# Patient Record
Sex: Female | Born: 1960 | Race: White | Hispanic: No | Marital: Married | Smoking: Never smoker
Health system: Southern US, Community
[De-identification: ages and names within clinical notes are randomized; demographics above are authoritative.]

## PROBLEM LIST (undated history)

## (undated) DIAGNOSIS — M1990 Unspecified osteoarthritis, unspecified site: Secondary | ICD-10-CM

## (undated) DIAGNOSIS — M797 Fibromyalgia: Secondary | ICD-10-CM

## (undated) DIAGNOSIS — R0609 Other forms of dyspnea: Secondary | ICD-10-CM

## (undated) DIAGNOSIS — E785 Hyperlipidemia, unspecified: Secondary | ICD-10-CM

## (undated) DIAGNOSIS — G4733 Obstructive sleep apnea (adult) (pediatric): Secondary | ICD-10-CM

## (undated) DIAGNOSIS — Z9289 Personal history of other medical treatment: Secondary | ICD-10-CM

## (undated) DIAGNOSIS — I4719 Other supraventricular tachycardia: Secondary | ICD-10-CM

## (undated) DIAGNOSIS — I4892 Unspecified atrial flutter: Secondary | ICD-10-CM

## (undated) DIAGNOSIS — T8859XA Other complications of anesthesia, initial encounter: Secondary | ICD-10-CM

## (undated) DIAGNOSIS — I10 Essential (primary) hypertension: Secondary | ICD-10-CM

## (undated) DIAGNOSIS — IMO0002 Reserved for concepts with insufficient information to code with codable children: Secondary | ICD-10-CM

## (undated) DIAGNOSIS — I471 Supraventricular tachycardia: Secondary | ICD-10-CM

## (undated) DIAGNOSIS — Q7962 Hypermobile Ehlers-Danlos syndrome: Secondary | ICD-10-CM

## (undated) DIAGNOSIS — I2510 Atherosclerotic heart disease of native coronary artery without angina pectoris: Secondary | ICD-10-CM

## (undated) DIAGNOSIS — T4145XA Adverse effect of unspecified anesthetic, initial encounter: Secondary | ICD-10-CM

## (undated) DIAGNOSIS — D649 Anemia, unspecified: Secondary | ICD-10-CM

## (undated) HISTORY — PX: EYE SURGERY: SHX253

## (undated) HISTORY — DX: Supraventricular tachycardia: I47.1

## (undated) HISTORY — DX: Reserved for concepts with insufficient information to code with codable children: IMO0002

## (undated) HISTORY — PX: DIAGNOSTIC LAPAROSCOPY: SUR761

## (undated) HISTORY — DX: Obstructive sleep apnea (adult) (pediatric): G47.33

## (undated) HISTORY — DX: Other forms of dyspnea: R06.09

## (undated) HISTORY — PX: KNEE ARTHROPLASTY: SHX992

## (undated) HISTORY — DX: Hyperlipidemia, unspecified: E78.5

## (undated) HISTORY — DX: Unspecified osteoarthritis, unspecified site: M19.90

## (undated) HISTORY — DX: Atherosclerotic heart disease of native coronary artery without angina pectoris: I25.10

## (undated) HISTORY — DX: Other supraventricular tachycardia: I47.19

## (undated) HISTORY — PX: OSTEOTOMY: SHX137

## (undated) HISTORY — DX: Essential (primary) hypertension: I10

## (undated) HISTORY — DX: Hypermobile Ehlers-Danlos syndrome: Q79.62

## (undated) HISTORY — PX: JOINT REPLACEMENT: SHX530

## (undated) HISTORY — PX: BACK SURGERY: SHX140

---

## 1998-08-16 ENCOUNTER — Ambulatory Visit (HOSPITAL_BASED_OUTPATIENT_CLINIC_OR_DEPARTMENT_OTHER): Admission: RE | Admit: 1998-08-16 | Discharge: 1998-08-16 | Payer: Self-pay | Admitting: Orthopedic Surgery

## 1998-10-04 ENCOUNTER — Encounter: Payer: Self-pay | Admitting: Neurosurgery

## 1998-10-04 ENCOUNTER — Ambulatory Visit (HOSPITAL_COMMUNITY): Admission: RE | Admit: 1998-10-04 | Discharge: 1998-10-04 | Payer: Self-pay | Admitting: Neurosurgery

## 1998-10-05 ENCOUNTER — Encounter: Payer: Self-pay | Admitting: Neurosurgery

## 1998-10-10 ENCOUNTER — Encounter: Payer: Self-pay | Admitting: Neurosurgery

## 1998-10-11 ENCOUNTER — Encounter: Payer: Self-pay | Admitting: Neurosurgery

## 1998-10-11 ENCOUNTER — Inpatient Hospital Stay (HOSPITAL_COMMUNITY): Admission: RE | Admit: 1998-10-11 | Discharge: 1998-10-12 | Payer: Self-pay | Admitting: Neurosurgery

## 1999-01-23 ENCOUNTER — Other Ambulatory Visit: Admission: RE | Admit: 1999-01-23 | Discharge: 1999-01-23 | Payer: Self-pay | Admitting: Internal Medicine

## 1999-04-01 ENCOUNTER — Ambulatory Visit (HOSPITAL_COMMUNITY): Admission: RE | Admit: 1999-04-01 | Discharge: 1999-04-01 | Payer: Self-pay | Admitting: Neurosurgery

## 1999-04-01 ENCOUNTER — Encounter: Payer: Self-pay | Admitting: Neurosurgery

## 1999-06-15 ENCOUNTER — Encounter: Payer: Self-pay | Admitting: Neurosurgery

## 1999-06-15 ENCOUNTER — Encounter: Admission: RE | Admit: 1999-06-15 | Discharge: 1999-06-15 | Payer: Self-pay | Admitting: Neurosurgery

## 1999-10-06 ENCOUNTER — Encounter: Admission: RE | Admit: 1999-10-06 | Discharge: 1999-10-27 | Payer: Self-pay | Admitting: *Deleted

## 2000-01-04 ENCOUNTER — Encounter: Payer: Self-pay | Admitting: Rheumatology

## 2000-01-04 ENCOUNTER — Ambulatory Visit (HOSPITAL_COMMUNITY): Admission: RE | Admit: 2000-01-04 | Discharge: 2000-01-04 | Payer: Self-pay | Admitting: *Deleted

## 2000-02-20 ENCOUNTER — Encounter: Payer: Self-pay | Admitting: Obstetrics and Gynecology

## 2000-02-20 ENCOUNTER — Encounter: Admission: RE | Admit: 2000-02-20 | Discharge: 2000-02-20 | Payer: Self-pay | Admitting: Obstetrics and Gynecology

## 2000-02-29 ENCOUNTER — Encounter: Admission: RE | Admit: 2000-02-29 | Discharge: 2000-02-29 | Payer: Self-pay | Admitting: Neurosurgery

## 2000-02-29 ENCOUNTER — Encounter: Payer: Self-pay | Admitting: Neurosurgery

## 2001-03-18 ENCOUNTER — Encounter: Admission: RE | Admit: 2001-03-18 | Discharge: 2001-03-18 | Payer: Self-pay | Admitting: Obstetrics and Gynecology

## 2001-03-18 ENCOUNTER — Encounter: Payer: Self-pay | Admitting: Obstetrics and Gynecology

## 2002-04-03 ENCOUNTER — Encounter: Admission: RE | Admit: 2002-04-03 | Discharge: 2002-04-03 | Payer: Self-pay | Admitting: Obstetrics and Gynecology

## 2002-04-03 ENCOUNTER — Encounter: Payer: Self-pay | Admitting: Obstetrics and Gynecology

## 2002-04-17 ENCOUNTER — Encounter: Payer: Self-pay | Admitting: Rheumatology

## 2002-04-17 ENCOUNTER — Ambulatory Visit (HOSPITAL_COMMUNITY): Admission: RE | Admit: 2002-04-17 | Discharge: 2002-04-17 | Payer: Self-pay | Admitting: Rheumatology

## 2002-05-05 ENCOUNTER — Ambulatory Visit: Admission: RE | Admit: 2002-05-05 | Discharge: 2002-05-05 | Payer: Self-pay | Admitting: Pulmonary Disease

## 2002-05-21 ENCOUNTER — Ambulatory Visit (HOSPITAL_BASED_OUTPATIENT_CLINIC_OR_DEPARTMENT_OTHER): Admission: RE | Admit: 2002-05-21 | Discharge: 2002-05-21 | Payer: Self-pay | Admitting: Obstetrics and Gynecology

## 2002-05-21 ENCOUNTER — Encounter (INDEPENDENT_AMBULATORY_CARE_PROVIDER_SITE_OTHER): Payer: Self-pay | Admitting: Specialist

## 2002-10-13 ENCOUNTER — Ambulatory Visit (HOSPITAL_COMMUNITY): Admission: RE | Admit: 2002-10-13 | Discharge: 2002-10-13 | Payer: Self-pay | Admitting: Neurosurgery

## 2002-10-13 ENCOUNTER — Encounter: Payer: Self-pay | Admitting: Neurosurgery

## 2003-04-07 ENCOUNTER — Encounter: Admission: RE | Admit: 2003-04-07 | Discharge: 2003-04-07 | Payer: Self-pay | Admitting: Obstetrics and Gynecology

## 2003-04-07 ENCOUNTER — Encounter: Payer: Self-pay | Admitting: Obstetrics and Gynecology

## 2003-06-07 ENCOUNTER — Encounter: Admission: RE | Admit: 2003-06-07 | Discharge: 2003-06-07 | Payer: Self-pay | Admitting: Infectious Diseases

## 2003-06-08 ENCOUNTER — Encounter: Admission: RE | Admit: 2003-06-08 | Discharge: 2003-06-08 | Payer: Self-pay | Admitting: Infectious Diseases

## 2003-07-22 ENCOUNTER — Ambulatory Visit (HOSPITAL_COMMUNITY): Admission: RE | Admit: 2003-07-22 | Discharge: 2003-07-22 | Payer: Self-pay

## 2003-08-19 ENCOUNTER — Encounter: Admission: RE | Admit: 2003-08-19 | Discharge: 2003-08-19 | Payer: Self-pay | Admitting: Neurological Surgery

## 2004-01-11 ENCOUNTER — Inpatient Hospital Stay (HOSPITAL_COMMUNITY): Admission: RE | Admit: 2004-01-11 | Discharge: 2004-01-19 | Payer: Self-pay | Admitting: Neurological Surgery

## 2004-05-22 ENCOUNTER — Encounter: Admission: RE | Admit: 2004-05-22 | Discharge: 2004-05-22 | Payer: Self-pay | Admitting: Obstetrics and Gynecology

## 2005-01-15 ENCOUNTER — Inpatient Hospital Stay (HOSPITAL_COMMUNITY): Admission: RE | Admit: 2005-01-15 | Discharge: 2005-01-17 | Payer: Self-pay | Admitting: Orthopedic Surgery

## 2005-02-12 ENCOUNTER — Encounter: Admission: RE | Admit: 2005-02-12 | Discharge: 2005-02-12 | Payer: Self-pay | Admitting: Neurological Surgery

## 2005-03-01 ENCOUNTER — Encounter: Admission: RE | Admit: 2005-03-01 | Discharge: 2005-03-01 | Payer: Self-pay | Admitting: Neurological Surgery

## 2005-05-21 ENCOUNTER — Encounter: Admission: RE | Admit: 2005-05-21 | Discharge: 2005-05-21 | Payer: Self-pay | Admitting: Neurological Surgery

## 2005-06-01 ENCOUNTER — Encounter: Admission: RE | Admit: 2005-06-01 | Discharge: 2005-06-01 | Payer: Self-pay | Admitting: Neurological Surgery

## 2005-06-27 ENCOUNTER — Encounter: Admission: RE | Admit: 2005-06-27 | Discharge: 2005-06-27 | Payer: Self-pay | Admitting: Neurological Surgery

## 2005-07-11 ENCOUNTER — Encounter: Admission: RE | Admit: 2005-07-11 | Discharge: 2005-07-11 | Payer: Self-pay | Admitting: Obstetrics and Gynecology

## 2005-07-19 ENCOUNTER — Encounter: Admission: RE | Admit: 2005-07-19 | Discharge: 2005-07-19 | Payer: Self-pay | Admitting: Neurological Surgery

## 2006-02-11 ENCOUNTER — Encounter: Admission: RE | Admit: 2006-02-11 | Discharge: 2006-02-11 | Payer: Self-pay | Admitting: Orthopedic Surgery

## 2006-02-26 ENCOUNTER — Encounter: Admission: RE | Admit: 2006-02-26 | Discharge: 2006-02-26 | Payer: Self-pay | Admitting: Neurological Surgery

## 2006-05-14 ENCOUNTER — Encounter: Payer: Self-pay | Admitting: Cardiovascular Disease

## 2006-07-23 ENCOUNTER — Encounter: Admission: RE | Admit: 2006-07-23 | Discharge: 2006-07-23 | Payer: Self-pay | Admitting: Obstetrics and Gynecology

## 2006-08-06 ENCOUNTER — Encounter: Admission: RE | Admit: 2006-08-06 | Discharge: 2006-08-06 | Payer: Self-pay | Admitting: Neurological Surgery

## 2007-03-04 ENCOUNTER — Encounter: Admission: RE | Admit: 2007-03-04 | Discharge: 2007-03-04 | Payer: Self-pay | Admitting: Neurological Surgery

## 2007-03-21 ENCOUNTER — Encounter: Admission: RE | Admit: 2007-03-21 | Discharge: 2007-03-21 | Payer: Self-pay | Admitting: Gastroenterology

## 2007-07-18 ENCOUNTER — Ambulatory Visit (HOSPITAL_BASED_OUTPATIENT_CLINIC_OR_DEPARTMENT_OTHER): Admission: RE | Admit: 2007-07-18 | Discharge: 2007-07-18 | Payer: Self-pay | Admitting: Obstetrics and Gynecology

## 2007-07-18 ENCOUNTER — Encounter (INDEPENDENT_AMBULATORY_CARE_PROVIDER_SITE_OTHER): Payer: Self-pay | Admitting: Obstetrics and Gynecology

## 2007-08-07 ENCOUNTER — Encounter: Admission: RE | Admit: 2007-08-07 | Discharge: 2007-08-07 | Payer: Self-pay | Admitting: Obstetrics and Gynecology

## 2007-12-02 ENCOUNTER — Encounter: Admission: RE | Admit: 2007-12-02 | Discharge: 2007-12-02 | Payer: Self-pay | Admitting: Neurological Surgery

## 2007-12-26 ENCOUNTER — Encounter: Admission: RE | Admit: 2007-12-26 | Discharge: 2007-12-26 | Payer: Self-pay | Admitting: Neurological Surgery

## 2008-01-16 ENCOUNTER — Encounter: Admission: RE | Admit: 2008-01-16 | Discharge: 2008-01-16 | Payer: Self-pay | Admitting: Neurological Surgery

## 2008-02-18 ENCOUNTER — Encounter: Admission: RE | Admit: 2008-02-18 | Discharge: 2008-02-18 | Payer: Self-pay | Admitting: Neurological Surgery

## 2008-06-11 ENCOUNTER — Encounter: Admission: RE | Admit: 2008-06-11 | Discharge: 2008-06-11 | Payer: Self-pay | Admitting: Neurological Surgery

## 2008-08-20 ENCOUNTER — Encounter: Admission: RE | Admit: 2008-08-20 | Discharge: 2008-08-20 | Payer: Self-pay | Admitting: Obstetrics and Gynecology

## 2008-10-17 ENCOUNTER — Emergency Department (HOSPITAL_COMMUNITY): Admission: EM | Admit: 2008-10-17 | Discharge: 2008-10-17 | Payer: Self-pay | Admitting: Emergency Medicine

## 2008-11-05 ENCOUNTER — Ambulatory Visit (HOSPITAL_COMMUNITY): Admission: RE | Admit: 2008-11-05 | Discharge: 2008-11-05 | Payer: Self-pay | Admitting: Gastroenterology

## 2009-01-18 ENCOUNTER — Encounter: Admission: RE | Admit: 2009-01-18 | Discharge: 2009-01-18 | Payer: Self-pay | Admitting: Orthopedic Surgery

## 2009-08-25 ENCOUNTER — Encounter: Admission: RE | Admit: 2009-08-25 | Discharge: 2009-08-25 | Payer: Self-pay | Admitting: Obstetrics and Gynecology

## 2010-07-29 ENCOUNTER — Encounter: Payer: Self-pay | Admitting: Neurological Surgery

## 2010-07-30 ENCOUNTER — Encounter: Payer: Self-pay | Admitting: Diagnostic Radiology

## 2010-07-30 ENCOUNTER — Encounter: Payer: Self-pay | Admitting: Obstetrics and Gynecology

## 2010-07-30 ENCOUNTER — Encounter: Payer: Self-pay | Admitting: Orthopedic Surgery

## 2010-07-30 ENCOUNTER — Encounter: Payer: Self-pay | Admitting: Neurological Surgery

## 2010-09-21 ENCOUNTER — Other Ambulatory Visit: Payer: Self-pay | Admitting: Obstetrics and Gynecology

## 2010-09-21 DIAGNOSIS — Z1231 Encounter for screening mammogram for malignant neoplasm of breast: Secondary | ICD-10-CM

## 2010-09-29 ENCOUNTER — Ambulatory Visit: Payer: Self-pay

## 2010-10-06 ENCOUNTER — Ambulatory Visit
Admission: RE | Admit: 2010-10-06 | Discharge: 2010-10-06 | Disposition: A | Discharge status: Home / Self Care | Payer: BC Managed Care – PPO | Source: Ambulatory Visit | Attending: Obstetrics and Gynecology | Admitting: Obstetrics and Gynecology

## 2010-10-06 DIAGNOSIS — Z1231 Encounter for screening mammogram for malignant neoplasm of breast: Secondary | ICD-10-CM

## 2010-10-11 ENCOUNTER — Ambulatory Visit: Payer: Self-pay | Admitting: Sports Medicine

## 2010-10-11 ENCOUNTER — Other Ambulatory Visit: Payer: Self-pay | Admitting: Obstetrics and Gynecology

## 2010-10-11 DIAGNOSIS — R928 Other abnormal and inconclusive findings on diagnostic imaging of breast: Secondary | ICD-10-CM

## 2010-10-12 ENCOUNTER — Encounter: Payer: Self-pay | Admitting: Sports Medicine

## 2010-10-12 ENCOUNTER — Ambulatory Visit
Admission: RE | Admit: 2010-10-12 | Discharge: 2010-10-12 | Disposition: A | Discharge status: Home / Self Care | Payer: BC Managed Care – PPO | Source: Ambulatory Visit | Attending: Obstetrics and Gynecology | Admitting: Obstetrics and Gynecology

## 2010-10-12 ENCOUNTER — Ambulatory Visit (INDEPENDENT_AMBULATORY_CARE_PROVIDER_SITE_OTHER): Payer: BC Managed Care – PPO | Admitting: Sports Medicine

## 2010-10-12 VITALS — BP 129/81 | Ht 66.0 in | Wt 130.0 lb

## 2010-10-12 DIAGNOSIS — R928 Other abnormal and inconclusive findings on diagnostic imaging of breast: Secondary | ICD-10-CM

## 2010-10-12 DIAGNOSIS — M7711 Lateral epicondylitis, right elbow: Secondary | ICD-10-CM | POA: Insufficient documentation

## 2010-10-12 DIAGNOSIS — M25521 Pain in right elbow: Secondary | ICD-10-CM

## 2010-10-12 DIAGNOSIS — M25529 Pain in unspecified elbow: Secondary | ICD-10-CM

## 2010-10-12 DIAGNOSIS — M159 Polyosteoarthritis, unspecified: Secondary | ICD-10-CM | POA: Insufficient documentation

## 2010-10-12 DIAGNOSIS — M7710 Lateral epicondylitis, unspecified elbow: Secondary | ICD-10-CM

## 2010-10-12 MED ORDER — NITROGLYCERIN 0.2 MG/HR TD PT24: MEDICATED_PATCH | TRANSDERMAL | Status: DC

## 2010-10-12 NOTE — Assessment & Plan Note (Signed)
Patient was given a right elbow compression sleeve that made her lateral epicondyles feel more comfortable. In addition she is an excellent Candidate for nitroglycerin therapy.  We will begin with a quarter of a patch of nitroglycerin daily - usel 24 hours and then replaced the next day.  I will ask her to return in 4 weeks and repeat the ultrasound. The key change in the first 4-6 weeks should be the amount of Doppler activity.  She was given gentle eccentric exercises to do for her wrist extensor tendons.  Carbon copy of this to Dr. Teressa Senter

## 2010-10-12 NOTE — Progress Notes (Signed)
  Subjective:    Patient ID: Nicole Oneill, female    DOB: Jul 01, 1961, 50 y.o.   MRN: [id number]  HPI Referred courtesy of Dr Teressa Senter who felt she might be a candidate for NTG therapy for her tennis elbow type injury  Here for RT elbow pain x 10 mos Started p IPAD purchased No specific injury. Triggered the elbow pain. Dr Marylene Buerger has worked on this with ART which helped forearm spasm  Patient has had generalized DJD since age 20 First knee surgery meniscus RT at age 86 Tibial osteotomies in 2008 Hemi knee left in 2005 by Dr Despina Hick  On Monday she had both CMC joints injected by Dr Teressa Senter Neg elbow xray at Dr syphers  Back surgery multiple levels at [facility] - lumbar spinal stenosis  cervical fusion Dr Amada Jupiter later fused C5-7 p this one failed L3-5 fused elsner 2009 failed again and Dr Theodora Blow and refused L2 to S1  Carpal tunnel injection on left 2 mos ago   Review of Systems Carries a diagnosis of Sjogren's     Objective:   Physical Exam Pleasant female in no acute distress.  Right elbow shows full range of motion. There is direct tenderness over the insertion of the extensor tendons into the lateral epicondyle. There is no visible swelling in this area. The triceps tendon seems nontender. She is unable to hold a book with her arm extended without pain she can do this easily with the left arm however Resisted extension of the right wrist or the right fingers also brings out the pain.  [facility] ultrasound After insertion of the tendons into the tip of the lateral epicondyle there are some small avulsion fragments and calcification. In the extensor tendons itself there is some hyperechoic change and what looks like a linear split of the tendon. This is associated with a marked increase in neo-vessels. Longitudinal and transverse views confirm the findings.       Assessment & Plan:

## 2010-10-12 NOTE — Assessment & Plan Note (Signed)
She is followed by a number of physicians for her multiple problems with osteoarthritis. These involve the cervical and lumbar spine. She is status post fusions in both of these areas.  Dr. Teressa Senter sees her for her arthritis in the hands and particularly at the [facility] joints. She has shown excellent improvement with the recent injections given to these joints. Of interest is that this lessened her pain in her right elbow.  She has bilateral knee osteoarthritis and is followed at [facility] for that at the present time.

## 2010-10-18 LAB — URINALYSIS, ROUTINE W REFLEX MICROSCOPIC
Bilirubin Urine: NEGATIVE
Bilirubin Urine: NEGATIVE
Glucose, UA: NEGATIVE mg/dL
Glucose, UA: NEGATIVE mg/dL
Hgb urine dipstick: NEGATIVE
Hgb urine dipstick: NEGATIVE
Ketones, ur: NEGATIVE mg/dL
Ketones, ur: NEGATIVE mg/dL
Nitrite: NEGATIVE
Nitrite: NEGATIVE
Protein, ur: NEGATIVE mg/dL
Protein, ur: NEGATIVE mg/dL
Specific Gravity, Urine: 1.008 (ref 1.005–1.030)
Specific Gravity, Urine: 1.017 (ref 1.005–1.030)
Urobilinogen, UA: 0.2 mg/dL (ref 0.0–1.0)
Urobilinogen, UA: 0.2 mg/dL (ref 0.0–1.0)
pH: 6.5 (ref 5.0–8.0)
pH: 6.5 (ref 5.0–8.0)

## 2010-10-18 LAB — LIPASE, BLOOD: Lipase: 35 U/L (ref 11–59)

## 2010-10-18 LAB — COMPREHENSIVE METABOLIC PANEL
ALT: 14 U/L (ref 0–35)
AST: 17 U/L (ref 0–37)
Albumin: 3.5 g/dL (ref 3.5–5.2)
Alkaline Phosphatase: 34 U/L — ABNORMAL LOW (ref 39–117)
BUN: 15 mg/dL (ref 6–23)
CO2: 28 mEq/L (ref 19–32)
Calcium: 9.1 mg/dL (ref 8.4–10.5)
Chloride: 105 mEq/L (ref 96–112)
Creatinine, Ser: 0.72 mg/dL (ref 0.4–1.2)
GFR calc Af Amer: >60 mL/min (ref >60)
GFR calc non Af Amer: >60 mL/min (ref >60)
Glucose, Bld: 93 mg/dL (ref 70–99)
Potassium: 3.8 mEq/L (ref 3.5–5.1)
Sodium: 139 mEq/L (ref 135–145)
Total Bilirubin: 0.4 mg/dL (ref 0.3–1.2)
Total Protein: 6.2 g/dL (ref 6.0–8.3)

## 2010-10-18 LAB — URINE MICROSCOPIC-ADD ON

## 2010-10-18 LAB — DIFFERENTIAL
Basophils Absolute: 0 10*3/uL (ref 0.0–0.1)
Basophils Relative: 0 % (ref 0–1)
Eosinophils Absolute: 0.1 10*3/uL (ref 0.0–0.7)
Eosinophils Relative: 2 % (ref 0–5)
Lymphocytes Relative: 33 % (ref 12–46)
Lymphs Abs: 1.4 10*3/uL (ref 0.7–4.0)
Monocytes Absolute: 0.4 10*3/uL (ref 0.1–1.0)
Monocytes Relative: 9 % (ref 3–12)
Neutro Abs: 2.4 10*3/uL (ref 1.7–7.7)
Neutrophils Relative %: 56 % (ref 43–77)

## 2010-10-18 LAB — CBC
HCT: 33.2 % — ABNORMAL LOW (ref 36.0–46.0)
Hemoglobin: 11.5 g/dL — ABNORMAL LOW (ref 12.0–15.0)
MCHC: 34.6 g/dL (ref 30.0–36.0)
MCV: 87.9 fL (ref 78.0–100.0)
Platelets: 179 10*3/uL (ref 150–400)
RBC: 3.78 MIL/uL — ABNORMAL LOW (ref 3.87–5.11)
RDW: 12.2 % (ref 11.5–15.5)
WBC: 4.3 10*3/uL (ref 4.0–10.5)

## 2010-11-21 ENCOUNTER — Other Ambulatory Visit: Payer: Self-pay | Admitting: Orthopedic Surgery

## 2010-11-21 DIAGNOSIS — M545 Low back pain, unspecified: Secondary | ICD-10-CM

## 2010-11-21 NOTE — Op Note (Signed)
NAME:  Nicole Oneill, Nicole Oneill                  ACCOUNT NO.:  [id number]   MEDICAL RECORD NO.:  [id number]          PATIENT TYPE:  AMB   LOCATION:  [facility]                         FACILITY:  [facility]   PHYSICIAN:  Jamison Neighbor, M.D.  DATE OF BIRTH:  [birth year]   DATE OF PROCEDURE:  07/18/2007  DATE OF DISCHARGE:                               OPERATIVE REPORT   SERVICE:  Urology.   PREOPERATIVE DIAGNOSIS:  Chronic pelvic pain, possible interstitial  cystitis.   POSTOPERATIVE DIAGNOSIS:  Chronic pelvic pain, possible interstitial  cystitis.   PROCEDURE:  1. Cystoscopy.  2. Urethral calibration.  3. Hydrodistention of the bladder.  4. Marcaine and Pyridium installation.  5. Marcaine and Kenalog injection.  6. Bladder biopsy.   SECONDARY PROCEDURE:  Laparoscopy with fulguration of endometriosis by  Dr. Marcelle Overlie.   ANESTHESIA:  General.   COMPLICATIONS:  None.   DRAIN:  A 16-French Foley catheter to a leg bag.   HISTORY:  This 50 year old female has had longstanding problems with  urgency and frequency as well as some question of pelvic pain.  The  etiology of this remains unclear, as there was felt to be some evidence  of endometriosis as well as a possible evidence of interstitial  cystitis.   Another urologist saw her and felt that she had primarily overactive  bladder.  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  The patient  does have a history of fibromyalgia and Sjogren's syndrome, which raises  the index of suspicion for interstitial cystitis.  She is now undergoing  diagnostic laparoscopy for treatment of her endometriosis and will have  a simultaneous cystoscopy and hydrodistention to determine if she has  evidence of interstitial cystitis.  She understands that this is a  diagnostic test, but it may have some therapeutic  value, but there is no  guarantee that she will have any improvement in her symptoms if she does  have evidence of IC.  Full informed consent was obtained.   PROCEDURE:  After a successful induction of general anesthesia, the  patient was placed in the dorsal lithotomy position, prepped with  Betadine and draped in the usual sterile fashion.  Careful bimanual  examination revealed very minimal cystocele, reasonably good support for  her uterus and no rectocele or enterocele.  There were no masses on  bimanual exam.  The urethra was palpably normal without evidence of  stenosis or stricture.  The urethra was calibrated to 32-French with  female urethral sounds.  Cystoscopy was performed and the bladder was  carefully inspected; no tumors or stones could be seen.  Both ureteral  orifices were normal in configuration and location.  No squamous  metaplasia or other irregularities could be identified.   Hydrodistention of bladder was performed.  The bladder was distended at  a pressure of 100 cmH2O for 5 minutes.  When the bladder was drained,  there was minimal bleeding at the termination  of the drain-out cycle.  The patient had very minimal granulations and she had a bladder capacity  that was normal at 1350 mL; this compares to the average IC capacity of  1150 as determined by previous studies and is much better than the  average IC bladder capacity of 575.  The glomerulation was felt to be  fairly nonspecific and these findings would certainly suggest that the  patient does not have any classic form of interstitial cystitis.  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  The patient underwent bladder biopsy to  rule out carcinoma in situ and also for mast cell analysis.  The biopsy  site was cauterized.  The bladder was drained.  A mixture of Marcaine  and Pyridium  was left in the bladder.  Marcaine and Kenalog mixture was  injected periurethral for a pudendal block.  The patient tolerated the  procedure well.  She was sent to the recovery room with a Foley catheter  that was plugged; this will be opened up after approximately 1/2 hour.  The patient will be sent home with the Foley catheter in place; this was  done at her request because she knows that 14 previous times when she  had undergone surgical intervention, she has had urinary retention.  The  patient will be sent home with pain medication from Dr. Vincente Poli as well  as Pyridium Plus for a postoperative symptomatology in the bladder and  doxycycline for antibiotic coverage.  When the patient returns, we will  determine how best to treat her, based on her response.  If the patient  has no change whatsoever in her urinary symptoms with the  hydrodistention and if the biopsy does not show mass cells, we will most  likely treat this patient simply as a dysfunctional voiding patient with  biofeedback physical therapy and medications for the pelvic floor and  bladder.  If on the other hand she has any change in her symptoms  secondary to the hydrodistention, we may treat her as an IC patient,  even though her bladder capacity is normal.      Jamison Neighbor, M.D.  Electronically Signed     RJE/MEDQ  D:  07/18/2007  T:  07/18/2007  Job:  [id number]   cc:   Marcelino Duster L. Vincente Poli, M.D.  Fax: [phone]

## 2010-11-21 NOTE — Op Note (Signed)
NAME:  Nicole Oneill, Nicole Oneill                  ACCOUNT NO.:  [id number]   MEDICAL RECORD NO.:  [id number]          PATIENT TYPE:  AMB   LOCATION:  [facility]                         FACILITY:  [facility]   PHYSICIAN:  Michelle L. Grewal, M.D.DATE OF BIRTH:  [birth year]   DATE OF PROCEDURE:  07/18/2007  DATE OF DISCHARGE:                               OPERATIVE REPORT   PREOPERATIVE DIAGNOSIS:  Pelvic pain, endometriosis.   POSTOPERATIVE DIAGNOSIS:  Pelvic pain, endometriosis involving both  ovaries.   PROCEDURE:  Diagnostic laparoscopy, fulguration of endometriosis.   ANESTHESIA:  General.   FINDINGS:  Endometriosis involving both ovaries, one small brownish spot  in the right lower quadrant near the cecum.   DESCRIPTION OF PROCEDURE:  The patient was taken to the operating room.  She was intubated without difficulty.  She was prepped and draped in the  usual sterile fashion and catheter was used to empty the bladder.  A  uterine manipulator was inserted.  Attention was turned to the abdomen  and a small infraumbilical incision was made after local was  infiltrated.  The Veress needle was inserted and pneumoperitoneum was  performed.  The 11 mm trocar was inserted.  The patient was placed in  Trendelenburg position.  Under direct visualization using the  laparoscope a 5 mm trocar was inserted suprapubically.  I then performed  a thorough exploration of the pelvis.  The liver appeared normal.  Gallbladder normal.  She had one small hemosiderin brownish spot just  lateral to the liver but it was too close to the liver for me to  fulgurate.  Also, her cecum and colon appeared normal, however, I had to  retract immediately to visualize the appendix and the appendix did not  appear inflamed but there appeared to be maybe some small adhesions  around the base of the appendix.  Sometimes we can see this with  endometriosis.  I saw no obvious signs of endometriosis, and the patient  had no bowel prep prior to  this procedure, and was not consented for  laparoscopic appendectomy.  She is currently asymptomatic in that  regard.  I would recommend for the patient that if she needed further  surgery in the future, such as hysterectomy, she would need an  appendectomy performed by a general surgeon.  This will be discussed  with the patient and her family after surgery.  She did have one small  brownish hemosiderin deposit just lateral on the abdominal wall and I  did burn that.  I then went down to the pelvis.  The bladder flap was  clean.  The uterus was slightly enlarged consistent with adenomyosis.  There were no adhesions in the cul-de-sac but there was one small  peritoneal window in the cul-de-sac on the patient's right side.  Her  fallopian tubes appeared normal.  Both ovaries, however, were scattered  with about 8 to 9 spots of endometriosis on the surface.  There was no  endometriotic implants visualized in the peritoneum beneath the ovaries.  I used the Kleppinger's and burned all the visualized spots.  I felt  her  disease was very limited to the surface of the ovaries and felt  oophorectomy would not be necessary and the patient only wanted to have  oophorectomy if her disease was severe.  At this point I completed the  surgery and the pneumoperitoneum was released.  The trocars  were removed.  Incisions were closed with Dermabond skin adhesive.  The  uterine manipulator was removed.  I then scrubbed out and Dr. Logan Bores then  scrubbed in.  I discussed with him briefly the findings of my surgery  and he will perform his portion of the surgery, which will be dictated  by him.      Michelle L. Vincente Poli, M.D.  Electronically Signed     MLG/MEDQ  D:  07/18/2007  T:  07/18/2007  Job:  [id number]   cc:   Jamison Neighbor, M.D.

## 2010-11-23 ENCOUNTER — Encounter: Payer: Self-pay | Admitting: Sports Medicine

## 2010-11-23 ENCOUNTER — Ambulatory Visit (INDEPENDENT_AMBULATORY_CARE_PROVIDER_SITE_OTHER): Payer: BC Managed Care – PPO | Admitting: Sports Medicine

## 2010-11-23 VITALS — BP 125/82

## 2010-11-23 DIAGNOSIS — M7710 Lateral epicondylitis, unspecified elbow: Secondary | ICD-10-CM

## 2010-11-23 DIAGNOSIS — M159 Polyosteoarthritis, unspecified: Secondary | ICD-10-CM

## 2010-11-23 DIAGNOSIS — M7711 Lateral epicondylitis, right elbow: Secondary | ICD-10-CM

## 2010-11-23 NOTE — Assessment & Plan Note (Signed)
Musculoskeletal ultrasound Scanning of the Lateral epicondyle today reveals improved appearance of the tendon over all. There is less hypoechoic change. There continues to be a very small avulsion fragment at the very tip of the epicondyle. In the area where she had some tendon splitting and neovessel activity she now has essentially normal Doppler flow and there is less hypoechoic change at the tendon.  The patient is showing dramatic improvement clinically and by scanning with her epicondylitis. With this in mind I still think she should continue the nitroglycerin patches for an additional 6 weeks since all the studies were based on 12 weeks of treatment. Since exercises with any weight hurt she can keep doing motion exercises for wrist extension flexion and rotation and some gentle ball squeezes to try to strengthen the muscles around the epicondyle.   I will recheck this in 6-8 weeks and if continue to progress can release her at that time.  Cc: Josephine Igo.

## 2010-11-23 NOTE — Assessment & Plan Note (Signed)
I did a quick ultrasound scan of her CMC joints today. There is less fluid and less calcification noted than on her last visit. I encouraged her to continue her followup with Dr. Teressa Senter for this as it seems that both in terms of symptoms and based on appearance on ultrasound she did get a very good response to injection.

## 2010-11-23 NOTE — Progress Notes (Signed)
  Subjective:    Patient ID: Nicole Oneill, female    DOB: [birth year], 50 y.o.   MRN: [id number]  HPI  Patient was referred to me by Dr. Teressa Senter for possible nitroglycerin treatment for her right tennis elbow. Last visit we scanned this and found some changes on ultrasound that were very consistent with tennis elbow. She started using the nitroglycerin patches. She states she had some pain relief within 10 days. She now feels that she is at least 75% better and wonders if she needs the patches any longer.  She does find that the exercises are hard for her to do primarily because of some pain in her CMC joints and her wrists. Her CMC joints are much better since Dr. Teressa Senter injected them 6 weeks ago.  Review of Systems     Objective:   Physical Exam The patient is in no acute distress She has only mild tenderness to palpation at the tip of the lateral epicondyle  there is mild pain to resisted wrist extension Minimal pain to resisted finger extension She was able to hold a book in full extension with some tightness and mild pain.  Noted that she has some squaring of both first CMC joints but these are much less tender today       Assessment & Plan:

## 2010-11-24 ENCOUNTER — Ambulatory Visit
Admission: RE | Admit: 2010-11-24 | Discharge: 2010-11-24 | Disposition: A | Discharge status: Home / Self Care | Payer: BC Managed Care – PPO | Source: Ambulatory Visit | Attending: Orthopedic Surgery | Admitting: Orthopedic Surgery

## 2010-11-24 DIAGNOSIS — M545 Low back pain, unspecified: Secondary | ICD-10-CM

## 2010-11-24 NOTE — Discharge Summary (Signed)
NAME:  Nicole Oneill, Nicole Oneill NO.:  [id number]   MEDICAL RECORD NO.:  [id number]                   PATIENT TYPE:  INP   LOCATION:  3010                                 FACILITY:  [facility]   PHYSICIAN:  Stefani Dama, M.D.               DATE OF BIRTH:  03/07/1961   DATE OF ADMISSION:  01/11/2004  DATE OF DISCHARGE:  01/19/2004                                 DISCHARGE SUMMARY   ADMISSION DIAGNOSES:  1. Pseudoarthrosis C6, C7, left cervical radiculopathy.  2. Lumbar spondylosis and stenosis with recurrent herniated nucleus     pulposus, L4, L5.  Lumbar radiculopathy status post diskitis L4, L5.   DISCHARGE/FINAL DIAGNOSES:  1. Pseudoarthrosis C6, C7, left cervical radiculopathy.  2. Lumbar spondylosis and stenosis with recurrent herniated nucleus     pulposus, L4, L5.  Lumbar radiculopathy status post diskitis L4, L5.  3. Postoperative anemia.  4. Severe postoperative constipation.   CONDITION ON DISCHARGE:  Improving.   HOSPITAL COURSE:  [name] is a 50 year old individual who has had  significant back pain and bilateral leg pain.  She has had a minimally  invasive decompressive procedure at L4-5 about a year ago.  Several years  before that she has had an anterior cervical decompression arthrodesis.  She  did well for a while from her neck but developed significant neck, shoulder  and left arm pain and was found to have a pseudoarthrosis at the C6, C7  level.  In the meantime after her minimally invasive decompression she had  persistent pain and she developed a diskitis several weeks later.  She was  treated with a long course of IV antibiotics and gradually this resolved  however.  Pain in the back of both legs in her lower back did not resolve.  She was evaluated with a myelogram and was found to have a severe stenosis  at the L4, L5 level.  L3, L4 was also moderately stenotic.  After careful  consideration of the patient's options she was  advised regarding surgical  decompression and stabilization from L3 to L5 in her lumbar spine, and  revision of the arthrodesis at the C6, C7 via posterior supplementation.  She is now admitted to the hospital to undergo these procedures.  Postoperatively she seemed to tolerate the surgery well, however, during the  postoperative course it was noted that her blood sugars appeared to be  somewhat increased in the 160 to 170 range.  She remained tachycardic and a  CBC demonstrated that her hemoglobin decreased to 7.1 on the second  postoperative day; her hematocrit was 21.  She was feeling weak and  nauseated, in general pain was difficult to control with substantial doses  of parenteral pain medications.  She was transfused 2 units of blood on the  evening of January 13, 2004.  On January 14, 2004 the patient was still having  substantial difficulties with adequate pain control and was receiving quite  strong doses of Dilaudid.  Results are noted on the evening of January 14, 2004  that she was hyponatremic with a sodium of 127, a potassium of 3.9, her  glucose remained elevated at 149.  On January 15, 2004 consultation was obtained  with the Hospitalists to evaluation her hyponatremia.  It was felt that this  was due basically to fluid overload.  Her hemoglobin A1C returned as normal  at 5.3.  It is not felt that she was diabetic and the sugars came under good  spontaneous control.  As time passed her pain came under better control, it  was felt that this was related to fibromyalgia syndrome from which she  suffers.  The patient was maintained on oral pain medications and on January 16, 2004 it was noted that her speech was becoming more fluent, she was  becoming more appropriate.  However the patient's bowels had not moved.  On  January 17, 2004 ultimately she was treated with further strong laxatives and  ultimately required enema and disimpaction.  Her bowels resumed normal  function and by January 18, 2004 she  was feeling considerably better.  She was  maintained on IV antibiotics during the periprocedural for a total of 6  days.  Her intravenous  fluids were discontinued on January 17, 2004.  At the  current time the patient is tolerating ambulation with use of a walker; her  incisions are clean and dry in the neck and in the low back.  She will be  discharged home at the current time to be followed up on an outpatient  basis.   DISCHARGE MEDICATIONS:  1. She is given a prescription for Dilaudid 2 mg #60.  2. Valium 5 mg #40.  3. Flexeril to use as a muscle relaxant.   FOLLOW UP:  I will see her in two week's time to check her progress.                                                Stefani Dama, M.D.    Nicole Oneill  D:  01/18/2004  T:  01/19/2004  Job:  [id number]

## 2010-11-24 NOTE — H&P (Signed)
NAME:  Nicole Oneill, Nicole Oneill                  ACCOUNT NO.:  [id number]   MEDICAL RECORD NO.:  [id number]          PATIENT TYPE:  INP   LOCATION:  NA                           FACILITY:  [facility]   PHYSICIAN:  Ollen Gross, M.D.    DATE OF BIRTH:  [birth year]   DATE OF ADMISSION:  07/03/2006  DATE OF DISCHARGE:                              HISTORY & PHYSICAL   CHIEF COMPLAINT:  Left knee pain.   HISTORY OF PRESENT ILLNESS:  The patient is a 50 year old female, well  known to Dr. Ollen Gross and had previously undergone a left uni-knee  arthroplasty back in 2006.  Unfortunately, she has had problems with the  knee lately with increasing pain.  She has had a bone scan which is  positive for either loosening or possible stress reaction osteonecrosis  on the medial tibial.  There was concern that she was having reaction  there with increased pain.  It is felt she would best be served by  conversion over from a left uni-knee over to a left total knee.  The  risks and benefits discussed.  The patient subsequently admitted to the  hospital.   ALLERGIES:  NO KNOWN DRUG ALLERGIES.   INTOLERANCES:  Morphine causes sickness.   CURRENT MEDICATIONS:  Ambien CR, Zoloft, Zanaflex, Protonix, Mobic,  Chromagen, multivitamin, iron supplement, glucosamine/chondroitin focus  formula, calcium, and baby aspirin.   PAST MEDICAL HISTORY:  1. Sjogren's disease.  2. Fibromyalgia.  3. 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  4. Heart murmur.  5. History of constipation, requiring disimpaction in the past.  6. Reflux.  7. History of urinary retention.  8. Hyperglycemia.  9. History of anemia.  10.Mild scoliosis.  11.Cervical degenerative disk disease, lumbar degenerative disk      disease.  12.History of disk herniation L5-S1 requiring ESIs.  13.History of endometriosis.   PAST SURGICAL HISTORY:  1. Left uni-knee replacement, 2006.  2. Laparotomy secondary to  endometriosis, January 1996.  3. Right knee arthroscopy, 1998.  4. Left knee surgery, February 2000.  5. Cervical fusion, April 2000.  6. Left surgery microfracture in 2002.  7. Abdominal laparoscopy for endometriosis, November 2003.  8. Lumbar disk surgery, L3-L5, June 2004, with post-op staph infection      diskitis two weeks later.  9. Cervical fusion redo, C5-C7 and also lumbar surgery L3-L5, July      2005.   SOCIAL HISTORY:  Married, housewife, nonsmoker, 3-4 glasses per week, 3  children.   FAMILY HISTORY:  Father with a history of cancer who was deceased at age  82.  Mother with a history of heart disease.   REVIEW OF SYSTEMS:  GENERAL:  She had a recent low-grade fever,  questionable viral infection.  No chills or night sweats.  NEURO:  History of some sciatica, radicular pain from her previous back  surgeries, also fibromyalgia.  No seizures or syncope.  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  No  shortness of breath, productive cough, or hemoptysis.  CARDIOVASCULAR:  No chest pain, angina, orthopnea.  She does have a heart murmur.  GI:  No nausea, vomiting, diarrhea, or constipation.  GU:  No dysuria,  hematuria, discharge.  She does have a history of urinary retention.  MUSCULOSKELETAL:  Left knee.   PHYSICAL EXAMINATION:  VITAL SIGNS:  Pulse 80, respirations 14, blood  pressure 102/66.  GENERAL:  A 50 year old, white female, tall, thin framed, alert,  oriented, cooperative, a good historian.  HEENT:  Normocephalic, atraumatic.  Pupils round and reactive.  Oropharynx clear.  EOMs intact.  NECK:  Supple.  No carotid bruits.  CHEST:  Clear anterior posterior chest walls.  No rhonchi, rales, or  wheezing.  HEART:  Regular in rate and rhythm.  S1 and S2 noted.  ABDOMEN:  Soft, flat, and nontender.  Bowel sounds present.  RECTAL/BREASTS/GENITALIA:  Not done.  Not pertinent to present illness.  EXTREMITIES:  Left knee:  Range of  motion is 0-140, tender medially, no  effusion.   IMPRESSION:  Failed left uni-knee.   PLAN:  The patient will be admitted to [facility] to undergo  conversion of her left uni-knee over to a total knee replacement.  The  surgery will be performed by Dr. Ollen Gross.      Alexzandrew L. Julien Girt, P.A.      Ollen Gross, M.D.  Electronically Signed    ALP/MEDQ  D:  07/02/2006  T:  07/02/2006  Job:  [id number]   cc:   [name], M.D.  Fax: [phone]

## 2011-01-24 ENCOUNTER — Ambulatory Visit: Payer: BC Managed Care – PPO | Admitting: Sports Medicine

## 2011-02-05 ENCOUNTER — Ambulatory Visit (INDEPENDENT_AMBULATORY_CARE_PROVIDER_SITE_OTHER): Payer: BC Managed Care – PPO | Admitting: Sports Medicine

## 2011-02-05 VITALS — BP 105/72 | Ht 66.0 in | Wt 130.0 lb

## 2011-02-05 DIAGNOSIS — M722 Plantar fascial fibromatosis: Secondary | ICD-10-CM | POA: Insufficient documentation

## 2011-02-05 DIAGNOSIS — M159 Polyosteoarthritis, unspecified: Secondary | ICD-10-CM

## 2011-02-05 NOTE — Patient Instructions (Signed)
1. Do toe walks and backward walks without waits across the room 10 times daily.    2. Do theraband flexion and extension, 10 repititions daily.  3. Do foot stretch and massage.  4. Use the arch strap on the right foot daily.  5. Ice heel at the end of the day for 10 minutes.  6. Follow up after your knee replacement if you still have foot pain.

## 2011-02-05 NOTE — Assessment & Plan Note (Signed)
Currently her pain is in the right thumb.  The last injection did not improve her pain.  [facility] has no other therapies to offer.  She is referred to her PCM for a work up to investigate a possible joining etiology for her extensive arthritis.  She can try oral analgesics PRN for her pain.

## 2011-02-05 NOTE — Assessment & Plan Note (Addendum)
She will start with strengthening and stretching exercises and wear an arch support strap on the right.  If this does not improve her pain she will follow up after her surgeries are complete.  Wear shoes that have good our support whenever possible.  I would not consider making orthotics until after her knee replacement is complete as this might change her gait somewhat

## 2011-02-05 NOTE — Progress Notes (Signed)
  Subjective:    Patient ID: Nicole Oneill, female    DOB: 04-May-1961, 50 y.o.   MRN: [id number]  HPI 50 y/o female is here c/o bilateral foot pain R>L.  The left foot pain is worse with the first step of the day and resolves during the morning hours.  The right foot is also worse with the first step of the day, improves over the course of the day, but does not resolve.  She has had no injuries.  She notices that her sandals with midfoot support are helpful  She has extensive arthritis in multiple joints and is currently scheduled for a left knee replacement and a lumbar fusion.  She recently had an injection into the base of the thumb for pain and still has pain in that joint today.     Review of Systems     Objective:   Physical Exam  Right foot: Normal inspection with no visable or palpable fat pad atrophy and no visible swelling/erythema. Patient is tender at medial insertion of plantar fascia into calcaneus. Great toe motion: normal Arch shape: normal Other foot breakdown: Morton's foot, second toe longer than the great toe   Left foot: Normal inspection with no visable or palpable fat pad atrophy and no visible swelling/erythema. Patient is tender at medial insertion of plantar fascia into calcaneus. Great toe motion: normal Arch shape: some loss Other foot breakdown:Morton's foot, second toe longer than the great toe  Right hand: Atrophy of the thenar eminence Squaring of the CMC joint Subluxation of the MCP joint  Ultrasound:  Right foot: Plantar fascia and achilles tendon visualized in the long view.  Other than thickening of the plantar fascia no abnormalities were noted. Plantar fascia 0.6cm Achilles tendon 0.4cmi  Left foot: Normal appearing plantar fascia in the long view.  Plantar fascia 0.35cm  Right hand:  MCP joint shows significant arthritic changes       Assessment & Plan:   No problem-specific assessment & plan notes found for this encounter.

## 2011-03-16 ENCOUNTER — Other Ambulatory Visit: Payer: Self-pay | Admitting: Gastroenterology

## 2011-03-16 ENCOUNTER — Ambulatory Visit
Admission: RE | Admit: 2011-03-16 | Discharge: 2011-03-16 | Disposition: A | Discharge status: Home / Self Care | Payer: BC Managed Care – PPO | Source: Ambulatory Visit | Attending: Gastroenterology | Admitting: Gastroenterology

## 2011-03-29 LAB — CBC
HCT: 32 — ABNORMAL LOW
Hemoglobin: 11.3 — ABNORMAL LOW
MCHC: 35.3
MCV: 86
Platelets: 210
RBC: 3.72 — ABNORMAL LOW
RDW: 12
WBC: 5.1

## 2011-03-29 LAB — POCT PREGNANCY, URINE
Operator id: 114531
Preg Test, Ur: NEGATIVE

## 2011-04-11 ENCOUNTER — Other Ambulatory Visit: Payer: Self-pay | Admitting: Obstetrics and Gynecology

## 2011-04-11 DIAGNOSIS — N6489 Other specified disorders of breast: Secondary | ICD-10-CM

## 2011-04-26 ENCOUNTER — Ambulatory Visit
Admission: RE | Admit: 2011-04-26 | Discharge: 2011-04-26 | Disposition: A | Discharge status: Home / Self Care | Payer: BC Managed Care – PPO | Source: Ambulatory Visit | Attending: Obstetrics and Gynecology | Admitting: Obstetrics and Gynecology

## 2011-04-26 ENCOUNTER — Encounter (HOSPITAL_COMMUNITY): Payer: BC Managed Care – PPO

## 2011-04-26 ENCOUNTER — Other Ambulatory Visit: Payer: Self-pay | Admitting: Orthopedic Surgery

## 2011-04-26 DIAGNOSIS — N6489 Other specified disorders of breast: Secondary | ICD-10-CM

## 2011-04-26 LAB — COMPREHENSIVE METABOLIC PANEL
ALT: 10 U/L (ref 0–35)
AST: 13 U/L (ref 0–37)
Albumin: 4.4 g/dL (ref 3.5–5.2)
Alkaline Phosphatase: 91 U/L (ref 39–117)
BUN: 11 mg/dL (ref 6–23)
CO2: 31 mEq/L (ref 19–32)
Calcium: 9.9 mg/dL (ref 8.4–10.5)
Chloride: 100 mEq/L (ref 96–112)
Creatinine, Ser: 0.62 mg/dL (ref 0.50–1.10)
GFR calc Af Amer: >90 mL/min (ref >90)
GFR calc non Af Amer: >90 mL/min (ref >90)
Glucose, Bld: 99 mg/dL (ref 70–99)
Potassium: 4 mEq/L (ref 3.5–5.1)
Sodium: 140 mEq/L (ref 135–145)
Total Bilirubin: 0.3 mg/dL (ref 0.3–1.2)
Total Protein: 7.1 g/dL (ref 6.0–8.3)

## 2011-04-26 LAB — APTT: aPTT: 32 seconds (ref 24–37)

## 2011-04-26 LAB — CBC
HCT: 35.7 % — ABNORMAL LOW (ref 36.0–46.0)
Hemoglobin: 11.9 g/dL — ABNORMAL LOW (ref 12.0–15.0)
MCH: 27.9 pg (ref 26.0–34.0)
MCHC: 33.3 g/dL (ref 30.0–36.0)
MCV: 83.8 fL (ref 78.0–100.0)
Platelets: 206 10*3/uL (ref 150–400)
RBC: 4.26 MIL/uL (ref 3.87–5.11)
RDW: 13.3 % (ref 11.5–15.5)
WBC: 4.2 10*3/uL (ref 4.0–10.5)

## 2011-04-26 LAB — URINALYSIS, ROUTINE W REFLEX MICROSCOPIC
Bilirubin Urine: NEGATIVE
Glucose, UA: NEGATIVE mg/dL
Hgb urine dipstick: NEGATIVE
Ketones, ur: NEGATIVE mg/dL
Nitrite: NEGATIVE
Protein, ur: NEGATIVE mg/dL
Specific Gravity, Urine: 1.01 (ref 1.005–1.030)
Urobilinogen, UA: 0.2 mg/dL (ref 0.0–1.0)
pH: 7.5 (ref 5.0–8.0)

## 2011-04-26 LAB — SURGICAL PCR SCREEN
MRSA, PCR: NEGATIVE
Staphylococcus aureus: POSITIVE — AB

## 2011-04-26 LAB — URINE MICROSCOPIC-ADD ON

## 2011-04-26 LAB — PROTIME-INR
INR: 0.95 (ref 0.00–1.49)
Prothrombin Time: 12.9 seconds (ref 11.6–15.2)

## 2011-05-02 DIAGNOSIS — Y831 Surgical operation with implant of artificial internal device as the cause of abnormal reaction of the patient, or of later complication, without mention of misadventure at the time of the procedure: Secondary | ICD-10-CM | POA: Diagnosis present

## 2011-05-02 DIAGNOSIS — D62 Acute posthemorrhagic anemia: Secondary | ICD-10-CM | POA: Diagnosis not present

## 2011-05-02 DIAGNOSIS — Z96659 Presence of unspecified artificial knee joint: Secondary | ICD-10-CM

## 2011-05-02 DIAGNOSIS — Z01812 Encounter for preprocedural laboratory examination: Secondary | ICD-10-CM

## 2011-05-02 DIAGNOSIS — Z981 Arthrodesis status: Secondary | ICD-10-CM

## 2011-05-02 DIAGNOSIS — T84099A Other mechanical complication of unspecified internal joint prosthesis, initial encounter: Principal | ICD-10-CM | POA: Diagnosis present

## 2011-05-02 DIAGNOSIS — IMO0002 Reserved for concepts with insufficient information to code with codable children: Secondary | ICD-10-CM | POA: Diagnosis present

## 2011-05-02 DIAGNOSIS — Z01818 Encounter for other preprocedural examination: Secondary | ICD-10-CM

## 2011-05-02 DIAGNOSIS — M1710 Unilateral primary osteoarthritis, unspecified knee: Secondary | ICD-10-CM | POA: Diagnosis present

## 2011-05-02 DIAGNOSIS — R011 Cardiac murmur, unspecified: Secondary | ICD-10-CM | POA: Diagnosis present

## 2011-05-02 LAB — ABO/RH: ABO/RH(D): A NEG

## 2011-05-03 LAB — BASIC METABOLIC PANEL
BUN: 9 mg/dL (ref 6–23)
CO2: 33 mEq/L — ABNORMAL HIGH (ref 19–32)
Calcium: 8.8 mg/dL (ref 8.4–10.5)
Chloride: 99 mEq/L (ref 96–112)
Creatinine, Ser: 0.5 mg/dL (ref 0.50–1.10)
GFR calc Af Amer: >90 mL/min (ref >90)
GFR calc non Af Amer: >90 mL/min (ref >90)
Glucose, Bld: 130 mg/dL — ABNORMAL HIGH (ref 70–99)
Potassium: 4 mEq/L (ref 3.5–5.1)
Sodium: 137 mEq/L (ref 135–145)

## 2011-05-03 LAB — CBC
HCT: 23.3 % — ABNORMAL LOW (ref 36.0–46.0)
Hemoglobin: 7.6 g/dL — ABNORMAL LOW (ref 12.0–15.0)
MCH: 27.9 pg (ref 26.0–34.0)
MCHC: 32.6 g/dL (ref 30.0–36.0)
MCV: 85.7 fL (ref 78.0–100.0)
Platelets: 194 10*3/uL (ref 150–400)
RBC: 2.72 MIL/uL — ABNORMAL LOW (ref 3.87–5.11)
RDW: 13.7 % (ref 11.5–15.5)
WBC: 7.5 10*3/uL (ref 4.0–10.5)

## 2011-05-03 LAB — PREPARE RBC (CROSSMATCH)

## 2011-05-04 LAB — TYPE AND SCREEN
ABO/RH(D): A NEG
Antibody Screen: NEGATIVE
Unit division: 0
Unit division: 0

## 2011-05-04 LAB — BASIC METABOLIC PANEL
BUN: 10 mg/dL (ref 6–23)
CO2: 32 mEq/L (ref 19–32)
Calcium: 8.4 mg/dL (ref 8.4–10.5)
Chloride: 100 mEq/L (ref 96–112)
Creatinine, Ser: 0.52 mg/dL (ref 0.50–1.10)
GFR calc Af Amer: >90 mL/min (ref >90)
GFR calc non Af Amer: >90 mL/min (ref >90)
Glucose, Bld: 136 mg/dL — ABNORMAL HIGH (ref 70–99)
Potassium: 3.4 mEq/L — ABNORMAL LOW (ref 3.5–5.1)
Sodium: 137 mEq/L (ref 135–145)

## 2011-05-04 LAB — CBC
HCT: 27.3 % — ABNORMAL LOW (ref 36.0–46.0)
Hemoglobin: 9.1 g/dL — ABNORMAL LOW (ref 12.0–15.0)
MCH: 28.1 pg (ref 26.0–34.0)
MCHC: 33.3 g/dL (ref 30.0–36.0)
MCV: 84.3 fL (ref 78.0–100.0)
Platelets: 136 10*3/uL — ABNORMAL LOW (ref 150–400)
RBC: 3.24 MIL/uL — ABNORMAL LOW (ref 3.87–5.11)
RDW: 13.8 % (ref 11.5–15.5)
WBC: 5.5 10*3/uL (ref 4.0–10.5)

## 2011-05-04 NOTE — Op Note (Signed)
NAME:  Nicole Oneill, Nicole Oneill NO.:  [id number]  MEDICAL RECORD NO.:  [id number]  LOCATION:  0006                         FACILITY:  [facility]  PHYSICIAN:  Ollen Gross, M.D.    DATE OF BIRTH:  1960/10/21  DATE OF PROCEDURE:  05/02/2011 DATE OF DISCHARGE:                              OPERATIVE REPORT   PREOPERATIVE DIAGNOSIS:  Failed left knee unicompartmental replacement.  POSTOPERATIVE DIAGNOSIS:  Failed left knee unicompartmental replacement.  PROCEDURE:  Revision of left knee unicompartmental replacement to total knee arthroplasty.  SURGEON:  Ollen Gross, M.D.  ASSISTANT:  Alexzandrew L. Perkins, P.A.C.  ANESTHESIA:  General.  ESTIMATED BLOOD LOSS:  Minimal.  DRAINS:  Hemovac x1.  TOURNIQUET TIME:  46 minutes at 300 mmHg.  COMPLICATIONS:  None.  CONDITION:  Stable to recovery.  BRIEF CLINICAL NOTE:  Nicole Oneill is a 50 year old female with severe left knee pain and dysfunction.  She has had a previous unicompartmental replacement to the left knee.  She has had persistent pain and worsening function.  She has pain at all times.  She is at a stage now where she cannot do things she desires.  She even has pain at rest.  She presents now for revision of the uni to a total knee arthroplasty.  PROCEDURE IN DETAIL:  After successful administration of general anesthetic, a tourniquet was placed high on the left thigh, and left lower extremity was prepped and draped in the usual sterile fashion. Extremity was wrapped in Esmarch, knee flexed, tourniquet inflated to 300 mmHg.  Midline incision was made with a 10 blade through subcutaneous tissue to the level of the extensor mechanism.  Fresh blade was used to make a medial parapatellar arthrotomy.  Soft tissue on the proximal medial tibia subperiosteally elevated to the joint line with a knife and into the semimembranosus bursa with a Cobb elevator.  Soft tissue laterally was elevated with attention being paid to  avoid the patellar tendon on tibial tubercle.  Patella was everted.  She has severe arthritis in the patellofemoral compartment.  She also has degenerative change laterally.  I used a tiny osteotome to disrupt the interface between the femoral component of the unicompartmental replacement and the bone.  The femoral component was easily removed. Cement was then taken out of the femur.  A drill was used to create a starting hole in the distal femur and the canal was thoroughly irrigated.  A 5-degree left valgus alignment guide was placed and the distal femoral cutting block was pinned to remove 10 mm off the distal femoral lateral side.  Resection was made with an oscillating saw.  We got down to healthy bone medially.  The tibia was subluxed forward and the menisci are removed.  The extramedullary tibial alignment guide was then placed referencing proximally to the medial aspect of tibial tubercle and distally along the second metatarsal axis and tibial crest.  The block was pinned to remove 2-mm off the medial side just below the prosthesis.  Resection was made with an oscillating saw.  We then prepared the proximal tibia with the modular drill and keel punch for the size 2.5 MBT revision tray.  I  also reamed distally to 13 mm, placement of 13 mm cemented stem.  The trial size 2.5 MBT revision tray with a 13 x 30 stem extension was placed with excellent fit.  The femoral sizing guide was placed size 3 was most appropriate. Rotations marked at the epicondylar axis.  We also placed laminar spreaders in the flexion space to gain a rectangular flexion space and this rotation corresponded with the epicondylar axis.  The size 3 cutting block was pinned in this rotation and the anterior, posterior, and chamfer cuts were made.  Intercondylar block was placed and that cut was made.  Trial size 3 posterior stabilized femur was placed.  A 12.5 mm posterior stabilized rotating platform insert  trial was placed.  Full extensions achieved with excellent varus-valgus and anterior-posterior balance throughout full range of motion.  The patella was everted and thickness measured to 22 mm.  Freehand resection was taken to 12 mm, 35 template was placed, lug holes were drilled, trial patella was placed and it tracks normally.  Osteophytes removed off the posterior femur with the trial in place.  All trials were removed and the cut bone surfaces were prepared with pulsatile lavage and the size 4 cement restrictor was placed at the appropriate depth in the tibial canal. Cement was mixed and once ready for implantation, the tibial side was first cemented which was a size 2.5 mg MBT revision tray with a 13 x 30 stem extension.  I also cemented the size 3 posterior stabilized femur, and 35 patella in place.  The patella was held with a clamp.  Trial 12.5 mm inserts placed, knee held in full extension, and all extruded cement were removed.  When the cement was fully hardened, then the permanent 12.5 mm posterior stabilized rotating platform insert was placed into the tibial tray.  The wound was copiously irrigated with saline solution and the arthrotomy closed over Hemovac drain with interrupted #1 PDS. Flexion against gravity was 135 degrees.  The patella tracks normally. Tourniquet released to a total time of 46 minutes.  Subcu was closed interrupted 2-0 Vicryl and subcuticular running 4-0 Monocryl.  Incisions was cleaned and dried and Steri-Strips and a bulky sterile dressing were applied.  She was then placed into a knee immobilizer, awakened, and transported to recovery in stable condition.  Please note that a surgical assistant was a medical necessity for this procedure in order to perform it in a safe and expeditious manner. Surgical assistant was necessary for retraction of the ligaments and vital neurovascular structures to prevent injury to them.  Also for assistance in removing  the previous prosthesis and for hold the knee and leg for proper position to allow for anatomic alignment of her components.     Ollen Gross, M.D.     FA/MEDQ  D:  05/02/2011  T:  05/02/2011  Job:  [id number]  Electronically Signed by Ollen Gross M.D. on 05/04/2011 02:55:12 PM

## 2011-05-04 NOTE — H&P (Signed)
NAME:  Nicole Oneill, Nicole Oneill NO.:  [id number]  MEDICAL RECORD NO.:  [id number]  LOCATION:                               FACILITY:  [facility]  PHYSICIAN:  Ollen Gross, M.D.    DATE OF BIRTH:  Jun 26, 1961  DATE OF ADMISSION:  05/02/2011 DATE OF DISCHARGE:                             HISTORY & PHYSICAL   CHIEF COMPLAINT:  Left knee pain.  HISTORY OF PRESENT ILLNESS:  The patient is a 50 year old female who has been seen by Dr. Lequita Halt for ongoing left knee pain.  She had an unicompartmental replacement about 7 years ago and initially did well and then she ended up having osteotomies of both tibias done in [place].  She states the left knee still bothers her at this point.  She fell landing on the right knee about 6 months ago after having had an osteotomy.  The left knee continues to be an issue too with reoccurrence and progressive pain.  She has seen in the office where x-rays show excellent correction of her alignment.  Left side shows the unicompartmental replacement in good position, but she has developed some lateral compartment on right arthritis and also patellofemoral compartmental arthritis appears to be a slight overcorrection of the alignment of the left knee.  Floors of both osteotomies have healed. She has tried an injection, which only provided temporary relief.  It is felt at this point with injections only minimally helping and the arthritis which is gone on to develop in the lateral and patellofemoral compartments.  It is felt she would best be served by undergoing conversion of the uni-knee over to a total knee.  Risks and benefits have been discussed and she elected to proceed with surgery.  ALLERGIES:  No known drug allergies.  CURRENT MEDICATIONS:  Artificial tears, probiotics, multivitamin, calcium plus D, Instaflex, glucosamine  sulfate, gabapentin, methocarbamol, sertraline, Ambien, meloxicam.  PAST MEDICAL HISTORY: 1. Cardiac murmur. 2.  She has also had a past history of urinary retention after major     surgeries. 3. Degenerative disk disease. 4. Postmenopausal.  PAST SURGICAL HISTORY:  Biliary duct surgery, right knee scope in 1998, left knee scope in 2000, knee scope again in 2002, lumbar diskectomy at L3-L5 in 2004, left knee unicompartmental replacement in 2005.  She had an L3 through L5 fusion in 2006 with also cervical C5-C7 fusion.  She also had left tibial osteotomies in 2008 done in [place] and she has had laparoscopies for endometriosis x3.  She has also had lumbar fusion T10-S1 in August 2012.  FAMILY HISTORY:  Father with prostate cancer and kidney disease.  Mother deceased at 62 with a history of stroke.  SOCIAL HISTORY:  Married.  Nonsmoker.  2 drinks of wine per week.  Her mother-in-law will be assisting with care after surgery.  She does have a living will.  REVIEW OF SYSTEMS:  GENERAL:  No fever, chills, or night sweats.  NEURO: No seizure, syncope, or paralysis.  RESPIRATORY:  No shortness of breath, productive cough, or hemoptysis.  CARDIOVASCULAR:  No chest pain, angina, or orthopnea.  GI:  No nausea, vomiting, diarrhea, or constipation.  GU:  No dysuria, hematuria, or discharge. MUSCULOSKELETAL:  Knee pain.  PHYSICAL EXAMINATION:  VITAL SIGNS:  Pulse 92, respirations 14, blood pressure 120/72. GENERAL:  A 50 year old white female, tall, thin frame, well nourished, well developed, in no acute distress.  She is alert, oriented, and cooperative. HEENT:  Normocephalic, atraumatic.  Pupils are round, reactive.  EOMs intact. NECK:  Supple. CHEST:  Clear. HEART:  Regular rate and rhythm without murmur. ABDOMEN:  Soft, flat, nontender.  Bowel sounds present. RECTAL/BREASTS/GENITALIA:  Not done, not pertinent to present illness. EXTREMITIES:  Left knee, range of motion 0-125, moderate crepitus, no instability.  IMPRESSION:  Osteoarthritis, left knee, with a previous left knee unicompartmental  replacement.  PLAN:  The patient admitted to [facility] to undergo conversion of previous left uni-knee over to a left total knee arthroplasty.  Surgery will be performed by Dr. Ollen Gross.     Alexzandrew L. Julien Girt, P.A.C.   ______________________________ Ollen Gross, M.D.    ALP/MEDQ  D:  05/01/2011  T:  05/02/2011  Job:  [id number]  cc:   Theressa Millard, M.D. Fax: [phone]  Electronically Signed by Patrica Duel P.A.C. on 05/03/2011 10:50:52 AM Electronically Signed by Ollen Gross M.D. on 05/04/2011 02:55:09 PM

## 2011-05-05 LAB — CBC
HCT: 26 % — ABNORMAL LOW (ref 36.0–46.0)
Hemoglobin: 8.6 g/dL — ABNORMAL LOW (ref 12.0–15.0)
MCH: 28.3 pg (ref 26.0–34.0)
MCHC: 33.1 g/dL (ref 30.0–36.0)
MCV: 85.5 fL (ref 78.0–100.0)
Platelets: 157 10*3/uL (ref 150–400)
RBC: 3.04 MIL/uL — ABNORMAL LOW (ref 3.87–5.11)
RDW: 13.8 % (ref 11.5–15.5)
WBC: 7.2 10*3/uL (ref 4.0–10.5)

## 2011-05-11 NOTE — Discharge Summary (Signed)
NAME:  Nicole Oneill, Nicole Oneill                  ACCOUNT NO.:  [id number]  MEDICAL RECORD NO.:  [id number]  LOCATION:  1607                         FACILITY:  [facility]  PHYSICIAN:  Ollen Gross, M.D.    DATE OF BIRTH:  1961-04-14  DATE OF ADMISSION:  05/02/2011 DATE OF DISCHARGE:  05/06/2011                              DISCHARGE SUMMARY   ADMITTING DIAGNOSES: 1. Left knee pain, post unicompartmental replacement. 2. Cardiac murmur. 3. Past history of urinary retention after major surgery. 4. Degenerative disk disease. 5. Postmenopausal.  DISCHARGE DIAGNOSES: 1. Failed left unicompartmental replacement, status post revision,     left unicompartmental knee over conversion over to a left total     knee. 2. Postoperative acute blood loss anemia. 3. Transfusion without sequelae. 4. Cardiac murmur. 5. Past history of urinary retention after major surgery. 6. Degenerative disk disease. 7. Postmenopausal.  PROCEDURE:  May 02, 2011, conversion of a left unicompartmental replacement over to a left total knee replacement.  SURGEON:  Ollen Gross, M.D.  ASSISTANT:  Alexzandrew L. Perkins, P.A.C.  ANESTHESIA:  General.  TOURNIQUET TIME:  46 minutes.  CONSULTS:  None.  BRIEF HISTORY:  The patient is a 50 year old female with severe left knee pain and dysfunction.  She had a previous unicompartmental replacement on the left knee.  She has persistent pain and worsening. She has pain at all times, now she is at a state where now she would like to have something done about it.  She denies pain at rest although she would benefit undergoing conversion of the Uni over to a total knee.  LABORATORY DATA:  Preop CBC, not scanned in the chart, but the postop hemoglobin was down to 7.6 at which point she was given blood __________ hemoglobin back up to 9.1; last H and H 8.6 and 26.0.  Chem panel on admission not scanned in the chart.  BMET on day 1 and day 2, she had a slight drop in potassium from  4-3.4.  Glucose was 130 on postop day 1 and 136 on postop day 2.  Remaining electrolytes remained within normal limits.  Blood group type A negative.  HOSPITAL COURSE:  The patient was admitted to [facility], taken to the OR, underwent above-stated procedure without complication. The patient tolerated the procedure well, later transferred from the recovery room to orthopedic floor; doing pretty well on the morning of day 1, her hemoglobin was noted to be 7.6.  It was noted that she had had a previous back surgery just 2 months before and hemoglobin was not at its probable highest level and it did dropped down, she was down to 7.6 and felt that she would require blood, given 2 units and actually did fairly well with the transfusion and felt much better.  The plan was to go home, so we got discharge planning involved.  By day 2, she had had some difficulty getting up and had a fair amount of pain when she was off her IV meds during the transfusion.  The patient was doing little bit better once they got the pain under control and was given her IV meds after the blood.  She was  also back on to her oral meds. Potassium down a little bit to 3.4 and hemoglobin was 9.1, which improved with the blood and __________ day with therapy; so by day 3, she was progressing with therapy, but not quite had met all of her goals.  We left her Foley and at that point, she had had major urinary retention following all of her previous surgeries and requiring leg bag. She was not ready on day 3 and by day 4 though, she was doing much better,  pain was under better control.  She was progressing with physical therapy and she was discharged home at that time.  DISCHARGE PLAN: 1. The patient discharged home on May 06, 2011. 2. Discharge diagnoses, please see above. 3. Discharge medications:  Flexeril, Dilaudid, and Xarelto.  Continue     Ambien, Artificial Tears, gabapentin, and Sertraline.  DIET:   As tolerated.  ACTIVITY:  Weightbearing as tolerated.  Total hip protocol.  FOLLOWUP:  2 weeks.  DISPOSITION:  Home.  CONDITION ON DISCHARGE:  Improved.     Alexzandrew L. Julien Girt, P.A.C.   ______________________________ Ollen Gross, M.D.    ALP/MEDQ  D:  05/10/2011  T:  05/10/2011  Job:  [id number]  cc:   Theressa Millard, M.D. Fax: [phone]  Electronically Signed by Ollen Gross M.D. on 05/11/2011 09:46:45 AM

## 2011-08-21 ENCOUNTER — Other Ambulatory Visit: Payer: Self-pay | Admitting: Obstetrics and Gynecology

## 2011-08-21 DIAGNOSIS — Z1231 Encounter for screening mammogram for malignant neoplasm of breast: Secondary | ICD-10-CM

## 2011-10-19 ENCOUNTER — Ambulatory Visit
Admission: RE | Admit: 2011-10-19 | Discharge: 2011-10-19 | Disposition: A | Discharge status: Home / Self Care | Payer: BC Managed Care – PPO | Source: Ambulatory Visit | Attending: Obstetrics and Gynecology | Admitting: Obstetrics and Gynecology

## 2011-10-19 DIAGNOSIS — Z1231 Encounter for screening mammogram for malignant neoplasm of breast: Secondary | ICD-10-CM

## 2011-10-31 ENCOUNTER — Other Ambulatory Visit: Payer: Self-pay | Admitting: Obstetrics and Gynecology

## 2012-04-29 ENCOUNTER — Other Ambulatory Visit: Payer: Self-pay | Admitting: Orthopedic Surgery

## 2012-04-29 DIAGNOSIS — M546 Pain in thoracic spine: Secondary | ICD-10-CM

## 2012-05-02 ENCOUNTER — Ambulatory Visit
Admission: RE | Admit: 2012-05-02 | Discharge: 2012-05-02 | Disposition: A | Discharge status: Home / Self Care | Payer: BC Managed Care – PPO | Source: Ambulatory Visit | Attending: Orthopedic Surgery | Admitting: Orthopedic Surgery

## 2012-05-02 ENCOUNTER — Other Ambulatory Visit: Payer: BC Managed Care – PPO

## 2012-05-02 DIAGNOSIS — M546 Pain in thoracic spine: Secondary | ICD-10-CM

## 2012-06-30 ENCOUNTER — Other Ambulatory Visit: Payer: Self-pay | Admitting: Orthopedic Surgery

## 2012-06-30 MED ORDER — DEXAMETHASONE SODIUM PHOSPHATE 10 MG/ML IJ SOLN: 10.0000 mg | Freq: Once | INTRAMUSCULAR | Status: DC

## 2012-06-30 NOTE — Progress Notes (Signed)
Preoperative surgical orders have been place into the Epic hospital system for [name] on 06/30/2012, 5:14 PM  by Patrica Duel for surgery on 08/06/2012.  Preop Knee Scope orders including IV Tylenol and IV Decadron as long as there are no contraindications to the above medications. Avel Peace, PA-C

## 2012-07-25 ENCOUNTER — Encounter (HOSPITAL_COMMUNITY): Payer: Self-pay | Admitting: Pharmacy Technician

## 2012-07-28 ENCOUNTER — Other Ambulatory Visit (HOSPITAL_COMMUNITY): Payer: Self-pay | Admitting: *Deleted

## 2012-07-29 ENCOUNTER — Encounter (HOSPITAL_COMMUNITY)
Admission: RE | Admit: 2012-07-29 | Discharge: 2012-07-29 | Disposition: A | Discharge status: Home / Self Care | Payer: BC Managed Care – PPO | Source: Ambulatory Visit | Attending: Orthopedic Surgery | Admitting: Orthopedic Surgery

## 2012-07-29 ENCOUNTER — Encounter (HOSPITAL_COMMUNITY): Payer: Self-pay

## 2012-07-29 HISTORY — DX: Adverse effect of unspecified anesthetic, initial encounter: T41.45XA

## 2012-07-29 HISTORY — DX: Personal history of other medical treatment: Z92.89

## 2012-07-29 HISTORY — DX: Unspecified osteoarthritis, unspecified site: M19.90

## 2012-07-29 HISTORY — DX: Fibromyalgia: M79.7

## 2012-07-29 HISTORY — DX: Other complications of anesthesia, initial encounter: T88.59XA

## 2012-07-29 HISTORY — DX: Anemia, unspecified: D64.9

## 2012-07-29 LAB — SURGICAL PCR SCREEN
MRSA, PCR: NEGATIVE
Staphylococcus aureus: NEGATIVE

## 2012-07-29 LAB — CBC
HCT: 36.8 % (ref 36.0–46.0)
Hemoglobin: 12.3 g/dL (ref 12.0–15.0)
MCH: 29.3 pg (ref 26.0–34.0)
MCHC: 33.4 g/dL (ref 30.0–36.0)
MCV: 87.6 fL (ref 78.0–100.0)
Platelets: 189 10*3/uL (ref 150–400)
RBC: 4.2 MIL/uL (ref 3.87–5.11)
RDW: 12.8 % (ref 11.5–15.5)
WBC: 4.1 10*3/uL (ref 4.0–10.5)

## 2012-07-29 NOTE — Patient Instructions (Addendum)
20 Nicole Oneill  07/29/2012   Your procedure is scheduled on:  08/06/12  [facility]  Report to [facility] at   0645    AM.  Call this number if you have problems the morning of surgery: [phone]       Remember:   Do not eat food  Or drink :After Midnight. Tuesday NIGHT   Take these medicines the morning of surgery with A SIP OF WATER:  NONE   .  Contacts, dentures or partial plates can not be worn to surgery  Leave suitcase in the car. After surgery it may be brought to your room.  For patients admitted to the hospital, checkout time is 11:00 AM day of  discharge.             SPECIAL INSTRUCTIONS- SEE [place] PREPARING FOR SURGERY INSTRUCTION SHEET-     DO NOT WEAR JEWELRY, LOTIONS, POWDERS, OR PERFUMES.  WOMEN-- DO NOT SHAVE LEGS OR UNDERARMS FOR 12 HOURS BEFORE SHOWERS. MEN MAY SHAVE FACE.  Patients discharged the day of surgery will not be allowed to drive home. IF going home the day of surgery, you must have a driver and someone to stay with you for the first 24 hours  Name and phone number of your driver:  Husband or mother in law                                                                      Please read over the following fact sheets that you were given: MRSA Information, Incentive Spirometry Sheet,                                                                                    [name]  PST 336  [id number]                 FAILURE TO FOLLOW THESE INSTRUCTIONS MAY RESULT IN  CANCELLATION   OF YOUR SURGERY                                                  Patient Signature _____________________________

## 2012-08-05 DIAGNOSIS — M25462 Effusion, left knee: Secondary | ICD-10-CM | POA: Diagnosis present

## 2012-08-05 MED ORDER — CEFAZOLIN SODIUM-DEXTROSE 2-3 GM-% IV SOLR
2.0000 g | INTRAVENOUS | Status: AC
  Administered 2012-08-06: 2 g via INTRAVENOUS

## 2012-08-05 NOTE — H&P (Signed)
  CC- Nicole Oneill is a 52 y.o. female who presents with left knee pain.  HPI- . Knee Pain: Patient presents with knee pain involving the  left knee. Onset of the symptoms was several months ago. Inciting event: none known. Current symptoms include crepitus sensation and pain located anteriorly. Pain is aggravated by going up and down stairs and rising after sitting.  Patient has had prior knee problems and several surgeries, most recently conversion of a unicompartmental replacement to a left TKA. Evaluation to date: plain films: normal. Treatment to date: PT which was not very effective.  Past Medical History  Diagnosis Date  . Fibromyalgia   . Arthritis     osteo  . Anemia   . History of blood transfusion   . Complication of anesthesia     urinary retention requiring indwelling  foley catheterization    Past Surgical History  Procedure Date  . Back surgery     Cervical fusion C5-7, fusion  T10-S1  . Joint replacement     partial left knee 2005, total left knee 2012  . Eye surgery     bilateral tear duct fusion  . Osteotomy     bilateral tibial  . Diagnostic laparoscopy     x 3    Prior to Admission medications   Medication Sig Start Date End Date Taking? Authorizing Provider  calcium citrate-vitamin D (CITRACAL+D) 315-200 MG-UNIT per tablet Take 1 tablet by mouth 2 (two) times daily.    Historical Provider, MD  cyclobenzaprine (FLEXERIL) 10 MG tablet Take 10 mg by mouth at bedtime. Muscle spasms    Historical Provider, MD  ferrous fumarate (HEMOCYTE - 106 MG FE) 325 (106 FE) MG TABS Take 1 tablet by mouth daily.    Historical Provider, MD  meloxicam (MOBIC) 15 MG tablet Take 15 mg by mouth daily.    Historical Provider, MD  NON FORMULARY Apply 0.5 mLs topically every evening. biest topical hormone cream    Historical Provider, MD  OVER THE COUNTER MEDICATION Take 3 tablets by mouth every evening. instaflex-joint support    Historical Provider, MD  progesterone (PROMETRIUM) 100  MG capsule Take 100 mg by mouth at bedtime.     Historical Provider, MD  sertraline (ZOLOFT) 25 MG tablet Take 25 mg by mouth at bedtime.     Historical Provider, MD  zolpidem (AMBIEN CR) 12.5 MG CR tablet Take 12.5 mg by mouth at bedtime as needed. Sleep    Historical Provider, MD   KNEE EXAM No effusion, collateral ligaments intact, patellar tenderness, crepitus on ROM, range 0-125  Physical Examination: General appearance - alert, well appearing, and in no distress Mental status - alert, oriented to person, place, and time Chest - clear to auscultation, no wheezes, rales or rhonchi, symmetric air entry Heart - normal rate, regular rhythm, normal S1, S2, no murmurs, rubs, clicks or gallops Abdomen - soft, nontender, nondistended, no masses or organomegaly Neurological - alert, oriented, normal speech, no focal findings or movement disorder noted   Asessment/Plan--- Left knee hypertrophic synovitis- - Plan left knee arthroscopy with synovectomy. Procedure risks and potential comps discussed with patient who elects to proceed. Goals are decreased pain and increased function with a high likelihood of achieving both

## 2012-08-06 ENCOUNTER — Encounter (HOSPITAL_COMMUNITY)
Admission: RE | Disposition: A | Discharge status: Home / Self Care | Payer: Self-pay | Source: Ambulatory Visit | Attending: Orthopedic Surgery

## 2012-08-06 ENCOUNTER — Encounter (HOSPITAL_COMMUNITY): Payer: Self-pay | Admitting: Anesthesiology

## 2012-08-06 ENCOUNTER — Ambulatory Visit (HOSPITAL_COMMUNITY): Payer: BC Managed Care – PPO | Admitting: Anesthesiology

## 2012-08-06 ENCOUNTER — Ambulatory Visit (HOSPITAL_COMMUNITY)
Admission: RE | Admit: 2012-08-06 | Discharge: 2012-08-06 | Disposition: A | Discharge status: Home / Self Care | Payer: BC Managed Care – PPO | Source: Ambulatory Visit | Attending: Orthopedic Surgery | Admitting: Orthopedic Surgery

## 2012-08-06 ENCOUNTER — Encounter (HOSPITAL_COMMUNITY): Payer: Self-pay

## 2012-08-06 DIAGNOSIS — M25462 Effusion, left knee: Secondary | ICD-10-CM | POA: Diagnosis present

## 2012-08-06 DIAGNOSIS — M659 Synovitis and tenosynovitis, unspecified: Secondary | ICD-10-CM

## 2012-08-06 DIAGNOSIS — Z79899 Other long term (current) drug therapy: Secondary | ICD-10-CM | POA: Insufficient documentation

## 2012-08-06 DIAGNOSIS — IMO0001 Reserved for inherently not codable concepts without codable children: Secondary | ICD-10-CM | POA: Insufficient documentation

## 2012-08-06 DIAGNOSIS — D649 Anemia, unspecified: Secondary | ICD-10-CM | POA: Insufficient documentation

## 2012-08-06 DIAGNOSIS — M6580 Other synovitis and tenosynovitis, unspecified site: Secondary | ICD-10-CM | POA: Insufficient documentation

## 2012-08-06 DIAGNOSIS — R29898 Other symptoms and signs involving the musculoskeletal system: Secondary | ICD-10-CM | POA: Insufficient documentation

## 2012-08-06 DIAGNOSIS — Z01812 Encounter for preprocedural laboratory examination: Secondary | ICD-10-CM | POA: Insufficient documentation

## 2012-08-06 HISTORY — PX: KNEE ARTHROSCOPY: SHX127

## 2012-08-06 SURGERY — ARTHROSCOPY, KNEE
Anesthesia: General | Site: Knee | Laterality: Left | Wound class: Clean

## 2012-08-06 MED ORDER — ONDANSETRON HCL 4 MG/2ML IJ SOLN
INTRAMUSCULAR | Status: DC | PRN
  Administered 2012-08-06: 4 mg via INTRAVENOUS

## 2012-08-06 MED ORDER — OXYCODONE HCL 5 MG PO TABS
5.0000 mg | ORAL_TABLET | ORAL | Status: DC | PRN
  Administered 2012-08-06: 5 mg via ORAL
  Filled 2012-08-06 (×2): qty 1

## 2012-08-06 MED ORDER — BUPIVACAINE-EPINEPHRINE PF 0.25-1:200000 % IJ SOLN
INTRAMUSCULAR | Status: AC
  Filled 2012-08-06: qty 30

## 2012-08-06 MED ORDER — ACETAMINOPHEN 10 MG/ML IV SOLN
INTRAVENOUS | Status: DC | PRN
  Administered 2012-08-06: 1000 mg via INTRAVENOUS

## 2012-08-06 MED ORDER — KETOROLAC TROMETHAMINE 30 MG/ML IJ SOLN
15.0000 mg | Freq: Once | INTRAMUSCULAR | Status: AC | PRN
  Administered 2012-08-06: 30 mg via INTRAVENOUS

## 2012-08-06 MED ORDER — MIDAZOLAM HCL 5 MG/5ML IJ SOLN
INTRAMUSCULAR | Status: DC | PRN
  Administered 2012-08-06: 2 mg via INTRAVENOUS

## 2012-08-06 MED ORDER — ACETAMINOPHEN 10 MG/ML IV SOLN
INTRAVENOUS | Status: AC
  Filled 2012-08-06: qty 100

## 2012-08-06 MED ORDER — PROMETHAZINE HCL 25 MG/ML IJ SOLN: 6.2500 mg | INTRAMUSCULAR | Status: DC | PRN

## 2012-08-06 MED ORDER — LACTATED RINGERS IR SOLN
Status: DC | PRN
  Administered 2012-08-06: 6000 mL

## 2012-08-06 MED ORDER — OXYCODONE HCL 5 MG PO TABS: 5.0000 mg | ORAL_TABLET | ORAL | Status: DC | PRN

## 2012-08-06 MED ORDER — FENTANYL CITRATE 0.05 MG/ML IJ SOLN
25.0000 ug | INTRAMUSCULAR | Status: DC | PRN
  Administered 2012-08-06 (×2): 50 ug via INTRAVENOUS

## 2012-08-06 MED ORDER — LACTATED RINGERS IV SOLN
INTRAVENOUS | Status: DC | PRN
  Administered 2012-08-06 (×2): via INTRAVENOUS

## 2012-08-06 MED ORDER — KETOROLAC TROMETHAMINE 30 MG/ML IJ SOLN
INTRAMUSCULAR | Status: AC
  Filled 2012-08-06: qty 1

## 2012-08-06 MED ORDER — CEFAZOLIN SODIUM-DEXTROSE 2-3 GM-% IV SOLR
INTRAVENOUS | Status: AC
  Filled 2012-08-06: qty 50

## 2012-08-06 MED ORDER — SODIUM CHLORIDE 0.9 % IV SOLN: INTRAVENOUS | Status: DC

## 2012-08-06 MED ORDER — BUPIVACAINE-EPINEPHRINE 0.25% -1:200000 IJ SOLN
INTRAMUSCULAR | Status: DC | PRN
  Administered 2012-08-06: 20 mL

## 2012-08-06 MED ORDER — FENTANYL CITRATE 0.05 MG/ML IJ SOLN
INTRAMUSCULAR | Status: AC
  Filled 2012-08-06: qty 2

## 2012-08-06 MED ORDER — PROPOFOL 10 MG/ML IV BOLUS
INTRAVENOUS | Status: DC | PRN
  Administered 2012-08-06: 200 mg via INTRAVENOUS

## 2012-08-06 MED ORDER — METOCLOPRAMIDE HCL 5 MG/ML IJ SOLN
INTRAMUSCULAR | Status: DC | PRN
  Administered 2012-08-06: 10 mg via INTRAVENOUS

## 2012-08-06 MED ORDER — ACETAMINOPHEN 10 MG/ML IV SOLN: 1000.0000 mg | Freq: Once | INTRAVENOUS | Status: DC

## 2012-08-06 MED ORDER — CYCLOBENZAPRINE HCL 10 MG PO TABS: 10.0000 mg | ORAL_TABLET | Freq: Three times a day (TID) | ORAL | Status: DC | PRN

## 2012-08-06 MED ORDER — DEXAMETHASONE SODIUM PHOSPHATE 4 MG/ML IJ SOLN
INTRAMUSCULAR | Status: DC | PRN
  Administered 2012-08-06: 10 mg via INTRAVENOUS

## 2012-08-06 MED ORDER — HYDROMORPHONE HCL PF 1 MG/ML IJ SOLN
INTRAMUSCULAR | Status: DC | PRN
  Administered 2012-08-06: 1 mg via INTRAVENOUS

## 2012-08-06 SURGICAL SUPPLY — 22 items
BLADE 4.2CUDA (BLADE) ×2 IMPLANT
CLOTH BEACON ORANGE TIMEOUT ST (SAFETY) ×2 IMPLANT
CUFF TOURN SGL QUICK 34 (TOURNIQUET CUFF) ×1
CUFF TRNQT CYL 34X4X40X1 (TOURNIQUET CUFF) ×1 IMPLANT
DRAPE U-SHAPE 47X51 STRL (DRAPES) ×2 IMPLANT
DRSG EMULSION OIL 3X3 NADH (GAUZE/BANDAGES/DRESSINGS) ×2 IMPLANT
DURAPREP 26ML APPLICATOR (WOUND CARE) ×2 IMPLANT
GLOVE BIO SURGEON STRL SZ8 (GLOVE) ×2 IMPLANT
GLOVE BIOGEL PI IND STRL 8 (GLOVE) ×3 IMPLANT
GLOVE BIOGEL PI INDICATOR 8 (GLOVE) ×3
GOWN STRL NON-REIN LRG LVL3 (GOWN DISPOSABLE) ×2 IMPLANT
MANIFOLD NEPTUNE II (INSTRUMENTS) ×2 IMPLANT
PACK ARTHROSCOPY WL (CUSTOM PROCEDURE TRAY) ×2 IMPLANT
PACK ICE MAXI GEL EZY WRAP (MISCELLANEOUS) ×2 IMPLANT
PADDING CAST COTTON 6X4 STRL (CAST SUPPLIES) ×4 IMPLANT
POSITIONER SURGICAL ARM (MISCELLANEOUS) IMPLANT
SET ARTHROSCOPY TUBING (MISCELLANEOUS) ×1
SET ARTHROSCOPY TUBING LN (MISCELLANEOUS) ×1 IMPLANT
SUT ETHILON 4 0 PS 2 18 (SUTURE) ×2 IMPLANT
TOWEL OR 17X26 10 PK STRL BLUE (TOWEL DISPOSABLE) ×2 IMPLANT
WAND 90 DEG TURBOVAC W/CORD (SURGICAL WAND) ×2 IMPLANT
WRAP KNEE MAXI GEL POST OP (GAUZE/BANDAGES/DRESSINGS) IMPLANT

## 2012-08-06 NOTE — Op Note (Signed)
NAME:  Nicole Oneill, Nicole Oneill NO.:  [id number]  MEDICAL RECORD NO.:  [id number]  LOCATION:  [facility]                         FACILITY:  [facility]  PHYSICIAN:  Ollen Gross, M.D.    DATE OF BIRTH:  06/18/1961  DATE OF PROCEDURE:  08/06/2012 DATE OF DISCHARGE:                              OPERATIVE REPORT   PREOPERATIVE DIAGNOSIS:  Left knee hypertrophic synovitis.  POSTOPERATIVE DIAGNOSIS:  Left knee hypertrophic synovitis.  PROCEDURE:  Left knee arthroscopy with synovectomy.  SURGEON:  Ollen Gross, MD  ASSISTANT:  No assistant.  ANESTHESIA:  General.  ESTIMATED BLOOD LOSS:  Minimal.  DRAINS:  None.  COMPLICATIONS:  None.  CONDITION:  Stable to recovery.  BRIEF CLINICAL NOTE:  [name] is a 52 year old female, had a conversion of unicompartmental total knee arthroplasty about a year and half ago.  Had done well within the past few months, has developed painful popping and swelling.  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  She presents now for arthroscopy and synovectomy.  PROCEDURE IN DETAIL:  After successful administration of general anesthetic, tourniquet was placed high on the left thigh.  The left lower extremity was prepped and draped in usual sterile fashion. Standard superomedial and  inferolateral incisions were made, inflow cannula passed, superomedial cannula passed inferolateral.  Arthroscopic visualization proceeds.  There is a large amount of tissue at the junction of the quadriceps tendon and patellar component, which was obliterating the suprapatellar space.  A third superolateral portal was started with an 11 blade and then using combination of the shaver and the ArthroCare, this tissue was debrided back to normal-appearing tissue to clean out the suprapatellar space.  There was also hypertrophic tissue in the lateral gutter which is cleared out with the shaver and the ArthroCare.   Infrapatellar region looked normal as did the medial gutter.  The joints again inspected.  There was no other abnormal tissue noted.  Components all appeared to be well fixed.  The joints again inspected and no other areas need cauterization or debridement.  The arthroscopic equipment was then removed from the lateral portals, which were closed with interrupted 4-0 nylon.  20 mL of 0.25% Marcaine with epinephrine was injected through the inflow cannula, and that was removed, that portal closed with nylon.  Incisions were cleaned and dried and a bulky sterile dressing applied.  She was awakened and transported to recovery in stable condition.     Ollen Gross, M.D.     FA/MEDQ  D:  08/06/2012  T:  08/06/2012  Job:  [id number]

## 2012-08-06 NOTE — Progress Notes (Signed)
Patient  Concerned about positioning during surgery because of Rods in back

## 2012-08-06 NOTE — Interval H&P Note (Signed)
History and Physical Interval Note:  08/06/2012 8:29 AM  Nicole Oneill  has presented today for surgery, with the diagnosis of LEFT KNEE ARTHROFIBROSIS  The various methods of treatment have been discussed with the patient and family. After consideration of risks, benefits and other options for treatment, the patient has consented to  Procedure(s) (LRB) with comments: ARTHROSCOPY KNEE (Left) - WITH SYNOVECTOMY as a surgical intervention .  The patient's history has been reviewed, patient examined, no change in status, stable for surgery.  I have reviewed the patient's chart and labs.  Questions were answered to the patient's satisfaction.     Loanne Drilling

## 2012-08-06 NOTE — Transfer of Care (Signed)
Immediate Anesthesia Transfer of Care Note  Patient: Nicole Oneill  Procedure(s) Performed: Procedure(s) (LRB) with comments: ARTHROSCOPY KNEE (Left) - WITH SYNOVECTOMY and debridement  Patient Location: PACU  Anesthesia Type:General  Level of Consciousness: awake, oriented, patient cooperative and responds to stimulation  Airway & Oxygen Therapy: Patient Spontanous Breathing and Patient connected to face mask oxygen  Post-op Assessment: Report given to PACU RN, Post -op Vital signs reviewed and stable and Patient moving all extremities X 4  Post vital signs: Reviewed and stable  Complications: No apparent anesthesia complications

## 2012-08-06 NOTE — Anesthesia Postprocedure Evaluation (Signed)
  Anesthesia Post-op Note  Patient: Nicole Oneill  Procedure(s) Performed: Procedure(s) (LRB): ARTHROSCOPY KNEE (Left)  Patient Location: PACU  Anesthesia Type: General  Level of Consciousness: awake and alert   Airway and Oxygen Therapy: Patient Spontanous Breathing  Post-op Pain: mild  Post-op Assessment: Post-op Vital signs reviewed, Patient's Cardiovascular Status Stable, Respiratory Function Stable, Patent Airway and No signs of Nausea or vomiting  Last Vitals:  Filed Vitals:   08/06/12 1000  BP: 127/75  Pulse: 106  Temp:   Resp: 18    Post-op Vital Signs: stable   Complications: No apparent anesthesia complications

## 2012-08-06 NOTE — Brief Op Note (Signed)
08/06/2012  9:32 AM  PATIENT:  Nicole Oneill  52 y.o. female  PRE-OPERATIVE DIAGNOSIS:  LEFT KNEE hypertrophic synovitis  POST-OPERATIVE DIAGNOSIS:  left knee hypertrophic synovitis  PROCEDURE:  Procedure(s) (LRB) with comments: ARTHROSCOPY KNEE (Left) - WITH SYNOVECTOMY and debridement  SURGEON:  Surgeon(s) and Role:    * Loanne Drilling, MD - Primary  PHYSICIAN ASSISTANT:   ASSISTANTS: None   ANESTHESIA:   general  EBL:  Total I/O In: 1000 [I.V.:1000] Out: 700 [Urine:700]  LOCAL MEDICATIONS USED:  MARCAINE     DICTATION: .Other Dictation: Dictation Number [id number]  PLAN OF CARE: Discharge to home after PACU  PATIENT DISPOSITION:  PACU - hemodynamically stable.

## 2012-08-06 NOTE — Preoperative (Signed)
Beta Blockers   Reason not to administer Beta Blockers:Not Applicable, not on home BB 

## 2012-08-06 NOTE — Anesthesia Preprocedure Evaluation (Signed)
Anesthesia Evaluation  Patient identified by MRN, date of birth, ID band Patient awake    Reviewed: Allergy & Precautions, H&P , NPO status , Patient's Chart, lab work & pertinent test results  Airway Mallampati: II TM Distance: >3 FB Neck ROM: Full    Dental No notable dental hx.    Pulmonary neg pulmonary ROS,  breath sounds clear to auscultation  Pulmonary exam normal       Cardiovascular negative cardio ROS  Rhythm:Regular Rate:Normal     Neuro/Psych negative neurological ROS  negative psych ROS   GI/Hepatic negative GI ROS, Neg liver ROS,   Endo/Other  negative endocrine ROS  Renal/GU negative Renal ROS  negative genitourinary   Musculoskeletal negative musculoskeletal ROS (+)   Abdominal   Peds negative pediatric ROS (+)  Hematology negative hematology ROS (+)   Anesthesia Other Findings   Reproductive/Obstetrics negative OB ROS                           Anesthesia Physical Anesthesia Plan  ASA: II  Anesthesia Plan: General   Post-op Pain Management:    Induction: Intravenous  Airway Management Planned: LMA  Additional Equipment:   Intra-op Plan:   Post-operative Plan:   Informed Consent: I have reviewed the patients History and Physical, chart, labs and discussed the procedure including the risks, benefits and alternatives for the proposed anesthesia with the patient or authorized representative who has indicated his/her understanding and acceptance.   Dental advisory given  Plan Discussed with: CRNA and Surgeon  Anesthesia Plan Comments:         Anesthesia Quick Evaluation

## 2012-08-07 ENCOUNTER — Encounter (HOSPITAL_COMMUNITY): Payer: Self-pay | Admitting: Orthopedic Surgery

## 2012-09-11 ENCOUNTER — Other Ambulatory Visit: Payer: Self-pay | Admitting: Obstetrics and Gynecology

## 2012-09-11 ENCOUNTER — Other Ambulatory Visit: Payer: Self-pay

## 2012-09-11 DIAGNOSIS — Z1231 Encounter for screening mammogram for malignant neoplasm of breast: Secondary | ICD-10-CM

## 2012-10-28 ENCOUNTER — Ambulatory Visit
Admission: RE | Admit: 2012-10-28 | Discharge: 2012-10-28 | Disposition: A | Discharge status: Home / Self Care | Payer: BC Managed Care – PPO | Source: Ambulatory Visit

## 2012-10-28 DIAGNOSIS — Z1231 Encounter for screening mammogram for malignant neoplasm of breast: Secondary | ICD-10-CM

## 2012-11-07 ENCOUNTER — Other Ambulatory Visit: Payer: Self-pay | Admitting: Obstetrics and Gynecology

## 2012-11-11 ENCOUNTER — Other Ambulatory Visit: Payer: Self-pay | Admitting: Obstetrics and Gynecology

## 2012-11-11 DIAGNOSIS — R922 Inconclusive mammogram: Secondary | ICD-10-CM

## 2013-04-02 ENCOUNTER — Other Ambulatory Visit: Payer: BC Managed Care – PPO

## 2013-05-26 ENCOUNTER — Ambulatory Visit (INDEPENDENT_AMBULATORY_CARE_PROVIDER_SITE_OTHER): Payer: BC Managed Care – PPO | Admitting: Sports Medicine

## 2013-05-26 ENCOUNTER — Encounter: Payer: Self-pay | Admitting: Sports Medicine

## 2013-05-26 VITALS — BP 125/82 | Ht 65.0 in | Wt 130.0 lb

## 2013-05-26 DIAGNOSIS — M310 Hypersensitivity angiitis: Secondary | ICD-10-CM

## 2013-05-26 DIAGNOSIS — M19041 Primary osteoarthritis, right hand: Secondary | ICD-10-CM | POA: Insufficient documentation

## 2013-05-26 DIAGNOSIS — M159 Polyosteoarthritis, unspecified: Secondary | ICD-10-CM

## 2013-05-26 DIAGNOSIS — M19049 Primary osteoarthritis, unspecified hand: Secondary | ICD-10-CM

## 2013-05-26 DIAGNOSIS — M25569 Pain in unspecified knee: Secondary | ICD-10-CM

## 2013-05-26 DIAGNOSIS — S43499A Other sprain of unspecified shoulder joint, initial encounter: Secondary | ICD-10-CM

## 2013-05-26 DIAGNOSIS — S43431A Superior glenoid labrum lesion of right shoulder, initial encounter: Secondary | ICD-10-CM

## 2013-05-26 DIAGNOSIS — S43439A Superior glenoid labrum lesion of unspecified shoulder, initial encounter: Secondary | ICD-10-CM | POA: Insufficient documentation

## 2013-05-26 DIAGNOSIS — M359 Systemic involvement of connective tissue, unspecified: Secondary | ICD-10-CM

## 2013-05-26 DIAGNOSIS — M25562 Pain in left knee: Secondary | ICD-10-CM

## 2013-05-26 NOTE — Assessment & Plan Note (Signed)
I think this may have occurred 2/2 repetitive subluxations from hypermobility

## 2013-05-26 NOTE — Assessment & Plan Note (Signed)
Patient with labral tear on clinical exam. Discussed further work-up of potential collagen vascular disease. Discussed potential use of nitro patches for this issue once she has had her back surgery. Will discuss further options for treatment following recovery from back surgery. Advised to avoid motion of reaching behind her back.

## 2013-05-26 NOTE — Progress Notes (Signed)
  Subjective:    Patient ID: Nicole Oneill, female    DOB: [birth year], 52 y.o.   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   Patient has had progressive degenerative joint changes in her bilateral 1 CMC joints. At this time she is not mobile at the right first [facility] joint and has pain in this area. She also notes 2 months of right posterior shoulder pain. She notes this occurred following sleeping on her side because her back hurts. She notes pain is worse with abduction and external rotation. Described as a burning pain. She notes popping in this shoulder. Notes some weakness in her right upper extremity. Has noted intermittent tingling in her bilateral hands. She has taken mobic for this with some benefit.  Note patient has had generalized DJD since age 81. Had her first knee surgery at age 51, left partial knee replacement and then total knee revision. History of tibial osteotomies in 2008. She has had fusion of T10-S1 at [facility]. She has had fusion of C5-C7 by Dr Danielle Dess. She notes that her surgeon at [facility] feels this is related to a genetic cartilage issue. Additionally patient notes history of being hyperflexible early in life and bruising easily. Also notes that her mother has had the same types of issues she has had.    Review of Systems see HPI     Objective:   Physical Exam Well nourished, well developed  Right shoulder: no apparent swelling, shoulder is hyper mobile with AP subluxation, she is able to fully abduct, adduct, internally rotate and externally rotate this joint, mild tenderness to palpation over the posterior aspect of the humeral head, hawkins with mild pain on external rotation, note scapular dysfunction during range of motion, positive obriens test, rotator cuff strength 5/5, 5/5 grip strength, 4/4 biceps, triceps, and deltoid strength  Right hand: 1st CMC joint with large osteophyte noted, very limited ROM at this  joint, see subluxation of the 1st MCP joint, mild tenderness to palpation of this joint, 5th MCP extends to 110 degrees  Midline back scar noted with mild scoliotic changes in the thoracic region.  Note left knee laxity on anterior drawer and with testing of MCL and LCL, good ROM post knee replacement.  Right knee with ability to go to full flexion, no hyperextension. 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

## 2013-05-26 NOTE — Patient Instructions (Signed)
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  You have had multiple arthritic changes in multiple joints related to likely joint subluxations and dislocations since you were younger. This may also explain the issues you have had in your spine. This would also explain the instability in your left knee. You now have severe arthritis in your right thumb and have a right shoulder labral tear, both of which are related to subluxations creating joint damage. We will refer you to the geneticist at [facility] for further evaluation of this issue. Please inform your surgeon at [facility] of this potential diagnosis. Please also be careful with movements of your right shoulder, especially with reaching behind your back.

## 2013-05-26 NOTE — Assessment & Plan Note (Signed)
Related to hypermobility of this joint. Patient fitted with a knee sleeve and advised to wear this during activities or prolonged periods of walking to help provide stability to this area.

## 2013-05-26 NOTE — Assessment & Plan Note (Signed)
Discussed with patient that surgery would not likely be beneficial at this point for this lesion. Discussed evaluation by genetics outlined in collagen vascular disease section.

## 2013-05-26 NOTE — Assessment & Plan Note (Signed)
Patient with most likely Ehlers Danlos hypermobility type given constellation of findings. Patient with multiple arthritic changes in multiple joints related to likely joint subluxations and dislocations since a young age. Patient with continued subluxations of her spine despite fusion. Patient with instability of her left knee despite surgical intervention. Patient with labral tear and arthritis in her right shoulder and right 1st CMC joints respectively. At this time will refer the patient to the geneticist at [facility] for further evaluation of this. Advised to let her surgeon at [facility] know of this potential diagnosis. Will formulate plan for further management of her variety of arthritic and pain complaints following upcoming back surgery and evaluation by genetics.

## 2013-08-06 ENCOUNTER — Telehealth: Payer: Self-pay | Admitting: *Deleted

## 2013-08-06 NOTE — Telephone Encounter (Signed)
Pt has appointment 02/12/14 at 8:30 am. 7th floor [address] [facility] [phone] phone  [phone] fax

## 2013-08-06 NOTE — Telephone Encounter (Signed)
Message copied by Mora BellmanMARTIN, Aeden Matranga C on Thu Aug 06, 2013  2:39 PM ------      Message from: AvimorMARTIN, VirginiaMY C      Created: Tue May 26, 2013  5:02 PM       Referral to Dr. Violet BaldyJewitt       ------

## 2013-09-24 ENCOUNTER — Other Ambulatory Visit: Payer: Self-pay

## 2013-09-24 DIAGNOSIS — Z1231 Encounter for screening mammogram for malignant neoplasm of breast: Secondary | ICD-10-CM

## 2013-10-29 ENCOUNTER — Ambulatory Visit: Payer: BC Managed Care – PPO

## 2013-11-13 ENCOUNTER — Ambulatory Visit
Admission: RE | Admit: 2013-11-13 | Discharge: 2013-11-13 | Disposition: A | Discharge status: Home / Self Care | Payer: BC Managed Care – PPO | Source: Ambulatory Visit

## 2013-11-13 ENCOUNTER — Encounter (INDEPENDENT_AMBULATORY_CARE_PROVIDER_SITE_OTHER): Payer: Self-pay

## 2013-11-13 DIAGNOSIS — Z1231 Encounter for screening mammogram for malignant neoplasm of breast: Secondary | ICD-10-CM

## 2013-11-16 ENCOUNTER — Encounter: Payer: Self-pay | Admitting: Diagnostic Neuroimaging

## 2013-11-16 ENCOUNTER — Ambulatory Visit (INDEPENDENT_AMBULATORY_CARE_PROVIDER_SITE_OTHER): Payer: BC Managed Care – PPO | Admitting: Diagnostic Neuroimaging

## 2013-11-16 VITALS — BP 130/85 | HR 65 | Ht 66.0 in | Wt 129.5 lb

## 2013-11-16 DIAGNOSIS — R413 Other amnesia: Secondary | ICD-10-CM

## 2013-11-16 DIAGNOSIS — G894 Chronic pain syndrome: Secondary | ICD-10-CM

## 2013-11-16 DIAGNOSIS — G2581 Restless legs syndrome: Secondary | ICD-10-CM

## 2013-11-16 DIAGNOSIS — G4700 Insomnia, unspecified: Secondary | ICD-10-CM

## 2013-11-16 NOTE — Progress Notes (Signed)
[facility]  PATIENT: Nicole HughesJulia G Mittal DOB: 17-Aug-1960  REFERRING CLINICIAN: S Deveshwar HISTORY FROM: patient  REASON FOR VISIT: new consult   HISTORICAL  CHIEF COMPLAINT:  Chief Complaint  Patient presents with  . Memory Loss    RLS    HISTORY OF PRESENT ILLNESS:   53 year old female, left-handed, with fibromyalgia, degenerative disc disease, osteoporosis, unspecified connective tissue disorder, here for evaluation of memory problems and restless leg symptoms for past one to 2 years. Patient noted intermittent problems forgetting recent events, having more difficulty playing bridge, forgetting it she has taken her pills or not. She has been repeating herself and some conversations. Patient continues to struggle with significant insomnia, snoring, pain. She's had numerous surgeries in the past from 1996 until 2014, mainly involving her spine and lower extremities.   Also having problems with restless legs, mainly in the evening and a she's falling asleep. She has tendency in feeling otherwise taken move her legs. Patient's brother also diagnosed with restless leg syndrome, doing well on extended release ropinirole.  REVIEW OF SYSTEMS: Full 14 system review of systems performed and notable only for insomnia sleepiness snoring restless legs memory loss easy bruising joint pain and cramps achy muscle chores about murmur.  ALLERGIES: No Known Allergies  HOME MEDICATIONS: Outpatient Prescriptions Prior to Visit  Medication Sig Dispense Refill  . calcium citrate-vitamin D (CITRACAL+D) 315-200 MG-UNIT per tablet Take 1 tablet by mouth 2 (two) times daily.      . ferrous fumarate (HEMOCYTE - 106 MG FE) 325 (106 FE) MG TABS Take 1 tablet by mouth daily.      . meloxicam (MOBIC) 15 MG tablet Take 15 mg by mouth daily.      . NON FORMULARY Apply 0.5 mLs topically every evening. biest topical hormone cream      . OVER THE COUNTER MEDICATION Take 3 tablets by mouth every  evening. instaflex-joint support      . progesterone (PROMETRIUM) 100 MG capsule Take 100 mg by mouth at bedtime.       . sertraline (ZOLOFT) 25 MG tablet Take 25 mg by mouth at bedtime.       Marland Kitchen. zolpidem (AMBIEN CR) 12.5 MG CR tablet Take 12.5 mg by mouth at bedtime as needed. Sleep      . cyclobenzaprine (FLEXERIL) 10 MG tablet Take 10 mg by mouth at bedtime. Muscle spasms      . cyclobenzaprine (FLEXERIL) 10 MG tablet Take 1 tablet (10 mg total) by mouth 3 (three) times daily as needed for muscle spasms.  30 tablet  0  . oxyCODONE (ROXICODONE) 5 MG immediate release tablet Take 1-2 tablets (5-10 mg total) by mouth every 4 (four) hours as needed for pain.  60 tablet  0   No facility-administered medications prior to visit.    PAST MEDICAL HISTORY: Past Medical History  Diagnosis Date  . Fibromyalgia   . Arthritis     osteo  . Anemia   . History of blood transfusion   . Complication of anesthesia     urinary retention requiring indwelling  foley catheterization  . DDD (degenerative disc disease)   . Osteoarthritis     PAST SURGICAL HISTORY: Past Surgical History  Procedure Laterality Date  . Back surgery      Cervical fusion C5-7, fusion  T10-S1  . Joint replacement      partial left knee 2005, total left knee 2012  . Eye surgery      bilateral tear  duct fusion  . Osteotomy      bilateral tibial  . Diagnostic laparoscopy      x 3  . Knee arthroscopy  08/06/2012    Procedure: ARTHROSCOPY KNEE;  Surgeon: Loanne DrillingFrank V Aluisio, MD;  Location: [facility];  Service: Orthopedics;  Laterality: Left;  WITH SYNOVECTOMY and debridement    FAMILY HISTORY: Family History  Problem Relation Age of Onset  . Stroke Mother     hemorrhagic    SOCIAL HISTORY:  History   Social History  . Marital Status: Married    Spouse Name: Leandra KernJoseph Lee    Number of Children: 3  . Years of Education: BA   Occupational History  .      After Disaster    Social History Main Topics  . Smoking status:  Never Smoker   . Smokeless tobacco: Never Used  . Alcohol Use: Yes     Comment: 3 glasses of wine weekly  . Drug Use: No  . Sexual Activity: Not on file   Other Topics Concern  . Not on file   Social History Narrative   Patient lives at home with family.   Caffeine Use: 4-5 sodas weekly     PHYSICAL EXAM  Filed Vitals:   11/16/13 1026  BP: 130/85  Pulse: 65  Height: 5\' 6"  (1.676 m)  Weight: 129 lb 8 oz (58.741 kg)    Not recorded    Body mass index is 20.91 kg/(m^2).  GENERAL EXAM: Patient is in no distress; well developed, nourished and groomed; neck is supple  CARDIOVASCULAR: Regular rate and rhythm, no murmurs, no carotid bruits  NEUROLOGIC: MENTAL STATUS: awake, alert, oriented to person, place and time, recent and remote memory intact, normal attention and concentration, language fluent, comprehension intact, naming intact, fund of knowledge appropriate; MMSE 29/30. MOCA 28/30. NO FRONTAL RELEASE SIGNS. AFT 18. CRANIAL NERVE: no papilledema on fundoscopic exam, pupils equal and reactive to light, visual fields full to confrontation, extraocular muscles intact, no nystagmus, facial sensation and strength symmetric, hearing intact, palate elevates symmetrically, uvula midline, shoulder shrug symmetric, tongue midline. MOTOR: normal bulk and tone, full strength in the BUE, BLE SENSORY: normal and symmetric to light touch, temperature, vibration COORDINATION: finger-nose-finger, fine finger movements normal REFLEXES: deep tendon reflexes present and symmetric GAIT/STATION: narrow based gait; able to walk tandem; romberg is negative    DIAGNOSTIC DATA (LABS, IMAGING, TESTING) - I reviewed patient records, labs, notes, testing and imaging myself where available.  Lab Results  Component Value Date   WBC 4.1 07/29/2012   HGB 12.3 07/29/2012   HCT 36.8 07/29/2012   MCV 87.6 07/29/2012   PLT 189 07/29/2012      Component Value Date/Time   NA 137 05/04/2011 0429   K  3.4* 05/04/2011 0429   CL 100 05/04/2011 0429   CO2 32 05/04/2011 0429   GLUCOSE 136* 05/04/2011 0429   BUN 10 05/04/2011 0429   CREATININE 0.52 05/04/2011 0429   CALCIUM 8.4 05/04/2011 0429   PROT 7.1 04/26/2011 1305   ALBUMIN 4.4 04/26/2011 1305   AST 13 04/26/2011 1305   ALT 10 04/26/2011 1305   ALKPHOS 91 04/26/2011 1305   BILITOT 0.3 04/26/2011 1305   GFRNONAA >90 05/04/2011 0429   GFRAA >90 05/04/2011 0429   No results found for this basename: CHOL, HDL, LDLCALC, LDLDIRECT, TRIG, CHOLHDL   No results found for this basename: HGBA1C   No results found for this basename: VITAMINB12   No results found for  this basename: TSH     ASSESSMENT AND PLAN  53 y.o. year old female here with mild memory loss and restless leg syndrome x 1 year. Patient reports normal iron levels, B12 and thyroid function testing.  Ddx: memory loss (likely due to chronic insomnia and chronic pain; cognitive testing normal today)  Ddx: restless legs (due to iron deficiency, B12 def, thyroid dz, anxiety, stress, genetic)  PLAN: - Consider increasing gabapentin to 600mg  twice a day (noon and bedtime) to help with RLS and pain - Consider sleep psychology evaluation for insomnia - patient agrees with plan, and wants to return here only as needed  Return if symptoms worsen or fail to improve, for return to Dr. Pollyann Savoy.    Suanne Marker, MD 11/16/2013, 11:42 AM Certified in Neurology, Neurophysiology and Neuroimaging  [facility] [address] [place], [place] [address] [phone]

## 2013-11-16 NOTE — Patient Instructions (Signed)
Consider increasing gabapentin to 600mg  twice a day (noon and bedtime).  Consider sleep psychology evaluation for insomnia.

## 2013-11-26 ENCOUNTER — Other Ambulatory Visit: Payer: Self-pay | Admitting: Obstetrics and Gynecology

## 2014-03-10 ENCOUNTER — Ambulatory Visit (INDEPENDENT_AMBULATORY_CARE_PROVIDER_SITE_OTHER): Payer: BC Managed Care – PPO | Admitting: Sports Medicine

## 2014-03-10 ENCOUNTER — Encounter: Payer: Self-pay | Admitting: Sports Medicine

## 2014-03-10 VITALS — BP 136/84 | Ht 66.0 in | Wt 130.0 lb

## 2014-03-10 DIAGNOSIS — M79671 Pain in right foot: Secondary | ICD-10-CM

## 2014-03-10 DIAGNOSIS — M79673 Pain in unspecified foot: Secondary | ICD-10-CM | POA: Insufficient documentation

## 2014-03-10 DIAGNOSIS — Q7962 Hypermobile Ehlers-Danlos syndrome: Secondary | ICD-10-CM

## 2014-03-10 DIAGNOSIS — Q796 Ehlers-Danlos syndrome, unspecified: Secondary | ICD-10-CM | POA: Diagnosis not present

## 2014-03-10 DIAGNOSIS — M79609 Pain in unspecified limb: Secondary | ICD-10-CM | POA: Diagnosis not present

## 2014-03-10 NOTE — Assessment & Plan Note (Addendum)
-  She is seeing [name], her geneticist at [facility] and has arranged followup visits with cardiology for an echo cardiogram. -If her sleep disturbance persists, we may consider changing her medication regimen to amitriptyline 25 mg by mouth each bedtime and trying to discontinue the methocarbamol and potentially Ambien. -She will continue the gabapentin 300 mg twice daily for now. -We will reassess in 1 month worse in her knee it.  Consider medication review if not responding

## 2014-03-10 NOTE — Progress Notes (Signed)
   Subjective:    Patient ID: Nicole Oneill, female    DOB: 1961/01/03, 53 y.o.   MRN: [id number]  HPI Nicole Oneill is a 53 year old female who presents with right medial foot pain. 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 She describes several months of electrical burning type pain from the mid longitudinal arch of the right foot extending to the interspace between the right first and second toes. She denies any known acute injury. Since the problem started has been worsening. Symptoms are aggravated depending on the type of footwear she is wearing. She is taking Neurontin 300 mg twice daily. She tried a Medrol Dosepak which provided temporary relief, but her symptoms soon returned. She denies any foot weakness, swelling, or bony point tenderness.  As far as her [name], she was just seen by the geneticist yesterday and diagnosed. History is significant for multiple surgeries including T1-S1 fusion, as well as cervical vertebrae effusion. She is also had a right partial knee replacement. She has had bilateral tibial osteotomies. From a pain standpoint she is overall fairly well controlled at this time. Her medication regimen consists of mainly Neurontin, methocarbamol, meloxicam, Ambien CR, and Zoloft for sleep and pain.  Past medical, social, medications, and allergies were reviewed and are up-to-date in the chart.  Review of Systems 11 point review of systems was performed is otherwise negative. Otherwise review of systems as per history of present illness.    Objective:   Physical Exam BP 136/84  Ht  (1.676 m)  Wt 130 lb (58.968 kg)  BMI 20.99 kg/m2 GEN: The patient is well-developed well-nourished female and in no acute distress.  She is awake alert and oriented x3. SKIN: warm and well-perfused, no rash  EXTR: No lower extremity edema or calf tenderness Neuro: Strength 5/5 globally. Sensation intact throughout. DTRs 2/4 bilaterally. No focal deficits. Vasc: +2  bilateral distal pulses. No edema.  MSK: Examination of the right foot reveals a Morton's foot with the prominence of the second toe. She has some decrease in the longitudinal arch of the right foot compared to left. Mobility at the subtalar joint is increased on the right. Gait analysis reveals a slight instability of the right ankle joint that wobbles with walking. She has tenderness to palpation at the first and second inter-web space, and pain along the medial longitudinal arch. There is no overlying erythema, induration, or swelling.      Assessment & Plan:  Please see problem based assessment and plan in the problem list.

## 2014-03-10 NOTE — Assessment & Plan Note (Addendum)
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 -We provided her a sport insole with a medial heel wedges. -We provided her a body helix compression sleeve for the right ankle, which demonstrated improved stability of the ankle with ambulation. -She will followup in one month, where we will assess her footwear to see what other shoes she may be we'll use the inserts in him that we may be able to modify.  This is possibly lumbo sacral radicular sxs but seems more likely to be a tarsal tunnel like compression. Cont the gabapentin

## 2014-04-13 ENCOUNTER — Ambulatory Visit: Payer: BC Managed Care – PPO | Admitting: Sports Medicine

## 2014-04-15 ENCOUNTER — Ambulatory Visit (INDEPENDENT_AMBULATORY_CARE_PROVIDER_SITE_OTHER): Payer: BC Managed Care – PPO | Admitting: Sports Medicine

## 2014-04-15 ENCOUNTER — Encounter: Payer: Self-pay | Admitting: Sports Medicine

## 2014-04-15 VITALS — BP 118/77 | HR 74 | Ht 66.0 in | Wt 130.0 lb

## 2014-04-15 DIAGNOSIS — M722 Plantar fascial fibromatosis: Secondary | ICD-10-CM | POA: Diagnosis not present

## 2014-04-15 DIAGNOSIS — M79671 Pain in right foot: Secondary | ICD-10-CM | POA: Diagnosis not present

## 2014-04-15 DIAGNOSIS — G609 Hereditary and idiopathic neuropathy, unspecified: Secondary | ICD-10-CM | POA: Diagnosis not present

## 2014-04-15 MED ORDER — GABAPENTIN 300 MG PO CAPS: 600.0000 mg | ORAL_CAPSULE | Freq: Three times a day (TID) | ORAL | Status: DC

## 2014-04-15 NOTE — Assessment & Plan Note (Signed)
Current pain seems less related to PF

## 2014-04-15 NOTE — Assessment & Plan Note (Signed)
I added additional arch padding  This seems more like tarsal tunnel sxs  Gradually increase dose of gabapentin to 600 tid  Reck 6 wks

## 2014-04-15 NOTE — Progress Notes (Signed)
Patient ID: Nicole Oneill, female   DOB: 04/10/1961, 53 y.o.   MRN: [id number]  Patient with RT foot pain ED type 3 with hypermobility We added heel wedge and sports insoles These helped but only about 40 to 50% less than at start Using ankle compression sleeve and this does feel better  Pain radiates down from heel area to plantar forefoot Gabapentin seems to help Use 300 bid  Mid thoriacic pain also This is along paraspinal MM Has had fusion from cervical to lumbar area  Subluxing TMJ with pain on chewing   Exam NAD BP 118/77  Pulse 74  Ht 5\' 6"  (1.676 m)  Wt 130 lb (58.968 kg)  BMI 20.99 kg/m2  RT foot shows loss of longitudinal arch starting at rear foot and mid foot No swelling No TTP at PF insertion No TTP on plantar forefoot Stands and walks in pronation  Mild spasm noted in paraspinous MM thoracic region

## 2014-04-15 NOTE — Patient Instructions (Signed)
Start taking gabapentin 300 three times a day for 1 week  Then 300/ 300 / 600 for 1 week  If foot pain stops/ stay at the given dose  Week 3 is 600/300/600  Week 4 600 three times per day  See me in 6 weeks  Test the arch support as much as possible/ use ankle support for walking or standing

## 2014-04-30 ENCOUNTER — Other Ambulatory Visit: Payer: Self-pay | Admitting: Gastroenterology

## 2014-05-18 ENCOUNTER — Other Ambulatory Visit: Payer: Self-pay | Admitting: Orthopedic Surgery

## 2014-05-18 DIAGNOSIS — Z981 Arthrodesis status: Secondary | ICD-10-CM

## 2014-05-18 DIAGNOSIS — M545 Low back pain, unspecified: Secondary | ICD-10-CM

## 2014-05-18 DIAGNOSIS — M79671 Pain in right foot: Secondary | ICD-10-CM

## 2014-05-25 ENCOUNTER — Ambulatory Visit
Admission: RE | Admit: 2014-05-25 | Discharge: 2014-05-25 | Disposition: A | Discharge status: Home / Self Care | Payer: BC Managed Care – PPO | Source: Ambulatory Visit | Attending: Orthopedic Surgery | Admitting: Orthopedic Surgery

## 2014-05-25 DIAGNOSIS — Z981 Arthrodesis status: Secondary | ICD-10-CM

## 2014-05-25 DIAGNOSIS — M545 Low back pain, unspecified: Secondary | ICD-10-CM

## 2014-05-25 DIAGNOSIS — M79671 Pain in right foot: Secondary | ICD-10-CM

## 2014-06-01 ENCOUNTER — Encounter: Payer: Self-pay | Admitting: Sports Medicine

## 2014-06-01 ENCOUNTER — Ambulatory Visit (INDEPENDENT_AMBULATORY_CARE_PROVIDER_SITE_OTHER): Payer: BC Managed Care – PPO | Admitting: Sports Medicine

## 2014-06-01 VITALS — BP 139/77

## 2014-06-01 DIAGNOSIS — M533 Sacrococcygeal disorders, not elsewhere classified: Secondary | ICD-10-CM

## 2014-06-01 DIAGNOSIS — G5751 Tarsal tunnel syndrome, right lower limb: Secondary | ICD-10-CM | POA: Diagnosis not present

## 2014-06-01 DIAGNOSIS — R2689 Other abnormalities of gait and mobility: Secondary | ICD-10-CM | POA: Diagnosis not present

## 2014-06-01 DIAGNOSIS — M25571 Pain in right ankle and joints of right foot: Secondary | ICD-10-CM

## 2014-06-01 MED ORDER — AMITRIPTYLINE HCL 25 MG PO TABS: 25.0000 mg | ORAL_TABLET | Freq: Every day | ORAL | Status: DC

## 2014-06-01 NOTE — Patient Instructions (Signed)
Gabapentin taper: 300mg  in the morning/300mg  at noon/600mg  at bedtime: for 4 days 300mg  in the morning/300mg  at noon/300mg  at bedtime:  for 4 days 300mg  a day for 4 days  We will have Nicole Oneill call you tmrrw to schedule your one month f/u

## 2014-06-02 DIAGNOSIS — M533 Sacrococcygeal disorders, not elsewhere classified: Secondary | ICD-10-CM | POA: Insufficient documentation

## 2014-06-02 DIAGNOSIS — R2689 Other abnormalities of gait and mobility: Secondary | ICD-10-CM | POA: Insufficient documentation

## 2014-06-02 DIAGNOSIS — M25571 Pain in right ankle and joints of right foot: Secondary | ICD-10-CM | POA: Insufficient documentation

## 2014-06-02 NOTE — Progress Notes (Signed)
Patient ID: Nicole HughesJulia G Oneill, female   DOB: [birth year], 53 y.o.   MRN: [id number]  Nicole HughesJulia G Oneill - 53 y.o. female MRN [id number]  Date of birth: [birth year]  SUBJECTIVE:  Including CC & ROS.  Patient with RT foot pain which we suspect is tarsal tunnel syndrome and a new pain at her right lower back/ pelvis Hx. Of ED type 3 with hypermobility and multiple level back fusion surgery Last visit in Oct 2015 pateint was placed in ankle compression sleeve and added heel wedge and sports insoles These helped but only about 40 to 50%  But have not provided adequate arch support Using ankle compression sleeve and this does feel better  Pain radiates down from heel area to plantar forefoot and has noted some nodules in her plantar foot Gabapentin seems to not make a big difference in foot pain even on 600mg  TID  ROS: Review of systems otherwise negative except for information present in HPI  HISTORY: Past Medical, Surgical, Social, and Family History Reviewed & Updated per EMR. Pertinent Historical Findings include: ED type 3, nonsmoker, depression  PHYSICAL EXAM:  VS: BP:139/77 mmHg  HR: bpm  TEMP: ( )  RESP:   HT:    WT:   BMI:  NAD, alert, normal respiration, flat affect BP 139/77 mmHg RT foot shows loss of longitudinal arch starting at rear foot and mid foot. 2 nodules within the tendon sheath of the flexor hallux and flexor digitorum likely ganglion cyst No swelling No TTP at PF insertion Stands and walks in pronation  Right SI joint TTP and hypermobility in this joint likely related to lumbar fusion cause SI joint to create most of the force for hip rotation in the pelvis. Global weakness in the hip girdle.  N/V intact with normal distal sensation and pulses  ASSESSMENT & PLAN: See problem based charting & AVS for pt instructions. Orthotic Fitting and Adjustment note: Patient was fitted for a : standard, cushioned, semi-rigid orthotic.  The orthotic was heated and afterward the patient stood  on the orthotic blank positioned on the orthotic stand.  The patient was positioned in subtalar neutral position and 10 degrees of ankle dorsiflexion in a weight bearing stance.  After completion of molding, a stable base was applied to the orthotic blank.  The blank was ground to a stable position for weight bearing.  Size: 8 Base: Blue EVA Posting: none Additional orthotic padding: none  Greater than 50% of the patient's visit for a total of 30 minutes, was spent conducting face-to-face counseling for bilateral foot orthotics fitting and constructing

## 2014-06-02 NOTE — Assessment & Plan Note (Signed)
Suspect this is related to hypermobility and dysfunction in pelvic motion given her history of lumbar fusion which limits lumbar rotation and forces the pelvis to provide functional rotation of the hip girdle with walking  Recommendations: Titrate off gabapentin after several weeks of no clinical improvement and no worsening neuropathic pain. Patient given titration instruction Plan to start amitriptyline 25mg  after getting of gabapentin to help with pain and for muscle spasms in the low back and pelvic girdle.  F/u in 4 weeks

## 2014-06-02 NOTE — Assessment & Plan Note (Signed)
Recommended additional arch support with custom orthotic today and continue compression sleeve at the ankle

## 2014-06-17 ENCOUNTER — Ambulatory Visit (INDEPENDENT_AMBULATORY_CARE_PROVIDER_SITE_OTHER): Payer: BC Managed Care – PPO | Admitting: Cardiovascular Disease

## 2014-06-17 ENCOUNTER — Encounter: Payer: Self-pay | Admitting: Cardiovascular Disease

## 2014-06-17 VITALS — BP 117/78 | HR 63 | Ht 66.0 in | Wt 133.3 lb

## 2014-06-17 DIAGNOSIS — Q7962 Hypermobile Ehlers-Danlos syndrome: Secondary | ICD-10-CM

## 2014-06-17 DIAGNOSIS — R011 Cardiac murmur, unspecified: Secondary | ICD-10-CM

## 2014-06-17 DIAGNOSIS — Q796 Ehlers-Danlos syndrome: Secondary | ICD-10-CM

## 2014-06-17 NOTE — Patient Instructions (Signed)
Your physician has requested that you have an echocardiogram. Echocardiography is a painless test that uses sound waves to create images of your heart. It provides your doctor with information about the size and shape of your heart and how well your heart's chambers and valves are working. This procedure takes approximately one hour. There are no restrictions for this procedure.  Your physician recommends that you schedule a follow-up appointment in: As needed with Dr. Tresa EndoKelly.

## 2014-06-19 DIAGNOSIS — R011 Cardiac murmur, unspecified: Secondary | ICD-10-CM | POA: Insufficient documentation

## 2014-06-19 NOTE — Progress Notes (Signed)
Patient ID: YARED SUSAN, female   DOB: 06/12/1961, 53 y.o.   MRN: [id number]     PATIENT PROFILE: NIREL BABLER is a 53 y.o. female who presents to the office today to reestablish cardiology care with me.  I had last seen her in 2007.   HPI:  AVERY EUSTICE is a 53 y.o. female who is had significant orthopedic issues, a presumptive history of fibromyalgia, as well as Sjogren syndrome.  I had seen her in 2007 when she was experiencing episodes of palpitations and vague chest pain.  Prior to that visit, she had undergone numerous orthopedic procedures with cervical as well as lumbar fusions in addition to knee replacement.  An echo Doppler study in November 2007 revealed normal LV size and function.  She had mild mitral annular calcification.  Her mitral valve was not my myxomatous in appearance and she had evidence for mild mitral regurgitation.  There was trivial tricuspid regurgitation.  Her aortic root dimension was normal and measured 2.3 cm in diameter.  A nuclear perfusion study showed normal perfusion in all regions.  A cardiac monitor showed occasional episodes of sinus tachycardia as well as episodes of PACs, and short bursts of atrial tachycardia without ventricular ectopy.  Over the past 8 years, Ms. Bowdoin has undergone numerous surgeries.  She states she is had at least 18 surgeries in the last 12 years and she recently underwent complete fusion of her back at [facility].  Due to her hypermobility, osteoarthritis, intervertebral  disc disease and joint pains she ultimately was referred by Dr Roanna Epley to [name] at [facility].  She was diagnosed with Ehlers-Danlos syndrome, hypermobility type III. Marland Kitchen  She also was seen in the genetics clinic evaluation.  Because of the association of aortic root dilatation.  She now presents for cardiologic reevaluation.  [name] denies any chest pain.  She is unaware of recent palpitations.  She denies presyncope or syncope.  It was recommended by [name]  that she take take vitamin D and calcium supplements, vitamin C 1000 mg daily , which aids in the cross-linking of collagen fibrils and prolongs life of normal collagen, Epsom salt baths when she has musculoskeletal pain as well as potential oral magnesium supplementation.  Past Medical History  Diagnosis Date  . Fibromyalgia   . Arthritis     osteo  . Anemia   . History of blood transfusion   . Complication of anesthesia     urinary retention requiring indwelling  foley catheterization  . DDD (degenerative disc disease)   . Osteoarthritis   . Ehlers-Danlos syndrome type III     Past Surgical History  Procedure Laterality Date  . Back surgery      Cervical fusion C5-7, fusion  T10-S1  . Joint replacement      partial left knee 2005, total left knee 2012  . Eye surgery      bilateral tear duct fusion  . Osteotomy      bilateral tibial  . Diagnostic laparoscopy      x 3  . Knee arthroscopy  08/06/2012    Procedure: ARTHROSCOPY KNEE;  Surgeon: Loanne Drilling, MD;  Location: [facility];  Service: Orthopedics;  Laterality: Left;  WITH SYNOVECTOMY and debridement    No Known Allergies  Current Outpatient Prescriptions  Medication Sig Dispense Refill  . amitriptyline (ELAVIL) 25 MG tablet Take 1 tablet (25 mg total) by mouth at bedtime. 30 tablet 1  . calcium citrate-vitamin D (CITRACAL+D)  315-200 MG-UNIT per tablet Take 1 tablet by mouth 2 (two) times daily.    . Estradiol 0.5 MG/0.5GM GEL Place onto the skin daily.    . ferrous fumarate (HEMOCYTE - 106 MG FE) 325 (106 FE) MG TABS Take 1 tablet by mouth daily.    Marland Kitchen. gabapentin (NEURONTIN) 300 MG capsule Take 2 capsules (600 mg total) by mouth 3 (three) times daily. (Patient taking differently: Take 300 mg by mouth daily. ) 180 capsule 3  . meloxicam (MOBIC) 15 MG tablet   4  . methocarbamol (ROBAXIN) 750 MG tablet Take 750 mg by mouth at bedtime.    . NON FORMULARY Apply 0.5 mLs topically every evening. biest topical hormone cream      . progesterone (PROMETRIUM) 100 MG capsule Take 100 mg by mouth at bedtime.     . sertraline (ZOLOFT) 50 MG tablet Take 50 mg by mouth at bedtime.   4  . UNABLE TO FIND Med Name: INSTAFLEXTake 3 capsules at night    . zolpidem (AMBIEN CR) 12.5 MG CR tablet Take 12.5 mg by mouth at bedtime as needed. Sleep     No current facility-administered medications for this visit.    Socially she is married.  She has 3 children.  Her youngest daughter just started college [employer].  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  She is another daughter who teaches in [place].  Family History  Problem Relation Age of Onset  . Stroke Mother     hemorrhagic  . Hypertension Father   . Cancer - Prostate Father   . Anorexia nervosa Sister   . Heart attack Maternal Grandmother   . Stroke Maternal Grandfather   . Cancer - Colon Paternal Grandmother   . Cancer - Colon Paternal Grandfather     ROS General: Negative; No fevers, chills, or night sweats HEENT: Negative; No changes in vision or hearing, sinus congestion, difficulty swallowing Pulmonary: Negative; No cough, wheezing, shortness of breath, hemoptysis Cardiovascular:  See HPI; No chest pain, presyncope, syncope, palpitations, edema GI: Negative; No nausea, vomiting, diarrhea, or abdominal pain GU: Negative; No dysuria, hematuria, or difficulty voiding Musculoskeletal: Positive for numerous orthopedic surgeries as outlined above. Hematologic/Oncologic: Negative; no easy bruising, bleeding Endocrine: Negative; no heat/cold intolerance; no diabetes Neuro: Negative; no changes in balance, headaches Skin: Negative; No rashes or skin lesions Psychiatric: Negative; No behavioral problems, depression Sleep: She takes zolpidem 12.5 mg CR as needed to assist with sleep initiation and maintenance; No daytime sleepiness, hypersomnolence, bruxism, restless legs, hypnogagnic hallucinations Other  comprehensive 14 point system review is negative   Physical Exam BP 117/78 mmHg  Pulse 63  Ht 5\' 6"  (1.676 m)  Wt 133 lb 4.8 oz (60.464 kg)  BMI 21.53 kg/m2 General: Alert, oriented, no distress.  Skin: normal turgor, no rashes, warm and dry HEENT: Normocephalic, atraumatic. Pupils equal round and reactive to light; sclera anicteric; extraocular muscles intact; Fundi without hemorrhages or exudates.  Sharp discs. Nose without nasal septal hypertrophy Mouth/Parynx benign; Mallinpatti scale 2 Neck: No JVD, no carotid bruits; normal carotid upstroke Lungs: clear to ausculatation and percussion; no wheezing or rales Chest wall: without tenderness to palpitation Heart: PMI not displaced, RRR, s1 s2 normal, 1/6 systolic murmur, no diastolic murmur, no systolic click, rubs, gallops, thrills, or heaves Abdomen: soft, nontender; no hepatosplenomehaly, BS+; abdominal aorta nontender and not dilated by palpation. Back: no CVA tenderness; scar down her entire spine from her T1-S1  fusion. Pulses 2+ Musculoskeletal: full range of motion, normal strength, no joint deformities Extremities: no clubbing cyanosis or edema, Homan's sign negative  Neurologic: grossly nonfocal; Cranial nerves grossly wnl Psychologic: Normal mood and affect   ECG (independently read by me): Normal sinus rhythm at 63 bpm.  Poor progression V1 through V4.  QTc interval 43 ms.  PR interval 180 ms.  LABS:  BMET    Component Value Date/Time   NA 137 05/04/2011 0429   K 3.4* 05/04/2011 0429   CL 100 05/04/2011 0429   CO2 32 05/04/2011 0429   GLUCOSE 136* 05/04/2011 0429   BUN 10 05/04/2011 0429   CREATININE 0.52 05/04/2011 0429   CALCIUM 8.4 05/04/2011 0429   GFRNONAA >90 05/04/2011 0429   GFRAA >90 05/04/2011 0429     Hepatic Function Panel     Component Value Date/Time   PROT 7.1 04/26/2011 1305   ALBUMIN 4.4 04/26/2011 1305   AST 13 04/26/2011 1305   ALT 10 04/26/2011 1305   ALKPHOS 91 04/26/2011 1305    BILITOT 0.3 04/26/2011 1305     CBC    Component Value Date/Time   WBC 4.1 07/29/2012 1045   RBC 4.20 07/29/2012 1045   HGB 12.3 07/29/2012 1045   HCT 36.8 07/29/2012 1045   PLT 189 07/29/2012 1045   MCV 87.6 07/29/2012 1045   MCH 29.3 07/29/2012 1045   MCHC 33.4 07/29/2012 1045   RDW 12.8 07/29/2012 1045   LYMPHSABS 1.4 10/17/2008 1100   MONOABS 0.4 10/17/2008 1100   EOSABS 0.1 10/17/2008 1100   BASOSABS 0.0 10/17/2008 1100     BNP No results found for: PROBNP  Lipid Panel  No results found for: CHOL, TRIG, HDL, CHOLHDL, VLDL, LDLCALC, LDLDIRECT    RADIOLOGY: Ct Lumbar Spine Wo Contrast  05/25/2014   CLINICAL DATA:  Prior thoracolumbar fusion to the sacrum. Right-sided low back and right foot pain.  EXAM: CT LUMBAR SPINE WITHOUT CONTRAST  TECHNIQUE: Multidetector CT imaging of the lumbar spine was performed without intravenous contrast administration. Multiplanar CT image reconstructions were also generated.  COMPARISON:  11/24/2010  FINDINGS: Sequelae of prior posterior thoracolumbar fusion is again seen extending to the sacrum. This has been extended cranially since the prior CT, with its full superior extent incompletely imaged on this examination. New bilateral pedicle screws are now present at T11, T12, and L1. Pedicle screws remain in place bilaterally at L2, L3, and S1 and on the right at L4. A screw fragment remains on the right at L5. Screws are also noted in the iliac bones bilaterally. There is no evidence of screw loosening.  Solid posterior fusion masses are present in the lumbar spine. There is solid osseous fusion inferiorly on the right extending from the lower lumbar spine and sacrum to the right ilium. Fusion appears less robust on the left, however there is likely a small amount of bone bridging from the left sacral ala across the SI joint to the ilium. There is osseous fusion across the L1-2 disc space. Solid fusion is suspected across the L2-3 disc space. Solid  interbody fusion is again seen at L4-5 and L5-S1.  There is no evidence of compression fracture. There is mild lumbar levoscoliosis. Grade 1 retrolisthesis of L2 on L3 measures approximately 5 mm, and grade 1 anterolisthesis of L3 on L4 measures approximately 3 mm, similar to the prior CT.  L1-2: Interval fusion. Resolution of degenerative vertebral body sclerosis about the endplates. Interval posterior decompression without evidence of residual  spinal canal or neural foraminal stenosis.  L2-3: Prior posterior decompression and fusion. Spinal canal appears decompressed. No definite neural foraminal stenosis.  L3-4: Prior posterior decompression and fusion. Minimal endplate spurring without evidence of stenosis.  L4-5: Prior posterior decompression and fusion. No evidence of stenosis.  L5-S1: Prior posterior decompression and fusion. Minimal endplate spurring without evidence of stenosis.  IMPRESSION: 1. Interval superior extension of posterior fusion as above. Solid fusion inferiorly on the right between the lower lumbar spine, sacrum, and ilium with less robust effusion between the left sacrum and ilium. 2. Interval posterior decompression at L1-2 without residual stenosis.   Electronically Signed   By: Sebastian Ache   On: 05/25/2014 17:57     ASSESSMENT AND PLAN: Ms. Heckart is a very pleasant 53 year old female who has had lifelong issues with hypermobility, and has had extensive orthopedic surgeries.  She recently was diagnosed with her Ehlers-Danlos syndrome -Hypermobility type (EDS-HT).  In November 2007.  She had undergone a 2-D echo Doppler study which showed normal systolic function and normal diastolic function.  She did not have mitral valve prolapse and there was evidence for mild mitral regurgitation and trace tricuspid insufficiency.  At that time, her aortic root dimension was normal and measured 2.3 cm.  She had experienced vague chest pain and  a nuclear perfusion study performed prior to her  reconstructive knee surgery revealed normal perfusion.  She denies having any recurrent chest pain presently.  She denies any awareness of recurrent palpitations.  With her recent diagnosis of EDD-HT  I will schedule her for an 8 year follow-up echo Doppler study to further evaluate her cardiac function, valvular architecture, and aortic root.  I will contact her regarding the results and will be available as-needed.   Lennette Bihari, MD, [facility] 06/19/2014 5:33 PM

## 2014-06-22 ENCOUNTER — Ambulatory Visit (HOSPITAL_COMMUNITY)
Admission: RE | Admit: 2014-06-22 | Discharge: 2014-06-22 | Disposition: A | Discharge status: Home / Self Care | Payer: BC Managed Care – PPO | Source: Ambulatory Visit | Attending: Cardiovascular Disease | Admitting: Cardiovascular Disease

## 2014-06-22 DIAGNOSIS — Q7962 Hypermobile Ehlers-Danlos syndrome: Secondary | ICD-10-CM

## 2014-06-22 DIAGNOSIS — R011 Cardiac murmur, unspecified: Secondary | ICD-10-CM | POA: Insufficient documentation

## 2014-06-22 DIAGNOSIS — Q796 Ehlers-Danlos syndrome: Secondary | ICD-10-CM | POA: Diagnosis not present

## 2014-06-22 NOTE — Progress Notes (Signed)
2D Echocardiogram Complete.  06/22/2014   Anthoni Geerts, RDCS  

## 2014-06-28 ENCOUNTER — Encounter: Payer: Self-pay | Admitting: Sports Medicine

## 2014-06-28 ENCOUNTER — Ambulatory Visit (INDEPENDENT_AMBULATORY_CARE_PROVIDER_SITE_OTHER): Payer: BC Managed Care – PPO | Admitting: Sports Medicine

## 2014-06-28 VITALS — BP 126/85 | HR 80 | Ht 66.0 in | Wt 133.0 lb

## 2014-06-28 DIAGNOSIS — M79671 Pain in right foot: Secondary | ICD-10-CM | POA: Diagnosis not present

## 2014-06-28 DIAGNOSIS — M25571 Pain in right ankle and joints of right foot: Secondary | ICD-10-CM

## 2014-06-28 DIAGNOSIS — G5751 Tarsal tunnel syndrome, right lower limb: Secondary | ICD-10-CM | POA: Diagnosis not present

## 2014-06-28 MED ORDER — AMITRIPTYLINE HCL 25 MG PO TABS: 25.0000 mg | ORAL_TABLET | Freq: Every day | ORAL | Status: DC

## 2014-06-28 NOTE — Assessment & Plan Note (Signed)
Much less swelling since orthotics placed

## 2014-06-28 NOTE — Progress Notes (Signed)
   Subjective:    Patient ID: Nicole HughesJulia G Rieth, female    DOB: 1960/10/07, 53 y.o.   MRN: [id number]  HPI  RIGHT FOOT PAIN: - Patient presents for follow-up for medial plantar foot pain, last seen on 06/01/14, with known diagnosis of EDS-III, at that time had custom orthotics made for additional arch support bilaterally, also transitioned off of Gabapentin 600mg  TID to Elavil 25mg  qhs for neuropathic pain in setting of chronic back / SI problems with EDS - Today patient reports overall significant improvement from her last visit. Reports that her "nerve pain is mostly gone", overall "feels great". Admits to only experiencing rare sharp pains over Right medial midfoot when not wearing orthotics, otherwise custom orthotics for tennis shoes have been successful. Interested in a new pair of custom orthotics for her dress shoes and boots. Additionally, wears ankle compression sleeve with some relief, occasionally feels like it causes some pain. - Denies any joint pain, swelling  I have reviewed and updated the following as appropriate: allergies and current medications  Social Hx: - Never smoker  Review of Systems  See above HPI    Objective:   Physical Exam  BP 126/85 mmHg  Pulse 80  Ht 5\' 6"  (1.676 m)  Wt 133 lb (60.328 kg)  BMI 21.48 kg/m2  Gen - well-appearing, NAD MSK: - Bilateral Feet: Bilateral loss of longitudinal arches, bilateral morton's feet with great toe insufficiency, minimal tenderness reproduced plantar medial aspect of 1st MTP joint, otherwise full ROM, muscle str ankle 5/5 flex, ext, inv, eversion Ext - no edema, peripheral pulses intact +2 b/l Skin - warm, dry     Assessment & Plan:   [age] yr F with complicated PMH EDS-III, s/p spinal fusion C5-7, T10-S1, bilateral knee surgeries (R-osteotomy, L-TKR) presents for follow-up of Right medial plantar foot pain with significant resolution of symptoms with custom orthotic and switch from Gabapentin to Elavil. Currently  symptoms mostly resolved, without further neuropathic pain.  Plan: 1. Continue and may increase Elavil from 25mg  qhs to 25-50mg  qhs (#60, 5 refills) for further improvement if needed. 2. Continue custom orthotics with arch support. 3. Today provided arch support/scaphoid pads (trimmed) for arch support on her dress shoe insoles 4. RTC with boots and will re-schedule apt to consider making new dress shoe insoles  Saralyn PilarAlexander Karamalegos, DO [facility], PGY-2  Reviewed and agree Sterling BigKB Fields, MD

## 2014-06-28 NOTE — Assessment & Plan Note (Signed)
Probable tarsal tunnel  Arch support and elavil really helping  Cont these  Reck to work on shoes she can use for work [employer]

## 2014-06-30 ENCOUNTER — Encounter: Payer: Self-pay | Admitting: *Deleted

## 2014-07-08 ENCOUNTER — Telehealth: Payer: Self-pay | Admitting: Cardiovascular Disease

## 2014-07-08 NOTE — Telephone Encounter (Signed)
Spoke to pt about results. She voiced understanding.

## 2014-07-08 NOTE — Telephone Encounter (Signed)
Pt called in stating that she had a test done on 12/15 and would like to know the results. Please call   Thanks

## 2014-09-08 ENCOUNTER — Telehealth: Payer: Self-pay | Admitting: *Deleted

## 2014-09-08 NOTE — Telephone Encounter (Signed)
-----   Message from Enid BaasKarl Fields, MD sent at 09/07/2014  1:40 PM EST ----- Regarding: RE: question about amitriptyline Let's increase her to 75 mgm and after a week can go to 100 if needed.  If still with problems let's see her in office. ----- Message -----    From: Linward Headlandhea N Link Burgeson, RN    Sent: 09/03/2014  11:00 AM      To: Enid BaasKarl Fields, MD, Linward Headlandhea N Cisco Kindt, RN Subject: question about amitriptyline                   Pt still hurting, even after taking 50mg  of elavil Wants to know if she needs to increase dose or come in for appt?

## 2014-09-08 NOTE — Telephone Encounter (Signed)
Spoke with pt and she will up her dose to 75mg  and see how she feels.  Will call Nicole Oneill back if need be

## 2014-10-11 ENCOUNTER — Other Ambulatory Visit: Payer: Self-pay

## 2014-10-11 DIAGNOSIS — Z1231 Encounter for screening mammogram for malignant neoplasm of breast: Secondary | ICD-10-CM

## 2014-11-15 ENCOUNTER — Ambulatory Visit: Payer: Self-pay

## 2014-11-16 ENCOUNTER — Encounter (INDEPENDENT_AMBULATORY_CARE_PROVIDER_SITE_OTHER): Payer: Self-pay

## 2014-11-16 ENCOUNTER — Ambulatory Visit
Admission: RE | Admit: 2014-11-16 | Discharge: 2014-11-16 | Disposition: A | Discharge status: Home / Self Care | Payer: BLUE CROSS/BLUE SHIELD | Source: Ambulatory Visit

## 2014-11-16 DIAGNOSIS — Z1231 Encounter for screening mammogram for malignant neoplasm of breast: Secondary | ICD-10-CM

## 2014-12-02 ENCOUNTER — Other Ambulatory Visit: Payer: Self-pay | Admitting: Obstetrics and Gynecology

## 2014-12-07 LAB — CYTOLOGY - PAP

## 2015-01-04 ENCOUNTER — Ambulatory Visit: Payer: BLUE CROSS/BLUE SHIELD | Admitting: Sports Medicine

## 2015-01-13 ENCOUNTER — Other Ambulatory Visit: Payer: Self-pay | Admitting: Family Medicine

## 2015-01-13 ENCOUNTER — Other Ambulatory Visit: Payer: Self-pay | Admitting: *Deleted

## 2015-01-13 MED ORDER — AMITRIPTYLINE HCL 25 MG PO TABS: ORAL_TABLET | ORAL | Status: DC

## 2015-04-12 ENCOUNTER — Ambulatory Visit (INDEPENDENT_AMBULATORY_CARE_PROVIDER_SITE_OTHER): Payer: BLUE CROSS/BLUE SHIELD | Admitting: Sports Medicine

## 2015-04-12 ENCOUNTER — Encounter: Payer: Self-pay | Admitting: Sports Medicine

## 2015-04-12 VITALS — BP 123/64 | Ht 65.0 in | Wt 130.0 lb

## 2015-04-12 DIAGNOSIS — M79673 Pain in unspecified foot: Secondary | ICD-10-CM

## 2015-04-12 NOTE — Progress Notes (Signed)
   Subjective:    Patient ID: Nicole Oneill, female    DOB: 1961-02-03, 54 y.o.   MRN: [id number]  HPI 54 y/o female with PMHx significant for [name] Danlos Type III, who p/w right foot pain that has been ongoing for 2 months.  She was previously seen last December, given orthotics, which did help for several months.  She has been mainly on medial aspect of foot.  She has been walking and using stationary bike without pain.  However, it bothers her most when she is driving.  Not bothersome at rest.  She has noticed improvement with dry needling and massage.  She also brings in boots for some arch support placement today.      Review of Systems MSK: +foot pain, no joint swelling Neuro: denies numbness, paresthesias No rash No swelling    Objective:   Physical Exam  Constitutional: She is oriented to person, place, and time. She appears well-developed and well-nourished.  Neurological: She is alert and oriented to person, place, and time.  Skin: Skin is warm and dry.  Psychiatric: She has a normal mood and affect. Her behavior is normal. Judgment and thought content normal.   BP 123/64 mmHg  Ht  (1.651 m)  Wt 130 lb (58.968 kg)  BMI 21.63 kg/m2  MSK: Foot- Inspection- No erythema or swelling present, has neutral arches, has Morton's foot bilaterally   Weightbearing- pronation of bilateral feet, with fallen long arches   Palpation- mild TTP on navicular bone, proximal 1st MTP, no other TTP   ROM- Full ROM of 1st MTP, dorsiflexion, inversion bilaterally.  Has pain with plantarflexion and eversion in right foot.    Muscle strength intact in bilateral ankles   Neurovascular- sensation and distal pulses intact       Assessment & Plan:  Right foot pain- on medial arch, likely secondary to muscular strain.  Continue dry needling. Gave exercises in office- recommend moving 10 marbles with feet and dragging towel 10 times in a row.  Would recommend trying to walk on toes.  Scaphoid pads  placed in boots to help with arch support.  Placed additional medial support to first ray and more support to medial calcaneus to help with pronation.  Follow up PRN.

## 2015-04-12 NOTE — Assessment & Plan Note (Signed)
We modified orthotics to give more long arch support  In other shoes we will add scaphoid pads  I think her mobility causes less intrinsic foot support and thus more muscular support needed So we need to add some attempt at arch strength exercises as well

## 2015-04-21 ENCOUNTER — Ambulatory Visit: Payer: BLUE CROSS/BLUE SHIELD | Admitting: Sports Medicine

## 2015-04-28 ENCOUNTER — Other Ambulatory Visit: Payer: Self-pay | Admitting: Family Medicine

## 2015-05-10 ENCOUNTER — Ambulatory Visit: Payer: BLUE CROSS/BLUE SHIELD | Admitting: Sports Medicine

## 2015-10-17 ENCOUNTER — Other Ambulatory Visit: Payer: Self-pay

## 2015-10-17 DIAGNOSIS — Z1231 Encounter for screening mammogram for malignant neoplasm of breast: Secondary | ICD-10-CM

## 2015-10-22 DIAGNOSIS — J0100 Acute maxillary sinusitis, unspecified: Secondary | ICD-10-CM | POA: Diagnosis not present

## 2015-11-01 DIAGNOSIS — M5187 Other intervertebral disc disorders, lumbosacral region: Secondary | ICD-10-CM | POA: Diagnosis not present

## 2015-11-01 DIAGNOSIS — M546 Pain in thoracic spine: Secondary | ICD-10-CM | POA: Diagnosis not present

## 2015-11-01 DIAGNOSIS — M19241 Secondary osteoarthritis, right hand: Secondary | ICD-10-CM | POA: Diagnosis not present

## 2015-11-01 DIAGNOSIS — M3500 Sicca syndrome, unspecified: Secondary | ICD-10-CM | POA: Diagnosis not present

## 2015-11-02 DIAGNOSIS — Z79899 Other long term (current) drug therapy: Secondary | ICD-10-CM | POA: Diagnosis not present

## 2015-11-02 DIAGNOSIS — M792 Neuralgia and neuritis, unspecified: Secondary | ICD-10-CM | POA: Diagnosis not present

## 2015-11-02 DIAGNOSIS — M25559 Pain in unspecified hip: Secondary | ICD-10-CM | POA: Diagnosis not present

## 2015-11-21 ENCOUNTER — Ambulatory Visit
Admission: RE | Admit: 2015-11-21 | Discharge: 2015-11-21 | Disposition: A | Discharge status: Home / Self Care | Payer: BLUE CROSS/BLUE SHIELD | Source: Ambulatory Visit

## 2015-11-21 DIAGNOSIS — Z1231 Encounter for screening mammogram for malignant neoplasm of breast: Secondary | ICD-10-CM

## 2015-11-23 DIAGNOSIS — M546 Pain in thoracic spine: Secondary | ICD-10-CM | POA: Diagnosis not present

## 2015-12-08 DIAGNOSIS — M546 Pain in thoracic spine: Secondary | ICD-10-CM | POA: Diagnosis not present

## 2015-12-10 DIAGNOSIS — L03115 Cellulitis of right lower limb: Secondary | ICD-10-CM | POA: Diagnosis not present

## 2015-12-15 DIAGNOSIS — K0381 Cracked tooth: Secondary | ICD-10-CM | POA: Diagnosis not present

## 2015-12-21 DIAGNOSIS — R110 Nausea: Secondary | ICD-10-CM | POA: Diagnosis not present

## 2015-12-21 DIAGNOSIS — L03115 Cellulitis of right lower limb: Secondary | ICD-10-CM | POA: Diagnosis not present

## 2015-12-21 DIAGNOSIS — K5900 Constipation, unspecified: Secondary | ICD-10-CM | POA: Diagnosis not present

## 2016-01-23 DIAGNOSIS — Z01419 Encounter for gynecological examination (general) (routine) without abnormal findings: Secondary | ICD-10-CM | POA: Diagnosis not present

## 2016-01-23 DIAGNOSIS — Z13820 Encounter for screening for osteoporosis: Secondary | ICD-10-CM | POA: Diagnosis not present

## 2016-01-23 DIAGNOSIS — Z6821 Body mass index (BMI) 21.0-21.9, adult: Secondary | ICD-10-CM | POA: Diagnosis not present

## 2016-01-25 ENCOUNTER — Encounter: Payer: Self-pay | Admitting: Sports Medicine

## 2016-01-25 ENCOUNTER — Ambulatory Visit (INDEPENDENT_AMBULATORY_CARE_PROVIDER_SITE_OTHER): Payer: BLUE CROSS/BLUE SHIELD | Admitting: Sports Medicine

## 2016-01-25 VITALS — BP 140/75 | HR 73 | Ht 65.0 in | Wt 130.0 lb

## 2016-01-25 DIAGNOSIS — G5751 Tarsal tunnel syndrome, right lower limb: Secondary | ICD-10-CM

## 2016-01-25 DIAGNOSIS — M79673 Pain in unspecified foot: Secondary | ICD-10-CM

## 2016-01-25 DIAGNOSIS — M533 Sacrococcygeal disorders, not elsewhere classified: Secondary | ICD-10-CM | POA: Diagnosis not present

## 2016-01-25 DIAGNOSIS — M25571 Pain in right ankle and joints of right foot: Secondary | ICD-10-CM

## 2016-01-25 MED ORDER — GABAPENTIN 300 MG PO CAPS: 300.0000 mg | ORAL_CAPSULE | Freq: Three times a day (TID) | ORAL | Status: DC

## 2016-01-25 NOTE — Assessment & Plan Note (Signed)
Swelling in the sinus tarsi has resolved with orthotics

## 2016-01-25 NOTE — Progress Notes (Signed)
Patient ID: Nicole Oneill, female   Nicole HughesDOB: [birth year], 55 y.o.   MRN: [id number]  Chief complaint-pain that feels neurogenic radiating down her right leg  Patient has Ehlers-Danlos syndrome She has had fusion of her back from T1-S1 level In the past she had sciatic and nerve irritation Recently she has been traveling more and did some horseback riding at [place] She is having pain that radiates from the lateral hip to the back of the knee and into the foot This is not associated with a specific injury This is not associated with a specific area of tenderness  We had her on amitriptyline but she had a lot of weight gain She stopped the amitriptyline before the pain started  Past medical history Significant for multiple surgeries including a total knee replacement on the left A wedge osteotomy on the right knee A cervical fusion  Review of systems Generally her joints are improved with less CMC pain now that she has fusions Foot and ankle pain had been relieved with orthotic use  Physical examination No acute distress BP 140/75 mmHg  Pulse 73  Ht 5\' 5"  (1.651 m)  Wt 130 lb (58.968 kg)  BMI 21.63 kg/m2  Scar extends from her sacral area up the midline of her spine to the neck Flexion and extension of the back without pain Lateral bend and rotation without pain SI joints both move and do not produce pain today Hip abduction strength is good Hip flexion strength good Mild tenderness to palpation at the right greater trochanter Toe and heel walk without problems FABER neg

## 2016-01-25 NOTE — Assessment & Plan Note (Signed)
Improved with orthotics and she should continue these

## 2016-01-25 NOTE — Assessment & Plan Note (Signed)
I suspect her radiating pain is neurogenic This may be coming from the right SI joint as in the past A second option is that it's coming from the lumbar fusion  We will restart her on gabapentin 300 at night since his helped in the past We will increase the dose if needed to 3 times a day  Stop amitriptyline but consider another try cyclic if the gabapentin does not help

## 2016-03-07 ENCOUNTER — Ambulatory Visit (INDEPENDENT_AMBULATORY_CARE_PROVIDER_SITE_OTHER): Payer: BLUE CROSS/BLUE SHIELD | Admitting: Sports Medicine

## 2016-03-07 ENCOUNTER — Encounter: Payer: Self-pay | Admitting: Sports Medicine

## 2016-03-07 DIAGNOSIS — M79671 Pain in right foot: Secondary | ICD-10-CM

## 2016-03-07 MED ORDER — VITAMIN B-6 50 MG PO TABS: 50.0000 mg | ORAL_TABLET | Freq: Every day | ORAL | 2 refills | Status: DC

## 2016-03-07 MED ORDER — NORTRIPTYLINE HCL 25 MG PO CAPS: 25.0000 mg | ORAL_CAPSULE | Freq: Every day | ORAL | 2 refills | Status: DC

## 2016-03-07 NOTE — Progress Notes (Signed)
  Nicole HughesJulia G Oneill - [age] y.o. female MRN [id number]  Date of birth: [birth year]  SUBJECTIVE:  Including CC & ROS.  Chief Complaint  Patient presents with  . Foot Pain     Ms Nicole Oneill is a 55 year old female that is presenting with right foot pain. This is acute on chronic pain. She has been dealing with this pain for greater than a year. She notices the pain on the medial aspect of her right foot but also has pain in between her first and second digit on her right foot. It occurs medial to the first MTP joint. She has worn orthotics in her tennis shoes but cannot put them in other shoes. She has pain at night and her ankle that causes her some trouble sleeping. She has tried Elavil in the past which caused her to gain weight. She was placed on gabapentin recently but has stopped it on her own. She has significant pain when she wears normal shoes without orthotics. She is having pain while she is driving. She also has pain with movement of her ankle. She denies any prior injury or surgery to her right foot. She has trouble sleeping secondary to the pain.   ROS: No unexpected weight loss, fever, chills, swelling, instability, redness, otherwise see HPI    HISTORY: Past Medical, Surgical, Social, and Family History Reviewed & Updated per EMR.   Pertinent Historical Findings include: PMSHx -  Ehlers-Danlos type III, Fusion of T1-S, cervical vertebrae effusion, right partial knee replacement PSHx -  No tobacco. Occasional EtOH   DATA REVIEWED: None to review  PHYSICAL EXAM:  VS: BP:129/63  HR: bpm  TEMP: ( )  RESP:   HT:5' 5.5" (166.4 cm)   WT:128 lb (58.1 kg)  BMI:21 PHYSICAL EXAM: Gen: NAD, alert, cooperative with exam, well-appearing HEENT: clear conjunctiva, EOMI CV:  no edema, capillary refill brisk,  Resp: non-labored, normal speech Skin: no rashes, normal turgor  Neuro: no gross deficits.  Psych:  alert and oriented Foot:  Normal to inspection with no overlying erythema, ecchymosis, or  swelling. Normal range of motion of the great toe on the right. No tenderness to palpation over the metatarsals, lateral or medial malleolus. Normal range of motion of the ankle. Some tenderness to palpation at the medial aspect of the first MTP on the right. Some tennis palpation of the posterior tib proximal to the medial malleolus. No tenderness to palpation of the insertion of the posterior tibialis or through the tarsal tunnel Negative anterior drawer. Negative talar tilt. Negative compression test.   ASSESSMENT & PLAN:   Right foot pain Possible that this pain is most likely coming from her back. It is occurring in the L5 distribution. Her foot otherwise looks normal with nothing on exam that is suggestive of a certain pathology. This may be complications from her several fusions of her back as well as her Ehlers-Danlos - Initiated nortriptyline 25 mg daily. Also initiated vitamin B 6 5200 mg daily. - Placed a scaphoid pad in the right orthotic. - Stopped her Zoloft. - Advised to follow-up in 6 weeks.

## 2016-03-08 DIAGNOSIS — M5416 Radiculopathy, lumbar region: Secondary | ICD-10-CM | POA: Insufficient documentation

## 2016-03-08 NOTE — Assessment & Plan Note (Addendum)
Possible that this pain is most likely coming from her back. It is occurring in the L5 distribution. Her foot otherwise looks normal with nothing on exam that is suggestive of a certain pathology. This may be complications from her several fusions of her back as well as her Ehlers-Danlos - Initiated nortriptyline 25 mg daily. Also initiated vitamin B 6 5200 mg daily. - Placed a scaphoid pad in the right orthotic. - Stopped her Zoloft. - Advised to follow-up in 6 weeks.

## 2016-04-06 ENCOUNTER — Other Ambulatory Visit: Payer: Self-pay | Admitting: *Deleted

## 2016-04-06 MED ORDER — GABAPENTIN 300 MG PO CAPS: 300.0000 mg | ORAL_CAPSULE | Freq: Three times a day (TID) | ORAL | 2 refills | Status: DC

## 2016-04-12 ENCOUNTER — Ambulatory Visit: Payer: BLUE CROSS/BLUE SHIELD | Admitting: Sports Medicine

## 2016-04-16 DIAGNOSIS — Z79899 Other long term (current) drug therapy: Secondary | ICD-10-CM | POA: Diagnosis not present

## 2016-04-16 DIAGNOSIS — R5381 Other malaise: Secondary | ICD-10-CM | POA: Diagnosis not present

## 2016-04-16 DIAGNOSIS — E559 Vitamin D deficiency, unspecified: Secondary | ICD-10-CM | POA: Diagnosis not present

## 2016-04-17 DIAGNOSIS — Z23 Encounter for immunization: Secondary | ICD-10-CM | POA: Diagnosis not present

## 2016-04-19 ENCOUNTER — Ambulatory Visit: Payer: BLUE CROSS/BLUE SHIELD | Admitting: Sports Medicine

## 2016-04-24 ENCOUNTER — Ambulatory Visit (INDEPENDENT_AMBULATORY_CARE_PROVIDER_SITE_OTHER): Payer: BLUE CROSS/BLUE SHIELD | Admitting: Rheumatology

## 2016-04-24 DIAGNOSIS — M545 Low back pain: Secondary | ICD-10-CM | POA: Diagnosis not present

## 2016-04-24 DIAGNOSIS — M5030 Other cervical disc degeneration, unspecified cervical region: Secondary | ICD-10-CM

## 2016-04-24 DIAGNOSIS — M5137 Other intervertebral disc degeneration, lumbosacral region: Secondary | ICD-10-CM | POA: Diagnosis not present

## 2016-04-24 DIAGNOSIS — M170 Bilateral primary osteoarthritis of knee: Secondary | ICD-10-CM | POA: Diagnosis not present

## 2016-05-01 ENCOUNTER — Ambulatory Visit (INDEPENDENT_AMBULATORY_CARE_PROVIDER_SITE_OTHER): Payer: BLUE CROSS/BLUE SHIELD | Admitting: Sports Medicine

## 2016-05-01 ENCOUNTER — Encounter: Payer: Self-pay | Admitting: Sports Medicine

## 2016-05-01 DIAGNOSIS — M79671 Pain in right foot: Secondary | ICD-10-CM | POA: Diagnosis not present
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
## 2016-05-02 NOTE — Progress Notes (Signed)
Chief complaint; right foot pain and low back issues  The last visit patient was started on nortriptyline She seemed to have radicular symptoms in an L5 distribution The nortriptyline helped this significantly However she feels that she's gained 10 pounds on the nortriptyline During the day she feels it makes her too sedated  Past Hx Multiple levels of fusion in back Sxs that radiate into rt leg and rt foot  ROS Bilat hand pain (CMC) but not severe at this time Knee pain less severe  PE NAD BP 120/74   Pulse 72   Ht 5\' 5"  (1.651 m)   Wt 128 lb (58.1 kg)   BMI 21.30 kg/m   No change in foot exam No swelling No point tenderness  Back motion limited Some pain to Rt leg and foot w extension

## 2016-05-08 NOTE — Assessment & Plan Note (Signed)
This seems referred from back Challenge is to find what we can use that works but does not sedate  We will try with exercises and stretches first

## 2016-05-25 ENCOUNTER — Telehealth: Payer: Self-pay | Admitting: Rheumatology

## 2016-05-25 ENCOUNTER — Other Ambulatory Visit: Payer: Self-pay | Admitting: Rheumatology

## 2016-05-25 MED ORDER — DULOXETINE HCL 30 MG PO CPEP: 30.0000 mg | ORAL_CAPSULE | Freq: Every day | ORAL | 5 refills | Status: DC

## 2016-05-25 NOTE — Telephone Encounter (Signed)
Patient left message requesting Cymbalta 60 mg be sent to [facility] on [address].

## 2016-05-25 NOTE — Telephone Encounter (Signed)
Cymbalta called in for 30 mg daily thirty-day supply with 5 refills

## 2016-05-25 NOTE — Telephone Encounter (Signed)
Last Visit: 04/24/16 Next Visit Due April 2018  Labs: 04/17/16 WNL   Okay to refill Cymbalta?

## 2016-05-29 ENCOUNTER — Encounter: Payer: Self-pay | Admitting: Sports Medicine

## 2016-05-29 ENCOUNTER — Ambulatory Visit: Payer: Self-pay

## 2016-05-29 ENCOUNTER — Other Ambulatory Visit: Payer: Self-pay | Admitting: Rheumatology

## 2016-05-29 ENCOUNTER — Ambulatory Visit (INDEPENDENT_AMBULATORY_CARE_PROVIDER_SITE_OTHER): Payer: BLUE CROSS/BLUE SHIELD | Admitting: Sports Medicine

## 2016-05-29 VITALS — BP 128/73 | Ht 65.0 in | Wt 128.0 lb

## 2016-05-29 DIAGNOSIS — M5416 Radiculopathy, lumbar region: Secondary | ICD-10-CM | POA: Diagnosis not present

## 2016-05-29 DIAGNOSIS — M6798 Unspecified disorder of synovium and tendon, other site: Secondary | ICD-10-CM | POA: Diagnosis not present

## 2016-05-29 DIAGNOSIS — M25551 Pain in right hip: Secondary | ICD-10-CM

## 2016-05-29 DIAGNOSIS — M67951 Unspecified disorder of synovium and tendon, right thigh: Secondary | ICD-10-CM | POA: Insufficient documentation

## 2016-05-29 MED ORDER — TRAMADOL HCL 50 MG PO TABS: 50.0000 mg | ORAL_TABLET | Freq: Two times a day (BID) | ORAL | 1 refills | Status: DC

## 2016-05-29 MED ORDER — DULOXETINE HCL 60 MG PO CPEP: 60.0000 mg | ORAL_CAPSULE | Freq: Every day | ORAL | 3 refills | Status: DC

## 2016-05-29 NOTE — Progress Notes (Signed)
CC; RT hip pain 2.  Upper thoracic pain  Patient with known EDS She has had spinal fusion c4 -7 Also fusion from T2 to S1  She has pain into RT  hip laterally and posteriorly This pain can radiate all the way to Great toe  Pain in upper thoracic region Worse after flying or driving  We have been trying different medicines Relief with nortriptyline but side effects Has upped Cymbalta to 60 but not much change  Mobic helps Robaxin - unclear Ambein helps w sleep  ROS Wakes at 4 AM with pain in hip or upper back Pain in hands Pain in knees  PEXAM Looks tired and uncomfortable, NAD BP 128/73   Ht 5\' 5"  (1.651 m)   Wt 128 lb (58.1 kg)   BMI 21.30 kg/m   Scar from neck to lumbar region Loss of normal spinal curvature No spasm noted  Hips with normal ROM RT hip with weak abduction TTP over Glut Med and over piriformis  Ultrasound of RT hip lateral  GLuteus medius normal at attachment to iliac crest 6 cm distal shows hypoechoic change No frank tear Attachment at Greater trochanter is unremarkable Piriformis shows hypoechoic change 6 to 8 cm proximal to greater trochanter No frank tear  Summary:  Probably tendinopathy of gluteus medius and piriformis of RT hip  Ultrasound and interpretation by Enid BaasKarl Timon Geissinger, MD

## 2016-05-29 NOTE — Assessment & Plan Note (Signed)
Refer for PT  If we can improve strength of Glut med and piriformis maybe she can lessen pain

## 2016-05-29 NOTE — Telephone Encounter (Signed)
Patient's Cymbalta was increased to 60mg  and 30mg  was called in.

## 2016-05-29 NOTE — Telephone Encounter (Signed)
Patient states dose should be 60mg  per day, you called in 30mg , my chart review shows last visit 04/24/16 Cymbalta 30mg  was called in, Dr D did state  Cymbalta 30 mg at bedtime and if she tolerates it well we may be able to increase the dose.  Patient does want to proceed with the increased dose, ok to increase? I have pended the 60 mg dose. Please advise

## 2016-06-04 ENCOUNTER — Ambulatory Visit: Payer: BLUE CROSS/BLUE SHIELD

## 2016-06-11 ENCOUNTER — Ambulatory Visit: Payer: BLUE CROSS/BLUE SHIELD | Admitting: Physical Therapy

## 2016-06-11 DIAGNOSIS — R0602 Shortness of breath: Secondary | ICD-10-CM | POA: Diagnosis not present

## 2016-06-11 DIAGNOSIS — J029 Acute pharyngitis, unspecified: Secondary | ICD-10-CM | POA: Diagnosis not present

## 2016-06-11 DIAGNOSIS — B9689 Other specified bacterial agents as the cause of diseases classified elsewhere: Secondary | ICD-10-CM | POA: Diagnosis not present

## 2016-06-11 DIAGNOSIS — J019 Acute sinusitis, unspecified: Secondary | ICD-10-CM | POA: Diagnosis not present

## 2016-06-11 DIAGNOSIS — R52 Pain, unspecified: Secondary | ICD-10-CM | POA: Diagnosis not present

## 2016-06-17 DIAGNOSIS — R0602 Shortness of breath: Secondary | ICD-10-CM | POA: Diagnosis not present

## 2016-06-17 DIAGNOSIS — B9689 Other specified bacterial agents as the cause of diseases classified elsewhere: Secondary | ICD-10-CM | POA: Diagnosis not present

## 2016-06-17 DIAGNOSIS — J22 Unspecified acute lower respiratory infection: Secondary | ICD-10-CM | POA: Diagnosis not present

## 2016-06-17 DIAGNOSIS — J019 Acute sinusitis, unspecified: Secondary | ICD-10-CM | POA: Diagnosis not present

## 2016-06-21 DIAGNOSIS — J019 Acute sinusitis, unspecified: Secondary | ICD-10-CM | POA: Diagnosis not present

## 2016-06-21 DIAGNOSIS — B9689 Other specified bacterial agents as the cause of diseases classified elsewhere: Secondary | ICD-10-CM | POA: Diagnosis not present

## 2016-06-21 DIAGNOSIS — J22 Unspecified acute lower respiratory infection: Secondary | ICD-10-CM | POA: Diagnosis not present

## 2016-07-11 ENCOUNTER — Ambulatory Visit: Payer: BLUE CROSS/BLUE SHIELD | Attending: Sports Medicine

## 2016-07-11 DIAGNOSIS — M25551 Pain in right hip: Secondary | ICD-10-CM | POA: Diagnosis not present

## 2016-07-11 DIAGNOSIS — R252 Cramp and spasm: Secondary | ICD-10-CM | POA: Diagnosis not present

## 2016-07-11 DIAGNOSIS — R293 Abnormal posture: Secondary | ICD-10-CM | POA: Insufficient documentation

## 2016-07-11 DIAGNOSIS — M6281 Muscle weakness (generalized): Secondary | ICD-10-CM | POA: Insufficient documentation
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
## 2016-07-11 NOTE — Patient Instructions (Signed)
From cabinet sidelye clams RT and LT and standing abduction RT and LT   Both 2x/day 10-20 reps as tolerated

## 2016-07-23 ENCOUNTER — Ambulatory Visit: Payer: BLUE CROSS/BLUE SHIELD

## 2016-07-23 DIAGNOSIS — R252 Cramp and spasm: Secondary | ICD-10-CM | POA: Diagnosis not present

## 2016-07-23 DIAGNOSIS — R293 Abnormal posture: Secondary | ICD-10-CM

## 2016-07-23 DIAGNOSIS — M6281 Muscle weakness (generalized): Secondary | ICD-10-CM | POA: Diagnosis not present

## 2016-07-23 DIAGNOSIS — M25551 Pain in right hip: Secondary | ICD-10-CM | POA: Diagnosis not present

## 2016-07-23 NOTE — Patient Instructions (Signed)
Wall Sit    Back against wall, slide down so knees are at 90 angle. Hold _1-5___ seconds. Do _1-2___ sets. Complete _10-20___ repetitions.  http://st.exer.us/295   Copyright  VHI. All rights reserved.  Adduction: Hip - Knees Together (Hook-Lying)    Lie with hips and knees bent, towel roll between knees. Push knees together. Hold for _1-5__ seconds. Rest for _1-3__ seconds. Repeat _10-20__ times. Do _1-2__ times a day.   Copyright  VHI. All rights reserved.  Bridge    Lie back, legs bent. Inhale, pressing hips up. Keeping ribs in, lengthen lower back. Exhale, rolling down along spine from top. Repeat _10-20___ times. Do 1-2____ sessions per day. Do per Pilates shoulder bridge handout  http://pm.exer.us/55   Copyright  VHI. All rights reserved.  PELVIC TILT: Posterior    Tighten abdominals, flatten low back. 10-20___ reps per set, __1-2_ sets per day, 5-7___ days per week   Copyright  VHI. All rights reserved.

## 2016-07-23 NOTE — Therapy (Signed)
[facility] [address] [place], [place], [address] Phone: [phone]   Fax:  [phone]  Physical Therapy Treatment  Patient Details  Name: Nicole Oneill MRN: [id number] Date of Birth: [birth year] Referring Provider: Enid Baas, MD  Encounter Date: 07/23/2016      PT End of Session - 07/23/16 0854    Visit Number 2   Number of Visits 12   Date for PT Re-Evaluation 08/17/16   Authorization Type BCBS   PT Start Time [id number]   PT Stop Time 0930   PT Time Calculation (min) 44 min   Activity Tolerance Patient tolerated treatment well   Behavior During Therapy [facility] for tasks assessed/performed      Past Medical History:  Diagnosis Date  . Anemia   . Arthritis    osteo  . Complication of anesthesia    urinary retention requiring indwelling  foley catheterization  . DDD (degenerative disc disease)   . Ehlers-Danlos syndrome type III   . Fibromyalgia   . History of blood transfusion   . Osteoarthritis     Past Surgical History:  Procedure Laterality Date  . BACK SURGERY     Cervical fusion C5-7, fusion  T10-S1  . DIAGNOSTIC LAPAROSCOPY     x 3  . EYE SURGERY     bilateral tear duct fusion  . JOINT REPLACEMENT     partial left knee 2005, total left knee 2012  . KNEE ARTHROSCOPY  08/06/2012   Procedure: ARTHROSCOPY KNEE;  Surgeon: Loanne Drilling, MD;  Location: [facility];  Service: Orthopedics;  Laterality: Left;  WITH SYNOVECTOMY and debridement  . OSTEOTOMY     bilateral tibial    There were no vitals filed for this visit.      Subjective Assessment - 07/23/16 0853    Subjective No new problems . Feel pretty good today   Currently in Pain? Yes  None in hip   Pain Score 1    Pain Location Foot   Pain Orientation Right                         [facility] Adult PT Treatment/Exercise - 07/23/16 0001      Exercises   Exercises Knee/Hip     Knee/Hip Exercises: Aerobic   Recumbent Bike L3 7 min      Knee/Hip Exercises: Standing   Wall Squat 5 seconds;15 reps     Knee/Hip Exercises: Supine   Hip Adduction Isometric Both;15 reps   Hip Adduction Isometric Limitations with gluteal squeeze   Bridges 20 reps   Bridges Limitations cued with demo and verbal for technique   Other Supine Knee/Hip Exercises bent leg loift with pelci tilt, ab set x 8 RT and LT cued for technique.      Knee/Hip Exercises: Sidelying   Clams x30 with variations including red band (issued ) , pelvic lateral tilt and feet in air all without incr pain                PT Education - 07/23/16 0929    Education provided Yes   Education Details added bridge and advance clam and pelvic tilt and hip adduction, wall sit   Person(s) Educated Patient   Methods Explanation;Demonstration;Tactile cues;Verbal cues;Handout   Comprehension Verbalized understanding;Returned demonstration          PT Short Term Goals - 07/11/16 1507      PT SHORT TERM GOAL #1   Title She  will be independent with inital HEP   Time 3   Period Weeks   Status New     PT SHORT TERM GOAL #2   Title she will report pain 25% decr RT hip  with walking   Time 3   Period Weeks   Status New     PT SHORT TERM GOAL #3   Title she demo 3+/5 RT hip abduction   Time 3   Period Weeks   Status New           PT Long Term Goals - 07/11/16 1508      PT LONG TERM GOAL #1   Title She will be independent with all HEP issued   Time 6   Period Weeks   Status New     PT LONG TERM GOAL #2   Title She will report pain RT hip decreased 75% with sitting and walking   Time 6   Period Weeks   Status New     PT LONG TERM GOAL #3   Title She will improve strength to 4/5 minimally RT hip all groups.    Time 6   Period Weeks   Status New     PT LONG TERM GOAL #4   Title she will report driving with 69%50% less pain in RT hip   Time 6   Period Weeks   Status New               Plan - 07/23/16 [id number]    Clinical Impression  Statement no changes reported but doing well today. Continue strength and modalities as needed   PT Treatment/Interventions Cryotherapy;Ultrasound;Dry needling;Taping;Manual techniques;Passive range of motion;Patient/family education;Therapeutic exercise;Moist Heat;Iontophoresis 4mg /ml Dexamethasone   PT Next Visit Plan HEP strength hip , modalities as needed, manual   PT Home Exercise Plan clams amd stand hip abduction, shoulder bridge , hip adduction , PPT, wall sit   Consulted and Agree with Plan of Care Patient      Patient will benefit from skilled therapeutic intervention in order to improve the following deficits and impairments:  Pain, Decreased strength, Increased muscle spasms, Decreased range of motion, Decreased activity tolerance, Postural dysfunction  Visit Diagnosis: Pain in right hip  Cramp and spasm  Muscle weakness (generalized)  Abnormal posture     Problem List Patient Active Problem List   Diagnosis Date Noted  . Tendinopathy of right gluteus medius 05/29/2016  . Chronic lumbar radiculopathy 03/08/2016  . Heart murmur, systolic 06/19/2014  . Disorder of SI (sacroiliac) joint 06/02/2014  . Functional gait abnormality 06/02/2014  . Ehlers-Danlos syndrome type III 03/10/2014  . Foot arch pain 03/10/2014  . Labral tear of shoulder 05/26/2013  . Left knee pain 05/26/2013  . 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 Synovitis of knee 08/05/2012  . Plantar fasciitis 02/05/2011  . Lateral epicondylitis of right elbow 10/12/2010  . Osteoarthritis of multiple joints 10/12/2010    Nicole Oneill, Nicole Oneill  PT 07/23/2016, 9:37 AM  [facility] [address] [place], [place], [address] Phone: [phone]   Fax:  [phone]  Name: Nicole HughesJulia G Oneill MRN: [id number] Date of Birth: Jul 29, 1960

## 2016-07-25 ENCOUNTER — Ambulatory Visit: Payer: BLUE CROSS/BLUE SHIELD | Admitting: Physical Therapy

## 2016-07-30 ENCOUNTER — Ambulatory Visit: Payer: BLUE CROSS/BLUE SHIELD

## 2016-08-01 ENCOUNTER — Ambulatory Visit: Payer: BLUE CROSS/BLUE SHIELD | Admitting: Physical Therapy

## 2016-08-02 ENCOUNTER — Ambulatory Visit: Payer: BLUE CROSS/BLUE SHIELD

## 2016-08-02 DIAGNOSIS — R252 Cramp and spasm: Secondary | ICD-10-CM | POA: Diagnosis not present

## 2016-08-02 DIAGNOSIS — M6281 Muscle weakness (generalized): Secondary | ICD-10-CM

## 2016-08-02 DIAGNOSIS — R293 Abnormal posture: Secondary | ICD-10-CM

## 2016-08-02 DIAGNOSIS — M25551 Pain in right hip: Secondary | ICD-10-CM

## 2016-08-02 NOTE — Patient Instructions (Signed)
Demo and pt returned demo of stepping forward and back with delayed weight shift  To max time on single leg2-5x/day 2-5 reps RT /LT

## 2016-08-02 NOTE — Therapy (Signed)
[facility] [place], [place], [address] Phone: [phone]   Fax:  [phone]  Physical Therapy Treatment  Patient Details  Name: Nicole Oneill MRN: [id number] Date of Birth: [birth year] Referring Provider: Stefanie Libel, MD  Encounter Date: 08/02/2016      PT End of Session - 08/02/16 0934    Visit Number 3   Number of Visits 12   Date for PT Re-Evaluation 08/17/16   Authorization Type BCBS   PT Start Time 0930   PT Stop Time 1018   PT Time Calculation (min) 48 min   Activity Tolerance Patient tolerated treatment well   Behavior During Therapy [facility] for tasks assessed/performed      Past Medical History:  Diagnosis Date  . Anemia   . Arthritis    osteo  . Complication of anesthesia    urinary retention requiring indwelling  foley catheterization  . DDD (degenerative disc disease)   . Ehlers-Danlos syndrome type III   . Fibromyalgia   . History of blood transfusion   . Osteoarthritis     Past Surgical History:  Procedure Laterality Date  . BACK SURGERY     Cervical fusion C5-7, fusion  T10-S1  . DIAGNOSTIC LAPAROSCOPY     x 3  . EYE SURGERY     bilateral tear duct fusion  . JOINT REPLACEMENT     partial left knee 2005, total left knee 2012  . KNEE ARTHROSCOPY  08/06/2012   Procedure: ARTHROSCOPY KNEE;  Surgeon: Gearlean Alf, MD;  Location: [facility];  Service: Orthopedics;  Laterality: Left;  WITH SYNOVECTOMY and debridement  . OSTEOTOMY     bilateral tibial    There were no vitals filed for this visit.      Subjective Assessment - 08/02/16 0934    Subjective No pain now   Currently in Pain? No/denies                         [facility] Adult PT Treatment/Exercise - 08/02/16 0001      Knee/Hip Exercises: Stretches   Piriformis Stretch Right;Left;30 seconds   Other Knee/Hip Stretches bilateral knee to chest x 30 then single leg with adduciton x 30 sec RT/LT     Knee/Hip  Exercises: Aerobic   Recumbent Bike L4 6 min LE only     Knee/Hip Exercises: Standing   Other Standing Knee Exercises Tai chi based stepping RT /LT and forward /back cued for technique. Cued to do as many or few as comfortable     Knee/Hip Exercises: Supine   Hip Adduction Isometric Both;15 reps   Hip Adduction Isometric Limitations with gluteal squeeze with shoulder bridge   Other Supine Knee/Hip Exercises bent leg lift with pelvic tilt, ab set x 8 RT and LT cued for technique.      Knee/Hip Exercises: Sidelying   Clams x30 with variations including red band (issued ) , pelvic lateral tilt and feet in air all without incr pain     Manual Therapy   Manual Therapy Manual Traction     Reformer with yellow spring leg press bilateral and single leg x 12-15 reps , PF x 20 bilateral , cued for leg alignment.             PT Education - 08/02/16 1129    Education provided Yes   Education Details controlled stepping and weight shift   Person(s) Educated Patient   Methods Explanation;Demonstration;Verbal cues  Comprehension Returned demonstration;Verbalized understanding          PT Short Term Goals - 08/02/16 1130      PT SHORT TERM GOAL #1   Title She will be independent with inital HEP   Status Achieved     PT SHORT TERM GOAL #2   Title she will report pain 25% decr RT hip  with walking   Status On-going     PT SHORT TERM GOAL #3   Title she demo 3+/5 RT hip abduction   Baseline not tested   Status On-going           PT Long Term Goals - 07/11/16 1508      PT LONG TERM GOAL #1   Title She will be independent with all HEP issued   Time 6   Period Weeks   Status New     PT LONG TERM GOAL #2   Title She will report pain RT hip decreased 75% with sitting and walking   Time 6   Period Weeks   Status New     PT LONG TERM GOAL #3   Title She will improve strength to 4/5 minimally RT hip all groups.    Time 6   Period Weeks   Status New     PT LONG TERM  GOAL #4   Title she will report driving with 63% less pain in RT hip   Time 6   Period Weeks   Status New               Plan - 08/02/16 1129    Clinical Impression Statement No pain today and she was able to do all exercise with some mild discomfort that appeared to resolve after short rest. No complaints at end   PT Treatment/Interventions Cryotherapy;Ultrasound;Dry needling;Taping;Manual techniques;Passive range of motion;Patient/family education;Therapeutic exercise;Moist Heat;Iontophoresis '4mg'$ /ml Dexamethasone   PT Next Visit Plan HEP strength hip , modalities as needed, manual   PT Home Exercise Plan clams amd stand hip abduction, shoulder bridge , hip adduction , PPT, wall sit, controlled stepping for balance and strength   Consulted and Agree with Plan of Care Patient      Patient will benefit from skilled therapeutic intervention in order to improve the following deficits and impairments:  Pain, Decreased strength, Increased muscle spasms, Decreased range of motion, Decreased activity tolerance, Postural dysfunction  Visit Diagnosis: Pain in right hip  Cramp and spasm  Muscle weakness (generalized)  Abnormal posture     Problem List Patient Active Problem List   Diagnosis Date Noted  . Tendinopathy of right gluteus medius 05/29/2016  . Chronic lumbar radiculopathy 03/08/2016  . Heart murmur, systolic 87/56/4332  . Disorder of SI (sacroiliac) joint 06/02/2014  . Functional gait abnormality 06/02/2014  . Ehlers-Danlos syndrome type III 03/10/2014  . Foot arch pain 03/10/2014  . Labral tear of shoulder 05/26/2013  . Left knee pain 05/26/2013  . [place] arthritis 05/26/2013  . Synovitis of knee 08/05/2012  . Plantar fasciitis 02/05/2011  . Lateral epicondylitis of right elbow 10/12/2010  . Osteoarthritis of multiple joints 10/12/2010    Darrel Hoover  PT 08/02/2016, 11:32 AM  [facility] [address] [place], [place], [address] Phone: [phone]   Fax:  [phone]  Name: Nicole Oneill MRN: [id number] Date of Birth: 1961/04/20

## 2016-08-06 ENCOUNTER — Ambulatory Visit: Payer: BLUE CROSS/BLUE SHIELD

## 2016-08-06 DIAGNOSIS — R293 Abnormal posture: Secondary | ICD-10-CM | POA: Diagnosis not present

## 2016-08-06 DIAGNOSIS — R252 Cramp and spasm: Secondary | ICD-10-CM

## 2016-08-06 DIAGNOSIS — M6281 Muscle weakness (generalized): Secondary | ICD-10-CM | POA: Diagnosis not present

## 2016-08-06 DIAGNOSIS — M25551 Pain in right hip: Secondary | ICD-10-CM | POA: Diagnosis not present

## 2016-08-06 NOTE — Therapy (Signed)
[facility] [address] [place], [place], [address] Phone: [phone]   Fax:  [phone]  Physical Therapy Treatment  Patient Details  Name: Nicole Oneill MRN: [id number] Date of Birth: [birth year] Referring Provider: Enid Baas, MD  Encounter Date: 08/06/2016      PT End of Session - 08/06/16 0854    Visit Number 4   Number of Visits 12   Date for PT Re-Evaluation 08/17/16   Authorization Type BCBS   PT Start Time 0850   PT Stop Time 0930   PT Time Calculation (min) 40 min   Activity Tolerance Patient tolerated treatment well   Behavior During Therapy Nicole Oneill for tasks assessed/performed      Past Medical History:  Diagnosis Date  . Anemia   . Arthritis    osteo  . Complication of anesthesia    urinary retention requiring indwelling  foley catheterization  . DDD (degenerative disc disease)   . Ehlers-Danlos syndrome type III   . Fibromyalgia   . History of blood transfusion   . Osteoarthritis     Past Surgical History:  Procedure Laterality Date  . BACK SURGERY     Cervical fusion C5-7, fusion  T10-S1  . DIAGNOSTIC LAPAROSCOPY     x 3  . EYE SURGERY     bilateral tear duct fusion  . JOINT REPLACEMENT     partial left knee 2005, total left knee 2012  . KNEE ARTHROSCOPY  08/06/2012   Procedure: ARTHROSCOPY KNEE;  Surgeon: Loanne Drilling, MD;  Location: [facility];  Service: Orthopedics;  Laterality: Left;  WITH SYNOVECTOMY and debridement  . OSTEOTOMY     bilateral tibial    There were no vitals filed for this visit.      Subjective Assessment - 08/06/16 0853    Subjective No pain today , did not do much on weekend.   Currently in Pain? No/denies                         [facility] Adult PT Treatment/Exercise - 08/06/16 0001      Knee/Hip Exercises: Aerobic   Nustep L5 LE only 5 min     Knee/Hip Exercises: Standing   Wall Squat 5 seconds;15 reps   Wall Squat Limitations with ball squeeze    Other Standing Knee Exercises side steps RT/Lt  with green band  at ankles.      Knee/Hip Exercises: Supine   Bridges 20 reps   Bridges Limitations with ball squeeze.    Other Supine Knee/Hip Exercises bent leg lift with pelvic tilt, ab set x 10 RT and LT cued for technique.      Knee/Hip Exercises: Sidelying   Clams 3x10  with feet in air RT leg     Knee/Hip Exercises: Prone   Straight Leg Raises Right;Left  12 reps  2 sets   Straight Leg Raises Limitations over 2 pillows     Manual Therapy   Manual Therapy Muscle Energy Technique   Muscle Energy Technique contract relax RT hip flexion with push into extension x 3 10 sec then pt did independently the same.  Pelvis with R T ASIS more level with LT                 PT Education - 08/06/16 0935    Education provided Yes   Education Details minisquats , side steps , knee to chest   Person(s) Educated Patient   Methods  Explanation;Tactile cues;Verbal cues;Handout;Demonstration   Comprehension Returned demonstration;Verbalized understanding          PT Short Term Goals - 08/02/16 1130      PT SHORT TERM GOAL #1   Title She will be independent with inital HEP   Status Achieved     PT SHORT TERM GOAL #2   Title she will report pain 25% decr RT hip  with walking   Status On-going     PT SHORT TERM GOAL #3   Title she demo 3+/5 RT hip abduction   Baseline not tested   Status On-going           PT Long Term Goals - 07/11/16 1508      PT LONG TERM GOAL #1   Title She will be independent with all HEP issued   Time 6   Period Weeks   Status New     PT LONG TERM GOAL #2   Title She will report pain RT hip decreased 75% with sitting and walking   Time 6   Period Weeks   Status New     PT LONG TERM GOAL #3   Title She will improve strength to 4/5 minimally RT hip all groups.    Time 6   Period Weeks   Status New     PT LONG TERM GOAL #4   Title she will report driving with 45%50% less pain in RT hip    Time 6   Period Weeks   Status New               Plan - 08/06/16 [id number]    Clinical Impression Statement No pain today  and agreed she worked legs today with no pain. Continue to push strength and stabilization   PT Treatment/Interventions Cryotherapy;Ultrasound;Dry needling;Taping;Manual techniques;Passive range of motion;Patient/family education;Therapeutic exercise;Moist Heat;Iontophoresis 4mg /ml Dexamethasone   PT Next Visit Plan HEP strength hip , modalities as needed, manual   PT Home Exercise Plan clams amd stand hip abduction, shoulder bridge , hip adduction , PPT, wall sit, controlled stepping for balance and strength, mini squats with opp knee lift, sidesteps with band, knee to chest RT.    Consulted and Agree with Plan of Care Patient      Patient will benefit from skilled therapeutic intervention in order to improve the following deficits and impairments:  Pain, Decreased strength, Increased muscle spasms, Decreased range of motion, Decreased activity tolerance, Postural dysfunction  Visit Diagnosis: Pain in right hip  Cramp and spasm  Muscle weakness (generalized)  Abnormal posture     Problem List Patient Active Problem List   Diagnosis Date Noted  . Tendinopathy of right gluteus medius 05/29/2016  . Chronic lumbar radiculopathy 03/08/2016  . Heart murmur, systolic 06/19/2014  . Disorder of SI (sacroiliac) joint 06/02/2014  . Functional gait abnormality 06/02/2014  . Ehlers-Danlos syndrome type III 03/10/2014  . Foot arch pain 03/10/2014  . Labral tear of shoulder 05/26/2013  . Left knee pain 05/26/2013  . 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 Synovitis of knee 08/05/2012  . Plantar fasciitis 02/05/2011  . Lateral epicondylitis of right elbow 10/12/2010  . Osteoarthritis of multiple joints 10/12/2010    Nicole Oneill, Nicole Oneill  PT 08/06/2016, 9:37 AM  [facility] [address] [place], [place], [address] Phone:  [phone]   Fax:  [phone]  Name: Nicole Oneill MRN: [id number] Date of Birth: 11/02/1960

## 2016-08-06 NOTE — Patient Instructions (Signed)
Issued written instructions and issued green band for side steps RT/Lt 5-15 reps  1-2x/day also mini squats with opposite knee releases lifting body and lowering body with lower of knee also contract relax with RT knee to chest 3 reps 10 sec 3-4 reps 1/2 sets 1/2x/day

## 2016-08-09 ENCOUNTER — Ambulatory Visit: Payer: BLUE CROSS/BLUE SHIELD | Attending: Sports Medicine | Admitting: Physical Therapy

## 2016-08-09 ENCOUNTER — Other Ambulatory Visit: Payer: Self-pay | Admitting: Rheumatology

## 2016-08-09 DIAGNOSIS — M25551 Pain in right hip: Secondary | ICD-10-CM | POA: Insufficient documentation

## 2016-08-09 DIAGNOSIS — R293 Abnormal posture: Secondary | ICD-10-CM | POA: Diagnosis not present

## 2016-08-09 DIAGNOSIS — R252 Cramp and spasm: Secondary | ICD-10-CM

## 2016-08-09 DIAGNOSIS — M6281 Muscle weakness (generalized): Secondary | ICD-10-CM | POA: Diagnosis not present

## 2016-08-09 NOTE — Therapy (Signed)
[facility] Outpatient Rehabilitation [facility] [address] [place], [place], [address] Phone: [phone]   Fax:  [phone]  Physical Therapy Treatment  Patient Details  Name: Nicole Oneill MRN: [id number] Date of Birth: 1960-09-29 Referring Provider: Enid Baas, MD  Encounter Date: 08/09/2016      PT End of Session - 08/09/16 0855    Visit Number 5   Number of Visits 12   Date for PT Re-Evaluation 08/17/16   Authorization Type BCBS   PT Start Time 0850   PT Stop Time 0945   PT Time Calculation (min) 55 min   Activity Tolerance Patient tolerated treatment well   Behavior During Therapy [facility] for tasks assessed/performed      Past Medical History:  Diagnosis Date  . Anemia   . Arthritis    osteo  . Complication of anesthesia    urinary retention requiring indwelling  foley catheterization  . DDD (degenerative disc disease)   . Ehlers-Danlos syndrome type III   . Fibromyalgia   . History of blood transfusion   . Osteoarthritis     Past Surgical History:  Procedure Laterality Date  . BACK SURGERY     Cervical fusion C5-7, fusion  T10-S1  . DIAGNOSTIC LAPAROSCOPY     x 3  . EYE SURGERY     bilateral tear duct fusion  . JOINT REPLACEMENT     partial left knee 2005, total left knee 2012  . KNEE ARTHROSCOPY  08/06/2012   Procedure: ARTHROSCOPY KNEE;  Surgeon: Loanne Drilling, MD;  Location: [facility];  Service: Orthopedics;  Laterality: Left;  WITH SYNOVECTOMY and debridement  . OSTEOTOMY     bilateral tibial    There were no vitals filed for this visit.      Subjective Assessment - 08/09/16 0924    Subjective I dont feel pain when I dont move.     Currently in Pain? Yes   Pain Score 3    Pain Location Hip   Pain Orientation Right   Pain Descriptors / Indicators Radiating   Pain Type Chronic pain   Pain Onset More than a month ago            [facility] PT Assessment - 08/09/16 0857      Posture/Postural Control   Posture/Postural Control  Postural limitations   Posture Comments Pt with Right elevated pelvi level                      [facility] Adult PT Treatment/Exercise - 08/09/16 0857      Self-Care   Self-Care Other Self-Care Comments   Other Self-Care Comments  education on trigger point dry needling rpecautians and aftercare     Knee/Hip Exercises: Stretches   Piriformis Stretch Right;Left;30 seconds     Knee/Hip Exercises: Standing   Wall Squat 5 seconds;15 reps   Wall Squat Limitations with ball squeeze   Other Standing Knee Exercises side steps RT/Lt  with green band  at ankles.   10 ft x 4     Knee/Hip Exercises: Supine   Bridges 20 reps   Bridges Limitations with ball squeeze.      Knee/Hip Exercises: Sidelying   Clams 3x10  with feet in air RT leg     Manual Therapy   Manual Therapy Soft tissue mobilization;Myofascial release   Soft tissue mobilization right gluteals and quadratus lumborum in left sidelying   Myofascial Release right Quadratus Lumborum  Trigger Point Dry Needling - 08/09/16 0856    Consent Given? Yes   Education Handout Provided Yes   Muscles Treated Upper Body Quadratus Lumborum;Gluteus minimus;Gluteus maximus;Piriformis  Right side only  QL twitch response   Muscles Treated Lower Body Gluteus minimus;Gluteus maximus;Piriformis   Gluteus Maximus Response Twitch response elicited;Palpable increased muscle length   Gluteus Minimus Response Twitch response elicited;Palpable increased muscle length   Piriformis Response Twitch response elicited;Palpable increased muscle length              PT Education - 08/09/16 0854    Education provided Yes   Education Details education on trigger point dry needling aftercare and precautians   Person(s) Educated Patient   Methods Explanation;Verbal cues;Handout   Comprehension Verbalized understanding;Returned demonstration          PT Short Term Goals - 08/02/16 1130      PT SHORT TERM GOAL #1   Title She  will be independent with inital HEP   Status Achieved     PT SHORT TERM GOAL #2   Title she will report pain 25% decr RT hip  with walking   Status On-going     PT SHORT TERM GOAL #3   Title she demo 3+/5 RT hip abduction   Baseline not tested   Status On-going           PT Long Term Goals - 07/11/16 1508      PT LONG TERM GOAL #1   Title She will be independent with all HEP issued   Time 6   Period Weeks   Status New     PT LONG TERM GOAL #2   Title She will report pain RT hip decreased 75% with sitting and walking   Time 6   Period Weeks   Status New     PT LONG TERM GOAL #3   Title She will improve strength to 4/5 minimally RT hip all groups.    Time 6   Period Weeks   Status New     PT LONG TERM GOAL #4   Title she will report driving with 16% less pain in RT hip   Time 6   Period Weeks   Status New               Plan - 08/09/16 1096    Clinical Impression Statement Pt with 3/10 pain in right hip today.  Also complains of Right upper trap pain but will address only Hip as per order.  Pt requested and consented to trigger point dry needling as she has had it in past with good result.  Pt was monitored closely throughpout session and had several localized twitch responses in gluteals and right quadratus lumborum. Will continue with HEP and strengthening .     Rehab Potential Good   PT Frequency 2x / week   PT Duration 6 weeks   PT Treatment/Interventions Cryotherapy;Ultrasound;Dry needling;Taping;Manual techniques;Passive range of motion;Patient/family education;Therapeutic exercise;Moist Heat;Iontophoresis 4mg /ml Dexamethasone   PT Next Visit Plan HEP strength hip , modalities as needed, manual assess TDN   PT Home Exercise Plan clams amd stand hip abduction, shoulder bridge , hip adduction , PPT, wall sit, controlled stepping for balance and strength, mini squats with opp knee lift, sidesteps with band, knee to chest RT.    Consulted and Agree with Plan  of Care Patient      Patient will benefit from skilled therapeutic intervention in order to improve the following deficits and impairments:  Pain, Decreased strength, Increased muscle spasms, Decreased range of motion, Decreased activity tolerance, Postural dysfunction  Visit Diagnosis: Pain in right hip  Cramp and spasm  Muscle weakness (generalized)  Abnormal posture     Problem List Patient Active Problem List   Diagnosis Date Noted  . Tendinopathy of right gluteus medius 05/29/2016  . Chronic lumbar radiculopathy 03/08/2016  . Heart murmur, systolic 06/19/2014  . Disorder of SI (sacroiliac) joint 06/02/2014  . Functional gait abnormality 06/02/2014  . Ehlers-Danlos syndrome type III 03/10/2014  . Foot arch pain 03/10/2014  . Labral tear of shoulder 05/26/2013  . Left knee pain 05/26/2013  . 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 Synovitis of knee 08/05/2012  . Plantar fasciitis 02/05/2011  . Lateral epicondylitis of right elbow 10/12/2010  . Osteoarthritis of multiple joints 10/12/2010   Garen LahLawrie Beardsley, PT Exercise Expert for the Aging Adult  08/09/16 10:37 AM Phone: [phone] Fax: [phone]  [facility] [address] [place], [place], [address] Phone: [phone]   Fax:  [phone]  Name: Nicole HughesJulia G Oneill MRN: [id number] Date of Birth: 1961-01-27

## 2016-08-09 NOTE — Telephone Encounter (Signed)
Last Visit: 04/24/16 Next Visit due April 2018. Message sent to the front to schedule patient Labs: 04/17/16 WNL  Okay to refill Methocarbomal?

## 2016-08-09 NOTE — Patient Instructions (Signed)
Trigger Point Dry Needling  . What is Trigger Point Dry Needling (DN)? o DN is a physical therapy technique used to treat muscle pain and dysfunction. Specifically, DN helps deactivate muscle trigger points (muscle knots).  o A thin filiform needle is used to penetrate the skin and stimulate the underlying trigger point. The goal is for a local twitch response (LTR) to occur and for the trigger point to relax. No medication of any kind is injected during the procedure.   . What Does Trigger Point Dry Needling Feel Like?  o The procedure feels different for each individual patient. Some patients report that they do not actually feel the needle enter the skin and overall the process is not painful. Very mild bleeding may occur. However, many patients feel a deep cramping in the muscle in which the needle was inserted. This is the local twitch response.   Marland Kitchen. How Will I feel after the treatment? o Soreness is normal, and the onset of soreness may not occur for a few hours. Typically this soreness does not last longer than two days.  o Bruising is uncommon, however; ice can be used to decrease any possible bruising.  o In rare cases feeling tired or nauseous after the treatment is normal. In addition, your symptoms may get worse before they get better, this period will typically not last longer than 24 hours.   . What Can I do After My Treatment? o Increase your hydration by drinking more water for the next 24 hours. o You may place ice or heat on the areas treated that have become sore, however, do not use heat on inflamed or bruised areas. Heat often brings more relief post needling. o You can continue your regular activities, but vigorous activity is not recommended initially after the treatment for 24 hours. o DN is best combined with other physical therapy such as strengthening, stretching, and other therapies.   Nicole Oneill, PT Exercise Expert for the Aging Adult  08/09/16 8:53 AM Phone:  [phone] Fax: [phone]

## 2016-08-13 ENCOUNTER — Ambulatory Visit: Payer: BLUE CROSS/BLUE SHIELD

## 2016-08-13 DIAGNOSIS — M6281 Muscle weakness (generalized): Secondary | ICD-10-CM

## 2016-08-13 DIAGNOSIS — M25551 Pain in right hip: Secondary | ICD-10-CM | POA: Diagnosis not present

## 2016-08-13 DIAGNOSIS — R293 Abnormal posture: Secondary | ICD-10-CM | POA: Diagnosis not present

## 2016-08-13 DIAGNOSIS — R252 Cramp and spasm: Secondary | ICD-10-CM | POA: Diagnosis not present

## 2016-08-13 NOTE — Therapy (Signed)
[facility] [address] [place], [place], [address] Phone: [phone]   Fax:  [phone]  Physical Therapy Treatment  Patient Details  Name: Nicole HughesJulia G Oneill MRN: [id number] Date of Birth: 1961/06/17 Referring Provider: Enid BaasKarl Fields, MD  Encounter Date: 08/13/2016      PT End of Session - 08/13/16 0925    Visit Number 6   Number of Visits 12   Date for PT Re-Evaluation 08/17/16   Authorization Type BCBS   PT Start Time 0845   PT Stop Time 0936   PT Time Calculation (min) 51 min   Activity Tolerance Patient tolerated treatment well   Behavior [facility] Therapy [facility] for tasks assessed/performed      Past Medical History:  Diagnosis Date  . Anemia   . Arthritis    osteo  . Complication of anesthesia    urinary retention requiring indwelling  foley catheterization  . DDD (degenerative disc disease)   . Ehlers-Danlos syndrome type III   . Fibromyalgia   . History of blood transfusion   . Osteoarthritis     Past Surgical History:  Procedure Laterality Date  . BACK SURGERY     Cervical fusion C5-7, fusion  T10-S1  . DIAGNOSTIC LAPAROSCOPY     x 3  . EYE SURGERY     bilateral tear duct fusion  . JOINT REPLACEMENT     partial left knee 2005, total left knee 2012  . KNEE ARTHROSCOPY  08/06/2012   Procedure: ARTHROSCOPY KNEE;  Surgeon: Loanne DrillingFrank V Aluisio, MD;  Location: [facility];  Service: Orthopedics;  Laterality: Left;  WITH SYNOVECTOMY and debridement  . OSTEOTOMY     bilateral tibial    There were no vitals filed for this visit.                       [facility] Adult PT Treatment/Exercise - 08/13/16 0001      Knee/Hip Exercises: Stretches   Piriformis Stretch Left;Right;60 seconds;1 rep     Knee/Hip Exercises: Aerobic   Nustep L5 LE only 6 min     Knee/Hip Exercises: Standing   Wall Squat 5 seconds;15 reps   Wall Squat Limitations with ball squeeze and small ball to back   Other Standing Knee Exercises  side steps RT/Lt  with green band  at ankles. x30 feet  each     Knee/Hip Exercises: Supine   Bridges --  30 reps with ball squeeze   Other Supine Knee/Hip Exercises bent leg lift with pelvic tilt, ab set x 15 RT and LT cued for technique.      Knee/Hip Exercises: Sidelying   Clams 3x10  with feet in air RT leg red band     Modalities   Modalities Moist Heat     Moist Heat Therapy   Number Minutes Moist Heat 12 Minutes   Moist Heat Location Hip  RT     Manual Therapy   Manual Therapy Manual Traction   Soft tissue mobilization right gluteals in left sidelying  with rock tool   Manual Traction RT leg  60 sec with osscilation                  PT Short Term Goals - 08/02/16 1130      PT SHORT TERM GOAL #1   Title She will be independent with inital HEP   Status Achieved     PT SHORT TERM GOAL #2   Title she will report  pain 25% decr RT hip  with walking   Status On-going     PT SHORT TERM GOAL #3   Title she demo 3+/5 RT hip abduction   Baseline not tested   Status On-going           PT Long Term Goals - 07/11/16 1508      PT LONG TERM GOAL #1   Title She will be independent with all HEP issued   Time 6   Period Weeks   Status New     PT LONG TERM GOAL #2   Title She will report pain RT hip decreased 75% with sitting and walking   Time 6   Period Weeks   Status New     PT LONG TERM GOAL #3   Title She will improve strength to 4/5 minimally RT hip all groups.    Time 6   Period Weeks   Status New     PT LONG TERM GOAL #4   Title she will report driving with 96% less pain in RT hip   Time 6   Period Weeks   Status New               Plan - 08/13/16 0454    Clinical Impression Statement No pain today but sore after exercise. She may be ready for discharge soon as she has a good program of exercise and may do well with a pilates program on reformer and we will try to schedul with Idalia Needle PT for this here before discharge   PT  Treatment/Interventions Cryotherapy;Ultrasound;Dry needling;Taping;Manual techniques;Passive range of motion;Patient/family education;Therapeutic exercise;Moist Heat;Iontophoresis 4mg /ml Dexamethasone   PT Next Visit Plan HEP strength hip , modalities as needed, manual assess TDN   PT Home Exercise Plan clams amd stand hip abduction, shoulder bridge , hip adduction , PPT, wall sit, controlled stepping for balance and strength, mini squats with opp knee lift, sidesteps with band, knee to chest RT.    Consulted and Agree with Plan of Care Patient      Patient will benefit from skilled therapeutic intervention in order to improve the following deficits and impairments:  Pain, Decreased strength, Increased muscle spasms, Decreased range of motion, Decreased activity tolerance, Postural dysfunction  Visit Diagnosis: Pain in right hip  Cramp and spasm  Muscle weakness (generalized)  Abnormal posture     Problem List Patient Active Problem List   Diagnosis Date Noted  . Tendinopathy of right gluteus medius 05/29/2016  . Chronic lumbar radiculopathy 03/08/2016  . Heart murmur, systolic 06/19/2014  . Disorder of SI (sacroiliac) joint 06/02/2014  . Functional gait abnormality 06/02/2014  . Ehlers-Danlos syndrome type III 03/10/2014  . Foot arch pain 03/10/2014  . Labral tear of shoulder 05/26/2013  . Left knee pain 05/26/2013  . 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 Synovitis of knee 08/05/2012  . Plantar fasciitis 02/05/2011  . Lateral epicondylitis of right elbow 10/12/2010  . Osteoarthritis of multiple joints 10/12/2010    Caprice Red  PT 08/13/2016, 9:29 AM  [facility] [address] [place], [place], [address] Phone: [phone]   Fax:  [phone]  Name: Nicole Oneill MRN: [id number] Date of Birth: [birth year]

## 2016-08-15 ENCOUNTER — Ambulatory Visit: Payer: BLUE CROSS/BLUE SHIELD | Admitting: Physical Therapy

## 2016-08-15 DIAGNOSIS — M25551 Pain in right hip: Secondary | ICD-10-CM

## 2016-08-15 DIAGNOSIS — R252 Cramp and spasm: Secondary | ICD-10-CM | POA: Diagnosis not present

## 2016-08-15 DIAGNOSIS — R293 Abnormal posture: Secondary | ICD-10-CM

## 2016-08-15 DIAGNOSIS — M6281 Muscle weakness (generalized): Secondary | ICD-10-CM | POA: Diagnosis not present

## 2016-08-15 NOTE — Patient Instructions (Addendum)
    [employer] Clam handout with instructions on how to perform first 3 exercises.  1-2 times per day, 10-20 reps each

## 2016-08-16 NOTE — Therapy (Signed)
[facility] Outpatient Rehabilitation [facility] [address] [place], [place], [address] Phone: [phone]   Fax:  [phone]  Physical Therapy Treatment  Patient Details  Name: Nicole Oneill MRN: [id number] Date of Birth: 04-24-1961 Referring Provider: Enid Baas, MD  Encounter Date: 08/15/2016      PT End of Session - 08/15/16 0852    Visit Number 7   Number of Visits 12   Date for PT Re-Evaluation 08/17/16   Authorization Type BCBS   PT Start Time 0845   PT Stop Time 0945   PT Time Calculation (min) 60 min      Past Medical History:  Diagnosis Date  . Anemia   . Arthritis    osteo  . Complication of anesthesia    urinary retention requiring indwelling  foley catheterization  . DDD (degenerative disc disease)   . Ehlers-Danlos syndrome type III   . Fibromyalgia   . History of blood transfusion   . Osteoarthritis     Past Surgical History:  Procedure Laterality Date  . BACK SURGERY     Cervical fusion C5-7, fusion  T10-S1  . DIAGNOSTIC LAPAROSCOPY     x 3  . EYE SURGERY     bilateral tear duct fusion  . JOINT REPLACEMENT     partial left knee 2005, total left knee 2012  . KNEE ARTHROSCOPY  08/06/2012   Procedure: ARTHROSCOPY KNEE;  Surgeon: Loanne Drilling, MD;  Location: [facility];  Service: Orthopedics;  Laterality: Left;  WITH SYNOVECTOMY and debridement  . OSTEOTOMY     bilateral tibial    There were no vitals filed for this visit.      Subjective Assessment - 08/15/16 0711    Subjective My pain is much better but I still feel weak    Currently in Pain? No/denies            [facility] PT Assessment - 08/16/16 0001      Strength   Right Hip ABduction 4-/5   Left Hip ABduction 4+/5                     [facility] Adult PT Treatment/Exercise - 08/16/16 0001      Knee/Hip Exercises: Supine   Other Supine Knee/Hip Exercises Prepilates pelvic tilts, neutral spine, knee drops, knee raises, and heel slides with cues to maintain  neutral spine, abdominal engagement and breathing ~ 10 reps ech.      Knee/Hip Exercises: Sidelying   Other Sidelying Knee/Hip Exercises commerford clam shell series exercises 1, 2 and 3 x 10-15 reps each, use band for initial clam only      Moist Heat Therapy   Number Minutes Moist Heat 15 Minutes   Moist Heat Location Hip                PT Education - 08/15/16 1018    Education provided Yes   Education Details Pilates and commerford clam HEP    Person(s) Educated Patient   Methods Explanation;Handout   Comprehension Verbalized understanding;Returned demonstration          PT Short Term Goals - 08/16/16 0712      PT SHORT TERM GOAL #1   Title She will be independent with inital HEP   Time 3   Period Weeks   Status Achieved     PT SHORT TERM GOAL #2   Title she will report pain 25% decr RT hip  with walking   Time 3  Period Weeks   Status Achieved     PT SHORT TERM GOAL #3   Title she demo 3+/5 RT hip abduction   Baseline 4-/5   Time 3   Period Weeks   Status Achieved           PT Long Term Goals - 08/16/16 [id number]      PT LONG TERM GOAL #1   Title She will be independent with all HEP issued   Time 6   Period Weeks   Status On-going     PT LONG TERM GOAL #2   Title She will report pain RT hip decreased 75% with sitting and walking   Time 6   Period Weeks   Status Achieved     PT LONG TERM GOAL #3   Title She will improve strength to 4/5 minimally RT hip all groups.    Baseline 4-/5 abduction   Period Weeks   Status On-going     PT LONG TERM GOAL #4   Title she will report driving with 56%50% less pain in RT hip   Time 6   Period Weeks   Status Achieved               Plan - 08/15/16 1020    Clinical Impression Statement Pt interested in Pilates. She was given basic prepilates mat exercises to perform while out of town next week. She demonstrates improved right hip strength however she is still weak complared to her left hip. She  was given the commerford clamshell progression for HEP as well.    PT Next Visit Plan check HEP strength hip ,review prepilates; REFORMER when ready;  modalities as needed, manual assess TDN   PT Home Exercise Plan clams amd stand hip abduction, shoulder bridge , hip adduction , PPT, wall sit, controlled stepping for balance and strength, mini squats with opp knee lift, sidesteps with band, knee to chest RT. commerford clamshell series 1-2, prepilates    Consulted and Agree with Plan of Care Patient      Patient will benefit from skilled therapeutic intervention in order to improve the following deficits and impairments:  Pain, Decreased strength, Increased muscle spasms, Decreased range of motion, Decreased activity tolerance, Postural dysfunction  Visit Diagnosis: Pain in right hip  Cramp and spasm  Muscle weakness (generalized)  Abnormal posture     Problem List Patient Active Problem List   Diagnosis Date Noted  . Tendinopathy of right gluteus medius 05/29/2016  . Chronic lumbar radiculopathy 03/08/2016  . Heart murmur, systolic 06/19/2014  . Disorder of SI (sacroiliac) joint 06/02/2014  . Functional gait abnormality 06/02/2014  . Ehlers-Danlos syndrome type III 03/10/2014  . Foot arch pain 03/10/2014  . Labral tear of shoulder 05/26/2013  . Left knee pain 05/26/2013  . 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 Synovitis of knee 08/05/2012  . Plantar fasciitis 02/05/2011  . Lateral epicondylitis of right elbow 10/12/2010  . Osteoarthritis of multiple joints 10/12/2010    Sherrie Mustacheonoho, Adoria Kawamoto McGee, PTA 08/16/2016, 10:59 AM  [facility] [address] [place], [place], [address] Phone: [phone]   Fax:  [phone]  Name: Nicole Oneill MRN: [id number] Date of Birth: 1961/06/15

## 2016-08-27 ENCOUNTER — Ambulatory Visit: Payer: BLUE CROSS/BLUE SHIELD | Admitting: Physical Therapy

## 2016-08-27 DIAGNOSIS — M25551 Pain in right hip: Secondary | ICD-10-CM | POA: Diagnosis not present

## 2016-08-27 DIAGNOSIS — R293 Abnormal posture: Secondary | ICD-10-CM | POA: Diagnosis not present

## 2016-08-27 DIAGNOSIS — R252 Cramp and spasm: Secondary | ICD-10-CM

## 2016-08-27 DIAGNOSIS — M6281 Muscle weakness (generalized): Secondary | ICD-10-CM | POA: Diagnosis not present

## 2016-08-27 NOTE — Therapy (Signed)
[facility] Outpatient Rehabilitation [facility] [address] [place], [place], [address] Phone: [phone]   Fax:  [phone]  Physical Therapy Treatment  Patient Details  Name: Nicole Oneill MRN: [id number] Date of Birth: 1961/04/19 Referring Provider: Enid Baas, MD  Encounter Date: 08/27/2016      PT End of Session - 08/27/16 1329    Visit Number 8   Number of Visits 16   Date for PT Re-Evaluation 09/21/16   PT Start Time 0932   PT Stop Time 1015   PT Time Calculation (min) 43 min   Activity Tolerance Patient tolerated treatment well   Behavior During Therapy [facility] for tasks assessed/performed      Past Medical History:  Diagnosis Date  . Anemia   . Arthritis    osteo  . Complication of anesthesia    urinary retention requiring indwelling  foley catheterization  . DDD (degenerative disc disease)   . Ehlers-Danlos syndrome type III   . Fibromyalgia   . History of blood transfusion   . Osteoarthritis     Past Surgical History:  Procedure Laterality Date  . BACK SURGERY     Cervical fusion C5-7, fusion  T10-S1  . DIAGNOSTIC LAPAROSCOPY     x 3  . EYE SURGERY     bilateral tear duct fusion  . JOINT REPLACEMENT     partial left knee 2005, total left knee 2012  . KNEE ARTHROSCOPY  08/06/2012   Procedure: ARTHROSCOPY KNEE;  Surgeon: Loanne Drilling, MD;  Location: [facility];  Service: Orthopedics;  Laterality: Left;  WITH SYNOVECTOMY and debridement  . OSTEOTOMY     bilateral tibial    There were no vitals filed for this visit.      Subjective Assessment - 08/27/16 0935    Subjective I really can't remember when my hip hurt last.  I want to start Pilates.    Currently in Pain? Yes   Pain Score 3    Pain Location Back   Pain Orientation Mid   Pain Descriptors / Indicators Sore   Pain Type Chronic pain   Pain Onset More than a month ago   Pain Frequency Constant   Pain Relieving Factors heat, laying, stretching    Pain Score 0   Pain  Location Hip   Pain Orientation Right            [facility] PT Assessment - 08/27/16 0939      Strength   Right Hip Flexion 4+/5   Right Hip Extension 3+/5   Right Hip ABduction 4-/5   Left Hip Flexion 4+/5   Left Hip Extension 4/5   Left Hip ABduction 4+/5   Right/Left Knee --  WFL and ankle WNL        Pilates Reformer used for LE/core strength, postural strength, lumbopelvic disassociation and core control.  Exercises included:  Footwork 2 Red 1 blue parallel on heels, arch and forefoot, has good awareness of neutral spine and pelvis.    Bridging 2 Red 1 blue flat back x 10   Supine Arm Arcs: 1 Red arcs and T press x 10 each        PT Education - 08/27/16 1329    Education provided Yes   Education Details Pilates Reformer and principles    Person(s) Educated Patient   Methods Explanation   Comprehension Verbalized understanding;Need further instruction          PT Short Term Goals - 08/16/16 [phone]  PT SHORT TERM GOAL #1   Title She will be independent with inital HEP   Time 3   Period Weeks   Status Achieved     PT SHORT TERM GOAL #2   Title she will report pain 25% decr RT hip  with walking   Time 3   Period Weeks   Status Achieved     PT SHORT TERM GOAL #3   Title she demo 3+/5 RT hip abduction   Baseline 4-/5   Time 3   Period Weeks   Status Achieved           PT Long Term Goals - 08/27/16 0945      PT LONG TERM GOAL #1   Title She will be independent with all HEP issued   Status On-going     PT LONG TERM GOAL #2   Title She will report pain RT hip decreased 75% with sitting and walking   Status Achieved     PT LONG TERM GOAL #3   Title She will improve strength to 4/5 minimally RT hip all groups.    Status On-going     PT LONG TERM GOAL #4   Title she will report driving with 40%50% less pain in RT hip   Status Achieved     PT LONG TERM GOAL #5   Title Pt will understand and demonstrate proper posture observed in clinic and  per patient report with ADLs.    Time 4   Period Weeks   Status New               Plan - 08/27/16 1333    Clinical Impression Statement Patient did well today with simple Reformer exercises.  Strength is improving but she would like to be stronger, have better posture.  Will cont with POC to integrate PIlates into her therapeutic exercise.     PT Frequency 2x / week   PT Duration 4 weeks   PT Treatment/Interventions Cryotherapy;Ultrasound;Dry needling;Taping;Manual techniques;Passive range of motion;Patient/family education;Therapeutic exercise;Moist Heat;Iontophoresis 4mg /ml Dexamethasone  Pilates    PT Next Visit Plan check HEP strength hip ,review prepilates; REFORMER when ready;  modalities as needed, manual assess TDN   PT Home Exercise Plan clams amd stand hip abduction, shoulder bridge , hip adduction , PPT, wall sit, controlled stepping for balance and strength, mini squats with opp knee lift, sidesteps with band, knee to chest RT. [place] clamshell series 1-2, prepilates    Consulted and Agree with Plan of Care Patient      Patient will benefit from skilled therapeutic intervention in order to improve the following deficits and impairments:  Pain, Decreased strength, Increased muscle spasms, Decreased range of motion, Decreased activity tolerance, Postural dysfunction  Visit Diagnosis: Pain in right hip  Cramp and spasm  Muscle weakness (generalized)  Abnormal posture     Problem List Patient Active Problem List   Diagnosis Date Noted  . Tendinopathy of right gluteus medius 05/29/2016  . Chronic lumbar radiculopathy 03/08/2016  . Heart murmur, systolic 06/19/2014  . Disorder of SI (sacroiliac) joint 06/02/2014  . Functional gait abnormality 06/02/2014  . Ehlers-Danlos syndrome type III 03/10/2014  . Foot arch pain 03/10/2014  . Labral tear of shoulder 05/26/2013  . Left knee pain 05/26/2013  . 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 Synovitis of knee 08/05/2012   . Plantar fasciitis 02/05/2011  . Lateral epicondylitis of right elbow 10/12/2010  . Osteoarthritis of multiple joints 10/12/2010    Saidy Ormand 08/27/2016, 1:37 PM  [facility] Outpatient Rehabilitation [facility] [address] [place], [place], [address] Phone: [phone]   Fax:  [phone]  Name: KATARINA RIEBE MRN: [id number] Date of Birth: 02-Mar-1961   Karie Mainland, PT 08/27/16 1:42 PM Phone: [phone] Fax: [phone]

## 2016-08-27 NOTE — Addendum Note (Signed)
Addended by: Karie MainlandPAA, JENNIFER L on: 08/27/2016 01:47 PM   Modules accepted: Orders

## 2016-08-29 ENCOUNTER — Ambulatory Visit: Payer: BLUE CROSS/BLUE SHIELD | Admitting: Physical Therapy

## 2016-08-29 DIAGNOSIS — R293 Abnormal posture: Secondary | ICD-10-CM

## 2016-08-29 DIAGNOSIS — R252 Cramp and spasm: Secondary | ICD-10-CM | POA: Diagnosis not present

## 2016-08-29 DIAGNOSIS — M25551 Pain in right hip: Secondary | ICD-10-CM | POA: Diagnosis not present

## 2016-08-29 DIAGNOSIS — M6281 Muscle weakness (generalized): Secondary | ICD-10-CM | POA: Diagnosis not present

## 2016-08-29 NOTE — Therapy (Signed)
[facility] Outpatient Rehabilitation [facility] [address] [place], [place], [address] Phone: [phone]   Fax:  [phone]  Physical Therapy Treatment  Patient Details  Name: Nicole Oneill MRN: [id number] Date of Birth: 12/02/1960 Referring Provider: Enid Baas, MD  Encounter Date: 08/29/2016      PT End of Session - 08/29/16 0902    Visit Number 9   Number of Visits 16   Date for PT Re-Evaluation 09/21/16   PT Start Time 0852  pt 5 ,min late    PT Stop Time 0930   PT Time Calculation (min) 38 min   Activity Tolerance Patient tolerated treatment well   Behavior During Therapy [facility] for tasks assessed/performed      Past Medical History:  Diagnosis Date  . Anemia   . Arthritis    osteo  . Complication of anesthesia    urinary retention requiring indwelling  foley catheterization  . DDD (degenerative disc disease)   . Ehlers-Danlos syndrome type III   . Fibromyalgia   . History of blood transfusion   . Osteoarthritis     Past Surgical History:  Procedure Laterality Date  . BACK SURGERY     Cervical fusion C5-7, fusion  T10-S1  . DIAGNOSTIC LAPAROSCOPY     x 3  . EYE SURGERY     bilateral tear duct fusion  . JOINT REPLACEMENT     partial left knee 2005, total left knee 2012  . KNEE ARTHROSCOPY  08/06/2012   Procedure: ARTHROSCOPY KNEE;  Surgeon: Loanne Drilling, MD;  Location: [facility];  Service: Orthopedics;  Laterality: Left;  WITH SYNOVECTOMY and debridement  . OSTEOTOMY     bilateral tibial    There were no vitals filed for this visit.      Subjective Assessment - 08/29/16 0858    Subjective No pain at all, my back felt really good after last session.    Currently in Pain? No/denies           Pilates Tower for LE/Core strength, postural strength, lumbopelvic disassociation and core control.  Exercises included: [place] for all Ex   Supine Leg [place] single and double leg arcs and circles , foot cramped frequently which  improved with ankle DF.  Needed guarding to ensure foot safe in strap.   Sidelying Leg [place] hip add/abd, small ROM circles and single leg squat , manual A to maintain 90 deg and knee on safe plane   [place] Arcs: very challenging to keep scap down did bilateal arms and then single added opposite  leg SLR  Cat Seated with push through bar, feels more restriction in adductors than spine, overhead felt good for her shoulders            PT Education - 08/29/16 0902    Education provided Yes   Education Details Pilates Tower    Person(s) Educated Patient   Methods Explanation   Comprehension Verbalized understanding;Need further instruction          PT Short Term Goals - 08/29/16 1247      PT SHORT TERM GOAL #1   Title She will be independent with inital HEP   Status Achieved     PT SHORT TERM GOAL #2   Title she will report pain 25% decr RT hip  with walking   Status Achieved     PT SHORT TERM GOAL #3   Title she demo 3+/5 RT hip abduction   Status Achieved  PT Long Term Goals - 08/27/16 0945      PT LONG TERM GOAL #1   Title She will be independent with all HEP issued   Status On-going     PT LONG TERM GOAL #2   Title She will report pain RT hip decreased 75% with sitting and walking   Status Achieved     PT LONG TERM GOAL #3   Title She will improve strength to 4/5 minimally RT hip all groups.    Status On-going     PT LONG TERM GOAL #4   Title she will report driving with 16%50% less pain in RT hip   Status Achieved     PT LONG TERM GOAL #5   Title Pt will understand and demonstrate proper posture observed in clinic and per patient report with ADLs.    Time 4   Period Weeks   Status New               Plan - 08/29/16 1243    Clinical Impression Statement Pt without increased back pain with Tower exercises.  She had intermittent cramping of Rt. arch of foot and also had some Rt. sided upper back pain. Needed cues for alignment  and neutral pelvis.    PT Next Visit Plan FOTO check HEP strength hip ,review prepilates;cont REFORMER when ready;     PT Home Exercise Plan clams amd stand hip abduction, shoulder bridge , hip adduction , PPT, wall sit, controlled stepping for balance and strength, mini squats with opp knee lift, sidesteps with band, knee to chest RT. [place] clamshell series 1-2, prepilates    Consulted and Agree with Plan of Care Patient      Patient will benefit from skilled therapeutic intervention in order to improve the following deficits and impairments:  Pain, Decreased strength, Increased muscle spasms, Decreased range of motion, Decreased activity tolerance, Postural dysfunction  Visit Diagnosis: Pain in right hip  Cramp and spasm  Muscle weakness (generalized)  Abnormal posture     Problem List Patient Active Problem List   Diagnosis Date Noted  . Tendinopathy of right gluteus medius 05/29/2016  . Chronic lumbar radiculopathy 03/08/2016  . Heart murmur, systolic 06/19/2014  . Disorder of SI (sacroiliac) joint 06/02/2014  . Functional gait abnormality 06/02/2014  . Ehlers-Danlos syndrome type III 03/10/2014  . Foot arch pain 03/10/2014  . Labral tear of shoulder 05/26/2013  . Left knee pain 05/26/2013  . 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 Synovitis of knee 08/05/2012  . Plantar fasciitis 02/05/2011  . Lateral epicondylitis of right elbow 10/12/2010  . Osteoarthritis of multiple joints 10/12/2010    Oneill,Nicole 08/29/2016, 12:49 PM  [facility] [address] [place], [place], [address] Phone: [phone]   Fax:  [phone]  Name: Nicole HughesJulia G Oneill MRN: [id number] Date of Birth: 1961-02-24  Karie MainlandJennifer Oneill, PT 08/29/16 12:50 PM Phone: [phone] Fax: [phone]

## 2016-09-03 ENCOUNTER — Ambulatory Visit: Payer: BLUE CROSS/BLUE SHIELD | Admitting: Physical Therapy

## 2016-09-03 ENCOUNTER — Telehealth (INDEPENDENT_AMBULATORY_CARE_PROVIDER_SITE_OTHER): Payer: Self-pay | Admitting: *Deleted

## 2016-09-03 DIAGNOSIS — R293 Abnormal posture: Secondary | ICD-10-CM | POA: Diagnosis not present

## 2016-09-03 DIAGNOSIS — M6281 Muscle weakness (generalized): Secondary | ICD-10-CM

## 2016-09-03 DIAGNOSIS — R252 Cramp and spasm: Secondary | ICD-10-CM

## 2016-09-03 DIAGNOSIS — M25551 Pain in right hip: Secondary | ICD-10-CM

## 2016-09-03 NOTE — Therapy (Signed)
[facility] Outpatient Rehabilitation [facility] [address] [place], [place], [address] Phone: [phone]   Fax:  [phone]  Physical Therapy Treatment  Patient Details  Name: Nicole Oneill MRN: [id number] Date of Birth: 12-25-1960 Referring Provider: Enid Baas, MD  Encounter Date: 09/03/2016      PT End of Session - 09/03/16 0941    Visit Number 10   Number of Visits 16   Date for PT Re-Evaluation 09/21/16   PT Start Time 0933   PT Stop Time 1025   PT Time Calculation (min) 52 min   Activity Tolerance Patient tolerated treatment well   Behavior During Therapy [facility] for tasks assessed/performed      Past Medical History:  Diagnosis Date  . Anemia   . Arthritis    osteo  . Complication of anesthesia    urinary retention requiring indwelling  foley catheterization  . DDD (degenerative disc disease)   . Ehlers-Danlos syndrome type III   . Fibromyalgia   . History of blood transfusion   . Osteoarthritis     Past Surgical History:  Procedure Laterality Date  . BACK SURGERY     Cervical fusion C5-7, fusion  T10-S1  . DIAGNOSTIC LAPAROSCOPY     x 3  . EYE SURGERY     bilateral tear duct fusion  . JOINT REPLACEMENT     partial left knee 2005, total left knee 2012  . KNEE ARTHROSCOPY  08/06/2012   Procedure: ARTHROSCOPY KNEE;  Surgeon: Loanne Drilling, MD;  Location: [facility];  Service: Orthopedics;  Laterality: Left;  WITH SYNOVECTOMY and debridement  . OSTEOTOMY     bilateral tibial    There were no vitals filed for this visit.      Subjective Assessment - 09/03/16 0938    Subjective No pain today, has not really had hip pain this weekend.  Was a bit sore after last session.    Currently in Pain? No/denies         Pilates Reformer used for LE/core strength, postural strength, lumbopelvic disassociation and core control.  Exercises included:  Footwork: 4 springs parallel and turnout on heels and forefoot, heel raises and stretch  Single  leg 2 red 1 blue parallel heel  Bridging 4 springs  X 10 added a press out for further challenge  Feet in Straps squats, hip adduction and circles 1 red  1 yellow  sidelying single leg press 2 red and clam (diamond) x 20         [facility] Adult PT Treatment/Exercise - 09/03/16 1525      Knee/Hip Exercises: Aerobic   Nustep UE and LE for 6 min level 7      Moist Heat Therapy   Number Minutes Moist Heat 10 Minutes   Moist Heat Location Lumbar Spine                PT Education - 09/03/16 1518    Education provided Yes   Education Details cont with HEP, Pilates    Person(s) Educated Patient   Methods Explanation;Demonstration   Comprehension Verbalized understanding;Returned demonstration          PT Short Term Goals - 08/29/16 1247      PT SHORT TERM GOAL #1   Title She will be independent with inital HEP   Status Achieved     PT SHORT TERM GOAL #2   Title she will report pain 25% decr RT hip  with walking   Status Achieved  PT SHORT TERM GOAL #3   Title she demo 3+/5 RT hip abduction   Status Achieved           PT Long Term Goals - 09/03/16 1523      PT LONG TERM GOAL #1   Title She will be independent with all HEP issued     PT LONG TERM GOAL #2   Title She will report pain RT hip decreased 75% with sitting and walking   Status Achieved     PT LONG TERM GOAL #3   Title She will improve strength to 4/5 minimally RT hip all groups.    Status Unable to assess     PT LONG TERM GOAL #4   Title she will report driving with 96%50% less pain in RT hip   Status Achieved     PT LONG TERM GOAL #5   Title Pt will understand and demonstrate proper posture observed in clinic and per patient report with ADLs.    Status On-going               Plan - 09/03/16 1010    Clinical Impression Statement Worked on strengthening hips and knees using Pilates equipment.  Appear to have Rt. LE longer than L.  decreased strength bilateral hip abd, glutes with clam  exercises.  No pain in hip in some time now, progressing.    PT Next Visit Plan check MMT in hips, check in standing with step ups and step downs, single leg squats , Reformer if time.    PT Home Exercise Plan clams amd stand hip abduction, shoulder bridge , hip adduction , PPT, wall sit, controlled stepping for balance and strength, mini squats with opp knee lift, sidesteps with band, knee to chest RT. [place] clamshell series 1-2, prepilates    Consulted and Agree with Plan of Care Patient      Patient will benefit from skilled therapeutic intervention in order to improve the following deficits and impairments:  Pain, Decreased strength, Increased muscle spasms, Decreased range of motion, Decreased activity tolerance, Postural dysfunction  Visit Diagnosis: Pain in right hip  Cramp and spasm  Muscle weakness (generalized)  Abnormal posture     Problem List Patient Active Problem List   Diagnosis Date Noted  . Tendinopathy of right gluteus medius 05/29/2016  . Chronic lumbar radiculopathy 03/08/2016  . Heart murmur, systolic 06/19/2014  . Disorder of SI (sacroiliac) joint 06/02/2014  . Functional gait abnormality 06/02/2014  . Ehlers-Danlos syndrome type III 03/10/2014  . Foot arch pain 03/10/2014  . Labral tear of shoulder 05/26/2013  . Left knee pain 05/26/2013  . 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 Synovitis of knee 08/05/2012  . Plantar fasciitis 02/05/2011  . Lateral epicondylitis of right elbow 10/12/2010  . Osteoarthritis of multiple joints 10/12/2010    PAA,JENNIFER 09/03/2016, 3:25 PM  [facility] [address] [place], [place], [address] Phone: [phone]   Fax:  [phone]  Name: Denzil HughesJulia G Puleo MRN: [id number] Date of Birth: 01-15-1961  Karie MainlandJennifer Paa, PT 09/03/16 3:27 PM Phone: [phone] Fax: [phone]

## 2016-09-03 NOTE — Telephone Encounter (Signed)
Received call from [place] at [facility] stating pt is wanting to get her Ambien early and needs the ok to fill it. Please call [phone].

## 2016-09-03 NOTE — Telephone Encounter (Signed)
Please call patient in these situations to find out why she is trying to get the prescription early. I tried to call patient at 5:17 PM she had left port by now and I did not have any other contact number for her. I also called the pharmacy and it did not know why she was trying to refill her Ambien early. Please contact patient tomorrow to find out the details and then discuss with me thank you.

## 2016-09-04 NOTE — Telephone Encounter (Signed)
contacted pharmacy and advised okay to fill early,.

## 2016-09-04 NOTE — Telephone Encounter (Signed)
Patient states she is going out of town for over a week and does not have enough Ambien to carry her through and is requesting to fill her prescription early.

## 2016-09-04 NOTE — Telephone Encounter (Signed)
Ok to refill 

## 2016-09-05 ENCOUNTER — Ambulatory Visit: Payer: BLUE CROSS/BLUE SHIELD | Admitting: Physical Therapy

## 2016-09-05 DIAGNOSIS — R293 Abnormal posture: Secondary | ICD-10-CM

## 2016-09-05 DIAGNOSIS — M6281 Muscle weakness (generalized): Secondary | ICD-10-CM

## 2016-09-05 DIAGNOSIS — M25551 Pain in right hip: Secondary | ICD-10-CM | POA: Diagnosis not present

## 2016-09-05 DIAGNOSIS — R252 Cramp and spasm: Secondary | ICD-10-CM

## 2016-09-05 NOTE — Therapy (Signed)
[place], [place], [address] Phone: [phone]   Fax:  [phone]  Physical Therapy Treatment/Discharge Patient Details  Name: Nicole Oneill MRN: [id number] Date of Birth: Feb 27, 1961 Referring Provider: Stefanie Libel, MD  Encounter Date: 09/05/2016      PT End of Session - 09/05/16 1008    Visit Number 11   Number of Visits 16   Date for PT Re-Evaluation 09/21/16   Authorization Type BC[employer]   PT Start Time 0932   PT Stop Time 1015   PT Time Calculation (min) 43 min   Activity Tolerance Patient tolerated treatment well   Behavior During Therapy [facility] for tasks assessed/performed      Past Medical History:  Diagnosis Date  . Anemia   . Arthritis    osteo  . Complication of anesthesia    urinary retention requiring indwelling  foley catheterization  . DDD (degenerative disc disease)   . Ehlers-Danlos syndrome type III   . Fibromyalgia   . History of blood transfusion   . Osteoarthritis     Past Surgical History:  Procedure Laterality Date  . BACK SURGERY     Cervical fusion C5-7, fusion  T10-S1  . DIAGNOSTIC LAPAROSCOPY     x 3  . EYE SURGERY     bilateral tear duct fusion  . JOINT REPLACEMENT     partial left knee 2005, total left knee 2012  . KNEE ARTHROSCOPY  08/06/2012   Procedure: ARTHROSCOPY KNEE;  Surgeon: Gearlean Alf, MD;  Location: [facility];  Service: Orthopedics;  Laterality: Left;  WITH SYNOVECTOMY and debridement  . OSTEOTOMY     bilateral tibial    There were no vitals filed for this visit.      Subjective Assessment - 09/05/16 0949    Subjective Not much pain today.    Currently in Pain? No/denies         Pilates Reformer used for LE/core strength, postural strength, lumbopelvic disassociation and core control.  Exercises included:  Footwork single leg 2 red x 10 and 2 red 1 blue   Watches pelvic stability  Bridging x 10  Added clam and single leg bridge  2 red  1 blue  Hamstring stretch each leg, tightness after clam  Piriformis stretch each side intermittently          [facility] Adult PT Treatment/Exercise - 09/05/16 0952      Lumbar Exercises: Machines for Strengthening   Other Lumbar Machine Exercise Reformer: pulling straps 1 blue, glute press 1 Red spring      Knee/Hip Exercises: Stretches   Piriformis Stretch Both;1 rep;60 seconds     Knee/Hip Exercises: Standing   Lateral Step Up Both;1 set;15 reps;Hand Hold: 1;Step Height: 6"   Lateral Step Up Limitations for hip stability assessment , weaker on R.r   Step Down Both;1 set;15 reps;Hand Hold: 2;Step Height: 6"   Step Down Limitations for hip stab assessment      Knee/Hip Exercises: Sidelying   Other Sidelying Knee/Hip Exercises on Long box on Reformer: hip abd, bilat leg lift, sidekicks x 10                   PT Short Term Goals - 08/29/16 1247      PT SHORT TERM GOAL #1   Title She will be independent with inital HEP   Status Achieved     PT SHORT TERM GOAL #2   Title she will report pain 25%  decr RT hip  with walking   Status Achieved     PT SHORT TERM GOAL #3   Title she demo 3+/5 RT hip abduction   Status Achieved           PT Long Term Goals - 09/05/16 1011      PT LONG TERM GOAL #1   Title She will be independent with all HEP issued   Status Achieved     PT LONG TERM GOAL #2   Title She will report pain RT hip decreased 75% with sitting and walking   Status Achieved     PT LONG TERM GOAL #3   Title She will improve strength to 4/5 minimally RT hip all groups.    Baseline 4/5    Status Achieved     PT LONG TERM GOAL #4   Title she will report driving with 07% less pain in RT hip   Status Achieved     PT LONG TERM GOAL #5   Title Pt will understand and demonstrate proper posture observed in clinic and per patient report with ADLs.    Status Partially Met               Plan - 09/05/16 1251    Clinical Impression Statement Patient  would like to be discharged  She has no pain in her hip that limits hr activity but cont to have lateral hip weakness.  She will cont with PT Pilates.     PT Next Visit Plan NA    PT Home Exercise Plan clams amd stand hip abduction, shoulder bridge , hip adduction , PPT, wall sit, controlled stepping for balance and strength, mini squats with opp knee lift, sidesteps with band, knee to chest RT. [place] clamshell series 1-2, prepilates    Consulted and Agree with Plan of Care Patient      Patient will benefit from skilled therapeutic intervention in order to improve the following deficits and impairments:     Visit Diagnosis: Pain in right hip  Cramp and spasm  Muscle weakness (generalized)  Abnormal posture     Problem List Patient Active Problem List   Diagnosis Date Noted  . Tendinopathy of right gluteus medius 05/29/2016  . Chronic lumbar radiculopathy 03/08/2016  . Heart murmur, systolic 37/04/6268  . Disorder of SI (sacroiliac) joint 06/02/2014  . Functional gait abnormality 06/02/2014  . Ehlers-Danlos syndrome type III 03/10/2014  . Foot arch pain 03/10/2014  . Labral tear of shoulder 05/26/2013  . Left knee pain 05/26/2013  . [place] arthritis 05/26/2013  . Synovitis of knee 08/05/2012  . Plantar fasciitis 02/05/2011  . Lateral epicondylitis of right elbow 10/12/2010  . Osteoarthritis of multiple joints 10/12/2010    PAA,JENNIFER 09/05/2016, 12:55 PM  [facility] [address] [place], [place], [address] Phone: [phone]   Fax:  [phone]  Name: Nicole Oneill MRN: [id number] Date of Birth: [birth year]  PHYSICAL THERAPY DISCHARGE SUMMARY  Visits from Start of Care: 11  Current functional level related to goals / functional outcomes: See above    Remaining deficits: Weakness, ankle pain, intermittent back and occ hip pain    Education / Equipment: HEP, stability, PIlates  Plan: Patient agrees to  discharge.  Patient goals were met. Patient is being discharged due to being pleased with the current functional level.  ?????    Raeford Razor, PT 09/05/16 12:57 PM Phone: [phone] Fax: [phone]

## 2016-09-06 DIAGNOSIS — H1859 Other hereditary corneal dystrophies: Secondary | ICD-10-CM | POA: Diagnosis not present

## 2016-09-06 DIAGNOSIS — H04123 Dry eye syndrome of bilateral lacrimal glands: Secondary | ICD-10-CM | POA: Diagnosis not present

## 2016-09-06 DIAGNOSIS — H5213 Myopia, bilateral: Secondary | ICD-10-CM | POA: Diagnosis not present

## 2016-09-24 ENCOUNTER — Other Ambulatory Visit: Payer: Self-pay | Admitting: Rheumatology

## 2016-09-24 NOTE — Telephone Encounter (Signed)
Last Visit: 04/24/16 Next Visit due April 2018. Message sent to the front to schedule patient  Okay to refill Ambien CR?

## 2016-09-25 ENCOUNTER — Telehealth: Payer: Self-pay | Admitting: Rheumatology

## 2016-09-25 NOTE — Telephone Encounter (Signed)
LVMOM for patient to call the office back to schedule her follow up appointment. °

## 2016-09-25 NOTE — Telephone Encounter (Signed)
-----   Message from Henriette CombsAndrea L Hatton, LPN sent at 1/61/09603/19/2018  3:26 PM EDT ----- Regarding: Please schedule follow up visit Please schedule follow up visit. Patient is due in April 2018. Thanks!

## 2016-10-03 ENCOUNTER — Other Ambulatory Visit: Payer: Self-pay | Admitting: Rheumatology

## 2016-10-04 ENCOUNTER — Other Ambulatory Visit: Payer: Self-pay | Admitting: Internal Medicine

## 2016-10-04 DIAGNOSIS — R51 Headache: Principal | ICD-10-CM

## 2016-10-04 DIAGNOSIS — M797 Fibromyalgia: Secondary | ICD-10-CM | POA: Diagnosis not present

## 2016-10-04 DIAGNOSIS — R5383 Other fatigue: Secondary | ICD-10-CM | POA: Diagnosis not present

## 2016-10-04 DIAGNOSIS — R519 Headache, unspecified: Secondary | ICD-10-CM

## 2016-10-04 DIAGNOSIS — M859 Disorder of bone density and structure, unspecified: Secondary | ICD-10-CM | POA: Diagnosis not present

## 2016-10-04 DIAGNOSIS — Z0000 Encounter for general adult medical examination without abnormal findings: Secondary | ICD-10-CM | POA: Diagnosis not present

## 2016-10-04 DIAGNOSIS — D649 Anemia, unspecified: Secondary | ICD-10-CM | POA: Diagnosis not present

## 2016-10-04 DIAGNOSIS — Z1389 Encounter for screening for other disorder: Secondary | ICD-10-CM | POA: Diagnosis not present

## 2016-10-04 DIAGNOSIS — D6489 Other specified anemias: Secondary | ICD-10-CM | POA: Diagnosis not present

## 2016-10-04 DIAGNOSIS — F329 Major depressive disorder, single episode, unspecified: Secondary | ICD-10-CM | POA: Diagnosis not present

## 2016-10-11 ENCOUNTER — Other Ambulatory Visit: Payer: BLUE CROSS/BLUE SHIELD

## 2016-10-15 DIAGNOSIS — M170 Bilateral primary osteoarthritis of knee: Secondary | ICD-10-CM | POA: Insufficient documentation

## 2016-10-15 DIAGNOSIS — M47816 Spondylosis without myelopathy or radiculopathy, lumbar region: Secondary | ICD-10-CM | POA: Insufficient documentation

## 2016-10-15 DIAGNOSIS — M5134 Other intervertebral disc degeneration, thoracic region: Secondary | ICD-10-CM | POA: Insufficient documentation

## 2016-10-15 DIAGNOSIS — M3501 Sicca syndrome with keratoconjunctivitis: Secondary | ICD-10-CM | POA: Insufficient documentation

## 2016-10-15 DIAGNOSIS — M47812 Spondylosis without myelopathy or radiculopathy, cervical region: Secondary | ICD-10-CM | POA: Insufficient documentation

## 2016-10-15 NOTE — Progress Notes (Signed)
Office Visit Note  Patient: Nicole Oneill             Date of Birth: 1961-02-25           MRN: [id number]             PCP: Ezequiel Kayser, MD Referring: Deretha Emory, MD Visit Date: 10/25/2016 Occupation: @    Subjective:  Possible thrush   History of Present Illness: Nicole Oneill is a 56 y.o. female with history of degenerative disc disease and osteoarthritis she states she is doing fairly well with her pain. She's experienced some popping sensation in her left shoulder which she denies any joint swelling. Left total knee replacement is doing well. Her sicca symptoms are tolerable with over-the-counter medications and the eyedrops. She also complains of some vaginal yeast infection.  Activities of Daily Living:  Patient reports morning stiffness for 0 minute.   Patient Denies nocturnal pain.  Difficulty dressing/grooming: Denies Difficulty climbing stairs: Denies Difficulty getting out of chair: Denies Difficulty using hands for taps, buttons, cutlery, and/or writing: Denies   Review of Systems  Constitutional: Negative for fatigue, night sweats, weight gain, weight loss and weakness.  HENT: Positive for mouth dryness. Negative for mouth sores, trouble swallowing, trouble swallowing and nose dryness.   Eyes: Positive for dryness. Negative for pain, redness and visual disturbance.  Respiratory: Negative for cough, shortness of breath and difficulty breathing.   Cardiovascular: Negative for chest pain, palpitations, hypertension, irregular heartbeat and swelling in legs/feet.  Gastrointestinal: Negative for blood in stool, constipation and diarrhea.  Endocrine: Negative for increased urination.  Genitourinary: Negative for vaginal dryness.  Musculoskeletal: Positive for arthralgias and joint pain. Negative for joint swelling, myalgias, muscle weakness, morning stiffness, muscle tenderness and myalgias.  Skin: Negative for color change, rash, hair loss, skin tightness,  ulcers and sensitivity to sunlight.  Allergic/Immunologic: Negative for susceptible to infections.  Neurological: Negative for dizziness, memory loss and night sweats.  Hematological: Negative for swollen glands.  Psychiatric/Behavioral: Negative for depressed mood and sleep disturbance. The patient is not nervous/anxious.     PMFS History:  Patient Active Problem List   Diagnosis Date Noted  . History of lumbar fusion 10/16/2016  . History of fusion of cervical spine 10/16/2016  . History of total knee arthroplasty, left 2012 10/16/2016  . Other fatigue 10/16/2016  . Primary insomnia 10/16/2016  . DDD lumbar 10/15/2016  . DJD (degenerative joint disease), cervical 10/15/2016  . DDD (degenerative disc disease), thoracic 10/15/2016  . Primary osteoarthritis of both knees 10/15/2016  . Sjogren's syndrome with keratoconjunctivitis sicca (HCC) 10/15/2016  . Tendinopathy of right gluteus medius 05/29/2016  . Chronic lumbar radiculopathy 03/08/2016  . Heart murmur, systolic 06/19/2014  . Disorder of SI (sacroiliac) joint 06/02/2014  . Functional gait abnormality 06/02/2014  . Ehlers-Danlos syndrome type III 03/10/2014  . Foot arch pain 03/10/2014  . Labral tear of shoulder 05/26/2013  . Left knee pain 05/26/2013  . 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 Synovitis of knee 08/05/2012  . Plantar fasciitis 02/05/2011  . Lateral epicondylitis of right elbow 10/12/2010  . Osteoarthritis of multiple joints 10/12/2010    Past Medical History:  Diagnosis Date  . Anemia   . Arthritis    osteo  . Complication of anesthesia    urinary retention requiring indwelling  foley catheterization  . DDD (degenerative disc disease)   . Ehlers-Danlos syndrome type III   . Fibromyalgia   . History of blood transfusion   .  Osteoarthritis     Family History  Problem Relation Age of Onset  . Stroke Mother     hemorrhagic  . Hypertension Father   . Cancer - Prostate Father   . Anorexia nervosa Sister   .  Heart attack Maternal Grandmother   . Stroke Maternal Grandfather   . Cancer - Colon Paternal Grandmother   . Cancer - Colon Paternal Grandfather    Past Surgical History:  Procedure Laterality Date  . BACK SURGERY     Cervical fusion C5-7, fusion  T10-S1  . DIAGNOSTIC LAPAROSCOPY     x 3  . EYE SURGERY     bilateral tear duct fusion  . JOINT REPLACEMENT     partial left knee 2005, total left knee 2012  . KNEE ARTHROSCOPY  08/06/2012   Procedure: ARTHROSCOPY KNEE;  Surgeon: Loanne Drilling, MD;  Location: [facility];  Service: Orthopedics;  Laterality: Left;  WITH SYNOVECTOMY and debridement  . OSTEOTOMY     bilateral tibial   Social History   Social History Narrative   Patient lives at home with family.   Caffeine Use: 4-5 sodas weekly     Objective: Vital Signs: BP 122/66   Pulse 76   Resp 14   Ht 5' 5.5" (1.664 m)   Wt 128 lb (58.1 kg)   BMI 20.98 kg/m    Physical Exam  Constitutional: She is oriented to person, place, and time. She appears well-developed and well-nourished.  HENT:  Head: Normocephalic and atraumatic.  She has mild white plaque on her tongue consistent with thrush  Eyes: Conjunctivae and EOM are normal.  Neck: Normal range of motion.  Cardiovascular: Normal rate, regular rhythm, normal heart sounds and intact distal pulses.   Pulmonary/Chest: Effort normal and breath sounds normal.  Abdominal: Soft. Bowel sounds are normal.  Lymphadenopathy:    She has no cervical adenopathy.  Neurological: She is alert and oriented to person, place, and time.  Skin: Skin is warm and dry. Capillary refill takes less than 2 seconds.  Psychiatric: She has a normal mood and affect. Her behavior is normal.  Nursing note and vitals reviewed.    Musculoskeletal Exam: C-spine good range of motion she has limited range of motion of her thoracic and lumbar spine. Shoulder joints elbow joints wrist joints MCPs PIPs DIPs are good range of motion with no synovitis. Hip joints  are good range of motion. Her left total knee replacement is doing well. Although joints afford range of motion with no synovitis.  CDAI Exam: No CDAI exam completed.    Investigation: Findings:  04/16/2016 CBC normal, CMP normal, vitamin D 45    Imaging: No results found.  Speciality Comments: No specialty comments available.    Procedures:  No procedures performed Allergies: Patient has no known allergies.   Assessment / Plan:     Visit Diagnoses: DDD lumbar - Status post fusion she has limited range of motion of her lumbar spine  DJD (degenerative joint disease), cervical - Status post fusion. She has fairly good range of motion of her C-spine  DDD (degenerative disc disease), thoracic - Status post fusion. Limited range of motion of her thoracic spine due to fusion  Primary osteoarthritis of both hands: Doing well. She has bilateral severe CMC arthritis joint protection was discussed. She is on long-term Mobic. We will check labs today. I also discussed decreasing noted possibly. She was advised to take NSAIDs with meals and side effects were discussed.  Primary osteoarthritis of  both knees - Left total knee replacement: Doing fairly well  History of total knee arthroplasty, left 2012  Oral thrush and vaginal yeast infection: We will give prescription for Diflucan today.  Sicca, unspecified type [facility]): Over-the-counter meds have been helpful.  Ehlers-Danlos syndrome type III - Followed up by Dr. Darrick Penna  Heart murmur, systolic  Other fatigue  Primary insomnia  Myofascial pain   She has Ehlers-Danlos syndrome and hypermobility, which makes her more prone towards osteoarthritis also  Orders: Orders Placed This Encounter  Procedures  . CBC with Differential/Platelet  . COMPLETE METABOLIC PANEL WITH GFR   Meds ordered this encounter  Medications  . fluconazole (DIFLUCAN) 150 MG tablet    Sig: Take 1 tablet (150 mg total) by mouth every 3 (three) days.     Dispense:  3 tablet    Refill:  0    Face-to-face time spent with patient was 30 minutes. 50% of time was spent in counseling and coordination of care.  Follow-Up Instructions: Return in about 6 months (around 04/26/2017) for DDD, OA.   Pollyann Savoy, MD  Note - This record has been created using Animal nutritionist.  Chart creation errors have been sought, but may not always  have been located. Such creation errors do not reflect on  the standard of medical care.

## 2016-10-16 DIAGNOSIS — R5383 Other fatigue: Secondary | ICD-10-CM | POA: Insufficient documentation

## 2016-10-16 DIAGNOSIS — F5101 Primary insomnia: Secondary | ICD-10-CM | POA: Insufficient documentation

## 2016-10-16 DIAGNOSIS — Z981 Arthrodesis status: Secondary | ICD-10-CM | POA: Insufficient documentation

## 2016-10-16 DIAGNOSIS — Z96652 Presence of left artificial knee joint: Secondary | ICD-10-CM | POA: Insufficient documentation

## 2016-10-17 ENCOUNTER — Other Ambulatory Visit: Payer: Self-pay | Admitting: Obstetrics and Gynecology

## 2016-10-17 DIAGNOSIS — Z1231 Encounter for screening mammogram for malignant neoplasm of breast: Secondary | ICD-10-CM

## 2016-10-25 ENCOUNTER — Encounter: Payer: Self-pay | Admitting: Rheumatology

## 2016-10-25 ENCOUNTER — Ambulatory Visit (INDEPENDENT_AMBULATORY_CARE_PROVIDER_SITE_OTHER): Payer: BLUE CROSS/BLUE SHIELD | Admitting: Rheumatology

## 2016-10-25 VITALS — BP 122/66 | HR 76 | Resp 14 | Ht 65.5 in | Wt 128.0 lb

## 2016-10-25 DIAGNOSIS — M47816 Spondylosis without myelopathy or radiculopathy, lumbar region: Secondary | ICD-10-CM

## 2016-10-25 DIAGNOSIS — M791 Myalgia: Secondary | ICD-10-CM

## 2016-10-25 DIAGNOSIS — Q796 Ehlers-Danlos syndrome: Secondary | ICD-10-CM

## 2016-10-25 DIAGNOSIS — Q7962 Hypermobile Ehlers-Danlos syndrome: Secondary | ICD-10-CM

## 2016-10-25 DIAGNOSIS — M170 Bilateral primary osteoarthritis of knee: Secondary | ICD-10-CM

## 2016-10-25 DIAGNOSIS — M5030 Other cervical disc degeneration, unspecified cervical region: Secondary | ICD-10-CM | POA: Diagnosis not present

## 2016-10-25 DIAGNOSIS — M5134 Other intervertebral disc degeneration, thoracic region: Secondary | ICD-10-CM

## 2016-10-25 DIAGNOSIS — M7918 Myalgia, other site: Secondary | ICD-10-CM

## 2016-10-25 DIAGNOSIS — Z96652 Presence of left artificial knee joint: Secondary | ICD-10-CM

## 2016-10-25 DIAGNOSIS — R5383 Other fatigue: Secondary | ICD-10-CM

## 2016-10-25 DIAGNOSIS — B370 Candidal stomatitis: Secondary | ICD-10-CM

## 2016-10-25 DIAGNOSIS — Z5181 Encounter for therapeutic drug level monitoring: Secondary | ICD-10-CM | POA: Diagnosis not present

## 2016-10-25 DIAGNOSIS — M3500 Sicca syndrome, unspecified: Secondary | ICD-10-CM | POA: Diagnosis not present

## 2016-10-25 DIAGNOSIS — M47812 Spondylosis without myelopathy or radiculopathy, cervical region: Secondary | ICD-10-CM

## 2016-10-25 DIAGNOSIS — M19041 Primary osteoarthritis, right hand: Secondary | ICD-10-CM

## 2016-10-25 DIAGNOSIS — R011 Cardiac murmur, unspecified: Secondary | ICD-10-CM | POA: Diagnosis not present

## 2016-10-25 DIAGNOSIS — F5101 Primary insomnia: Secondary | ICD-10-CM | POA: Diagnosis not present

## 2016-10-25 DIAGNOSIS — M19042 Primary osteoarthritis, left hand: Secondary | ICD-10-CM

## 2016-10-25 LAB — CBC WITH DIFFERENTIAL/PLATELET
Basophils Absolute: 0 cells/uL (ref 0–200)
Basophils Relative: 0 %
Eosinophils Absolute: 43 cells/uL (ref 15–500)
Eosinophils Relative: 1 %
HCT: 37.5 % (ref 35.0–45.0)
Hemoglobin: 12.8 g/dL (ref 11.7–15.5)
Lymphocytes Relative: 28 %
Lymphs Abs: 1204 cells/uL (ref 850–3900)
MCH: 29 pg (ref 27.0–33.0)
MCHC: 34.1 g/dL (ref 32.0–36.0)
MCV: 84.8 fL (ref 80.0–100.0)
MPV: 9.4 fL (ref 7.5–12.5)
Monocytes Absolute: 344 cells/uL (ref 200–950)
Monocytes Relative: 8 %
Neutro Abs: 2709 cells/uL (ref 1500–7800)
Neutrophils Relative %: 63 %
Platelets: 185 10*3/uL (ref 140–400)
RBC: 4.42 MIL/uL (ref 3.80–5.10)
RDW: 13.5 % (ref 11.0–15.0)
WBC: 4.3 10*3/uL (ref 3.8–10.8)

## 2016-10-25 LAB — COMPLETE METABOLIC PANEL WITH GFR
ALT: 8 U/L (ref 6–29)
AST: 12 U/L (ref 10–35)
Albumin: 4.5 g/dL (ref 3.6–5.1)
Alkaline Phosphatase: 56 U/L (ref 33–130)
BUN: 19 mg/dL (ref 7–25)
CO2: 28 mmol/L (ref 20–31)
Calcium: 9.9 mg/dL (ref 8.6–10.4)
Chloride: 104 mmol/L (ref 98–110)
Creat: 0.82 mg/dL (ref 0.50–1.05)
GFR, Est African American: >89 mL/min (ref >=60)
GFR, Est Non African American: 80 mL/min (ref >=60)
Glucose, Bld: 86 mg/dL (ref 65–99)
Potassium: 4.3 mmol/L (ref 3.5–5.3)
Sodium: 142 mmol/L (ref 135–146)
Total Bilirubin: 0.4 mg/dL (ref 0.2–1.2)
Total Protein: 6.5 g/dL (ref 6.1–8.1)

## 2016-10-25 MED ORDER — FLUCONAZOLE 150 MG PO TABS: 150.0000 mg | ORAL_TABLET | ORAL | 0 refills | Status: DC

## 2016-10-25 NOTE — Patient Instructions (Signed)
Supplements for OA Natural anti-inflammatories  You can purchase these at [employer], [employer] or online.  . Turmeric (capsules)  . Ginger (ginger root or capsules)  . Omega 3 (Fish, flax seeds, chia seeds, walnuts, almonds)  . Tart cherry (dried or extract)   Patient should be under the care of a physician while taking these supplements. This may not be reproduced without the permission of Dr. Caleyah Jr.  

## 2016-10-26 ENCOUNTER — Telehealth: Payer: Self-pay | Admitting: Radiology

## 2016-10-26 NOTE — Telephone Encounter (Signed)
I have called patient to advise labs are normal  

## 2016-10-26 NOTE — Progress Notes (Signed)
WNL

## 2016-10-26 NOTE — Telephone Encounter (Signed)
-----   Message from Shaili Deveshwar, MD sent at 10/26/2016  9:01 AM EDT ----- WNL 

## 2016-11-17 DIAGNOSIS — Z23 Encounter for immunization: Secondary | ICD-10-CM | POA: Diagnosis not present

## 2016-11-21 ENCOUNTER — Ambulatory Visit
Admission: RE | Admit: 2016-11-21 | Discharge: 2016-11-21 | Disposition: A | Discharge status: Home / Self Care | Payer: BLUE CROSS/BLUE SHIELD | Source: Ambulatory Visit | Attending: Obstetrics and Gynecology | Admitting: Obstetrics and Gynecology

## 2016-11-21 DIAGNOSIS — Z1231 Encounter for screening mammogram for malignant neoplasm of breast: Secondary | ICD-10-CM | POA: Diagnosis not present

## 2016-11-27 ENCOUNTER — Ambulatory Visit (INDEPENDENT_AMBULATORY_CARE_PROVIDER_SITE_OTHER): Payer: BLUE CROSS/BLUE SHIELD | Admitting: Sports Medicine

## 2016-11-27 ENCOUNTER — Ambulatory Visit: Payer: Self-pay

## 2016-11-27 ENCOUNTER — Encounter: Payer: Self-pay | Admitting: Sports Medicine

## 2016-11-27 VITALS — BP 125/68 | Ht 65.5 in | Wt 128.0 lb

## 2016-11-27 DIAGNOSIS — M25512 Pain in left shoulder: Secondary | ICD-10-CM | POA: Insufficient documentation

## 2016-11-27 NOTE — Progress Notes (Signed)
Subjective:     Patient ID: Denzil HughesJulia G Tull, female   DOB: 1961/05/27, 56 y.o.   MRN: [id number]  HPI Patient is a 56 year old female with pmh significant for EDS and spinal fusions throughout spine who presents with pain in L shoulder x 1 month. She does not have pain at rest, but does have pain with overhead motions. She does not remember an inciting event recently but recalls a fall on her left shoulder 20 years ago. This pain occasionaly radiates down patient's left arm to radial side of wrist. She has trouble sleeping on that side and uses pillow at night to elevate arm. She has tried icing which has not helped and meloxicam 1x/night which has helped to take the edge off.  Review of Systems + radiating pain - swelling - numbness/tingling - weakness    Objective:   Physical Exam  BP 125/68   Ht 5' 5.5" (1.664 m)   Wt 128 lb (58.1 kg)   BMI 20.98 kg/m  General: Well-appearing female, sitting comfortably on exam table. A&O x3. NAD. Extremities:Warm well perfused MSK: Good ROM in cervical spine without pain. L Shoulder: Inspection reveals no abnormalities, atrophy or asymmetry. Palpation  with possible slt tenderness over AC joint. Slight TTP over bicipital groove. ROM is full in all planes with painful arc at 160 degrees flexion and at 100 degrees abduction. No drop arm sign. Rotator cuff strength normal with ER, IR, abduction. Some weakness on Speeds on L. Yergason's test normal. + empty can on L + Obrien's on L although no pain with palm up + Crossover on left - Hawkin's Neuro: Gross sensation in bilateral upper extremities preserved.  Ultrasound of left shoulder 11/27/16:  Normal rotator cuff tendons without tear or tendinopathy.  Small effusion over [facility] joint with some calcification in joint Bicipital tendon normal  Impression - ultrasound consistent with AC joint arthritis with mild effusion  Ultrasound and interpretation by Sibyl ParrKarl B. Fields, MD      Assessment:      Patient is 56 year old female s/p spinal fusion with radicular shoulder pain into hand on left. As there is radiating pain without tendinopathy on US or observably etiology on shoulder exam, the pain in shoulder and down arm may be attributed to prior cervical vertebrae fusion. There was some AC joint arthritis, evident by small effusion on US, that is Possibly  historical fall and likely causing shoulder pain with reaching movements. we will start with conservative management.    Plan:     AC joint arthritis, left: - Ice shoulder nightly, especially if painful during the day - Home exercises to strengthen rotator cuff but avoid AC impaction - abduction, ER but avoid IR and reaching across midline.  Cervical radiculopathy: - Increase robaxin to twice in evening, separated     [facility] Sheyanne Munley [employer] MS4  I personally was present and performed or re-performed the history, physical exam and medical decision-making activities of this service and have verified that the service and findings are accurately documented in the student's note. Enid BaasKarl Fields, MD

## 2016-11-27 NOTE — Assessment & Plan Note (Signed)
See conservative management plan

## 2016-11-28 ENCOUNTER — Other Ambulatory Visit: Payer: Self-pay | Admitting: Rheumatology

## 2016-11-28 DIAGNOSIS — L235 Allergic contact dermatitis due to other chemical products: Secondary | ICD-10-CM | POA: Diagnosis not present

## 2016-11-28 NOTE — Telephone Encounter (Signed)
Last Visit: 10/25/16 Next Visit: 04/25/17  Okay to refill Methocarbamol?

## 2016-11-28 NOTE — Telephone Encounter (Signed)
ok 

## 2017-01-17 ENCOUNTER — Ambulatory Visit (INDEPENDENT_AMBULATORY_CARE_PROVIDER_SITE_OTHER): Payer: BLUE CROSS/BLUE SHIELD | Admitting: Sports Medicine

## 2017-01-17 ENCOUNTER — Encounter: Payer: Self-pay | Admitting: Sports Medicine

## 2017-01-17 ENCOUNTER — Ambulatory Visit: Payer: Self-pay

## 2017-01-17 VITALS — BP 110/62 | Ht 66.0 in | Wt 128.0 lb

## 2017-01-17 DIAGNOSIS — M25462 Effusion, left knee: Secondary | ICD-10-CM

## 2017-01-17 DIAGNOSIS — M25562 Pain in left knee: Secondary | ICD-10-CM

## 2017-01-17 NOTE — Progress Notes (Signed)
Left knee pain  Patient had a fall on trip last month Landed on anterior knee She was initially bruised and swollen This extended from knee to anterior shin  This knee underwent a partial and then complet TKR  ROS Knee not unstable Feels painful with pressure Shin feels less painful but still swollen  PE Thin W F in NAD BP 110/62   Ht 5\' 6"  (1.676 m)   Wt 128 lb (58.1 kg)   BMI 20.66 kg/m   Stable knee to translation Good stability medially Lateral stress shows some laxity Puffiness above SPP Still some bruising  Some nontender swelling along anterior shin and some bruising  Ultrasound of left Knee  There is an effusion of suprapatellar pouch Superior spur at patella appears separated from patella and surrounded by hypoechoic change Some hypoechoic change along lateral and medial joint Components of Total knee appear in place  Impression - significant knee contusion and effusion post fall but stable total knee replacement  Ultrasound and interpretation by Sibyl ParrKarl B. Darrick PennaFields, MD

## 2017-01-17 NOTE — Assessment & Plan Note (Signed)
Should use compression Use some icing Do standing SLR/ LLR/ SL extension (pain with lying from back fusion)  Recheck if not resolving over next month

## 2017-01-21 DIAGNOSIS — L738 Other specified follicular disorders: Secondary | ICD-10-CM | POA: Diagnosis not present

## 2017-01-21 DIAGNOSIS — L814 Other melanin hyperpigmentation: Secondary | ICD-10-CM | POA: Diagnosis not present

## 2017-01-21 DIAGNOSIS — D485 Neoplasm of uncertain behavior of skin: Secondary | ICD-10-CM | POA: Diagnosis not present

## 2017-01-21 DIAGNOSIS — L720 Epidermal cyst: Secondary | ICD-10-CM | POA: Diagnosis not present

## 2017-01-24 ENCOUNTER — Ambulatory Visit: Payer: BLUE CROSS/BLUE SHIELD | Admitting: Sports Medicine

## 2017-02-01 DIAGNOSIS — Z23 Encounter for immunization: Secondary | ICD-10-CM | POA: Diagnosis not present

## 2017-02-19 ENCOUNTER — Ambulatory Visit (INDEPENDENT_AMBULATORY_CARE_PROVIDER_SITE_OTHER): Payer: BLUE CROSS/BLUE SHIELD | Admitting: Sports Medicine

## 2017-02-19 ENCOUNTER — Encounter: Payer: Self-pay | Admitting: Sports Medicine

## 2017-02-19 DIAGNOSIS — M25462 Effusion, left knee: Secondary | ICD-10-CM

## 2017-02-19 DIAGNOSIS — M25512 Pain in left shoulder: Secondary | ICD-10-CM | POA: Diagnosis not present

## 2017-02-19 NOTE — Assessment & Plan Note (Signed)
Sxs today seem more related to labrum  Known pathology here before  Start concentric exercise program for IR  Reck 2 mos

## 2017-02-19 NOTE — Patient Instructions (Signed)
Shoulder exercises: Low flys: Elbows down at sides with 90 degree bend, palms facing each other holding weight.  Bring weight lateral (no further than 45 degrees), and then bring palms together.  Repeat. High Flys: Arms up in front of face, elbows bent 90 degrees, palms facing each other holding weight.  Bring weight lateral (no further than 45 degrees), and then bring palms together.  Repeat. Straight arm raises: Arms straight down at sides holding weight with palms facing upward, raise to eye level.  Return down to side.  Repeat.

## 2017-02-19 NOTE — Progress Notes (Signed)
Left knee pain f/u and left shoulder pain f/u  Seen in clinic on 01/17/17 for left knee pain 2/2 fall and diagnosed with significant knee contusion and effusion with stable TKR Pain and bruising improved, however feels swelling is unchanged  Seen in clinic on 11/27/16 for left shoulder pain and diagnosed with left AC joint arthritis Currently having difficulty lifting arm straight up while laying in bed Shoulder pain present for past several years   ROS Knee not unstable Denies falls  PE Thin W F in NAD BP 120/62   Ht 5\' 6"  (1.676 m)   Wt 128 lb (58.1 kg)   BMI 20.66 kg/m   Left knee Stable knee to translation No joint line tenderness, no patellar tenderness Puffiness above SPP Bruising resolved  L Shoulder: Inspection reveals no abnormalities, atrophy or asymmetry. No TTP at [facility] joint or bicipital groove. ROM is full in all planes. Strength 5/5 in all planes.  Speeds and Yergason's test normal. Negative empty can, Hawkin's. Negative Obrien's but pain with other labral testing + stabilization test while laying down Neuro: Gross sensation in bilateral upper extremities preserved.   A/P Left knee contusion and effusion follow up, improving - Continue compression wrap  Left shoulder chronic labral pathology - HEP given today  Follow up in 1 week for orthotics which have worn out over several years

## 2017-02-19 NOTE — Assessment & Plan Note (Signed)
This is slowly improving  We reviewed HEP and use of compresion  Repeat US if persists

## 2017-02-27 ENCOUNTER — Ambulatory Visit (INDEPENDENT_AMBULATORY_CARE_PROVIDER_SITE_OTHER): Payer: BLUE CROSS/BLUE SHIELD | Admitting: Family Medicine

## 2017-02-27 VITALS — BP 118/74 | Ht 66.0 in | Wt 128.0 lb

## 2017-02-27 DIAGNOSIS — R269 Unspecified abnormalities of gait and mobility: Secondary | ICD-10-CM

## 2017-02-27 DIAGNOSIS — M216X1 Other acquired deformities of right foot: Secondary | ICD-10-CM

## 2017-02-27 DIAGNOSIS — M216X2 Other acquired deformities of left foot: Secondary | ICD-10-CM | POA: Diagnosis not present

## 2017-02-27 NOTE — Progress Notes (Signed)
Chief complaint: In need of orthotics today  History of present illness: Nicole Oneill is a 56 year old female who presents to the sports medicine office today, does request new orthotics today. She does not recall the last time that she had orthotics, does report that these types of orthotics helps, would like to have new ones today that are the same as the other ones. 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 In her right orthotics she has had medial heel wedge as well as first ray post in her right orthotic, but nothing in her left orthotic. Of note, she has had a history of spinal fusion, from C4-C7 and also fusion from T2-S1. She does have issues with pronation. She has had chronic issues with her right foot as well as her lower back.  Past medical history, surgical history, family history, and social history reviewed, unchanged from last office visit, pertinent history noted above in history of present illness.  Review of systems:  As stated above  Physical exam: Vital signs are reviewed and are documented in the chart Gen.: Alert, oriented, appears stated age, in no apparent distress HEENT: Moist oral mucosa Respiratory: Normal respirations, able to speak in full sentences Cardiac: Regular rate, distal pulses 2+ Integumentary: No rashes on visible skin:  Neurologic: No focal deficits noted Psych: Normal affect, mood is described as good Musculoskeletal: On gait evaluation she does have pronation of both of her feet, more evident in her right foot and ankle, strikes and a more extended position, where her right leg and foot are in extension  Assessment and plan: 1. Gait abnormality 2. B/L overpronation 3. History of Ehlers-Danlos Type III  Orthotic Fitting and Adjustment note: Patient was fitted for a : standard, cushioned, semi-rigid orthotic.  The orthotic was heated and afterward the patient stood on the orthotic blank positioned on the  orthotic stand.  The patient was positioned in subtalar neutral position and 10 degrees of ankle dorsiflexion in a weight bearing stance.  After completion of molding, a stable base was applied to the orthotic blank.  The blank was ground to a stable position for weight bearing.  Size: 8 Base: Blue EVA Posting: 1st ray post on right orthotic Additional orthotic padding: Medial heel wedge on right orthotic  Greater than 50% of the patient's visit for a total of 30 minutes, was spent conducting face-to-face counseling for bilateral foot orthotics, fitting, and constructing of the orthotics.  She does have planned follow up for her left shoulder pain with Dr. Darrick Penna in the upcoming week or so.  Haynes Kerns, M.D. Primary Care Sports Medicine Fellow [facility]

## 2017-02-27 NOTE — Patient Instructions (Signed)
Please let us know if your orthotics become uncomfortable or you have any other concerns.

## 2017-03-05 ENCOUNTER — Other Ambulatory Visit: Payer: Self-pay | Admitting: Rheumatology

## 2017-03-06 NOTE — Telephone Encounter (Signed)
Last Visit: 10/25/16 Next Visit: 04/25/17  Okay to refill per Dr. Corliss Skainseveshwar

## 2017-03-28 DIAGNOSIS — Z6821 Body mass index (BMI) 21.0-21.9, adult: Secondary | ICD-10-CM | POA: Diagnosis not present

## 2017-03-28 DIAGNOSIS — H6123 Impacted cerumen, bilateral: Secondary | ICD-10-CM | POA: Diagnosis not present

## 2017-04-08 ENCOUNTER — Other Ambulatory Visit: Payer: Self-pay | Admitting: Rheumatology

## 2017-04-08 NOTE — Telephone Encounter (Signed)
Last Visit: 10/25/16 Next Visit: 04/25/17  Okay to refill Ambien?

## 2017-04-08 NOTE — Telephone Encounter (Signed)
ok 

## 2017-04-10 DIAGNOSIS — R42 Dizziness and giddiness: Secondary | ICD-10-CM | POA: Diagnosis not present

## 2017-04-10 DIAGNOSIS — Z01419 Encounter for gynecological examination (general) (routine) without abnormal findings: Secondary | ICD-10-CM | POA: Diagnosis not present

## 2017-04-15 NOTE — Progress Notes (Signed)
Office Visit Note  Patient: Nicole Oneill             Date of Birth: 1960/10/15           MRN: [id number]             PCP: [name], MD Referring: [name], MD Visit Date: 04/25/2017 Occupation: @    Subjective:  Left shoulder pain.   History of Present Illness: Nicole Oneill is a 56 y.o. female with history of osteoarthritis and disc disease. She continues to have pain and discomfort in her joints. She states she's been having increased pain in her left shoulder. She was seen by Dr. Darrick Penna who felt that she has lost some cartilage in her left shoulder per patient. In June she fell and acquired hairline fracture in her tibia. She is wearing a sleeve brace.  Activities of Daily Living:  Patient reports morning stiffness for 0 minutes.   Patient Denies nocturnal pain.  Difficulty dressing/grooming: Denies Difficulty climbing stairs: Denies Difficulty getting out of chair: Denies Difficulty using hands for taps, buttons, cutlery, and/or writing: Reports   Review of Systems  Constitutional: Positive for fatigue. Negative for night sweats, weight gain, weight loss and weakness.  HENT: Positive for mouth dryness. Negative for mouth sores, trouble swallowing, trouble swallowing and nose dryness.   Eyes: Positive for dryness. Negative for pain, redness and visual disturbance.  Respiratory: Negative.  Negative for cough, shortness of breath and difficulty breathing.   Cardiovascular: Negative.  Negative for chest pain, palpitations, hypertension, irregular heartbeat and swelling in legs/feet.  Gastrointestinal: Negative.  Negative for blood in stool, constipation and diarrhea.  Endocrine: Negative for increased urination.  Genitourinary: Negative for vaginal dryness.  Musculoskeletal: Positive for arthralgias, joint pain, muscle weakness, morning stiffness and muscle tenderness. Negative for joint swelling, myalgias and myalgias.  Skin: Negative.  Negative for color change,  rash, hair loss, skin tightness, ulcers and sensitivity to sunlight.  Allergic/Immunologic: Negative for susceptible to infections.  Neurological: Negative.  Negative for dizziness, numbness, headaches, memory loss and night sweats.  Hematological: Negative for swollen glands.  Psychiatric/Behavioral: Positive for sleep disturbance. Negative for depressed mood. The patient is not nervous/anxious.     PMFS History:  Patient Active Problem List   Diagnosis Date Noted  . Left anterior shoulder pain 11/27/2016  . History of lumbar fusion 10/16/2016  . History of fusion of cervical spine 10/16/2016  . History of total knee arthroplasty, left 2012 10/16/2016  . Other fatigue 10/16/2016  . Primary insomnia 10/16/2016  . DDD lumbar 10/15/2016  . DJD (degenerative joint disease), cervical 10/15/2016  . DDD (degenerative disc disease), thoracic 10/15/2016  . Primary osteoarthritis of both knees 10/15/2016  . Sjogren's syndrome with keratoconjunctivitis sicca (HCC) 10/15/2016  . Tendinopathy of right gluteus medius 05/29/2016  . Chronic lumbar radiculopathy 03/08/2016  . Heart murmur, systolic 06/19/2014  . Disorder of SI (sacroiliac) joint 06/02/2014  . Functional gait abnormality 06/02/2014  . Ehlers-Danlos syndrome type III 03/10/2014  . Foot arch pain 03/10/2014  . Labral tear of shoulder 05/26/2013  . Left knee pain 05/26/2013  . 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 Effusion of knee joint, left 08/05/2012  . Plantar fasciitis 02/05/2011  . Lateral epicondylitis of right elbow 10/12/2010  . Osteoarthritis of multiple joints 10/12/2010    Past Medical History:  Diagnosis Date  . Anemia   . Arthritis    osteo  . Complication of anesthesia    urinary retention requiring indwelling  foley catheterization  . DDD (degenerative disc disease)   . Ehlers-Danlos syndrome type III   . Fibromyalgia   . History of blood transfusion   . Osteoarthritis     Family History  Problem Relation Age of  Onset  . Stroke Mother        hemorrhagic  . Hypertension Father   . Cancer - Prostate Father   . Anorexia nervosa Sister   . Heart attack Maternal Grandmother   . Stroke Maternal Grandfather   . Cancer - Colon Paternal Grandmother   . Cancer - Colon Paternal Grandfather    Past Surgical History:  Procedure Laterality Date  . BACK SURGERY     Cervical fusion C5-7, fusion  T10-S1  . DIAGNOSTIC LAPAROSCOPY     x 3  . EYE SURGERY     bilateral tear duct fusion  . JOINT REPLACEMENT     partial left knee 2005, total left knee 2012  . KNEE ARTHROSCOPY  08/06/2012   Procedure: ARTHROSCOPY KNEE;  Surgeon: Loanne Drilling, MD;  Location: [facility];  Service: Orthopedics;  Laterality: Left;  WITH SYNOVECTOMY and debridement  . OSTEOTOMY     bilateral tibial   Social History   Social History Narrative   Patient lives at home with family.   Caffeine Use: 4-5 sodas weekly     Objective: Vital Signs: BP 109/72 (BP Location: Left Arm, Patient Position: Sitting, Cuff Size: Normal)   Pulse 67   Ht  (1.676 m)   Wt 137 lb (62.1 kg)   BMI 22.11 kg/m    Physical Exam  Constitutional: She is oriented to person, place, and time. She appears well-developed and well-nourished.  HENT:  Head: Normocephalic and atraumatic.  Eyes: Conjunctivae and EOM are normal.  Neck: Normal range of motion.  Cardiovascular: Normal rate, regular rhythm, normal heart sounds and intact distal pulses.   Pulmonary/Chest: Effort normal and breath sounds normal.  Abdominal: Soft. Bowel sounds are normal.  Lymphadenopathy:    She has no cervical adenopathy.  Neurological: She is alert and oriented to person, place, and time.  Skin: Skin is warm and dry. Capillary refill takes less than 2 seconds.  Psychiatric: She has a normal mood and affect. Her behavior is normal.  Nursing note and vitals reviewed.    Musculoskeletal Exam: C-spine and thoracic lumbar spine limited range of motion. Shoulder joints elbow  joints wrist joints with good range of motion. She has bilateral CMC PIP/DIP and DIP prominence consistent with osteoarthritis. Her left total knee replacement is doing well. She had recent fracture and is in a sleeve brace. She does have some osteoarthritic changes in her feet.  CDAI Exam: No CDAI exam completed.    Investigation: No additional findings. CBC Latest Ref Rng & Units 10/25/2016 07/29/2012 05/05/2011  WBC 3.8 - 10.8 K/uL 4.3 4.1 7.2  Hemoglobin 11.7 - 15.5 g/dL 16.1 09.6 0.4(V)  Hematocrit 35.0 - 45.0 % 37.5 36.8 26.0(L)  Platelets 140 - 400 K/uL 185 189 157   CMP Latest Ref Rng & Units 10/25/2016 05/04/2011 05/03/2011  Glucose 65 - 99 mg/dL 86 409(W) 119(J)  BUN 7 - 25 mg/dL Creatinine 0.50 - 1.05 mg/dL 4.78 2.95 6.21  Sodium 135 - 146 mmol/L 142 137 137  Potassium 3.5 - 5.3 mmol/L 4.3 3.4(L) 4.0  Chloride 98 - 110 mmol/L 104 100 99  CO2 20 - 31 mmol/L 28 32 33(H)  Calcium 8.6 - 10.4 mg/dL 9.9 8.4 8.8  Total Protein 6.1 - 8.1 g/dL 6.5 - -  Total Bilirubin 0.2 - 1.2 mg/dL 0.4 - -  Alkaline Phos 33 - 130 U/L 56 - -  AST 10 - 35 U/L 12 - -  ALT 6 - 29 U/L 8 - -    Imaging: No results found.  Speciality Comments: No specialty comments available.    Procedures:  No procedures performed Allergies: Patient has no known allergies.   Assessment / Plan:     Visit Diagnoses: DDD (degenerative disc disease), lumbar - Status post fusion. She has limited range of motion.  Osteoarthritis of cervical spine, unspecified spinal osteoarthritis complication status - Status post fusion. She has limited range of motion some discomfort.  DDD (degenerative disc disease), thoracic - Status post fusion: She continues to have some chronic pain.  Primary osteoarthritis of both hands - She has bilateral severe CMC arthritis joint protection was discussed. She is on long-term Mobic. I will check her labs today CBC and CMP. We also discussed switching from Mobic to Tylenol if  possible that she's been having some gastritis symptoms.  Primary osteoarthritis of both knees - Left total knee replacement 2012: Doing well except for some discomfort due to recent fracture.  Sicca, unspecified type [facility]): Over-the-counter products were discussed. I also discussed the option of pilocarpine but she declined.  Ehlers-Danlos syndrome type III - Followed up by Dr. Darrick Penna. She has Ehlers-Danlos syndrome and hypermobility, which makes her more prone towards osteoarthritis also  Heart murmur, systolic  Other fatigue: Secondary to insomnia.  Primary insomnia: She is on Ambien.     Orders: Orders Placed This Encounter  Procedures  . CBC with Differential/Platelet  . COMPLETE METABOLIC PANEL WITH GFR   No orders of the defined types were placed in this encounter.    Follow-Up Instructions: Return in about 6 months (around 10/24/2017) for Osteoarthritis, Sicca.   Pollyann Savoy, MD  Note - This record has been created using Animal nutritionist.  Chart creation errors have been sought, but may not always  have been located. Such creation errors do not reflect on  the standard of medical care.

## 2017-04-22 DIAGNOSIS — Z23 Encounter for immunization: Secondary | ICD-10-CM | POA: Diagnosis not present

## 2017-04-25 ENCOUNTER — Encounter: Payer: Self-pay | Admitting: Rheumatology

## 2017-04-25 ENCOUNTER — Ambulatory Visit (INDEPENDENT_AMBULATORY_CARE_PROVIDER_SITE_OTHER): Payer: BLUE CROSS/BLUE SHIELD | Admitting: Rheumatology

## 2017-04-25 VITALS — BP 109/72 | HR 67 | Ht 66.0 in | Wt 137.0 lb

## 2017-04-25 DIAGNOSIS — M19042 Primary osteoarthritis, left hand: Secondary | ICD-10-CM

## 2017-04-25 DIAGNOSIS — R5383 Other fatigue: Secondary | ICD-10-CM | POA: Diagnosis not present

## 2017-04-25 DIAGNOSIS — M170 Bilateral primary osteoarthritis of knee: Secondary | ICD-10-CM

## 2017-04-25 DIAGNOSIS — M3500 Sicca syndrome, unspecified: Secondary | ICD-10-CM

## 2017-04-25 DIAGNOSIS — Q7962 Hypermobile Ehlers-Danlos syndrome: Secondary | ICD-10-CM

## 2017-04-25 DIAGNOSIS — F5101 Primary insomnia: Secondary | ICD-10-CM

## 2017-04-25 DIAGNOSIS — M19041 Primary osteoarthritis, right hand: Secondary | ICD-10-CM | POA: Diagnosis not present

## 2017-04-25 DIAGNOSIS — R011 Cardiac murmur, unspecified: Secondary | ICD-10-CM | POA: Diagnosis not present

## 2017-04-25 DIAGNOSIS — M5136 Other intervertebral disc degeneration, lumbar region: Secondary | ICD-10-CM

## 2017-04-25 DIAGNOSIS — M5134 Other intervertebral disc degeneration, thoracic region: Secondary | ICD-10-CM

## 2017-04-25 DIAGNOSIS — Q796 Ehlers-Danlos syndrome: Secondary | ICD-10-CM

## 2017-04-25 DIAGNOSIS — Z79899 Other long term (current) drug therapy: Secondary | ICD-10-CM

## 2017-04-25 DIAGNOSIS — M47812 Spondylosis without myelopathy or radiculopathy, cervical region: Secondary | ICD-10-CM | POA: Diagnosis not present

## 2017-04-25 LAB — COMPLETE METABOLIC PANEL WITH GFR
AG Ratio: 2.3 (calc) (ref 1.0–2.5)
ALT: 7 U/L (ref 6–29)
AST: 11 U/L (ref 10–35)
Albumin: 4.6 g/dL (ref 3.6–5.1)
Alkaline phosphatase (APISO): 58 U/L (ref 33–130)
BUN: 18 mg/dL (ref 7–25)
CO2: 31 mmol/L (ref 20–32)
Calcium: 9.5 mg/dL (ref 8.6–10.4)
Chloride: 104 mmol/L (ref 98–110)
Creat: 0.91 mg/dL (ref 0.50–1.05)
GFR, Est African American: 81 mL/min/{1.73_m2} (ref >=60)
GFR, Est Non African American: 70 mL/min/{1.73_m2} (ref >=60)
Globulin: 2 g/dL (calc) (ref 1.9–3.7)
Glucose, Bld: 75 mg/dL (ref 65–99)
Potassium: 5.2 mmol/L (ref 3.5–5.3)
Sodium: 141 mmol/L (ref 135–146)
Total Bilirubin: 0.4 mg/dL (ref 0.2–1.2)
Total Protein: 6.6 g/dL (ref 6.1–8.1)

## 2017-04-25 LAB — CBC WITH DIFFERENTIAL/PLATELET
Basophils Absolute: 22 cells/uL (ref 0–200)
Basophils Relative: 0.5 %
Eosinophils Absolute: 60 cells/uL (ref 15–500)
Eosinophils Relative: 1.4 %
HCT: 38.6 % (ref 35.0–45.0)
Hemoglobin: 12.9 g/dL (ref 11.7–15.5)
Lymphs Abs: 1161 cells/uL (ref 850–3900)
MCH: 28.2 pg (ref 27.0–33.0)
MCHC: 33.4 g/dL (ref 32.0–36.0)
MCV: 84.3 fL (ref 80.0–100.0)
MPV: 10 fL (ref 7.5–12.5)
Monocytes Relative: 8.1 %
Neutro Abs: 2709 cells/uL (ref 1500–7800)
Neutrophils Relative %: 63 %
Platelets: 196 10*3/uL (ref 140–400)
RBC: 4.58 10*6/uL (ref 3.80–5.10)
RDW: 12.7 % (ref 11.0–15.0)
Total Lymphocyte: 27 %
WBC mixed population: 348 cells/uL (ref 200–950)
WBC: 4.3 10*3/uL (ref 3.8–10.8)

## 2017-04-25 NOTE — Progress Notes (Signed)
Within normal limits

## 2017-05-02 ENCOUNTER — Ambulatory Visit: Payer: Self-pay

## 2017-05-02 ENCOUNTER — Ambulatory Visit (INDEPENDENT_AMBULATORY_CARE_PROVIDER_SITE_OTHER): Payer: BLUE CROSS/BLUE SHIELD | Admitting: Sports Medicine

## 2017-05-02 VITALS — BP 108/70 | Ht 66.0 in | Wt 125.0 lb

## 2017-05-02 DIAGNOSIS — M25462 Effusion, left knee: Secondary | ICD-10-CM

## 2017-05-02 DIAGNOSIS — M25562 Pain in left knee: Secondary | ICD-10-CM

## 2017-05-02 NOTE — Assessment & Plan Note (Signed)
Effusion is smaller However, still fairly loose on AP translation Keep using body helix Work on static strength  We will follow ion 3 mos

## 2017-05-02 NOTE — Progress Notes (Signed)
   Subjective:    Patient ID: Nicole HughesJulia G Oneill, female    DOB: [birth year], 56 y.o.   MRN: [id number]  HPI Nicole HughesJulia G Corkery "Nicole FanningJulie" presents today in follow up of L knee feelings of tightness and instability. Larey SeatFell this summer prior to last visit and found to have small bone chip above patellar installation of TKR. Treated with conservative therapy and body helix sleeve. She denies pain, reports no issues if wearing the sleeve almost constantly. Reports feelings of instability and possible swelling after removing sleeve for even one hour. Hx notable for Ehlers-Danlos with multiple joint involvement. Does take daily mobic.    Review of Systems Swelling left knee that increases by evening No low back pain this past month No current sciatica     Objective:   Physical Exam BP 108/70   Ht 5\' 6"  (1.676 m)   Wt 125 lb (56.7 kg)   BMI 20.18 kg/m   Thin F in NAD  L knee: well healed surgical scar, no skin changes or bruising. No effusion palpable on exam. Laxity of knee to Lachman.and anterior drawer   Bedside US L knee with resolution of hypoechoic change around  bone spur seen on previous imaging, decreased effusion from previous US in suprapatellar pouch. Some effusion near medial compartment of TKR.      Assessment & Plan:  L knee instability: continue body helix. Start intermittent icing program 5 min q4 hrs x 1 month. Decrease mobic to QOD. HEP including straight leg raise, lateral leg raise, and inner thigh lying leg lift.   Loni MuseKate Timberlake, MD   I observed and examined the patient with the resident and agree with assessment and plan.  Note reviewed and modified by me. Enid BaasKarl Antavious Spanos, MD

## 2017-05-28 DIAGNOSIS — M546 Pain in thoracic spine: Secondary | ICD-10-CM | POA: Diagnosis not present

## 2017-05-28 DIAGNOSIS — Z6820 Body mass index (BMI) 20.0-20.9, adult: Secondary | ICD-10-CM | POA: Diagnosis not present

## 2017-06-04 ENCOUNTER — Other Ambulatory Visit: Payer: Self-pay | Admitting: *Deleted

## 2017-06-04 MED ORDER — GABAPENTIN 300 MG PO CAPS: 300.0000 mg | ORAL_CAPSULE | Freq: Three times a day (TID) | ORAL | 2 refills | Status: DC

## 2017-06-18 ENCOUNTER — Ambulatory Visit: Payer: BLUE CROSS/BLUE SHIELD | Admitting: Sports Medicine

## 2017-06-27 ENCOUNTER — Ambulatory Visit (INDEPENDENT_AMBULATORY_CARE_PROVIDER_SITE_OTHER): Payer: BLUE CROSS/BLUE SHIELD | Admitting: Sports Medicine

## 2017-06-27 ENCOUNTER — Encounter: Payer: Self-pay | Admitting: Sports Medicine

## 2017-06-27 DIAGNOSIS — M25562 Pain in left knee: Secondary | ICD-10-CM

## 2017-06-27 DIAGNOSIS — M25462 Effusion, left knee: Secondary | ICD-10-CM | POA: Diagnosis not present

## 2017-06-27 NOTE — Assessment & Plan Note (Signed)
L knee instability, improved with good ROM and less laxity/edema compared to previous visit. Doing well with straight leg and lateral leg raises at home but will ad more hip abductor exercises today. Patient given catalog to explore other more breathable options for knee compression sleeve.

## 2017-06-27 NOTE — Assessment & Plan Note (Signed)
This seems less today I think TKR is OK Cont to do rehab

## 2017-06-27 NOTE — Progress Notes (Signed)
   HPI  CC: L knee pain follow up  Patient is a 56 yo woman with Ehlers-Danlos who is here for follow up of L knee. She had has L knee pain since having a fall over the summer and was found to have a small bone chip above patellar installation of TKR at that time. She has been doing well overall with conservative therapy and compression sleeve but notes no improvement since last visit on 05/02/17. She has been wearing the body helix sleeve constantly but thinks it is now starting to give her a rash behind her knee. If she does not wear the body helix sleeve then her pain and swelling worsens. No numbness or tingling or weakness. Has been doing some home exercises including straight leg and lateral leg raises.   ROS No instability No giving way See HPI and/or previous note for associated ROS.  Objective: BP 126/64   Ht 5\' 5"  (1.651 m)   Wt 125 lb (56.7 kg)   BMI 20.80 kg/m  Gen: Thin woman, NAD, well groomed, a/o x3, normal affect.  CV: Well-perfused. Warm.  Resp: Non-labored.  L knee: Well healed faint scar. Slight edema without effusion. ROM in flexion (145 degrees) and extension (2 degrees). Positive translation with some clicking. Slight lateral laxity but no medial knee laxity. A-P has 5-1096mm of laxity compared to 2-633mm on R knee.   Assessment and plan:  Left knee pain L knee instability, improved with good ROM and less laxity/edema compared to previous visit. Doing well with straight leg and lateral leg raises at home but will ad more hip abductor exercises today. Patient given catalog to explore other more breathable options for knee compression sleeve.   Leland HerElsia J Yoo, DO PGY-2, [facility] 06/27/2017 1:02 PM   I observed and examined the patient with the resident and agree with assessment and plan.  Note reviewed and modified by me. Enid BaasKarl Elih Mooney, MD

## 2017-07-06 ENCOUNTER — Other Ambulatory Visit: Payer: Self-pay | Admitting: Rheumatology

## 2017-07-08 ENCOUNTER — Telehealth: Payer: Self-pay | Admitting: Rheumatology

## 2017-07-08 NOTE — Telephone Encounter (Signed)
Last Visit: 04/25/17 Next Visit due in April 2019.  Labs: 04/25/17 WNL  Okay to refill Ambien, Methocarbamol and Meloxicam?

## 2017-07-08 NOTE — Telephone Encounter (Signed)
ok 

## 2017-07-08 NOTE — Telephone Encounter (Signed)
Patient called to request prescription refill for Zolpidem.  [facility] [address]  CB [phone]

## 2017-07-10 NOTE — Telephone Encounter (Signed)
Prescription sent to the pharmacy on 07/08/17 

## 2017-08-09 DIAGNOSIS — Z96652 Presence of left artificial knee joint: Secondary | ICD-10-CM | POA: Diagnosis not present

## 2017-08-21 DIAGNOSIS — M25662 Stiffness of left knee, not elsewhere classified: Secondary | ICD-10-CM | POA: Diagnosis not present

## 2017-08-28 ENCOUNTER — Telehealth: Payer: Self-pay | Admitting: Rheumatology

## 2017-08-28 MED ORDER — ZOLPIDEM TARTRATE ER 12.5 MG PO TBCR: 12.5000 mg | EXTENDED_RELEASE_TABLET | Freq: Every day | ORAL | 0 refills | Status: DC

## 2017-08-28 NOTE — Telephone Encounter (Signed)
Patient called requesting prescription refill of Ambien.  [facility] [address]  Patient states that she will be in [place] when her prescription runs out.

## 2017-08-28 NOTE — Telephone Encounter (Signed)
Last Visit: 04/25/17 Next Visit due in April 2019. Message sent to the front to schedule patient.  Okay to refill Ambien?

## 2017-08-29 ENCOUNTER — Telehealth: Payer: Self-pay | Admitting: Rheumatology

## 2017-08-29 NOTE — Telephone Encounter (Signed)
-----   Message from Henriette CombsAndrea L Hatton, LPN sent at 0/[id number]/20/2019  4:07 PM EST ----- Regarding: Please schedule a follow up visit Please schedule a follow up visit. Patient is due in April 2019. Thanks!

## 2017-08-29 NOTE — Telephone Encounter (Signed)
Patient called stating that she is leaving for [place] on 09/04/17 and her prescription cannot be refilled until 3/1 so she is requesting an early refill for Ambien.  Patient states that [facility] but could not be approved for early refill until Dr. Corliss Skainseveshwar approved it.  [facility] states that [facility] told her that she had to contact the doctor's office to get the approval.

## 2017-08-29 NOTE — Telephone Encounter (Signed)
I LMOM for patient to call, and schedule rov. In April 2019.

## 2017-08-29 NOTE — Telephone Encounter (Signed)
ok 

## 2017-08-29 NOTE — Telephone Encounter (Signed)
Can we refill patient's Ambien early as she is leaving to go to [place] on 09/04/17?

## 2017-08-30 NOTE — Telephone Encounter (Signed)
Spoke with Sharyl NimrodMeredith at [facility] and advised we are approving a one time early refill due to patient travelling.

## 2017-09-02 DIAGNOSIS — M25662 Stiffness of left knee, not elsewhere classified: Secondary | ICD-10-CM | POA: Diagnosis not present

## 2017-09-16 DIAGNOSIS — M25662 Stiffness of left knee, not elsewhere classified: Secondary | ICD-10-CM | POA: Diagnosis not present

## 2017-09-18 DIAGNOSIS — G4721 Circadian rhythm sleep disorder, delayed sleep phase type: Secondary | ICD-10-CM | POA: Diagnosis not present

## 2017-09-18 DIAGNOSIS — G4719 Other hypersomnia: Secondary | ICD-10-CM | POA: Diagnosis not present

## 2017-09-20 DIAGNOSIS — H1859 Other hereditary corneal dystrophies: Secondary | ICD-10-CM | POA: Diagnosis not present

## 2017-09-20 DIAGNOSIS — D23122 Other benign neoplasm of skin of left lower eyelid, including canthus: Secondary | ICD-10-CM | POA: Diagnosis not present

## 2017-09-20 DIAGNOSIS — H5213 Myopia, bilateral: Secondary | ICD-10-CM | POA: Diagnosis not present

## 2017-10-02 NOTE — Progress Notes (Deleted)
Office Visit Note  Patient: Nicole HughesJulia G Dooner             Date of Birth: 06-May-1961           MRN: [id number]             PCP: Rodrigo RanPerini, Mark, MD Referring: Rodrigo RanPerini, Mark, MD Visit Date: 10/16/2017 Occupation: @GUAROCC @    Subjective:  No chief complaint on file.   History of Present Illness: Nicole Oneill is a 57 y.o. female ***   Activities of Daily Living:  Patient reports morning stiffness for *** {minute/hour:19697}.   Patient {ACTIONS;DENIES/REPORTS:21021675::"Denies"} nocturnal pain.  Difficulty dressing/grooming: {ACTIONS;DENIES/REPORTS:21021675::"Denies"} Difficulty climbing stairs: {ACTIONS;DENIES/REPORTS:21021675::"Denies"} Difficulty getting out of chair: {ACTIONS;DENIES/REPORTS:21021675::"Denies"} Difficulty using hands for taps, buttons, cutlery, and/or writing: {ACTIONS;DENIES/REPORTS:21021675::"Denies"}   No Rheumatology ROS completed.   PMFS History:  Patient Active Problem List   Diagnosis Date Noted  . Left anterior shoulder pain 11/27/2016  . History of lumbar fusion 10/16/2016  . History of fusion of cervical spine 10/16/2016  . History of total knee arthroplasty, left 2012 10/16/2016  . Other fatigue 10/16/2016  . Primary insomnia 10/16/2016  . DDD lumbar 10/15/2016  . DJD (degenerative joint disease), cervical 10/15/2016  . DDD (degenerative disc disease), thoracic 10/15/2016  . Primary osteoarthritis of both knees 10/15/2016  . Sjogren's syndrome with keratoconjunctivitis sicca (HCC) 10/15/2016  . Tendinopathy of right gluteus medius 05/29/2016  . Chronic lumbar radiculopathy 03/08/2016  . Heart murmur, systolic 06/19/2014  . Disorder of SI (sacroiliac) joint 06/02/2014  . Functional gait abnormality 06/02/2014  . Ehlers-Danlos syndrome type III 03/10/2014  . Foot arch pain 03/10/2014  . Labral tear of shoulder 05/26/2013  . Left knee pain 05/26/2013  . 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 Effusion of knee joint, left 08/05/2012  . Plantar fasciitis  02/05/2011  . Lateral epicondylitis of right elbow 10/12/2010  . Osteoarthritis of multiple joints 10/12/2010    Past Medical History:  Diagnosis Date  . Anemia   . Arthritis    osteo  . Complication of anesthesia    urinary retention requiring indwelling  foley catheterization  . DDD (degenerative disc disease)   . Ehlers-Danlos syndrome type III   . Fibromyalgia   . History of blood transfusion   . Osteoarthritis     Family History  Problem Relation Age of Onset  . Stroke Mother        hemorrhagic  . Hypertension Father   . Cancer - Prostate Father   . Anorexia nervosa Sister   . Heart attack Maternal Grandmother   . Stroke Maternal Grandfather   . Cancer - Colon Paternal Grandmother   . Cancer - Colon Paternal Grandfather    Past Surgical History:  Procedure Laterality Date  . BACK SURGERY     Cervical fusion C5-7, fusion  T10-S1  . DIAGNOSTIC LAPAROSCOPY     x 3  . EYE SURGERY     bilateral tear duct fusion  . JOINT REPLACEMENT     partial left knee 2005, total left knee 2012  . KNEE ARTHROSCOPY  08/06/2012   Procedure: ARTHROSCOPY KNEE;  Surgeon: Loanne DrillingFrank V Aluisio, MD;  Location: [facility];  Service: Orthopedics;  Laterality: Left;  WITH SYNOVECTOMY and debridement  . OSTEOTOMY     bilateral tibial   Social History   Social History Narrative   Patient lives at home with family.   Caffeine Use: 4-5 sodas weekly     Objective: Vital Signs: There were no vitals taken for this  visit.   Physical Exam   Musculoskeletal Exam: ***  CDAI Exam: No CDAI exam completed.    Investigation: No additional findings. CBC Latest Ref Rng & Units 04/25/2017 10/25/2016 07/29/2012  WBC 3.8 - 10.8 Thousand/uL 4.3 4.3 4.1  Hemoglobin 11.7 - 15.5 g/dL 16.1 09.6 04.5  Hematocrit 35.0 - 45.0 % 38.6 37.5 36.8  Platelets 140 - 400 Thousand/uL 196 185 189   CMP Latest Ref Rng & Units 04/25/2017 10/25/2016 05/04/2011  Glucose 65 - 99 mg/dL 75 86 409(W)  BUN 7 - 25 mg/dL 18 19 10     Creatinine 0.50 - 1.05 mg/dL 1.19 1.47 8.29  Sodium 135 - 146 mmol/L 141 142 137  Potassium 3.5 - 5.3 mmol/L 5.2 4.3 3.4(L)  Chloride 98 - 110 mmol/L 104 104 100  CO2 20 - 32 mmol/L 31 28 32  Calcium 8.6 - 10.4 mg/dL 9.5 9.9 8.4  Total Protein 6.1 - 8.1 g/dL 6.6 6.5 -  Total Bilirubin 0.2 - 1.2 mg/dL 0.4 0.4 -  Alkaline Phos 33 - 130 U/L - 56 -  AST 10 - 35 U/L 11 12 -  ALT 6 - 29 U/L 7 8 -    Imaging: No results found.  Speciality Comments: No specialty comments available.    Procedures:  No procedures performed Allergies: Patient has no known allergies.   Assessment / Plan:     Visit Diagnoses: Sjogren's syndrome with keratoconjunctivitis sicca (HCC)  DDD (degenerative disc disease), cervical - s/p fusion  DDD (degenerative disc disease), thoracic - s/p fusion  DDD (degenerative disc disease), lumbar - s/p fusion  Primary osteoarthritis of both hands  Primary osteoarthritis of right knee  History of total knee arthroplasty, left 2012  Ehlers-Danlos syndrome type III  Plantar fasciitis  Lateral epicondylitis of right elbow  Heart murmur, systolic  Other fatigue  Primary insomnia - on Ambien    Orders: No orders of the defined types were placed in this encounter.  No orders of the defined types were placed in this encounter.   Face-to-face time spent with patient was *** minutes. 50% of time was spent in counseling and coordination of care.  Follow-Up Instructions: No follow-ups on file.   Gearldine Bienenstock, PA-C  Note - This record has been created using Dragon software.  Chart creation errors have been sought, but may not always  have been located. Such creation errors do not reflect on  the standard of medical care.

## 2017-10-03 DIAGNOSIS — Z96652 Presence of left artificial knee joint: Secondary | ICD-10-CM | POA: Diagnosis not present

## 2017-10-08 DIAGNOSIS — L700 Acne vulgaris: Secondary | ICD-10-CM | POA: Diagnosis not present

## 2017-10-16 ENCOUNTER — Ambulatory Visit: Payer: BLUE CROSS/BLUE SHIELD | Admitting: Physician Assistant

## 2017-10-16 NOTE — Progress Notes (Signed)
Office Visit Note  Patient: Nicole Oneill             Date of Birth: July 29, 1960           MRN: [id number]             PCP: [name], MD Referring: [name], MD Visit Date: 10/29/2017 Occupation: @GUAROCC @    Subjective:  Pain in multiple joints.   History of Present Illness: Nicole Oneill is a 57 y.o. female with history of osteoarthritis and degenerative disc disease.  She continues to have some discomfort in her cervical, thoracic and lumbar spine.  She has been going for physical therapy and Pilates which has been helpful.  She has been having some discomfort in her right knee for which she has been going to physical therapy.  Her sicca symptoms persist for which she has been using over-the-counter products.  Her chronic insomnia has been an issue.  She states she is unable to sleep without Ambien at nighttime.  She has come off Zoloft and has been having mild anxiety.  Activities of Daily Living:  Patient reports morning stiffness for  minute.   Patient Denies nocturnal pain.  Difficulty dressing/grooming: Denies Difficulty climbing stairs: Reports Difficulty getting out of chair: Denies Difficulty using hands for taps, buttons, cutlery, and/or writing: Denies   Review of Systems  Constitutional: Positive for fatigue. Negative for night sweats, weight gain and weight loss.  HENT: Positive for mouth dryness. Negative for mouth sores, trouble swallowing, trouble swallowing and nose dryness.   Eyes: Positive for dryness. Negative for pain, redness and visual disturbance.  Respiratory: Negative for cough, shortness of breath and difficulty breathing.   Cardiovascular: Negative for chest pain, palpitations, hypertension, irregular heartbeat and swelling in legs/feet.  Gastrointestinal: Negative for blood in stool, constipation and diarrhea.  Endocrine: Negative for increased urination.  Genitourinary: Negative for vaginal dryness.  Musculoskeletal: Positive for arthralgias  and joint pain. Negative for joint swelling, myalgias, muscle weakness, morning stiffness, muscle tenderness and myalgias.  Skin: Negative for color change, rash, hair loss, skin tightness, ulcers and sensitivity to sunlight.  Allergic/Immunologic: Negative for susceptible to infections.  Neurological: Negative for dizziness, memory loss, night sweats and weakness.  Hematological: Negative for swollen glands.  Psychiatric/Behavioral: Positive for sleep disturbance. Negative for depressed mood. The patient is not nervous/anxious.     PMFS History:  Patient Active Problem List   Diagnosis Date Noted  . DDD (degenerative disc disease), lumbar 10/29/2017  . Sicca syndrome, unspecified (HCC) 10/29/2017  . Left anterior shoulder pain 11/27/2016  . History of lumbar fusion 10/16/2016  . History of fusion of cervical spine 10/16/2016  . History of total knee arthroplasty, left 2012 10/16/2016  . Other fatigue 10/16/2016  . Primary insomnia 10/16/2016  . DDD lumbar 10/15/2016  . DJD (degenerative joint disease), cervical 10/15/2016  . DDD (degenerative disc disease), thoracic 10/15/2016  . Primary osteoarthritis of both knees 10/15/2016  . Tendinopathy of right gluteus medius 05/29/2016  . Chronic lumbar radiculopathy 03/08/2016  . Heart murmur, systolic 06/19/2014  . Disorder of SI (sacroiliac) joint 06/02/2014  . Functional gait abnormality 06/02/2014  . Ehlers-Danlos syndrome type III 03/10/2014  . Foot arch pain 03/10/2014  . Labral tear of shoulder 05/26/2013  . Left knee pain 05/26/2013  . Primary osteoarthritis of both hands 05/26/2013  . Effusion of knee joint, left 08/05/2012  . Plantar fasciitis 02/05/2011  . Lateral epicondylitis of right elbow 10/12/2010  . Osteoarthritis of multiple  joints 10/12/2010    Past Medical History:  Diagnosis Date  . Anemia   . Arthritis    osteo  . Complication of anesthesia    urinary retention requiring indwelling  foley catheterization    . DDD (degenerative disc disease)   . Ehlers-Danlos syndrome type III   . Fibromyalgia   . History of blood transfusion   . Osteoarthritis     Family History  Problem Relation Age of Onset  . Stroke Mother        hemorrhagic  . Hypertension Father   . Cancer - Prostate Father   . Atrial fibrillation Father   . Congestive Heart Failure Father   . Anorexia nervosa Sister   . Heart attack Maternal Grandmother   . Stroke Maternal Grandfather   . Cancer - Colon Paternal Grandmother   . Cancer - Colon Paternal Grandfather   . Healthy Daughter   . Healthy Daughter   . Healthy Daughter    Past Surgical History:  Procedure Laterality Date  . BACK SURGERY     Cervical fusion C5-7, fusion  T10-S1  . DIAGNOSTIC LAPAROSCOPY     x 3  . EYE SURGERY     bilateral tear duct fusion  . JOINT REPLACEMENT     partial left knee 2005, total left knee 2012  . KNEE ARTHROSCOPY  08/06/2012   Procedure: ARTHROSCOPY KNEE;  Surgeon: Loanne DrillingFrank V Aluisio, MD;  Location: [facility];  Service: Orthopedics;  Laterality: Left;  WITH SYNOVECTOMY and debridement  . OSTEOTOMY     bilateral tibial   Social History   Social History Narrative   Patient lives at home with family.   Caffeine Use: 4-5 sodas weekly     Objective: Vital Signs: BP 119/72 (BP Location: Left Arm, Patient Position: Sitting, Cuff Size: Normal)   Pulse 87   Resp 15   Ht 5' 5.5" (1.664 m)   Wt 125 lb 8 oz (56.9 kg)   BMI 20.57 kg/m    Physical Exam  Constitutional: She is oriented to person, place, and time. She appears well-developed and well-nourished.  HENT:  Head: Normocephalic and atraumatic.  Eyes: Conjunctivae and EOM are normal.  Neck: Normal range of motion.  Cardiovascular: Normal rate, regular rhythm, normal heart sounds and intact distal pulses.  Pulmonary/Chest: Effort normal and breath sounds normal.  Abdominal: Soft. Bowel sounds are normal.  Lymphadenopathy:    She has no cervical adenopathy.  Neurological: She  is alert and oriented to person, place, and time.  Skin: Skin is warm and dry. Capillary refill takes less than 2 seconds.  Psychiatric: She has a normal mood and affect. Her behavior is normal.  Nursing note and vitals reviewed.    Musculoskeletal Exam: She has limited range of motion of her C-spine.  She has limited range of motion of thoracic and lumbar spine.  Shoulder joints elbow joints was transferred in good range of motion.  She has some DIP PIP thickening.  She has left total knee replacement which is doing fairly well.  She had no tender points on examination today.  CDAI Exam: No CDAI exam completed.    Investigation: No additional findings. CBC Latest Ref Rng & Units 04/25/2017 10/25/2016 07/29/2012  WBC 3.8 - 10.8 Thousand/uL 4.3 4.3 4.1  Hemoglobin 11.7 - 15.5 g/dL 04.512.9 40.912.8 81.112.3  Hematocrit 35.0 - 45.0 % 38.6 37.5 36.8  Platelets 140 - 400 Thousand/uL 196 185 189   CMP Latest Ref Rng & Units 04/25/2017 10/25/2016 05/04/2011  Glucose 65 - 99 mg/dL 75 86 161(W)  BUN 7 - 25 mg/dL 18 19 10   Creatinine 0.50 - 1.05 mg/dL 9.60 4.54 0.98  Sodium 135 - 146 mmol/L 141 142 137  Potassium 3.5 - 5.3 mmol/L 5.2 4.3 3.4(L)  Chloride 98 - 110 mmol/L 104 104 100  CO2 20 - 32 mmol/L 31 28 32  Calcium 8.6 - 10.4 mg/dL 9.5 9.9 8.4  Total Protein 6.1 - 8.1 g/dL 6.6 6.5 -  Total Bilirubin 0.2 - 1.2 mg/dL 0.4 0.4 -  Alkaline Phos 33 - 130 U/L - 56 -  AST 10 - 35 U/L 11 12 -  ALT 6 - 29 U/L 7 8 -    Imaging: No results found.  Speciality Comments: No specialty comments available.    Procedures:  No procedures performed Allergies: Patient has no known allergies.   Assessment / Plan:     Visit Diagnoses: Primary osteoarthritis of both hands-she has some stiffness in her hands which causes discomfort.  Primary osteoarthritis of both knees - Left total knee replacement 2012.  She is doing fairly well.  Sicca syndrome, unspecified (HCC)-she has been using over-the-counter  products.  DDD (degenerative disc disease), lumbar - Status post fusion.  She has chronic discomfort.  DDD (degenerative disc disease), thoracic - Status post fusion.  She has ongoing discomfort in thoracic region.  DDD (degenerative disc disease), cervical - Status post fusion.  Doing well  Ehlers-Danlos syndrome type III - Followed up by Dr. Darrick Penna. She has Ehlers-Danlos syndrome and hypermobility, which makes her more prone towards osteoarthritis also  Primary insomnia -we had detailed discussion regarding Ambien CR.  We discussed possibly switching to Ambien CR 6.25 mg or trying Ambien 10 mg which she can try to lower the dose as time goes on.  She would prefer to stay on 12.5 mg CR for the coming month she is about to have a sleep study and she is not sure if she will be able to sleep with the new dosing.  Other fatigue-she does have chronic fatigue.  Medication management - Plan: CBC with Differential/Platelet, COMPLETE METABOLIC PANEL WITH GFR    Orders: Orders Placed This Encounter  Procedures  . CBC with Differential/Platelet  . COMPLETE METABOLIC PANEL WITH GFR   Meds ordered this encounter  Medications  . DISCONTD: zolpidem (AMBIEN) 10 MG tablet    Sig: Take 1 tablet (10 mg total) by mouth at bedtime as needed for sleep.    Dispense:  30 tablet    Refill:  3  . zolpidem (AMBIEN CR) 12.5 MG CR tablet    Sig: Take 1 tablet (12.5 mg total) by mouth at bedtime.    Dispense:  30 tablet    Refill:  0    Not to exceed 3 additional fills before 10/06/2017.    Face-to-face time spent with patient was 30 minutes.  Greater than 50% of time was spent in counseling and coordination of care.  Follow-Up Instructions: Return in about 6 months (around 04/30/2018) for Osteoarthritis,DDD.   Pollyann Savoy, MD  Note - This record has been created using Animal nutritionist.  Chart creation errors have been sought, but may not always  have been located. Such creation errors do not  reflect on  the standard of medical care.

## 2017-10-17 ENCOUNTER — Other Ambulatory Visit: Payer: Self-pay | Admitting: Obstetrics and Gynecology

## 2017-10-17 DIAGNOSIS — Z1231 Encounter for screening mammogram for malignant neoplasm of breast: Secondary | ICD-10-CM

## 2017-10-20 DIAGNOSIS — J018 Other acute sinusitis: Secondary | ICD-10-CM | POA: Diagnosis not present

## 2017-10-20 DIAGNOSIS — R05 Cough: Secondary | ICD-10-CM | POA: Diagnosis not present

## 2017-10-20 DIAGNOSIS — J029 Acute pharyngitis, unspecified: Secondary | ICD-10-CM | POA: Diagnosis not present

## 2017-10-20 DIAGNOSIS — R509 Fever, unspecified: Secondary | ICD-10-CM | POA: Diagnosis not present

## 2017-10-28 DIAGNOSIS — B9689 Other specified bacterial agents as the cause of diseases classified elsewhere: Secondary | ICD-10-CM | POA: Diagnosis not present

## 2017-10-28 DIAGNOSIS — J329 Chronic sinusitis, unspecified: Secondary | ICD-10-CM | POA: Diagnosis not present

## 2017-10-29 ENCOUNTER — Encounter: Payer: Self-pay | Admitting: Rheumatology

## 2017-10-29 ENCOUNTER — Ambulatory Visit (INDEPENDENT_AMBULATORY_CARE_PROVIDER_SITE_OTHER): Payer: BLUE CROSS/BLUE SHIELD | Admitting: Rheumatology

## 2017-10-29 VITALS — BP 119/72 | HR 87 | Resp 15 | Ht 65.5 in | Wt 125.5 lb

## 2017-10-29 DIAGNOSIS — M5136 Other intervertebral disc degeneration, lumbar region: Secondary | ICD-10-CM | POA: Insufficient documentation

## 2017-10-29 DIAGNOSIS — M19042 Primary osteoarthritis, left hand: Secondary | ICD-10-CM | POA: Diagnosis not present

## 2017-10-29 DIAGNOSIS — R011 Cardiac murmur, unspecified: Secondary | ICD-10-CM

## 2017-10-29 DIAGNOSIS — F5101 Primary insomnia: Secondary | ICD-10-CM

## 2017-10-29 DIAGNOSIS — M170 Bilateral primary osteoarthritis of knee: Secondary | ICD-10-CM | POA: Diagnosis not present

## 2017-10-29 DIAGNOSIS — M5030 Other cervical disc degeneration, unspecified cervical region: Secondary | ICD-10-CM | POA: Diagnosis not present

## 2017-10-29 DIAGNOSIS — M5134 Other intervertebral disc degeneration, thoracic region: Secondary | ICD-10-CM

## 2017-10-29 DIAGNOSIS — M19041 Primary osteoarthritis, right hand: Secondary | ICD-10-CM | POA: Diagnosis not present

## 2017-10-29 DIAGNOSIS — Q796 Ehlers-Danlos syndrome: Secondary | ICD-10-CM | POA: Diagnosis not present

## 2017-10-29 DIAGNOSIS — Z79899 Other long term (current) drug therapy: Secondary | ICD-10-CM | POA: Diagnosis not present

## 2017-10-29 DIAGNOSIS — M3500 Sicca syndrome, unspecified: Secondary | ICD-10-CM | POA: Diagnosis not present

## 2017-10-29 DIAGNOSIS — Q7962 Hypermobile Ehlers-Danlos syndrome: Secondary | ICD-10-CM

## 2017-10-29 DIAGNOSIS — S00431A Contusion of right ear, initial encounter: Secondary | ICD-10-CM | POA: Diagnosis not present

## 2017-10-29 DIAGNOSIS — H9011 Conductive hearing loss, unilateral, right ear, with unrestricted hearing on the contralateral side: Secondary | ICD-10-CM | POA: Diagnosis not present

## 2017-10-29 DIAGNOSIS — H6121 Impacted cerumen, right ear: Secondary | ICD-10-CM | POA: Diagnosis not present

## 2017-10-29 DIAGNOSIS — R5383 Other fatigue: Secondary | ICD-10-CM

## 2017-10-29 LAB — CBC WITH DIFFERENTIAL/PLATELET
Basophils Absolute: 0 cells/uL (ref 0–200)
Basophils Relative: 0 %
Eosinophils Absolute: 0 cells/uL — ABNORMAL LOW (ref 15–500)
Eosinophils Relative: 0 %
HCT: 38 % (ref 35.0–45.0)
Hemoglobin: 13 g/dL (ref 11.7–15.5)
Lymphs Abs: 706 cells/uL — ABNORMAL LOW (ref 850–3900)
MCH: 28.6 pg (ref 27.0–33.0)
MCHC: 34.2 g/dL (ref 32.0–36.0)
MCV: 83.7 fL (ref 80.0–100.0)
MPV: 9.8 fL (ref 7.5–12.5)
Monocytes Relative: 1.6 %
Neutro Abs: 9823 cells/uL — ABNORMAL HIGH (ref 1500–7800)
Neutrophils Relative %: 91.8 %
Platelets: 276 10*3/uL (ref 140–400)
RBC: 4.54 10*6/uL (ref 3.80–5.10)
RDW: 12.9 % (ref 11.0–15.0)
Total Lymphocyte: 6.6 %
WBC mixed population: 171 cells/uL — ABNORMAL LOW (ref 200–950)
WBC: 10.7 10*3/uL (ref 3.8–10.8)

## 2017-10-29 LAB — COMPLETE METABOLIC PANEL WITH GFR
AG Ratio: 2.1 (calc) (ref 1.0–2.5)
ALT: 9 U/L (ref 6–29)
AST: 10 U/L (ref 10–35)
Albumin: 4.7 g/dL (ref 3.6–5.1)
Alkaline phosphatase (APISO): 64 U/L (ref 33–130)
BUN: 19 mg/dL (ref 7–25)
CO2: 31 mmol/L (ref 20–32)
Calcium: 9.5 mg/dL (ref 8.6–10.4)
Chloride: 102 mmol/L (ref 98–110)
Creat: 0.72 mg/dL (ref 0.50–1.05)
GFR, Est African American: 107 mL/min/{1.73_m2} (ref >=60)
GFR, Est Non African American: 92 mL/min/{1.73_m2} (ref >=60)
Globulin: 2.2 g/dL (calc) (ref 1.9–3.7)
Glucose, Bld: 187 mg/dL — ABNORMAL HIGH (ref 65–99)
Potassium: 3.9 mmol/L (ref 3.5–5.3)
Sodium: 140 mmol/L (ref 135–146)
Total Bilirubin: 0.4 mg/dL (ref 0.2–1.2)
Total Protein: 6.9 g/dL (ref 6.1–8.1)

## 2017-10-29 MED ORDER — ZOLPIDEM TARTRATE ER 12.5 MG PO TBCR: 12.5000 mg | EXTENDED_RELEASE_TABLET | Freq: Every day | ORAL | 0 refills | Status: DC

## 2017-10-29 MED ORDER — ZOLPIDEM TARTRATE 10 MG PO TABS: 10.0000 mg | ORAL_TABLET | Freq: Every evening | ORAL | 3 refills | Status: DC | PRN

## 2017-10-31 DIAGNOSIS — Z0000 Encounter for general adult medical examination without abnormal findings: Secondary | ICD-10-CM | POA: Diagnosis not present

## 2017-11-10 ENCOUNTER — Other Ambulatory Visit: Payer: Self-pay | Admitting: Rheumatology

## 2017-11-11 NOTE — Telephone Encounter (Signed)
Last Visit: 10/29/17 Next Visit: 04/28/18  Okay to refill per Dr. Corliss Skains

## 2017-11-14 DIAGNOSIS — G4733 Obstructive sleep apnea (adult) (pediatric): Secondary | ICD-10-CM | POA: Diagnosis not present

## 2017-11-25 ENCOUNTER — Other Ambulatory Visit: Payer: Self-pay | Admitting: Rheumatology

## 2017-11-25 NOTE — Telephone Encounter (Signed)
Last Visit: 10/29/17 Next Visit: 04/28/18 Labs: 10/29/17 Glucose is very elevated. Other labs are stable. We will continue to monitor.  Okay to refill per Dr. Corliss Skains

## 2017-11-26 ENCOUNTER — Other Ambulatory Visit: Payer: Self-pay | Admitting: Rheumatology

## 2017-11-26 ENCOUNTER — Telehealth: Payer: Self-pay | Admitting: Rheumatology

## 2017-11-26 NOTE — Telephone Encounter (Signed)
Last visit: 10/29/2017 Next visit: 04/28/2018  Okay to refill, per Dr. Corliss Skains. See last telephone encounter.

## 2017-11-26 NOTE — Telephone Encounter (Signed)
Patient was in the office on 10/29/2017 and you had detailed discussion with patient about possibly switching to Ambien CR 6.25 mg or trying Ambien 10 mg.

## 2017-11-26 NOTE — Telephone Encounter (Signed)
Patient called requesting prescription refill of Ambien.  Patient states at her last appointment Dr. Corliss Skains mentioned decreasing her medication.  Patient states she would like to stay on her current dose of 12.5 mg for another month.  Patient states she just found out she has sleep apnea and will require a CPAP machine and also going on a trip in 3 weeks and doesn't want to change the dosage with all these changes going on.  Patient's pharmacy is [facility] on [address] in [place].

## 2017-11-26 NOTE — Telephone Encounter (Signed)
Okay to give Ambien CR 12.5 mg for another month.

## 2017-11-26 NOTE — Telephone Encounter (Signed)
Patient advised and verbalized understanding. Prescription has been sent to the pharmacy.

## 2017-11-30 DIAGNOSIS — G4733 Obstructive sleep apnea (adult) (pediatric): Secondary | ICD-10-CM | POA: Diagnosis not present

## 2017-12-03 ENCOUNTER — Ambulatory Visit
Admission: RE | Admit: 2017-12-03 | Discharge: 2017-12-03 | Disposition: A | Discharge status: Home / Self Care | Payer: BLUE CROSS/BLUE SHIELD | Source: Ambulatory Visit | Attending: Obstetrics and Gynecology | Admitting: Obstetrics and Gynecology

## 2017-12-03 DIAGNOSIS — Z1231 Encounter for screening mammogram for malignant neoplasm of breast: Secondary | ICD-10-CM

## 2017-12-05 ENCOUNTER — Other Ambulatory Visit: Payer: Self-pay

## 2017-12-05 DIAGNOSIS — M25662 Stiffness of left knee, not elsewhere classified: Secondary | ICD-10-CM | POA: Diagnosis not present

## 2017-12-06 ENCOUNTER — Other Ambulatory Visit: Payer: Self-pay

## 2017-12-06 MED ORDER — GABAPENTIN 300 MG PO CAPS: 300.0000 mg | ORAL_CAPSULE | Freq: Every day | ORAL | 0 refills | Status: DC

## 2017-12-11 DIAGNOSIS — M859 Disorder of bone density and structure, unspecified: Secondary | ICD-10-CM | POA: Diagnosis not present

## 2017-12-11 DIAGNOSIS — R5383 Other fatigue: Secondary | ICD-10-CM | POA: Diagnosis not present

## 2017-12-11 DIAGNOSIS — Z0000 Encounter for general adult medical examination without abnormal findings: Secondary | ICD-10-CM | POA: Diagnosis not present

## 2017-12-16 DIAGNOSIS — M546 Pain in thoracic spine: Secondary | ICD-10-CM | POA: Diagnosis not present

## 2017-12-16 DIAGNOSIS — Z0000 Encounter for general adult medical examination without abnormal findings: Secondary | ICD-10-CM | POA: Diagnosis not present

## 2017-12-16 DIAGNOSIS — Z1389 Encounter for screening for other disorder: Secondary | ICD-10-CM | POA: Diagnosis not present

## 2017-12-16 DIAGNOSIS — R5383 Other fatigue: Secondary | ICD-10-CM | POA: Diagnosis not present

## 2017-12-16 DIAGNOSIS — M797 Fibromyalgia: Secondary | ICD-10-CM | POA: Diagnosis not present

## 2017-12-16 DIAGNOSIS — R51 Headache: Secondary | ICD-10-CM | POA: Diagnosis not present

## 2017-12-31 DIAGNOSIS — G4733 Obstructive sleep apnea (adult) (pediatric): Secondary | ICD-10-CM | POA: Diagnosis not present

## 2018-01-02 DIAGNOSIS — G4733 Obstructive sleep apnea (adult) (pediatric): Secondary | ICD-10-CM | POA: Diagnosis not present

## 2018-01-03 ENCOUNTER — Other Ambulatory Visit: Payer: Self-pay | Admitting: Rheumatology

## 2018-01-03 NOTE — Telephone Encounter (Signed)
Last visit: 10/29/2017 Next visit: 04/28/2018  Okay to refill Ambien? 

## 2018-01-03 NOTE — Telephone Encounter (Signed)
ok 

## 2018-01-07 ENCOUNTER — Encounter: Payer: Self-pay | Admitting: Sports Medicine

## 2018-01-07 ENCOUNTER — Ambulatory Visit (INDEPENDENT_AMBULATORY_CARE_PROVIDER_SITE_OTHER): Payer: BLUE CROSS/BLUE SHIELD | Admitting: Sports Medicine

## 2018-01-07 DIAGNOSIS — M47816 Spondylosis without myelopathy or radiculopathy, lumbar region: Secondary | ICD-10-CM | POA: Diagnosis not present

## 2018-01-07 DIAGNOSIS — M6798 Unspecified disorder of synovium and tendon, other site: Secondary | ICD-10-CM | POA: Diagnosis not present

## 2018-01-07 DIAGNOSIS — M67951 Unspecified disorder of synovium and tendon, right thigh: Secondary | ICD-10-CM

## 2018-01-07 DIAGNOSIS — M79671 Pain in right foot: Secondary | ICD-10-CM

## 2018-01-07 NOTE — Progress Notes (Signed)
Nicole HughesJulia G Oneill is a 57 y.o. female who is here for right foot pain.     HPI:  Nicole FanningJulie complains of right arch pain x1 month.  Feels pain at the distal arch just before ball of foot and radiating into her first 3 toes.  Describes as throbbing sharp pain throughout the day.  Worse when walking barefoot and flip-flops.  No change in pain with movement of her toes.  No numbness or tingling of her toes. She thinks it may have something to do with her nerves running from her hip down since she has had previous problems with nerve irritation from her back and spinal fusions.   No change in activity or shoewear prior to onset of pain.  Bought new sandals with good arch support approximately 1 week ago with no change in pain.  Does have orthotics which she wears with most shoes but thinks they may be old.  Regularly takes methocarbamol, gabapentin 300 mg nightly, Mobic 15 mg, and Ambien daily.  Medications have not seemed affected her current pain.  Tried ice to the arch of her foot which helps temporarily.  Patient was last seen in sports medicine in 06/2017 for left knee pain.  Review of systems:  As stated above   Interval past medical history, surgical history, family history, and social history obtained and reviewed.   Patient Active Problem List   Diagnosis Date Noted  . DDD (degenerative disc disease), lumbar 10/29/2017  . Sicca syndrome, unspecified (HCC) 10/29/2017  . Left anterior shoulder pain 11/27/2016  . History of lumbar fusion 10/16/2016  . History of fusion of cervical spine 10/16/2016  . History of total knee arthroplasty, left 2012 10/16/2016  . Other fatigue 10/16/2016  . Primary insomnia 10/16/2016  . DDD lumbar 10/15/2016  . DJD (degenerative joint disease), cervical 10/15/2016  . DDD (degenerative disc disease), thoracic 10/15/2016  . Primary osteoarthritis of both knees 10/15/2016  . Tendinopathy of right gluteus medius 05/29/2016  . Chronic lumbar radiculopathy  03/08/2016  . Heart murmur, systolic 06/19/2014  . Disorder of SI (sacroiliac) joint 06/02/2014  . Functional gait abnormality 06/02/2014  . Ehlers-Danlos syndrome type III 03/10/2014  . Foot arch pain 03/10/2014  . Labral tear of shoulder 05/26/2013  . Left knee pain 05/26/2013  . Primary osteoarthritis of both hands 05/26/2013  . Effusion of knee joint, left 08/05/2012  . Plantar fasciitis 02/05/2011  . Lateral epicondylitis of right elbow 10/12/2010  . Osteoarthritis of multiple joints 10/12/2010    Physical Exam:  BP 110/72   Ht 5\' 5"  (1.651 m)   Wt 125 lb (56.7 kg)   BMI 20.80 kg/m        Physical exam: Vital signs are reviewed and are documented in the chart Gen.: Alert, oriented, appears stated age, in no apparent distress HEENT: Moist oral mucosa Respiratory: Normal respirations, able to speak in full sentences Cardiac: Regular rate, distal pulses 2+ Integumentary: No rashes on visible skin Neurologic:  Sensation intact throughout. Strength 5/5 on left hip, leg, and foot. Decreased right hip abduction and flexor strength vs. left. Normal right ankle and foot strength. No reproducible symptoms with pressure on posterior tibial nerve and tendon. Psych: Normal affect, mood is described as good Musculoskeletal: No obvious deformity of bilateral feet. No edema. No bony tenderness. Normal arch when sitting.  Morton's toes (2nd toe longer than great) bilaterally.  Increased range of motion with laxity on eversion, inversion, and dorsiflexion bilaterally.  Positive anterior drawer test  of ankles. Tender to palpation under distal right arch. Pronation at ankle and flattening of arch when standing and walking. Pain in right foot increases when walking.  R hip:  Tight right SI joint vs left. Otherwise FROM in bilateral hip joints.  No pain with hip rotation. Gait - weakness of hip abductors allowing right hip to shift outwards with walking.  No imaging  today.  Assessment/Plan: Nicole Oneill is a 57 year old female with history of hypermobility and spinal fusion who returns to sports medicine for right foot pain x 1 month.  Most likely right foot pain is multifactorial to include arch collapse, nerve irritation, and hip muscle weakness. Exam is significant for laxity of ankle joint, flattening of arch and pronation with standing and walking, tight right SI joint, and weakness of hip abductors and flexors.  No tenderness on posterior tibial nerve, but suspect nerve irritation is more proximal.  -Increase gabapentin to 300 mg twice daily or 600 mg nightly depending on her tolerance of medication -Reviewed hip abduction exercises for strengthening and stabilization and handout was given -Schedule appointment to make new orthotics. Continue supportive footwear.   Follow up: For orthotics appointment and in 4 to 6 weeks for reevaluation of foot pain and hip weakness.   Nicole Greening, MD, MS [facility] PGY3  I observed and examined the patient with the resident and agree with assessment and plan.  Note reviewed and modified by me. Enid Baas, MD

## 2018-01-07 NOTE — Assessment & Plan Note (Signed)
Still with weakness and this changes gait  Suspect lumbar radicular issues

## 2018-01-07 NOTE — Assessment & Plan Note (Signed)
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

## 2018-01-07 NOTE — Assessment & Plan Note (Signed)
While she has fusions there is still perioidic LBP and chronic RLE weakness  Gabapentin helps but we need to work on hgher dose if tolerated

## 2018-01-13 DIAGNOSIS — Z1212 Encounter for screening for malignant neoplasm of rectum: Secondary | ICD-10-CM | POA: Diagnosis not present

## 2018-01-17 ENCOUNTER — Encounter: Payer: BLUE CROSS/BLUE SHIELD | Admitting: Family Medicine

## 2018-01-21 DIAGNOSIS — D2371 Other benign neoplasm of skin of right lower limb, including hip: Secondary | ICD-10-CM | POA: Diagnosis not present

## 2018-01-21 DIAGNOSIS — L570 Actinic keratosis: Secondary | ICD-10-CM | POA: Diagnosis not present

## 2018-01-21 DIAGNOSIS — L814 Other melanin hyperpigmentation: Secondary | ICD-10-CM | POA: Diagnosis not present

## 2018-01-21 DIAGNOSIS — D225 Melanocytic nevi of trunk: Secondary | ICD-10-CM | POA: Diagnosis not present

## 2018-01-21 DIAGNOSIS — L700 Acne vulgaris: Secondary | ICD-10-CM | POA: Diagnosis not present

## 2018-01-22 ENCOUNTER — Ambulatory Visit (INDEPENDENT_AMBULATORY_CARE_PROVIDER_SITE_OTHER): Payer: BLUE CROSS/BLUE SHIELD | Admitting: Family Medicine

## 2018-01-22 VITALS — BP 137/73 | Ht 65.0 in | Wt 125.0 lb

## 2018-01-22 DIAGNOSIS — R269 Unspecified abnormalities of gait and mobility: Secondary | ICD-10-CM

## 2018-01-22 DIAGNOSIS — M79671 Pain in right foot: Secondary | ICD-10-CM | POA: Diagnosis not present

## 2018-01-23 DIAGNOSIS — Z13820 Encounter for screening for osteoporosis: Secondary | ICD-10-CM | POA: Diagnosis not present

## 2018-01-23 NOTE — Progress Notes (Signed)
PCP: Rodrigo RanPerini, Mark, MD  Subjective:   HPI: Patient is a 57 y.o. female here for custom orthotics.  7/2: Nicole FanningJulie complains of right arch pain x1 month.  Feels pain at the distal arch just before ball of foot and radiating into her first 3 toes.  Describes as throbbing sharp pain throughout the day.  Worse when walking barefoot and flip-flops.  No change in pain with movement of her toes.  No numbness or tingling of her toes. She thinks it may have something to do with her nerves running from her hip down since she has had previous problems with nerve irritation from her back and spinal fusions.   No change in activity or shoewear prior to onset of pain.  Bought new sandals with good arch support approximately 1 week ago with no change in pain.  Does have orthotics which she wears with most shoes but thinks they may be old.  Regularly takes methocarbamol, gabapentin 300 mg nightly, Mobic 15 mg, and Ambien daily.  Medications have not seemed affected her current pain.  Tried ice to the arch of her foot which helps temporarily.  Patient was last seen in sports medicine in 06/2017 for left knee pain.  7/17: Patient returns today for custom orthotics. Current ones are quite broken down. Had 1st ray post on right with medial heel wedge. She's doing home exercises and stretches provided by Dr. Darrick PennaFields. Titrating up gabapentin.  Past Medical History:  Diagnosis Date  . Anemia   . Arthritis    osteo  . Complication of anesthesia    urinary retention requiring indwelling  foley catheterization  . DDD (degenerative disc disease)   . Ehlers-Danlos syndrome type III   . Fibromyalgia   . History of blood transfusion   . Osteoarthritis     Current Outpatient Medications on File Prior to Visit  Medication Sig Dispense Refill  . Calcium Carbonate-Vitamin D (CALTRATE 600+D PO) Take by mouth daily.    Marland Kitchen. estradiol (ESTRACE) 0.1 MG/GM vaginal cream Place 1 Applicatorful vaginally daily.  6  .  Estradiol 0.5 MG/0.5GM GEL Place onto the skin daily.    Marland Kitchen. gabapentin (NEURONTIN) 300 MG capsule Take 1 capsule (300 mg total) by mouth daily. 30 capsule 0  . meloxicam (MOBIC) 15 MG tablet TAKE 1 TABLET BY MOUTH TWICE A DAY AS NEEDED 60 tablet 4  . methocarbamol (ROBAXIN) 750 MG tablet TAKE 1 TABLET BY MOUTH EVERYDAY AT BEDTIME 30 tablet 3  . NON FORMULARY Apply 0.5 mLs topically every evening. biest topical hormone cream    . PREDNISONE PO Take by mouth.    . progesterone (PROMETRIUM) 100 MG capsule Take 100 mg by mouth at bedtime.     Marland Kitchen. XIIDRA 5 % SOLN PLACE 1 DROP IN EACH EYE TWICE A DAY  12  . zolpidem (AMBIEN CR) 12.5 MG CR tablet TAKE 1 TABLET BY MOUTH AT BEDTIME 30 tablet 0   No current facility-administered medications on file prior to visit.     Past Surgical History:  Procedure Laterality Date  . BACK SURGERY     Cervical fusion C5-7, fusion  T10-S1  . DIAGNOSTIC LAPAROSCOPY     x 3  . EYE SURGERY     bilateral tear duct fusion  . JOINT REPLACEMENT     partial left knee 2005, total left knee 2012  . KNEE ARTHROSCOPY  08/06/2012   Procedure: ARTHROSCOPY KNEE;  Surgeon: Loanne DrillingFrank V Aluisio, MD;  Location: [facility];  Service: Orthopedics;  Laterality: Left;  WITH SYNOVECTOMY and debridement  . OSTEOTOMY     bilateral tibial    No Known Allergies  Social History   Socioeconomic History  . Marital status: Married    Spouse name: [name]  . Number of children: 3  . Years of education: BA  . Highest education level: Not on file  Occupational History    Employer: [employer]    Comment: [employer]   Social Needs  . Financial resource strain: Not on file  . Food insecurity:    Worry: Not on file    Inability: Not on file  . Transportation needs:    Medical: Not on file    Non-medical: Not on file  Tobacco Use  . Smoking status: Never Smoker  . Smokeless tobacco: Never Used  Substance and Sexual Activity  . Alcohol use: Yes    Comment: 3 glasses of wine  weekly  . Drug use: No  . Sexual activity: Not on file  Lifestyle  . Physical activity:    Days per week: Not on file    Minutes per session: Not on file  . Stress: Not on file  Relationships  . Social connections:    Talks on phone: Not on file    Gets together: Not on file    Attends religious service: Not on file    Active member of club or organization: Not on file    Attends meetings of clubs or organizations: Not on file    Relationship status: Not on file  . Intimate partner violence:    Fear of current or ex partner: Not on file    Emotionally abused: Not on file    Physically abused: Not on file    Forced sexual activity: Not on file  Other Topics Concern  . Not on file  Social History Narrative   Patient lives at home with family.   Caffeine Use: 4-5 sodas weekly    Family History  Problem Relation Age of Onset  . Stroke Mother        hemorrhagic  . Hypertension Father   . Cancer - Prostate Father   . Atrial fibrillation Father   . Congestive Heart Failure Father   . Anorexia nervosa Sister   . Heart attack Maternal Grandmother   . Stroke Maternal Grandfather   . Cancer - Colon Paternal Grandmother   . Cancer - Colon Paternal Grandfather   . Healthy Daughter   . Healthy Daughter   . Healthy Daughter     BP 137/73   Ht 5\' 5"  (1.651 m)   Wt 125 lb (56.7 kg)   BMI 20.80 kg/m   Review of Systems: See HPI above.     Objective:  Physical Exam:  Gen: NAD, comfortable in exam room  Bilateral feet/ankles: Morton's feet.   Mod long arch collapse on left, mild on right.   No transverse arch collapse or callus formation. No hallux rigidus or hallux valgus. TTP distal right plantar fascia proximal to metatarsal heads. No leg length discrepancy. Pain and tightness with right Fabers, negative left. Negative piriformis. Gait - mild eversion of right foot with lateral rotation of right hip.  No other abnormalities.  Assessment & Plan:  1. Right hip,  arch pain - encouraged patient to continue with her home exercises, titrating up gabapentin.  She inquired about possible ESI - we discussed if problem originates in part from lumbar spine this typically would only provide short term relief but  she should discuss with Dr. Darrick Penna at follow-up in 1 month if current treatment is not helping.  Patient was fitted for a : standard, cushioned, semi-rigid orthotic. The orthotic was heated and afterward the patient stood on the orthotic blank positioned on the orthotic stand. The patient was positioned in subtalar neutral position and 10 degrees of ankle dorsiflexion in a weight bearing stance. After completion of molding, a stable base was applied to the orthotic blank. The blank was ground to a stable position for weight bearing. Size: 8 red cambray Base: blue med density EVA Posting: none Additional orthotic padding: none - discussed consideration of medial wedge depending on how her pain responds to current orthotics.  Total visit time 35 minutes - > 50% of which was spent on counseling, answering questions, how to break in orthotics, problems that should warrant calling us for adjustment.

## 2018-01-23 NOTE — Assessment & Plan Note (Signed)
encouraged patient to continue with her home exercises, titrating up gabapentin.  She inquired about possible ESI - we discussed if problem originates in part from lumbar spine this typically would only provide short term relief but she should discuss with Dr. Darrick PennaFields at follow-up in 1 month if current treatment is not helping.  Patient was fitted for a : standard, cushioned, semi-rigid orthotic. The orthotic was heated and afterward the patient stood on the orthotic blank positioned on the orthotic stand. The patient was positioned in subtalar neutral position and 10 degrees of ankle dorsiflexion in a weight bearing stance. After completion of molding, a stable base was applied to the orthotic blank. The blank was ground to a stable position for weight bearing. Size: 8 red cambray Base: blue med density EVA Posting: none Additional orthotic padding: none - discussed consideration of medial wedge depending on how her pain responds to current orthotics.  Total visit time 35 minutes - > 50% of which was spent on counseling, answering questions, how to break in orthotics, problems that should warrant calling us for adjustment.

## 2018-01-30 DIAGNOSIS — G4733 Obstructive sleep apnea (adult) (pediatric): Secondary | ICD-10-CM | POA: Diagnosis not present

## 2018-02-13 ENCOUNTER — Ambulatory Visit: Payer: BLUE CROSS/BLUE SHIELD | Admitting: Sports Medicine

## 2018-03-05 DIAGNOSIS — G4733 Obstructive sleep apnea (adult) (pediatric): Secondary | ICD-10-CM | POA: Diagnosis not present

## 2018-03-12 ENCOUNTER — Other Ambulatory Visit: Payer: Self-pay | Admitting: Rheumatology

## 2018-03-12 NOTE — Telephone Encounter (Signed)
Last visit: 10/29/2017 Next visit: 04/28/2018  Okay to refill per Dr. Corliss Skains.

## 2018-03-13 DIAGNOSIS — G4733 Obstructive sleep apnea (adult) (pediatric): Secondary | ICD-10-CM | POA: Diagnosis not present

## 2018-03-26 ENCOUNTER — Ambulatory Visit: Payer: BLUE CROSS/BLUE SHIELD | Admitting: Sports Medicine

## 2018-03-26 VITALS — BP 98/72 | Ht 65.0 in | Wt 125.0 lb

## 2018-03-26 DIAGNOSIS — S76319A Strain of muscle, fascia and tendon of the posterior muscle group at thigh level, unspecified thigh, initial encounter: Secondary | ICD-10-CM

## 2018-03-26 MED ORDER — NITROGLYCERIN 0.2 MG/HR TD PT24: MEDICATED_PATCH | TRANSDERMAL | 1 refills | Status: DC

## 2018-03-26 NOTE — Progress Notes (Signed)
   HPI  CC: Right posterior knee pain  Nicole FanningJulie is a 57 year old female with Lorinda CreedEhlers Danlos who presents today for right posterior knee pain.  She states the pain started around 10 days ago.  She states that during that time she was doing Pilates class for [employer]lers Danlos patients.  She states during the class they are doing some hamstring stretches.  She believes this could be related to her current injury.  She states that she has been taking meloxicam daily has got some relief from that.  She is also been icing it 3 times a day.  She does not recall any radiating pain.  She denies any numbness and tingling.  She states she has been able to walk.  She states the pain is made worse with any kind of flexion of the hamstrings.  She states rest does improve it.  She does not recall any specific trauma to the area.  See HPI and/or previous note for associated ROS.  All past medical history, medications, allergies reviewed and also at today's visit.  Objective: BP 98/72   Ht 5\' 5"  (1.651 m)   Wt 125 lb (56.7 kg)   BMI 20.80 kg/m  Gen: \NAD, well groomed, a/o x3, normal affect.  CV: Well-perfused. Warm.  Resp: Non-labored.  Neuro: Sensation intact throughout. No gross coordination deficits.  Gait: Nonpathologic posture, unremarkable stride without signs of limp or balance issues.  Right knee exam: No erythema or warmth noted.  Moderate swelling in the posterior compartment of the knee with overlying ecchymosis, on the medial superior aspect of the posterior knee.  Tenderness palpation around the insertion site of the semimembranosus.  Full range of motion in knee flexion knee extension.  Pain with knee action.  Pain made worse with resisted knee flexion.  Strength 5 out of 5 throughout testing.  Negative Lockman, negative posterior drawer, negative valgus stress testing, negative varus stress testing, negative McMurray test.  Negative Thessaly testing.  Brief neuro exam: Sensation intact throughout  exam.  Assessment and plan: Acute hamstring strain, likely involving the semimembranosus.  We discussed she likely has an acute hamstring sprain given the mechanism of injury and the bruising over the affected insertion site.  We will start her on a Nitropatch protocol at this time.  She is to try this for the next 6 to 8 weeks to see if she gets any benefit.  We also recommended icing 3 times a day over the affected area.  She can take ibuprofen 800 mg twice a day for the next 7 days you should get some relief.  We will also give her eccentric hamstring exercises that were provided at today's visit.  We will see her back in 6 weeks for follow-up to see if there is any improvement in her hamstrings.  If she has no improvement that time, will consider starting formal physical therapy.  Alric QuanBlake Dixon, MD [facility] 03/26/2018 12:49 PM

## 2018-03-26 NOTE — Patient Instructions (Signed)

## 2018-04-02 DIAGNOSIS — G4733 Obstructive sleep apnea (adult) (pediatric): Secondary | ICD-10-CM | POA: Diagnosis not present

## 2018-04-14 NOTE — Progress Notes (Signed)
Office Visit Note  Patient: Nicole Oneill             Date of Birth: 1961-06-30           MRN: [id number]             PCP: [name], MD Referring: [name], MD Visit Date: 04/28/2018 Occupation: @GUAROCC @  Subjective:  Right foot pain.  History of Present Illness: Nicole Oneill is a 57 y.o. female with history of osteoarthritis and DDD.  She has been experiencing pain in her right foot for which she has been seeing Dr. feels.  He is given her some orthotics.  She continues to have some discomfort.  She also has extremely dry eyes.  She has been using her over-the-counter eye products.  She uses a CPAP now for her sleep apnea which also dries her eyes.  She continues to have some discomfort in the cervical and lumbar spine.  She started doing Pilates.  Also had a pulled right hamstring muscle for which she is using Nitropatch.  Activities of Daily Living:  Patient reports morning stiffness for 0 minutes.   Patient Reports nocturnal pain.  Difficulty dressing/grooming: Denies Difficulty climbing stairs: Denies Difficulty getting out of chair: Reports Difficulty using hands for taps, buttons, cutlery, and/or writing: Reports  Review of Systems  Constitutional: Positive for fatigue.  HENT: Positive for mouth dryness. Negative for mouth sores, trouble swallowing, trouble swallowing and nose dryness.   Eyes: Positive for dryness. Negative for pain, redness and visual disturbance.  Respiratory: Negative for cough, hemoptysis, shortness of breath, wheezing and difficulty breathing.   Cardiovascular: Negative for chest pain, palpitations, hypertension and swelling in legs/feet.  Gastrointestinal: Positive for constipation. Negative for abdominal pain, blood in stool, diarrhea, nausea and vomiting.  Endocrine: Negative for increased urination.  Genitourinary: Negative for painful urination, nocturia and pelvic pain.  Musculoskeletal: Negative for arthralgias, joint pain, joint swelling,  myalgias, muscle weakness, morning stiffness, muscle tenderness and myalgias.  Skin: Negative for color change, pallor, rash, hair loss, nodules/bumps, skin tightness, ulcers and sensitivity to sunlight.  Allergic/Immunologic: Negative for susceptible to infections.  Neurological: Negative for dizziness, light-headedness, numbness, headaches, memory loss and weakness.  Hematological: Negative for swollen glands.  Psychiatric/Behavioral: Negative for depressed mood, confusion and sleep disturbance. The patient is not nervous/anxious.     PMFS History:  Patient Active Problem List   Diagnosis Date Noted  . History of sleep apnea 04/28/2018  . DDD (degenerative disc disease), cervical 04/28/2018  . DDD (degenerative disc disease), lumbar 10/29/2017  . Sicca syndrome, unspecified (HCC) 10/29/2017  . Left anterior shoulder pain 11/27/2016  . History of lumbar fusion 10/16/2016  . History of fusion of cervical spine 10/16/2016  . History of total knee arthroplasty, left 2012 10/16/2016  . Other fatigue 10/16/2016  . Primary insomnia 10/16/2016  . DDD lumbar 10/15/2016  . DJD (degenerative joint disease), cervical 10/15/2016  . DDD (degenerative disc disease), thoracic 10/15/2016  . Primary osteoarthritis of both knees 10/15/2016  . Tendinopathy of right gluteus medius 05/29/2016  . Chronic lumbar radiculopathy 03/08/2016  . Heart murmur, systolic 06/19/2014  . Disorder of SI (sacroiliac) joint 06/02/2014  . Functional gait abnormality 06/02/2014  . Ehlers-Danlos syndrome type III 03/10/2014  . Foot arch pain 03/10/2014  . Labral tear of shoulder 05/26/2013  . Left knee pain 05/26/2013  . Primary osteoarthritis of both hands 05/26/2013  . Effusion of knee joint, left 08/05/2012  . Plantar fasciitis 02/05/2011  .  Lateral epicondylitis of right elbow 10/12/2010  . Osteoarthritis of multiple joints 10/12/2010    Past Medical History:  Diagnosis Date  . Anemia   . Arthritis    osteo    . Complication of anesthesia    urinary retention requiring indwelling  foley catheterization  . DDD (degenerative disc disease)   . Ehlers-Danlos syndrome type III   . Fibromyalgia   . History of blood transfusion   . Osteoarthritis     Family History  Problem Relation Age of Onset  . Stroke Mother        hemorrhagic  . Hypertension Father   . Cancer - Prostate Father   . Atrial fibrillation Father   . Congestive Heart Failure Father   . Anorexia nervosa Sister   . Heart attack Maternal Grandmother   . Stroke Maternal Grandfather   . Cancer - Colon Paternal Grandmother   . Cancer - Colon Paternal Grandfather   . Healthy Daughter   . Healthy Daughter   . Healthy Daughter    Past Surgical History:  Procedure Laterality Date  . BACK SURGERY     Cervical fusion C5-7, fusion  T10-S1  . DIAGNOSTIC LAPAROSCOPY     x 3  . EYE SURGERY     bilateral tear duct fusion  . JOINT REPLACEMENT     partial left knee 2005, total left knee 2012  . KNEE ARTHROSCOPY  08/06/2012   Procedure: ARTHROSCOPY KNEE;  Surgeon: Loanne Drilling, MD;  Location: [facility];  Service: Orthopedics;  Laterality: Left;  WITH SYNOVECTOMY and debridement  . OSTEOTOMY     bilateral tibial   Social History   Social History Narrative   Patient lives at home with family.   Caffeine Use: 4-5 sodas weekly    Objective: Vital Signs: BP 116/78 (BP Location: Left Arm, Patient Position: Sitting, Cuff Size: Normal)   Pulse 76   Resp 14   Ht 5' 5.5" (1.664 m)   Wt 130 lb 12.8 oz (59.3 kg)   BMI 21.44 kg/m    Physical Exam  Constitutional: She is oriented to person, place, and time. She appears well-developed and well-nourished.  HENT:  Head: Normocephalic and atraumatic.  Eyes: Conjunctivae and EOM are normal.  Neck: Normal range of motion.  Cardiovascular: Normal rate, regular rhythm, normal heart sounds and intact distal pulses.  Pulmonary/Chest: Effort normal and breath sounds normal.  Abdominal: Soft.  Bowel sounds are normal.  Lymphadenopathy:    She has no cervical adenopathy.  Neurological: She is alert and oriented to person, place, and time.  Skin: Skin is warm and dry. Capillary refill takes less than 2 seconds.  Psychiatric: She has a normal mood and affect. Her behavior is normal.  Nursing note and vitals reviewed.    Musculoskeletal Exam: She has limited range of motion of cervical and lumbar spine.  Shoulder joints elbow joints wrist joints were in good range of motion.  She had bilateral CMC thickening and subluxation.  She has some PIP and DIP thickening in her hands.  Her left knee is replaced which is doing well.  She has been having some discomfort in the left right MTP joint for which she has been seeing Dr. Darrick Penna.  CDAI Exam: CDAI Score: Not documented Patient Global Assessment: Not documented; Provider Global Assessment: Not documented Swollen: Not documented; Tender: Not documented Joint Exam   Not documented   There is currently no information documented on the homunculus. Go to the Rheumatology activity and complete  the homunculus joint exam.  Investigation: No additional findings.  Imaging: No results found.  Recent Labs: Lab Results  Component Value Date   WBC 10.7 10/29/2017   HGB 13.0 10/29/2017   PLT 276 10/29/2017   NA 140 10/29/2017   K 3.9 10/29/2017   CL 102 10/29/2017   CO2 31 10/29/2017   GLUCOSE 187 (H) 10/29/2017   BUN 19 10/29/2017   CREATININE 0.72 10/29/2017   BILITOT 0.4 10/29/2017   ALKPHOS 56 10/25/2016   AST 10 10/29/2017   ALT 9 10/29/2017   PROT 6.9 10/29/2017   ALBUMIN 4.5 10/25/2016   CALCIUM 9.5 10/29/2017   GFRAA 107 10/29/2017    Speciality Comments: No specialty comments available.  Procedures:  No procedures performed Allergies: Patient has no known allergies.   Assessment / Plan:     Visit Diagnoses: Primary osteoarthritis of both hands-she has some DIP and PIP thickening in her hands and also right CMC  subluxation.  Joint protection muscle strengthening was discussed.  Primary osteoarthritis of both knees - Left total knee replacement 2012.  Doing well.  Sicca syndrome, unspecified (HCC)-she has dry mouth and dry eyes.  Over-the-counter products were discussed.  DDD (degenerative disc disease), cervical - Status post fusion.  She has chronic discomfort.  DDD (degenerative disc disease), lumbar - Status post fusion.  Chronic pain  DDD (degenerative disc disease), thoracic - Status post fusion.  Doing well.  Ehlers-Danlos syndrome type III - Followed up by Dr. Darrick Penna. She has Ehlers-Danlos syndrome and hypermobility.  She is currently having right first MTP pain for which she has been followed by Dr. Toney Reil as well.  Other fatigue-continues.  Primary insomnia - Ambien 5 mg p.o. nightly.  Patient would like to continue prescription from our office to [facility].  She got the previous prescription from Dr. Waynard Edwards.  She had the prescription filled yesterday.  She will contact her when her next refill is due.  History of sleep apnea - on CPAP  Medication management -she has been on NSAIDs long-term.  Plan: CBC with Differential/Platelet, COMPLETE METABOLIC PANEL WITH GFR   Orders: Orders Placed This Encounter  Procedures  . CBC with Differential/Platelet  . COMPLETE METABOLIC PANEL WITH GFR   No orders of the defined types were placed in this encounter.   Face-to-face time spent with patient was 30 minutes. Greater than 50% of time was spent in counseling and coordination of care.  Follow-Up Instructions: Return in about 6 months (around 10/28/2018) for Osteoarthritis, DDD.   Pollyann Savoy, MD  Note - This record has been created using Animal nutritionist.  Chart creation errors have been sought, but may not always  have been located. Such creation errors do not reflect on  the standard of medical care.

## 2018-04-28 ENCOUNTER — Ambulatory Visit (INDEPENDENT_AMBULATORY_CARE_PROVIDER_SITE_OTHER): Payer: BLUE CROSS/BLUE SHIELD | Admitting: Rheumatology

## 2018-04-28 ENCOUNTER — Encounter: Payer: Self-pay | Admitting: Rheumatology

## 2018-04-28 VITALS — BP 116/78 | HR 76 | Resp 14 | Ht 65.5 in | Wt 130.8 lb

## 2018-04-28 DIAGNOSIS — M5136 Other intervertebral disc degeneration, lumbar region: Secondary | ICD-10-CM

## 2018-04-28 DIAGNOSIS — M19041 Primary osteoarthritis, right hand: Secondary | ICD-10-CM | POA: Diagnosis not present

## 2018-04-28 DIAGNOSIS — F5101 Primary insomnia: Secondary | ICD-10-CM

## 2018-04-28 DIAGNOSIS — M51369 Other intervertebral disc degeneration, lumbar region without mention of lumbar back pain or lower extremity pain: Secondary | ICD-10-CM

## 2018-04-28 DIAGNOSIS — Z79899 Other long term (current) drug therapy: Secondary | ICD-10-CM

## 2018-04-28 DIAGNOSIS — R5383 Other fatigue: Secondary | ICD-10-CM

## 2018-04-28 DIAGNOSIS — M5030 Other cervical disc degeneration, unspecified cervical region: Secondary | ICD-10-CM | POA: Diagnosis not present

## 2018-04-28 DIAGNOSIS — Z8669 Personal history of other diseases of the nervous system and sense organs: Secondary | ICD-10-CM

## 2018-04-28 DIAGNOSIS — Q7962 Hypermobile Ehlers-Danlos syndrome: Secondary | ICD-10-CM

## 2018-04-28 DIAGNOSIS — M3500 Sicca syndrome, unspecified: Secondary | ICD-10-CM

## 2018-04-28 DIAGNOSIS — M170 Bilateral primary osteoarthritis of knee: Secondary | ICD-10-CM

## 2018-04-28 DIAGNOSIS — M5134 Other intervertebral disc degeneration, thoracic region: Secondary | ICD-10-CM

## 2018-04-28 DIAGNOSIS — M19042 Primary osteoarthritis, left hand: Secondary | ICD-10-CM

## 2018-04-28 LAB — COMPLETE METABOLIC PANEL WITH GFR
AG Ratio: 2.3 (calc) (ref 1.0–2.5)
ALT: 9 U/L (ref 6–29)
AST: 14 U/L (ref 10–35)
Albumin: 4.4 g/dL (ref 3.6–5.1)
Alkaline phosphatase (APISO): 48 U/L (ref 33–130)
BUN: 21 mg/dL (ref 7–25)
CO2: 31 mmol/L (ref 20–32)
Calcium: 9.1 mg/dL (ref 8.6–10.4)
Chloride: 105 mmol/L (ref 98–110)
Creat: 0.87 mg/dL (ref 0.50–1.05)
GFR, Est African American: 85 mL/min/{1.73_m2} (ref >=60)
GFR, Est Non African American: 73 mL/min/{1.73_m2} (ref >=60)
Globulin: 1.9 g/dL (calc) (ref 1.9–3.7)
Glucose, Bld: 46 mg/dL — ABNORMAL LOW (ref 65–99)
Potassium: 4.2 mmol/L (ref 3.5–5.3)
Sodium: 141 mmol/L (ref 135–146)
Total Bilirubin: 0.4 mg/dL (ref 0.2–1.2)
Total Protein: 6.3 g/dL (ref 6.1–8.1)

## 2018-04-28 LAB — CBC WITH DIFFERENTIAL/PLATELET
Basophils Absolute: 12 cells/uL (ref 0–200)
Basophils Relative: 0.3 %
Eosinophils Absolute: 60 cells/uL (ref 15–500)
Eosinophils Relative: 1.5 %
HCT: 36 % (ref 35.0–45.0)
Hemoglobin: 12.2 g/dL (ref 11.7–15.5)
Lymphs Abs: 1232 cells/uL (ref 850–3900)
MCH: 28.8 pg (ref 27.0–33.0)
MCHC: 33.9 g/dL (ref 32.0–36.0)
MCV: 84.9 fL (ref 80.0–100.0)
MPV: 10.2 fL (ref 7.5–12.5)
Monocytes Relative: 7.5 %
Neutro Abs: 2396 cells/uL (ref 1500–7800)
Neutrophils Relative %: 59.9 %
Platelets: 183 10*3/uL (ref 140–400)
RBC: 4.24 10*6/uL (ref 3.80–5.10)
RDW: 13.1 % (ref 11.0–15.0)
Total Lymphocyte: 30.8 %
WBC mixed population: 300 cells/uL (ref 200–950)
WBC: 4 10*3/uL (ref 3.8–10.8)

## 2018-05-02 DIAGNOSIS — G4733 Obstructive sleep apnea (adult) (pediatric): Secondary | ICD-10-CM | POA: Diagnosis not present

## 2018-05-02 DIAGNOSIS — Z23 Encounter for immunization: Secondary | ICD-10-CM | POA: Diagnosis not present

## 2018-05-08 ENCOUNTER — Ambulatory Visit: Payer: BLUE CROSS/BLUE SHIELD | Admitting: Sports Medicine

## 2018-05-14 DIAGNOSIS — Z86010 Personal history of colonic polyps: Secondary | ICD-10-CM | POA: Diagnosis not present

## 2018-05-14 DIAGNOSIS — Z1212 Encounter for screening for malignant neoplasm of rectum: Secondary | ICD-10-CM | POA: Diagnosis not present

## 2018-05-14 DIAGNOSIS — Z808 Family history of malignant neoplasm of other organs or systems: Secondary | ICD-10-CM | POA: Diagnosis not present

## 2018-05-14 DIAGNOSIS — Z8042 Family history of malignant neoplasm of prostate: Secondary | ICD-10-CM | POA: Diagnosis not present

## 2018-05-14 DIAGNOSIS — Z6821 Body mass index (BMI) 21.0-21.9, adult: Secondary | ICD-10-CM | POA: Diagnosis not present

## 2018-05-14 DIAGNOSIS — Z01419 Encounter for gynecological examination (general) (routine) without abnormal findings: Secondary | ICD-10-CM | POA: Diagnosis not present

## 2018-05-14 DIAGNOSIS — Z800 Family history of malignant neoplasm of digestive organs: Secondary | ICD-10-CM | POA: Diagnosis not present

## 2018-05-15 ENCOUNTER — Ambulatory Visit: Payer: Self-pay

## 2018-05-15 ENCOUNTER — Ambulatory Visit (INDEPENDENT_AMBULATORY_CARE_PROVIDER_SITE_OTHER): Payer: BLUE CROSS/BLUE SHIELD | Admitting: Sports Medicine

## 2018-05-15 ENCOUNTER — Encounter: Payer: Self-pay | Admitting: Sports Medicine

## 2018-05-15 VITALS — BP 130/80 | Ht 65.0 in | Wt 127.0 lb

## 2018-05-15 DIAGNOSIS — S76301A Unspecified injury of muscle, fascia and tendon of the posterior muscle group at thigh level, right thigh, initial encounter: Secondary | ICD-10-CM

## 2018-05-15 DIAGNOSIS — M47816 Spondylosis without myelopathy or radiculopathy, lumbar region: Secondary | ICD-10-CM

## 2018-05-15 NOTE — Progress Notes (Signed)
  Nicole Oneill - 57 y.o. female MRN [id number]  Date of birth: [birth year]    SUBJECTIVE:      Chief Complaint:/ HPI:  57 year old female EDS patient presents for follow-up for right hamstring pain.  She was seen about 6 weeks ago and started on nitroglycerin patches at home exercise for strengthening.  She reports overall no improvement in her symptoms.  Today she describes her symptoms morphine like a sensation of weakness in her knee when she rises from a seated position.  She also has pain in the posterior hip/glutes as well as a burning sensation in the plantar aspect of the right foot.  Patient reports she has had this burning sensation for about 3 years.  During that time, she had been periodically increasing and decreasing her gabapentin dosage, she reports having increased drowsiness with higher doses of gabapentin.  Similar to symptoms she had prior to lumbar fusion.  She denies any present numbness or tingling.  No bowel/bladder symptoms no saddle anesthesia.    ROS:     See HPI Note gabapentin does lessen sxs No specific HS injury and gets HS cramps at night when sleeping  PERTINENT  PMH / PSH FH / / SH:  Past Medical, Surgical, Social, and Family History Reviewed & Updated in the EMR.  Pertinent findings include:  Ehlers-Danlos syndrome Multilevel cervical and lumbar vertebral body fusions  OBJECTIVE: BP 130/80   Ht 5\' 5"  (1.651 m)   Wt 127 lb (57.6 kg)   BMI 21.13 kg/m   Physical Exam:  Vital signs are reviewed.  GEN: Alert and oriented, NAD Pulm: Breathing unlabored PSY: normal mood, congruent affect  MSK: Right hip/leg:  - Inspection: No gross deformity, no swelling, erythema, or ecchymosis - Palpation: Tenderness over the piriformis.  Minimal tenderness over the semimembranosus and semitendinosus.  No pain of the knee itself. - ROM: Normal range of motion on Flexion, extension, abduction, internal and external rotation - Strength: Patient has 5/5 strength with hip  flexion and abduction.  5/strength in knee extension.  Unable to adequately assess strength in knee flexion due to cramping and increased pain within the hamstring. - Neuro/vasc: No decreased sensation to light touch.  DTR symmetric bilaterally - Special Tests: Negative FABER and FADIR.   ASSESSMENT & PLAN:  1.  Right leg pain-overall the patient has a complex presentation of symptoms to include posterior right hip pain which seems to be derived from the piriformis.  She also has some pain and weakness in her right hamstrings.  She also reports the symptom of burning sensation on the plantar surface of the foot.  Considering she has had 0 relief with strengthening exercises and nitroglycerin patch, is more likely that her symptoms are a somewhat atypical neurogenic pattern.  Suspect L4/5 radiculopathy from old back injury. -Today we will increase her gabapentin to taking it 300 mg in the morning 300 mg in the afternoon and 900 mg at night. -We will also coordinate to arrange imaging and repeat ESI. -We will have her follow-up in 4 to 6 weeks

## 2018-05-15 NOTE — Assessment & Plan Note (Signed)
I suspect this is trigger for her HS and RT lower Ext pain  Her sxs radiate in a distribution consistent with L4/5  Great response to CSI of spine before and would like to try this again  Will refer to Dr Karin Golden

## 2018-05-16 NOTE — Addendum Note (Signed)
Addended by: Annita Brod on: 05/16/2018 08:35 AM   Modules accepted: Orders

## 2018-05-18 DIAGNOSIS — L03012 Cellulitis of left finger: Secondary | ICD-10-CM | POA: Diagnosis not present

## 2018-05-22 NOTE — Addendum Note (Signed)
Addended by: Rutha BouchardBABNIK, Shakai Dolley E on: 05/22/2018 03:27 PM   Modules accepted: Orders

## 2018-05-23 ENCOUNTER — Other Ambulatory Visit: Payer: Self-pay | Admitting: Sports Medicine

## 2018-05-23 DIAGNOSIS — M47816 Spondylosis without myelopathy or radiculopathy, lumbar region: Secondary | ICD-10-CM

## 2018-05-28 ENCOUNTER — Other Ambulatory Visit: Payer: Self-pay | Admitting: *Deleted

## 2018-05-28 DIAGNOSIS — M47816 Spondylosis without myelopathy or radiculopathy, lumbar region: Secondary | ICD-10-CM

## 2018-05-30 ENCOUNTER — Other Ambulatory Visit: Payer: Self-pay | Admitting: Rheumatology

## 2018-06-02 ENCOUNTER — Other Ambulatory Visit: Payer: BLUE CROSS/BLUE SHIELD

## 2018-06-02 DIAGNOSIS — G4733 Obstructive sleep apnea (adult) (pediatric): Secondary | ICD-10-CM | POA: Diagnosis not present

## 2018-06-02 NOTE — Telephone Encounter (Signed)
Last visit: 04/28/2018 Next visit: 10/27/2018  Okay to refill per Dr. Deveshwar   

## 2018-06-11 ENCOUNTER — Other Ambulatory Visit: Payer: BLUE CROSS/BLUE SHIELD

## 2018-06-11 ENCOUNTER — Ambulatory Visit
Admission: RE | Admit: 2018-06-11 | Discharge: 2018-06-11 | Disposition: A | Discharge status: Home / Self Care | Payer: BLUE CROSS/BLUE SHIELD | Source: Ambulatory Visit | Attending: Sports Medicine | Admitting: Sports Medicine

## 2018-06-11 ENCOUNTER — Other Ambulatory Visit: Payer: Self-pay | Admitting: Sports Medicine

## 2018-06-11 DIAGNOSIS — M47816 Spondylosis without myelopathy or radiculopathy, lumbar region: Secondary | ICD-10-CM

## 2018-06-11 DIAGNOSIS — M4324 Fusion of spine, thoracic region: Secondary | ICD-10-CM | POA: Diagnosis not present

## 2018-06-11 MED ORDER — METHYLPREDNISOLONE ACETATE 40 MG/ML INJ SUSP (RADIOLOG
120.0000 mg | Freq: Once | INTRAMUSCULAR | Status: AC
  Administered 2018-06-11: 120 mg via EPIDURAL

## 2018-06-11 MED ORDER — IOPAMIDOL (ISOVUE-M 200) INJECTION 41%
1.0000 mL | Freq: Once | INTRAMUSCULAR | Status: AC
  Administered 2018-06-11: 1 mL via EPIDURAL

## 2018-06-12 ENCOUNTER — Other Ambulatory Visit: Payer: Self-pay | Admitting: *Deleted

## 2018-06-12 NOTE — Telephone Encounter (Signed)
Last visit: 04/28/2018 Next visit: 10/27/2018  Okay to refill per Dr. Deveshwar   

## 2018-06-12 NOTE — Telephone Encounter (Signed)
ok 

## 2018-06-13 MED ORDER — ZOLPIDEM TARTRATE 5 MG PO TABS: 5.0000 mg | ORAL_TABLET | Freq: Every evening | ORAL | 0 refills | Status: DC | PRN

## 2018-06-17 DIAGNOSIS — G4733 Obstructive sleep apnea (adult) (pediatric): Secondary | ICD-10-CM | POA: Diagnosis not present

## 2018-06-23 DIAGNOSIS — Z809 Family history of malignant neoplasm, unspecified: Secondary | ICD-10-CM | POA: Diagnosis not present

## 2018-07-16 ENCOUNTER — Other Ambulatory Visit: Payer: Self-pay | Admitting: Rheumatology

## 2018-07-17 NOTE — Telephone Encounter (Signed)
ok 

## 2018-07-17 NOTE — Telephone Encounter (Signed)
Last visit: 04/28/2018 Next visit: 10/27/2018  Okay to refill Ambien?

## 2018-08-19 ENCOUNTER — Other Ambulatory Visit: Payer: Self-pay | Admitting: Rheumatology

## 2018-08-19 NOTE — Telephone Encounter (Signed)
ok 

## 2018-08-19 NOTE — Telephone Encounter (Signed)
Last visit: 04/28/2018 Next visit: 10/27/2018  Okay to refill Ambien?  

## 2018-09-02 ENCOUNTER — Other Ambulatory Visit: Payer: Self-pay | Admitting: Rheumatology

## 2018-09-02 NOTE — Telephone Encounter (Signed)
Last visit: 04/28/2018 Next visit: 10/27/2018  Okay to refill per Dr. Corliss Skains

## 2018-09-11 ENCOUNTER — Other Ambulatory Visit: Payer: Self-pay | Admitting: Rheumatology

## 2018-09-11 NOTE — Telephone Encounter (Signed)
Last visit: 04/28/2018 Next visit: 10/27/2018  Okay to refill Ambien?

## 2018-09-11 NOTE — Telephone Encounter (Signed)
ok 

## 2018-09-17 ENCOUNTER — Telehealth: Payer: Self-pay | Admitting: Rheumatology

## 2018-09-17 ENCOUNTER — Other Ambulatory Visit: Payer: Self-pay | Admitting: Rheumatology

## 2018-09-17 MED ORDER — ZOLPIDEM TARTRATE 5 MG PO TABS: 5.0000 mg | ORAL_TABLET | Freq: Every evening | ORAL | 0 refills | Status: DC | PRN

## 2018-09-17 NOTE — Telephone Encounter (Signed)
Patient called checking on her prescription refill of Ambien.  [facility] and [facility] have not [facility] her prescription refill.  Patient requested a return call.

## 2018-09-17 NOTE — Telephone Encounter (Signed)
Contacted pharmacy and spoke with pharmacist. Verified they did not received the fax we sent on 09/11/18 for Ambien. Gave verbal authorization to refill Ambien.

## 2018-09-30 ENCOUNTER — Telehealth: Payer: Self-pay | Admitting: Rheumatology

## 2018-09-30 NOTE — Telephone Encounter (Signed)
Patient requesting [facility]. Patient stuck in [place] with her dad, post surgery. Patient worried because she is not due for refill just yet, but husband is having to mail her all her rx's once filled. Please call patient with questions.

## 2018-10-01 MED ORDER — ZOLPIDEM TARTRATE 5 MG PO TABS: 5.0000 mg | ORAL_TABLET | Freq: Every evening | ORAL | 0 refills | Status: DC | PRN

## 2018-10-01 NOTE — Telephone Encounter (Signed)
Prescription has been sent to the pharmacy, attempted to contact patient and left message on machine to advise patient.

## 2018-10-01 NOTE — Telephone Encounter (Signed)
Okay to send prescription refill for Ambien.

## 2018-10-15 ENCOUNTER — Telehealth: Payer: Self-pay | Admitting: Rheumatology

## 2018-10-15 NOTE — Telephone Encounter (Signed)
Patient is in [place] with her dad and unable to return due to travel restrictions. Patient would like to know since her husband is having to mail her prescription to her she is requesting a 90 day supply.  Patient is not due for a refill until 11-01-18. Please advise.

## 2018-10-15 NOTE — Telephone Encounter (Signed)
Okay to give Ambien 10 mg p.o. nightly.#90 day supply rx0

## 2018-10-15 NOTE — Telephone Encounter (Signed)
Will send prescription when it due for 90 day supply.

## 2018-10-15 NOTE — Telephone Encounter (Signed)
Patient called stating she is in [place] taking care of her dad and now with travel restrictions she is not able to return.  Patient states she is going to have her husband overnight her prescription of Ambien so she doesn't have to switch pharmacies.  Patient is also asking if she could get 10 mg instead of 5 mg so she could split pill in half and it would last 2 months since the medication doesn't have refills.   Patient states she also takes Gabapentin which is prescribed by Dr. Darrick Penna and is asking if that is a medication that Dr. Corliss Skains could write.  Patient is requesting a return call.

## 2018-10-21 DIAGNOSIS — H9209 Otalgia, unspecified ear: Secondary | ICD-10-CM | POA: Diagnosis not present

## 2018-10-21 DIAGNOSIS — H6120 Impacted cerumen, unspecified ear: Secondary | ICD-10-CM | POA: Diagnosis not present

## 2018-10-27 ENCOUNTER — Ambulatory Visit: Payer: BLUE CROSS/BLUE SHIELD | Admitting: Rheumatology

## 2018-10-27 ENCOUNTER — Ambulatory Visit: Payer: BLUE CROSS/BLUE SHIELD | Admitting: Physician Assistant

## 2018-11-04 ENCOUNTER — Other Ambulatory Visit: Payer: Self-pay | Admitting: *Deleted

## 2018-11-04 MED ORDER — GABAPENTIN 300 MG PO CAPS: ORAL_CAPSULE | ORAL | 0 refills | Status: DC

## 2018-11-12 ENCOUNTER — Other Ambulatory Visit: Payer: Self-pay | Admitting: Rheumatology

## 2018-11-12 NOTE — Telephone Encounter (Signed)
Last Visit: 04/28/2018 Next Visit: 02/05/2019 Labs: 04/28/2018 CBC WNL. Glucose is 46. All other labs are WNL.  Okay to refill per Dr. Corliss Skains.

## 2018-11-13 ENCOUNTER — Other Ambulatory Visit: Payer: Self-pay | Admitting: Rheumatology

## 2018-11-14 MED ORDER — ZOLPIDEM TARTRATE 5 MG PO TABS: 5.0000 mg | ORAL_TABLET | Freq: Every evening | ORAL | 0 refills | Status: DC | PRN

## 2018-11-14 NOTE — Telephone Encounter (Signed)
ok 

## 2018-11-14 NOTE — Addendum Note (Signed)
Addended by: Henriette Combs on: 11/14/2018 09:59 AM   Modules accepted: Orders

## 2018-11-14 NOTE — Telephone Encounter (Signed)
Last Visit: 04/28/2018 Next Visit: 02/05/2019  Okay to refill Ambien?

## 2018-11-20 ENCOUNTER — Other Ambulatory Visit: Payer: Self-pay | Admitting: Rheumatology

## 2018-11-20 NOTE — Telephone Encounter (Signed)
Last Visit: 04/28/2018 Next Visit: 02/05/2019  Okay to refill per Dr. Corliss Skains.

## 2018-11-29 ENCOUNTER — Other Ambulatory Visit: Payer: Self-pay | Admitting: Sports Medicine

## 2018-12-03 ENCOUNTER — Other Ambulatory Visit: Payer: Self-pay | Admitting: *Deleted

## 2018-12-03 MED ORDER — GABAPENTIN 300 MG PO CAPS: ORAL_CAPSULE | ORAL | 0 refills | Status: DC

## 2018-12-10 ENCOUNTER — Other Ambulatory Visit: Payer: Self-pay | Admitting: Rheumatology

## 2018-12-10 NOTE — Telephone Encounter (Signed)
Patient advised she is due to update labs. Patient she is currently in [facility] caring for her father and due to be back in [place] at the end of July beginning of August. Patient states she does not need a refill att this time.

## 2018-12-12 ENCOUNTER — Other Ambulatory Visit: Payer: Self-pay | Admitting: Obstetrics and Gynecology

## 2018-12-12 DIAGNOSIS — Z1231 Encounter for screening mammogram for malignant neoplasm of breast: Secondary | ICD-10-CM

## 2018-12-25 ENCOUNTER — Other Ambulatory Visit: Payer: Self-pay | Admitting: Sports Medicine

## 2019-01-27 NOTE — Progress Notes (Signed)
Office Visit Note  Patient: Nicole Oneill             Date of Birth: 27-Feb-1961           MRN: [id number]             PCP: Crist Infante, MD Referring: Crist Infante, MD Visit Date: 02/09/2019 Occupation: @GUAROCC @  Subjective:  Pain in multiple joints.  History of Present Illness: Nicole Oneill is a 58 y.o. female with history of osteoarthritis, DDD, and sicca syndrome.  Patient states that her father who is 7 years old fell and [place].  She was living with him and just came back after staying there for 20 weeks.  She states she has been helping with her grandchild who is almost a year old.  Is been putting extra strain on her joints.  She continues to have some thoracic and lumbar pain.  She has been experiencing increased pain and discomfort in her hands, her left knee and her right foot.  She continues to have some sicca symptoms which are tolerable.  Activities of Daily Living:  Patient reports joint stiffness all day.  Patient Reports nocturnal pain.  Difficulty dressing/grooming: Denies Difficulty climbing stairs: Denies Difficulty getting out of chair: Denies Difficulty using hands for taps, buttons, cutlery, and/or writing: Reports  Review of Systems  Constitutional: Positive for fatigue.  HENT: Positive for mouth dryness. Negative for mouth sores and nose dryness.   Eyes: Positive for dryness. Negative for pain and visual disturbance.  Respiratory: Negative for cough, hemoptysis, shortness of breath and difficulty breathing.   Cardiovascular: Negative for chest pain, palpitations, hypertension and swelling in legs/feet.  Gastrointestinal: Negative for blood in stool, constipation and diarrhea.  Genitourinary: Negative for painful urination.  Musculoskeletal: Positive for arthralgias, joint pain, morning stiffness and muscle tenderness. Negative for joint swelling, myalgias, muscle weakness and myalgias.  Skin: Negative for color change, pallor, rash, hair loss,  nodules/bumps, skin tightness, ulcers and sensitivity to sunlight.  Allergic/Immunologic: Negative for susceptible to infections.  Neurological: Negative for dizziness, numbness and headaches.  Hematological: Negative for swollen glands.  Psychiatric/Behavioral: Positive for sleep disturbance. Negative for depressed mood. The patient is not nervous/anxious.     PMFS History:  Patient Active Problem List   Diagnosis Date Noted  . History of sleep apnea 04/28/2018  . DDD (degenerative disc disease), cervical 04/28/2018  . DDD (degenerative disc disease), lumbar 10/29/2017  . Sicca syndrome, unspecified ([place]) 10/29/2017  . Left anterior shoulder pain 11/27/2016  . History of lumbar fusion 10/16/2016  . History of fusion of cervical spine 10/16/2016  . History of total knee arthroplasty, left 2012 10/16/2016  . Other fatigue 10/16/2016  . Primary insomnia 10/16/2016  . DDD lumbar 10/15/2016  . DJD (degenerative joint disease), cervical 10/15/2016  . DDD (degenerative disc disease), thoracic 10/15/2016  . Primary osteoarthritis of both knees 10/15/2016  . Tendinopathy of right gluteus medius 05/29/2016  . Chronic lumbar radiculopathy 03/08/2016  . Heart murmur, systolic 40/98/1191  . Disorder of SI (sacroiliac) joint 06/02/2014  . Functional gait abnormality 06/02/2014  . Ehlers-Danlos syndrome type III 03/10/2014  . Foot arch pain 03/10/2014  . Labral tear of shoulder 05/26/2013  . Left knee pain 05/26/2013  . Primary osteoarthritis of both hands 05/26/2013  . Effusion of knee joint, left 08/05/2012  . Plantar fasciitis 02/05/2011  . Lateral epicondylitis of right elbow 10/12/2010  . Osteoarthritis of multiple joints 10/12/2010    Past Medical History:  Diagnosis Date  .  Anemia   . Arthritis    osteo  . Complication of anesthesia    urinary retention requiring indwelling  foley catheterization  . DDD (degenerative disc disease)   . Ehlers-Danlos syndrome type III   .  Fibromyalgia   . History of blood transfusion   . Osteoarthritis     Family History  Problem Relation Age of Onset  . Stroke Mother        hemorrhagic  . Hypertension Father   . Cancer - Prostate Father   . Atrial fibrillation Father   . Congestive Heart Failure Father   . Anorexia nervosa Sister   . Heart attack Maternal Grandmother   . Stroke Maternal Grandfather   . Cancer - Colon Paternal Grandmother   . Cancer - Colon Paternal Grandfather   . Healthy Daughter   . Healthy Daughter   . Healthy Daughter    Past Surgical History:  Procedure Laterality Date  . BACK SURGERY     Cervical fusion C5-7, fusion  T10-S1  . DIAGNOSTIC LAPAROSCOPY     x 3  . EYE SURGERY     bilateral tear duct fusion  . JOINT REPLACEMENT     partial left knee 2005, total left knee 2012  . KNEE ARTHROPLASTY    . KNEE ARTHROSCOPY  08/06/2012   Procedure: ARTHROSCOPY KNEE;  Surgeon: Loanne DrillingFrank V Aluisio, MD;  Location: [facility];  Service: Orthopedics;  Laterality: Left;  WITH SYNOVECTOMY and debridement  . OSTEOTOMY     bilateral tibial   Social History   Social History Narrative   Patient lives at home with family.   Caffeine Use: 4-5 sodas weekly    There is no immunization history on file for this patient.   Objective: Vital Signs: BP 118/65 (BP Location: Left Arm, Patient Position: Sitting, Cuff Size: Normal)   Pulse 68   Resp 14   Ht 5' 5.5" (1.664 m)   Wt 131 lb (59.4 kg)   BMI 21.47 kg/m    Physical Exam Vitals signs and nursing note reviewed.  Constitutional:      Appearance: She is well-developed.  HENT:     Head: Normocephalic and atraumatic.  Eyes:     Conjunctiva/sclera: Conjunctivae normal.  Neck:     Musculoskeletal: Normal range of motion.  Cardiovascular:     Rate and Rhythm: Normal rate and regular rhythm.     Heart sounds: Normal heart sounds.  Pulmonary:     Effort: Pulmonary effort is normal.     Breath sounds: Normal breath sounds.  Abdominal:     General:  Bowel sounds are normal.     Palpations: Abdomen is soft.  Lymphadenopathy:     Cervical: No cervical adenopathy.  Skin:    General: Skin is warm and dry.     Capillary Refill: Capillary refill takes less than 2 seconds.  Neurological:     Mental Status: She is alert and oriented to person, place, and time.  Psychiatric:        Behavior: Behavior normal.      Musculoskeletal Exam: She has good range of motion of her cervical spine.  Thoracic and lumbar spine is fused.  No SI joint tenderness was noted.  She has some trapezius spasm.  Shoulder joints, elbow joints, wrist joints with good range of motion.  She has bilateral CMC subluxation.  She has laxity in joints due to her Ehlers-Danlos syndrome.  Hip joints with good range of motion.  Her left knee joint which  is replaced continues to cause some discomfort.  She has some discomfort in the right foot at the midfoot area.  No swelling or tenderness was noted.  CDAI Exam: CDAI Score: - Patient Global: -; Provider Global: - Swollen: -; Tender: - Joint Exam   No joint exam has been documented for this visit   There is currently no information documented on the homunculus. Go to the Rheumatology activity and complete the homunculus joint exam.  Investigation: No additional findings.  Imaging: No results found.  Recent Labs: Lab Results  Component Value Date   WBC 4.0 04/28/2018   HGB 12.2 04/28/2018   PLT 183 04/28/2018   NA 141 04/28/2018   K 4.2 04/28/2018   CL 105 04/28/2018   CO2 31 04/28/2018   GLUCOSE 46 (L) 04/28/2018   BUN 21 04/28/2018   CREATININE 0.87 04/28/2018   BILITOT 0.4 04/28/2018   ALKPHOS 56 10/25/2016   AST 14 04/28/2018   ALT 9 04/28/2018   PROT 6.3 04/28/2018   ALBUMIN 4.5 10/25/2016   CALCIUM 9.1 04/28/2018   GFRAA 85 04/28/2018    Speciality Comments: No specialty comments available.  Procedures:  No procedures performed Allergies: Patient has no known allergies.   Assessment / Plan:      Visit Diagnoses: Primary osteoarthritis of both hands -she has severe CMC arthritis with subluxation.  Joint protection was discussed.  Primary osteoarthritis of both knees - Left total knee replacement 2012 -she continues to have discomfort in her left knee joint.  Sicca syndrome, unspecified ([facility]) -over-the-counter products were discussed and have been helpful.  DDD (degenerative disc disease), cervical - Status post fusion. -She has limited range of motion without discomfort.  DDD (degenerative disc disease), lumbar - Status post fusion. -She continues to have some lumbar discomfort.  DDD (degenerative disc disease), thoracic - Status post fusion.  She is currently not having much thoracic pain.  Ehlers-Danlos syndrome type III - Followed up by Dr. Darrick Pennafields. She has Ehlers-Danlos syndrome and hypermobility  Primary insomnia - Ambien 5 mg p.o. nightly .  Prescription refill for 90-day supply with no refills was given.  Other fatigue -due to chronic insomnia.  History of sleep apnea - on CPAP   Medication monitoring encounter -patient has been taking Mobic.  Plan: CBC with Differential/Platelet, COMPLETE METABOLIC PANEL WITH GFR,   Orders: Orders Placed This Encounter  Procedures  . CBC with Differential/Platelet  . COMPLETE METABOLIC PANEL WITH GFR   Meds ordered this encounter  Medications  . zolpidem (AMBIEN) 5 MG tablet    Sig: Take 1 tablet (5 mg total) by mouth at bedtime as needed. for sleep    Dispense:  90 tablet    Refill:  0    Not to exceed 5 additional fills before 03/30/2019  . methocarbamol (ROBAXIN) 750 MG tablet    Sig: TAKE 1 TABLET BY MOUTH EVERYDAY AT BEDTIME    Dispense:  90 tablet    Refill:  1      Follow-Up Instructions: Return in about 6 months (around 08/12/2019) for Osteoarthritis, DDD.   Pollyann SavoyShaili Gift Rueckert, MD  Note - This record has been created using Animal nutritionistDragon software.  Chart creation errors have been sought, but may not always  have been  located. Such creation errors do not reflect on  the standard of medical care.

## 2019-02-05 ENCOUNTER — Ambulatory Visit: Payer: Self-pay | Admitting: Rheumatology

## 2019-02-09 ENCOUNTER — Other Ambulatory Visit: Payer: Self-pay

## 2019-02-09 ENCOUNTER — Ambulatory Visit (INDEPENDENT_AMBULATORY_CARE_PROVIDER_SITE_OTHER): Payer: BC Managed Care – PPO | Admitting: Rheumatology

## 2019-02-09 ENCOUNTER — Encounter: Payer: Self-pay | Admitting: Rheumatology

## 2019-02-09 VITALS — BP 118/65 | HR 68 | Resp 14 | Ht 65.5 in | Wt 131.0 lb

## 2019-02-09 DIAGNOSIS — M3500 Sicca syndrome, unspecified: Secondary | ICD-10-CM | POA: Diagnosis not present

## 2019-02-09 DIAGNOSIS — M19041 Primary osteoarthritis, right hand: Secondary | ICD-10-CM | POA: Diagnosis not present

## 2019-02-09 DIAGNOSIS — M170 Bilateral primary osteoarthritis of knee: Secondary | ICD-10-CM

## 2019-02-09 DIAGNOSIS — M5030 Other cervical disc degeneration, unspecified cervical region: Secondary | ICD-10-CM

## 2019-02-09 DIAGNOSIS — Z8669 Personal history of other diseases of the nervous system and sense organs: Secondary | ICD-10-CM

## 2019-02-09 DIAGNOSIS — R5383 Other fatigue: Secondary | ICD-10-CM

## 2019-02-09 DIAGNOSIS — M19042 Primary osteoarthritis, left hand: Secondary | ICD-10-CM

## 2019-02-09 DIAGNOSIS — F5101 Primary insomnia: Secondary | ICD-10-CM

## 2019-02-09 DIAGNOSIS — M5136 Other intervertebral disc degeneration, lumbar region: Secondary | ICD-10-CM

## 2019-02-09 DIAGNOSIS — Z5181 Encounter for therapeutic drug level monitoring: Secondary | ICD-10-CM

## 2019-02-09 DIAGNOSIS — M5134 Other intervertebral disc degeneration, thoracic region: Secondary | ICD-10-CM

## 2019-02-09 DIAGNOSIS — Q7962 Hypermobile Ehlers-Danlos syndrome: Secondary | ICD-10-CM

## 2019-02-09 MED ORDER — ZOLPIDEM TARTRATE 5 MG PO TABS: 5.0000 mg | ORAL_TABLET | Freq: Every evening | ORAL | 0 refills | Status: DC | PRN

## 2019-02-09 MED ORDER — METHOCARBAMOL 750 MG PO TABS: ORAL_TABLET | ORAL | 1 refills | Status: DC

## 2019-02-10 LAB — CBC WITH DIFFERENTIAL/PLATELET
Absolute Monocytes: 330 cells/uL (ref 200–950)
Basophils Absolute: 22 cells/uL (ref 0–200)
Basophils Relative: 0.5 %
Eosinophils Absolute: 79 cells/uL (ref 15–500)
Eosinophils Relative: 1.8 %
HCT: 35.3 % (ref 35.0–45.0)
Hemoglobin: 11.8 g/dL (ref 11.7–15.5)
Lymphs Abs: 1285 cells/uL (ref 850–3900)
MCH: 29.2 pg (ref 27.0–33.0)
MCHC: 33.4 g/dL (ref 32.0–36.0)
MCV: 87.4 fL (ref 80.0–100.0)
MPV: 10.7 fL (ref 7.5–12.5)
Monocytes Relative: 7.5 %
Neutro Abs: 2684 cells/uL (ref 1500–7800)
Neutrophils Relative %: 61 %
Platelets: 176 10*3/uL (ref 140–400)
RBC: 4.04 10*6/uL (ref 3.80–5.10)
RDW: 12.7 % (ref 11.0–15.0)
Total Lymphocyte: 29.2 %
WBC: 4.4 10*3/uL (ref 3.8–10.8)

## 2019-02-10 LAB — COMPLETE METABOLIC PANEL WITH GFR
AG Ratio: 2.3 (calc) (ref 1.0–2.5)
ALT: 12 U/L (ref 6–29)
AST: 18 U/L (ref 10–35)
Albumin: 4.3 g/dL (ref 3.6–5.1)
Alkaline phosphatase (APISO): 50 U/L (ref 37–153)
BUN: 23 mg/dL (ref 7–25)
CO2: 28 mmol/L (ref 20–32)
Calcium: 9.1 mg/dL (ref 8.6–10.4)
Chloride: 105 mmol/L (ref 98–110)
Creat: 0.84 mg/dL (ref 0.50–0.99)
GFR, Est African American: 88 mL/min/{1.73_m2} (ref >=60)
GFR, Est Non African American: 76 mL/min/{1.73_m2} (ref >=60)
Globulin: 1.9 g/dL (calc) (ref 1.9–3.7)
Glucose, Bld: 89 mg/dL (ref 65–99)
Potassium: 4.1 mmol/L (ref 3.5–5.3)
Sodium: 142 mmol/L (ref 135–146)
Total Bilirubin: 0.4 mg/dL (ref 0.2–1.2)
Total Protein: 6.2 g/dL (ref 6.1–8.1)

## 2019-02-10 NOTE — Progress Notes (Signed)
WNls

## 2019-02-19 ENCOUNTER — Other Ambulatory Visit: Payer: Self-pay

## 2019-02-19 ENCOUNTER — Ambulatory Visit (INDEPENDENT_AMBULATORY_CARE_PROVIDER_SITE_OTHER): Payer: BC Managed Care – PPO | Admitting: Sports Medicine

## 2019-02-19 DIAGNOSIS — G8929 Other chronic pain: Secondary | ICD-10-CM

## 2019-02-19 DIAGNOSIS — M25511 Pain in right shoulder: Secondary | ICD-10-CM | POA: Diagnosis not present

## 2019-02-19 NOTE — Progress Notes (Signed)
PCP: Crist Infante, MD  Subjective:   HPI: Patient is a 58 y.o. female with history of EDS here for follow up of right shoulder pain. She was seen more than 3 months ago and has been using Nitroglycerin patches to the right shoulder and her pain is almost completely gone. She has also tried dry needling and warm compresses to relieve back pain. She has also been doing piliates twice weekly. She only has a little pain with reaching over head.   ROS Otherwise she denies fevers, headaches, vision changes, chest pain, shortness of breath, abdominal pain, and focal weaknesses.   Past Medical History:  Diagnosis Date  . Anemia   . Arthritis    osteo  . Complication of anesthesia    urinary retention requiring indwelling  foley catheterization  . DDD (degenerative disc disease)   . Ehlers-Danlos syndrome type III   . Fibromyalgia   . History of blood transfusion   . Osteoarthritis    Current Outpatient Medications on File Prior to Visit  Medication Sig Dispense Refill  . Calcium Carbonate-Vitamin D (CALTRATE 600+D PO) Take by mouth daily.    . Cholecalciferol (VITAMIN D3) 2000 units capsule Take 2,000 Units by mouth daily.    Marland Kitchen gabapentin (NEURONTIN) 300 MG capsule TAKE TWO TABS AT BEDTIME FOR NERVE PAIN 60 capsule 0  . meloxicam (MOBIC) 15 MG tablet TAKE 1 TABLET BY MOUTH TWICE A DAY AS NEEDED 60 tablet 0  . methocarbamol (ROBAXIN) 750 MG tablet TAKE 1 TABLET BY MOUTH EVERYDAY AT BEDTIME 90 tablet 1  . zolpidem (AMBIEN) 5 MG tablet Take 1 tablet (5 mg total) by mouth at bedtime as needed. for sleep 90 tablet 0   No current facility-administered medications on file prior to visit.     Past Surgical History:  Procedure Laterality Date  . BACK SURGERY     Cervical fusion C5-7, fusion  T10-S1  . DIAGNOSTIC LAPAROSCOPY     x 3  . EYE SURGERY     bilateral tear duct fusion  . JOINT REPLACEMENT     partial left knee 2005, total left knee 2012  . KNEE ARTHROPLASTY    . KNEE ARTHROSCOPY   08/06/2012   Procedure: ARTHROSCOPY KNEE;  Surgeon: Gearlean Alf, MD;  Location: [facility];  Service: Orthopedics;  Laterality: Left;  WITH SYNOVECTOMY and debridement  . OSTEOTOMY     bilateral tibial    No Known Allergies  Social History   Socioeconomic History  . Marital status: Married    Spouse name: Scheryl Darter  . Number of children: 3  . Years of education: BA  . Highest education level: Not on file  Occupational History    Employer: [employer]    Comment: [employer]   Social Needs  . Financial resource strain: Not on file  . Food insecurity    Worry: Not on file    Inability: Not on file  . Transportation needs    Medical: Not on file    Non-medical: Not on file  Tobacco Use  . Smoking status: Never Smoker  . Smokeless tobacco: Never Used  Substance and Sexual Activity  . Alcohol use: Yes    Comment: 3 glasses of wine weekly  . Drug use: No  . Sexual activity: Not on file  Lifestyle  . Physical activity    Days per week: Not on file    Minutes per session: Not on file  . Stress: Not on file  Relationships  .  Social Musicianconnections    Talks on phone: Not on file    Gets together: Not on file    Attends religious service: Not on file    Active member of club or organization: Not on file    Attends meetings of clubs or organizations: Not on file    Relationship status: Not on file  . Intimate partner violence    Fear of current or ex partner: Not on file    Emotionally abused: Not on file    Physically abused: Not on file    Forced sexual activity: Not on file  Other Topics Concern  . Not on file  Social History Narrative   Patient lives at home with family.   Caffeine Use: 4-5 sodas weekly    Family History  Problem Relation Age of Onset  . Stroke Mother        hemorrhagic  . Hypertension Father   . Cancer - Prostate Father   . Atrial fibrillation Father   . Congestive Heart Failure Father   . Anorexia nervosa Sister   . Heart attack  Maternal Grandmother   . Stroke Maternal Grandfather   . Cancer - Colon Paternal Grandmother   . Cancer - Colon Paternal Grandfather   . Healthy Daughter   . Healthy Daughter   . Healthy Daughter     BP 126/72   Ht 5\' 5"  (1.651 m)   Wt 125 lb (56.7 kg)   BMI 20.80 kg/m   Review of Systems: See HPI above.     Objective:  Physical Exam:  Gen: NAD, comfortable in exam room Respiratory: normal work of breathing, speaking in complete sentences Left shoulder: -Inspection: no obvious deformity, atrophy, or asymmetry. No bruising. No swelling. Normal scapular function observed. -Palpation: no TTP over [facility] joint or bicipital groove. -ROM: full ROM in flexion, abduction, internal/external rotation, NV intact distally -Strength/Special Tests:  --- Supraspinatous: Negative empty can.  5/5 strength with resisted flexion --- Infraspinatous/Teres Minor: 5/5 strength with ER --- Subscapularis: 5/5 strength with IR --- No painful arc and no drop arm sign  Right shoulder: -Inspection: no obvious deformity, atrophy, or asymmetry. No bruising. No swelling. Normal scapular function observed. -Palpation: no TTP over [facility] joint or bicipital groove. -ROM: Full ROM in flexion, abduction, internal/external rotation, NV intact distally -Special Tests:  --- Supraspinatous: Negative empty can.  5/5 strength with resisted flexion --- Infraspinatous/Teres Minor: 5/5 strength with ER --- Subscapularis: 5/5 strength with IR --- No painful arc and no drop arm sign   Assessment & Plan:  1. Right shoulder pain: improved with nitroglycerin patches, piliates and home strengthening exercises -Patient can continue management as she had been doing -Encouraged to limit dry needling as to reduce risk of infection given the amount of hardware in her back -Can follow up as needed  Peggyann ShoalsHannah Don Tiu, DO [facility], PGY-2 02/19/2019 4:27 PM   I observed and examined the patient with the resident and  agree with assessment and plan.  Note reviewed and modified by me. Sterling BigKB Fields, MD

## 2019-02-19 NOTE — Assessment & Plan Note (Signed)
Improved with nitroglycerin patches, piliates and home strengthening exercises -Patient can continue management as she had been doing -Encouraged to avoid dry needling as to reduce risk of infection given the amount of hardware in her back -Can follow up as needed

## 2019-02-23 ENCOUNTER — Ambulatory Visit
Admission: RE | Admit: 2019-02-23 | Discharge: 2019-02-23 | Disposition: A | Discharge status: Home / Self Care | Payer: BC Managed Care – PPO | Source: Ambulatory Visit | Attending: Obstetrics and Gynecology | Admitting: Obstetrics and Gynecology

## 2019-02-23 ENCOUNTER — Other Ambulatory Visit: Payer: Self-pay

## 2019-02-23 DIAGNOSIS — Z1231 Encounter for screening mammogram for malignant neoplasm of breast: Secondary | ICD-10-CM

## 2019-02-24 ENCOUNTER — Ambulatory Visit: Payer: BLUE CROSS/BLUE SHIELD | Admitting: Sports Medicine

## 2019-02-25 DIAGNOSIS — Z23 Encounter for immunization: Secondary | ICD-10-CM | POA: Diagnosis not present

## 2019-02-25 DIAGNOSIS — E7849 Other hyperlipidemia: Secondary | ICD-10-CM | POA: Diagnosis not present

## 2019-02-25 DIAGNOSIS — L568 Other specified acute skin changes due to ultraviolet radiation: Secondary | ICD-10-CM | POA: Diagnosis not present

## 2019-02-25 DIAGNOSIS — Z0000 Encounter for general adult medical examination without abnormal findings: Secondary | ICD-10-CM | POA: Diagnosis not present

## 2019-02-25 DIAGNOSIS — D485 Neoplasm of uncertain behavior of skin: Secondary | ICD-10-CM | POA: Diagnosis not present

## 2019-02-25 DIAGNOSIS — M859 Disorder of bone density and structure, unspecified: Secondary | ICD-10-CM | POA: Diagnosis not present

## 2019-02-25 DIAGNOSIS — R5383 Other fatigue: Secondary | ICD-10-CM | POA: Diagnosis not present

## 2019-02-25 DIAGNOSIS — L718 Other rosacea: Secondary | ICD-10-CM | POA: Diagnosis not present

## 2019-02-25 DIAGNOSIS — R7989 Other specified abnormal findings of blood chemistry: Secondary | ICD-10-CM | POA: Diagnosis not present

## 2019-02-25 DIAGNOSIS — L819 Disorder of pigmentation, unspecified: Secondary | ICD-10-CM | POA: Diagnosis not present

## 2019-02-27 DIAGNOSIS — H04122 Dry eye syndrome of left lacrimal gland: Secondary | ICD-10-CM | POA: Diagnosis not present

## 2019-02-27 DIAGNOSIS — H5213 Myopia, bilateral: Secondary | ICD-10-CM | POA: Diagnosis not present

## 2019-02-27 DIAGNOSIS — H1859 Other hereditary corneal dystrophies: Secondary | ICD-10-CM | POA: Diagnosis not present

## 2019-03-05 DIAGNOSIS — M546 Pain in thoracic spine: Secondary | ICD-10-CM | POA: Diagnosis not present

## 2019-03-05 DIAGNOSIS — Z1331 Encounter for screening for depression: Secondary | ICD-10-CM | POA: Diagnosis not present

## 2019-03-05 DIAGNOSIS — E785 Hyperlipidemia, unspecified: Secondary | ICD-10-CM | POA: Diagnosis not present

## 2019-03-05 DIAGNOSIS — G4733 Obstructive sleep apnea (adult) (pediatric): Secondary | ICD-10-CM | POA: Diagnosis not present

## 2019-03-05 DIAGNOSIS — Z0000 Encounter for general adult medical examination without abnormal findings: Secondary | ICD-10-CM | POA: Diagnosis not present

## 2019-03-05 DIAGNOSIS — M797 Fibromyalgia: Secondary | ICD-10-CM | POA: Diagnosis not present

## 2019-03-07 DIAGNOSIS — L259 Unspecified contact dermatitis, unspecified cause: Secondary | ICD-10-CM | POA: Diagnosis not present

## 2019-03-07 DIAGNOSIS — L0390 Cellulitis, unspecified: Secondary | ICD-10-CM | POA: Diagnosis not present

## 2019-03-12 ENCOUNTER — Other Ambulatory Visit: Payer: Self-pay | Admitting: Sports Medicine

## 2019-03-31 ENCOUNTER — Other Ambulatory Visit: Payer: Self-pay | Admitting: Sports Medicine

## 2019-04-03 ENCOUNTER — Other Ambulatory Visit: Payer: Self-pay

## 2019-04-14 DIAGNOSIS — Z92241 Personal history of systemic steroid therapy: Secondary | ICD-10-CM | POA: Diagnosis not present

## 2019-04-14 DIAGNOSIS — E162 Hypoglycemia, unspecified: Secondary | ICD-10-CM | POA: Diagnosis not present

## 2019-04-14 DIAGNOSIS — Z8349 Family history of other endocrine, nutritional and metabolic diseases: Secondary | ICD-10-CM | POA: Diagnosis not present

## 2019-04-14 DIAGNOSIS — R5383 Other fatigue: Secondary | ICD-10-CM | POA: Diagnosis not present

## 2019-04-21 DIAGNOSIS — G4733 Obstructive sleep apnea (adult) (pediatric): Secondary | ICD-10-CM | POA: Diagnosis not present

## 2019-04-22 DIAGNOSIS — Z92241 Personal history of systemic steroid therapy: Secondary | ICD-10-CM | POA: Diagnosis not present

## 2019-04-22 DIAGNOSIS — R5383 Other fatigue: Secondary | ICD-10-CM | POA: Diagnosis not present

## 2019-04-22 DIAGNOSIS — E162 Hypoglycemia, unspecified: Secondary | ICD-10-CM | POA: Diagnosis not present

## 2019-04-29 DIAGNOSIS — Z23 Encounter for immunization: Secondary | ICD-10-CM | POA: Diagnosis not present

## 2019-05-01 ENCOUNTER — Other Ambulatory Visit: Payer: Self-pay | Admitting: Sports Medicine

## 2019-05-01 ENCOUNTER — Other Ambulatory Visit: Payer: Self-pay | Admitting: Rheumatology

## 2019-05-04 NOTE — Telephone Encounter (Signed)
Please schedule patient for a follow up visit. Patient due February 2021. Thanks!  

## 2019-05-04 NOTE — Telephone Encounter (Signed)
Last Visit: 02/09/19  Next Visit due February 2021. Message sent to the front to schedule. Labs: 02/09/19 WNL  Okay to refill per Dr. Estanislado Pandy

## 2019-05-07 DIAGNOSIS — R82998 Other abnormal findings in urine: Secondary | ICD-10-CM | POA: Diagnosis not present

## 2019-05-12 ENCOUNTER — Telehealth: Payer: Self-pay | Admitting: Rheumatology

## 2019-05-12 ENCOUNTER — Other Ambulatory Visit: Payer: Self-pay | Admitting: Rheumatology

## 2019-05-12 DIAGNOSIS — Z1212 Encounter for screening for malignant neoplasm of rectum: Secondary | ICD-10-CM | POA: Diagnosis not present

## 2019-05-12 NOTE — Telephone Encounter (Signed)
Last Visit: 02/09/19  Next Visit: 09/01/19  Okay to refill Ambien?

## 2019-05-12 NOTE — Telephone Encounter (Signed)
Prescription sent to the pharmacy.

## 2019-05-12 NOTE — Telephone Encounter (Signed)
Patient called requesting prescription refill of Zolpidem to be sent to [facility] at [address]. 

## 2019-05-18 ENCOUNTER — Emergency Department (HOSPITAL_COMMUNITY): Payer: BC Managed Care – PPO

## 2019-05-18 ENCOUNTER — Encounter (HOSPITAL_COMMUNITY): Payer: Self-pay

## 2019-05-18 ENCOUNTER — Observation Stay (HOSPITAL_COMMUNITY)
Admission: EM | Admit: 2019-05-18 | Discharge: 2019-05-19 | Disposition: A | Discharge status: Home / Self Care | Payer: BC Managed Care – PPO | Attending: Internal Medicine | Admitting: Internal Medicine

## 2019-05-18 ENCOUNTER — Other Ambulatory Visit: Payer: Self-pay

## 2019-05-18 DIAGNOSIS — I10 Essential (primary) hypertension: Secondary | ICD-10-CM | POA: Diagnosis not present

## 2019-05-18 DIAGNOSIS — Z8249 Family history of ischemic heart disease and other diseases of the circulatory system: Secondary | ICD-10-CM | POA: Diagnosis not present

## 2019-05-18 DIAGNOSIS — I081 Rheumatic disorders of both mitral and tricuspid valves: Secondary | ICD-10-CM | POA: Insufficient documentation

## 2019-05-18 DIAGNOSIS — Z20828 Contact with and (suspected) exposure to other viral communicable diseases: Secondary | ICD-10-CM | POA: Insufficient documentation

## 2019-05-18 DIAGNOSIS — I5032 Chronic diastolic (congestive) heart failure: Secondary | ICD-10-CM | POA: Diagnosis not present

## 2019-05-18 DIAGNOSIS — Z981 Arthrodesis status: Secondary | ICD-10-CM | POA: Insufficient documentation

## 2019-05-18 DIAGNOSIS — Z79899 Other long term (current) drug therapy: Secondary | ICD-10-CM | POA: Insufficient documentation

## 2019-05-18 DIAGNOSIS — K8020 Calculus of gallbladder without cholecystitis without obstruction: Secondary | ICD-10-CM | POA: Insufficient documentation

## 2019-05-18 DIAGNOSIS — M3500 Sicca syndrome, unspecified: Secondary | ICD-10-CM | POA: Insufficient documentation

## 2019-05-18 DIAGNOSIS — I2510 Atherosclerotic heart disease of native coronary artery without angina pectoris: Secondary | ICD-10-CM | POA: Insufficient documentation

## 2019-05-18 DIAGNOSIS — M797 Fibromyalgia: Secondary | ICD-10-CM | POA: Diagnosis not present

## 2019-05-18 DIAGNOSIS — I4892 Unspecified atrial flutter: Secondary | ICD-10-CM | POA: Insufficient documentation

## 2019-05-18 DIAGNOSIS — M1990 Unspecified osteoarthritis, unspecified site: Secondary | ICD-10-CM | POA: Diagnosis not present

## 2019-05-18 DIAGNOSIS — Z791 Long term (current) use of non-steroidal anti-inflammatories (NSAID): Secondary | ICD-10-CM | POA: Diagnosis not present

## 2019-05-18 DIAGNOSIS — R072 Precordial pain: Secondary | ICD-10-CM

## 2019-05-18 DIAGNOSIS — R778 Other specified abnormalities of plasma proteins: Secondary | ICD-10-CM | POA: Diagnosis not present

## 2019-05-18 DIAGNOSIS — Q796 Ehlers-Danlos syndrome, unspecified: Secondary | ICD-10-CM | POA: Insufficient documentation

## 2019-05-18 DIAGNOSIS — E782 Mixed hyperlipidemia: Secondary | ICD-10-CM | POA: Diagnosis not present

## 2019-05-18 DIAGNOSIS — R079 Chest pain, unspecified: Principal | ICD-10-CM | POA: Insufficient documentation

## 2019-05-18 DIAGNOSIS — R0602 Shortness of breath: Secondary | ICD-10-CM | POA: Diagnosis not present

## 2019-05-18 DIAGNOSIS — R0789 Other chest pain: Secondary | ICD-10-CM | POA: Diagnosis not present

## 2019-05-18 DIAGNOSIS — R42 Dizziness and giddiness: Secondary | ICD-10-CM | POA: Diagnosis not present

## 2019-05-18 HISTORY — DX: Unspecified atrial flutter: I48.92

## 2019-05-18 LAB — BASIC METABOLIC PANEL
Anion gap: 11 (ref 5–15)
BUN: 21 mg/dL — ABNORMAL HIGH (ref 6–20)
CO2: 25 mmol/L (ref 22–32)
Calcium: 9.3 mg/dL (ref 8.9–10.3)
Chloride: 103 mmol/L (ref 98–111)
Creatinine, Ser: 0.75 mg/dL (ref 0.44–1.00)
GFR calc Af Amer: >60 mL/min (ref >60)
GFR calc non Af Amer: >60 mL/min (ref >60)
Glucose, Bld: 102 mg/dL — ABNORMAL HIGH (ref 70–99)
Potassium: 3.9 mmol/L (ref 3.5–5.1)
Sodium: 139 mmol/L (ref 135–145)

## 2019-05-18 LAB — CBC WITH DIFFERENTIAL/PLATELET
Abs Immature Granulocytes: 0.04 10*3/uL (ref 0.00–0.07)
Basophils Absolute: 0 10*3/uL (ref 0.0–0.1)
Basophils Relative: 0 %
Eosinophils Absolute: 0.1 10*3/uL (ref 0.0–0.5)
Eosinophils Relative: 1 %
HCT: 43.9 % (ref 36.0–46.0)
Hemoglobin: 14.4 g/dL (ref 12.0–15.0)
Immature Granulocytes: 1 %
Lymphocytes Relative: 13 %
Lymphs Abs: 1.1 10*3/uL (ref 0.7–4.0)
MCH: 28.7 pg (ref 26.0–34.0)
MCHC: 32.8 g/dL (ref 30.0–36.0)
MCV: 87.6 fL (ref 80.0–100.0)
Monocytes Absolute: 0.4 10*3/uL (ref 0.1–1.0)
Monocytes Relative: 5 %
Neutro Abs: 6.9 10*3/uL (ref 1.7–7.7)
Neutrophils Relative %: 80 %
Platelets: 222 10*3/uL (ref 150–400)
RBC: 5.01 MIL/uL (ref 3.87–5.11)
RDW: 12.6 % (ref 11.5–15.5)
WBC: 8.5 10*3/uL (ref 4.0–10.5)
nRBC: 0 % (ref 0.0–0.2)

## 2019-05-18 LAB — SARS CORONAVIRUS 2 (TAT 6-24 HRS): SARS Coronavirus 2: NEGATIVE

## 2019-05-18 LAB — TROPONIN I (HIGH SENSITIVITY)
Troponin I (High Sensitivity): 43 ng/L — ABNORMAL HIGH (ref <18)
Troponin I (High Sensitivity): 53 ng/L — ABNORMAL HIGH (ref <18)
Troponin I (High Sensitivity): 42 ng/L — ABNORMAL HIGH (ref <18)
Troponin I (High Sensitivity): 34 ng/L — ABNORMAL HIGH (ref <18)

## 2019-05-18 LAB — HIV ANTIBODY (ROUTINE TESTING W REFLEX): HIV Screen 4th Generation wRfx: NONREACTIVE

## 2019-05-18 MED ORDER — GABAPENTIN 300 MG PO CAPS
600.0000 mg | ORAL_CAPSULE | Freq: Every day | ORAL | Status: DC
  Administered 2019-05-18: 600 mg via ORAL
  Filled 2019-05-18: qty 2

## 2019-05-18 MED ORDER — MORPHINE SULFATE (PF) 2 MG/ML IV SOLN: 2.0000 mg | INTRAVENOUS | Status: DC | PRN

## 2019-05-18 MED ORDER — IOHEXOL 350 MG/ML SOLN
100.0000 mL | Freq: Once | INTRAVENOUS | Status: AC | PRN
  Administered 2019-05-18: 100 mL via INTRAVENOUS

## 2019-05-18 MED ORDER — ZOLPIDEM TARTRATE 5 MG PO TABS
5.0000 mg | ORAL_TABLET | Freq: Every evening | ORAL | Status: DC | PRN
  Administered 2019-05-18: 21:00:00 5 mg via ORAL
  Filled 2019-05-18: qty 1

## 2019-05-18 MED ORDER — METHOCARBAMOL 500 MG PO TABS: 500.0000 mg | ORAL_TABLET | Freq: Three times a day (TID) | ORAL | Status: DC | PRN

## 2019-05-18 MED ORDER — PROBIOTIC PO CAPS: ORAL_CAPSULE | Freq: Every day | ORAL | Status: DC

## 2019-05-18 MED ORDER — SACCHAROMYCES BOULARDII 250 MG PO CAPS
250.0000 mg | ORAL_CAPSULE | Freq: Two times a day (BID) | ORAL | Status: DC
  Administered 2019-05-19: 250 mg via ORAL
  Filled 2019-05-18: qty 1

## 2019-05-18 MED ORDER — SODIUM CHLORIDE 0.9% FLUSH
3.0000 mL | Freq: Two times a day (BID) | INTRAVENOUS | Status: DC
  Administered 2019-05-19: 3 mL via INTRAVENOUS

## 2019-05-18 MED ORDER — SODIUM CHLORIDE 0.9% FLUSH: 3.0000 mL | INTRAVENOUS | Status: DC | PRN

## 2019-05-18 MED ORDER — ONDANSETRON HCL 4 MG PO TABS: 4.0000 mg | ORAL_TABLET | Freq: Four times a day (QID) | ORAL | Status: DC | PRN

## 2019-05-18 MED ORDER — HYDROCODONE-ACETAMINOPHEN 5-325 MG PO TABS: 1.0000 | ORAL_TABLET | ORAL | Status: DC | PRN

## 2019-05-18 MED ORDER — VITAMIN D 25 MCG (1000 UNIT) PO TABS
2000.0000 [IU] | ORAL_TABLET | Freq: Every day | ORAL | Status: DC
  Administered 2019-05-19: 09:00:00 2000 [IU] via ORAL
  Filled 2019-05-18: qty 2

## 2019-05-18 MED ORDER — PSYLLIUM 95 % PO PACK
1.0000 | PACK | Freq: Every day | ORAL | Status: DC
  Administered 2019-05-19: 09:00:00 1 via ORAL
  Filled 2019-05-18: qty 1

## 2019-05-18 MED ORDER — METOPROLOL TARTRATE 25 MG PO TABS
25.0000 mg | ORAL_TABLET | Freq: Once | ORAL | Status: AC
  Administered 2019-05-18: 25 mg via ORAL
  Filled 2019-05-18: qty 1

## 2019-05-18 MED ORDER — ENOXAPARIN SODIUM 40 MG/0.4ML ~~LOC~~ SOLN
40.0000 mg | SUBCUTANEOUS | Status: DC
  Administered 2019-05-18: 40 mg via SUBCUTANEOUS
  Filled 2019-05-18: qty 0.4

## 2019-05-18 MED ORDER — ONDANSETRON HCL 4 MG/2ML IJ SOLN
4.0000 mg | Freq: Four times a day (QID) | INTRAMUSCULAR | Status: DC | PRN
  Administered 2019-05-19: 13:00:00 4 mg via INTRAVENOUS

## 2019-05-18 MED ORDER — METHOCARBAMOL 500 MG PO TABS
750.0000 mg | ORAL_TABLET | Freq: Once | ORAL | Status: AC
  Administered 2019-05-18: 750 mg via ORAL
  Filled 2019-05-18: qty 2

## 2019-05-18 MED ORDER — SODIUM CHLORIDE 0.9 % IV SOLN: 250.0000 mL | INTRAVENOUS | Status: DC | PRN

## 2019-05-18 MED ORDER — ACETAMINOPHEN 325 MG PO TABS: 650.0000 mg | ORAL_TABLET | Freq: Four times a day (QID) | ORAL | Status: DC | PRN

## 2019-05-18 MED ORDER — ACETAMINOPHEN 650 MG RE SUPP: 650.0000 mg | Freq: Four times a day (QID) | RECTAL | Status: DC | PRN

## 2019-05-18 NOTE — ED Notes (Signed)
Patient transported to CT states she feels increased pressure to midsternal chest with minimal SOB.   No diaphoresis or nausea.  NAD noted.

## 2019-05-18 NOTE — Consult Note (Addendum)
Cardiology Consultation:   Patient ID: Nicole Oneill MRN: [id number]; DOB: 1960/09/26  Admit date: 05/18/2019 Date of Consult: 05/18/2019  Primary Care Provider: [name], MD Primary Cardiologist: [name], MD   Primary Electrophysiologist:  None    Patient Profile:   Nicole Oneill is a 58 y.o. female with a hx of Sjogren syndrome, orthopedic issues and Ehlers-Danlos syndrome hypermobility type III and seen in genetics clinic who is being seen today for the evaluation of chest pain at the request of Dr Sharyon Medicus.  History of Present Illness:   Ms. Bentsen with above hx and echo in 2007 with mild MR, and trace Tricuspid inusfficiency and normal aortic root.  She had nuc study that was neg for ischemia done for chest pain.  Last echo 2015 with normal EF 60-65%, G2DD, no significant valvular abnormalities.   Remote hx of a flutter over 10 years ago.  No hx of CAD.   Today with pilates- at completion upon standing she had sudden onset midsternal CP and dizziness, she became diaphoretic - pain heavy pressure and went through to her back,  She went to her car and at that time pain into her jaws and some in Lt arm.  She was SOB.  She drove home and when almost there she had to pull over due to vomiting.  She went home and laid down then looked up heart attack on line and called EMS BP stable.  SR.   BP is usually 120/78 or so, but with pain it has been higher in past.   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  Telemetry:  Telemetry was personally reviewed and demonstrates:  SR  Hs troponin 43  Na 139, K+ 3.9, glucose 102, BUN 21, Cr 0.75  Hgb 14.4, WBC 8.5, plts 222 COVID pending   CT angio of chest :   IMPRESSION: 1. No evidence of aortic dissection or other acute abnormality of the chest, abdomen, or pelvis. 2. Cholelithiasis. No appreciable coronary artery calcifications. There are few  scattered calcifications in the thoracic aorta. Heart size is normal. No pericardial effusion. No pulmonary emboli.  PCXR IMPRESSION: No acute infiltrate. Question LEFT nipple shadow versus EKG lead or pulmonary nodule; follow-up upright PA chest radiograph with nipple markers recommended to exclude pulmonary nodule.  VS are stable to elevated 134/81 to 153/89  Heart Pathway Score:     Past Medical History:  Diagnosis Date   Anemia    Arthritis    osteo   Atrial flutter (HCC)    x 10 years ago wias diagnosed with A. Flutter while wearing a heart monitor.  No problems since.    Complication of anesthesia    urinary retention requiring indwelling  foley catheterization   DDD (degenerative disc disease)    Ehlers-Danlos syndrome type III    Fibromyalgia    History of blood transfusion    Osteoarthritis     Past Surgical History:  Procedure Laterality Date   BACK SURGERY     Cervical fusion C5-7, fusion  T10-S1   DIAGNOSTIC LAPAROSCOPY     x 3   EYE SURGERY     bilateral tear duct fusion   JOINT REPLACEMENT     partial left knee 2005, total left knee 2012   KNEE ARTHROPLASTY     KNEE ARTHROSCOPY  08/06/2012   Procedure: ARTHROSCOPY KNEE;  Surgeon:  Loanne DrillingFrank V Aluisio, MD;  Location: [facility];  Service: Orthopedics;  Laterality: Left;  WITH SYNOVECTOMY and debridement   OSTEOTOMY     bilateral tibial     Home Medications:  Prior to Admission medications   Medication Sig Start Date End Date Taking? Authorizing Provider  Calcium Carbonate-Vitamin D (CALTRATE 600+D PO) Take 1 tablet by mouth daily.    Yes [provider]  Cholecalciferol (VITAMIN D3) 2000 units capsule Take 2,000 Units by mouth daily.   Yes [provider]  doxycycline (ADOXA) 100 MG tablet Take 100 mg by mouth at bedtime as needed.  05/01/19  Yes [provider]  gabapentin (NEURONTIN) 300 MG capsule TAKE TWO TABS AT BEDTIME FOR NERVE PAIN Patient taking differently:  Take 600 mg by mouth at bedtime. for nerve pain 05/05/19  Yes Enid BaasFields, Karl, MD  meloxicam (MOBIC) 15 MG tablet TAKE 1 TABLET BY MOUTH TWICE A DAY AS NEEDED Patient taking differently: Take 7.5-15 mg by mouth at bedtime as needed for pain.  05/04/19  Yes Deveshwar, Janalyn RouseShaili, MD  methocarbamol (ROBAXIN) 750 MG tablet TAKE 1 TABLET BY MOUTH EVERYDAY AT BEDTIME Patient taking differently: Take 750 mg by mouth at bedtime.  02/09/19  Yes Deveshwar, Janalyn RouseShaili, MD  Probiotic Product (PROBIOTIC PO) Take 1 capsule by mouth daily.   Yes [provider]  Psyllium (METAMUCIL PO) Take 1 tablet by mouth daily.   Yes [provider]  zolpidem (AMBIEN) 5 MG tablet TAKE 1 TABLET BY MOUTH AT BEDTIME AS NEEDED FOR SLEEP Patient taking differently: Take 5 mg by mouth at bedtime as needed for sleep.  05/12/19  Yes Deveshwar, Janalyn RouseShaili, MD    Inpatient Medications: Scheduled Meds:  Continuous Infusions:  PRN Meds:   Allergies:   No Known Allergies  Social History:   Social History   Socioeconomic History   Marital status: Married    Spouse name: Leandra KernJoseph Lee   Number of children: 3   Years of education: BA   Highest education level: Not on file  Occupational History    Employer: [employer]    Comment: [employer]   Social Needs   Financial resource strain: Not on file   Food insecurity    Worry: Not on file    Inability: Not on file   Transportation needs    Medical: Not on file    Non-medical: Not on file  Tobacco Use   Smoking status: Never Smoker   Smokeless tobacco: Never Used  Substance and Sexual Activity   Alcohol use: Yes    Comment: 3 glasses of wine weekly   Drug use: No   Sexual activity: Not on file  Lifestyle   Physical activity    Days per week: Not on file    Minutes per session: Not on file   Stress: Not on file  Relationships   Social connections    Talks on phone: Not on file    Gets together: Not on file    Attends religious service:  Not on file    Active member of club or organization: Not on file    Attends meetings of clubs or organizations: Not on file    Relationship status: Not on file   Intimate partner violence    Fear of current or ex partner: Not on file    Emotionally abused: Not on file    Physically abused: Not on file    Forced sexual activity: Not on file  Other Topics Concern  Not on file  Social History Narrative   Patient lives at home with family.   Caffeine Use: 4-5 sodas weekly    Family History:    Family History  Problem Relation Age of Onset   Stroke Mother        hemorrhagic   Hypertension Father    Cancer - Prostate Father    Atrial fibrillation Father    Congestive Heart Failure Father    Anorexia nervosa Sister    Heart attack Maternal Grandmother    Stroke Maternal Grandfather    Cancer - Colon Paternal Grandmother    Cancer - Colon Paternal Grandfather    Healthy Daughter    Healthy Daughter    Healthy Daughter      ROS:  Please see the history of present illness.  General:no colds or fevers, no weight changes Skin:no rashes or ulcers HEENT:no blurred vision, no congestion CV:see HPI PUL:see HPI GI:no diarrhea constipation or melena, no indigestion GU:no hematuria, no dysuria MS:no joint pain, no claudication Neuro:no syncope, no lightheadedness Endo:no diabetes, no thyroid disease  All other ROS reviewed and negative.     Physical Exam/Data:   Vitals:   05/18/19 1400 05/18/19 1415 05/18/19 1430 05/18/19 1445  BP: 134/81 135/83 126/70 (!) 153/89  Pulse:  66    Resp: 20 (!) 21 19 16   Temp:      TempSrc:      SpO2:  99%    Weight:      Height:       No intake or output data in the 24 hours ending 05/18/19 1656 Last 3 Weights 05/18/2019 02/19/2019 02/09/2019  Weight (lbs) 125 lb 125 lb 131 lb  Weight (kg) 56.7 kg 56.7 kg 59.421 kg     EXAM per Dr. Meda Coffee  Body mass index is 20.18 kg/m.  General:  Well nourished, well developed, in no  acute distress HEENT: normal Lymph: no adenopathy Neck: no JVD Endocrine:  No thryomegaly Vascular: No carotid bruits; pedal pulses 2+ bilaterally   Cardiac:  normal S1, S2; RRR; no murmur gallup rub or click Lungs:  clear to auscultation bilaterally, no wheezing, rhonchi or rales  Abd: soft, nontender, no hepatomegaly  Ext: no edema Musculoskeletal:  No deformities, BUE and BLE strength normal and equal Skin: warm and dry  Neuro:  Alert and oriented X 3 MAE follows commands no focal abnormalities noted Psych:  Normal affect    Relevant CV Studies: Echo 06/22/14 Study Conclusions   - Left ventricle: The cavity size was normal. Wall thickness was  normal. Systolic function was normal. The estimated ejection  fraction was in the range of 60% to 65%. Features are consistent  with a pseudonormal left ventricular filling pattern, with  concomitant abnormal relaxation and increased filling pressure  (grade 2 diastolic dysfunction).  - Aortic valve: There was no stenosis.  - Mitral valve: There was trivial regurgitation.  - Right ventricle: The cavity size was normal. Systolic function  was normal.  - Pulmonary arteries: No complete TR doppler jet so unable to  estimate PA systolic pressure.  - Inferior vena cava: The vessel was normal in size. The  respirophasic diameter changes were in the normal range (= 50%),  consistent with normal central venous pressure.   Impressions:   - Normal LV size and systolic function, EF 89-21%. Moderate  diastolic dysfunction. Normal RV size and systolic function. No  significant valvular abnormalities. The visualized thoracic aorta  was not dilated.   Nuc 2007 low  risk scan  Laboratory Data:  High Sensitivity Troponin:   Recent Labs  Lab 05/18/19 1252 05/18/19 1526  TROPONINIHS 43* 53*     Chemistry Recent Labs  Lab 05/18/19 1252  NA 139  K 3.9  CL 103  CO2 25  GLUCOSE 102*  BUN 21*  CREATININE 0.75    CALCIUM 9.3  GFRNONAA >60  GFRAA >60  ANIONGAP 11    No results for input(s): PROT, ALBUMIN, AST, ALT, ALKPHOS, BILITOT in the last 168 hours. Hematology Recent Labs  Lab 05/18/19 1252  WBC 8.5  RBC 5.01  HGB 14.4  HCT 43.9  MCV 87.6  MCH 28.7  MCHC 32.8  RDW 12.6  PLT 222   BNPNo results for input(s): BNP, PROBNP in the last 168 hours.  DDimer No results for input(s): DDIMER in the last 168 hours.   Radiology/Studies:  Dg Chest Port 1 View  Result Date: 05/18/2019 CLINICAL DATA:  Sudden onset mid sternal chest pain, dizziness, shortness of breath, nausea, and vomiting, pain radiating to LEFT arm, history coronary artery disease, a lower stand low syndrome type 3 EXAM: PORTABLE CHEST 1 VIEW COMPARISON:  Portable exam 1304 hours compared to 01/08/2005 FINDINGS: Prior spinal fixation and cervical spine fusion. Normal heart size, mediastinal contours, and pulmonary vascularity. Lungs clear. No acute infiltrate, pleural effusion, or pneumothorax. Resolution of scoliosis since prior study. Question LEFT nipple shadow versus EKG lead or pulmonary nodule. No acute osseous findings. IMPRESSION: No acute infiltrate. Question LEFT nipple shadow versus EKG lead or pulmonary nodule; follow-up upright PA chest radiograph with nipple markers recommended to exclude pulmonary nodule. Electronically Signed   By: Ulyses Southward M.D.   On: 05/18/2019 13:22   Ct Angio Chest/abd/pel For Dissection W And/or Wo Contrast  Result Date: 05/18/2019 CLINICAL DATA:  Acute chest and back pain. Shortness of breath. Dizziness. Vomiting. EXAM: CT ANGIOGRAPHY CHEST, ABDOMEN AND PELVIS TECHNIQUE: Multidetector CT imaging through the chest, abdomen and pelvis was performed using the standard protocol during bolus administration of intravenous contrast. Multiplanar reconstructed images and MIPs were obtained and reviewed to evaluate the vascular anatomy. CONTRAST:  OMNIPAQUE IOHEXOL 350 MG/ML SOLN COMPARISON:  None.  FINDINGS: CTA CHEST FINDINGS Cardiovascular: There is no evidence of aortic dissection or thoracic aortic aneurysm. No appreciable coronary artery calcifications. There are few scattered calcifications in the thoracic aorta. Heart size is normal. No pericardial effusion. No pulmonary emboli. Mediastinum/Nodes: No enlarged mediastinal, hilar, or axillary lymph nodes. Thyroid gland, trachea, and esophagus demonstrate no significant findings. Lungs/Pleura: Lungs are clear. No pleural effusion or pneumothorax. Musculoskeletal: No acute abnormality. The patient has fusion from T4 to the sacrum. The T3-4 level is fused anteriorly by osteophytes. Review of the MIP images confirms the above findings. CTA ABDOMEN AND PELVIS FINDINGS VASCULAR Aorta: Normal caliber aorta without aneurysm, dissection, vasculitis or significant stenosis. Celiac: Patent without evidence of aneurysm, dissection, vasculitis or significant stenosis. SMA: Patent without evidence of aneurysm, dissection, vasculitis or significant stenosis. Renals: There are 2 renal arteries bilaterally with no evidence of stenosis. IMA: Patent without evidence of aneurysm, dissection, vasculitis or significant stenosis. Inflow: Patent without evidence of aneurysm, dissection, vasculitis or significant stenosis. Veins: No obvious venous abnormality within the limitations of this arterial phase study. Review of the MIP images confirms the above findings. NON-VASCULAR Hepatobiliary: Gallstones. No gallbladder wall thickening or gallbladder distention. No biliary ductal dilatation. Pancreas: Unremarkable. No pancreatic ductal dilatation or surrounding inflammatory changes. Spleen: Normal in size without focal abnormality. Adrenals/Urinary Tract: Normal  adrenal glands. 10 mm simple cyst on the lower pole of the left kidney. Kidneys are otherwise normal. Bladder is normal. Stomach/Bowel: Stomach is within normal limits. Appendix appears normal. No evidence of bowel wall  thickening, distention, or inflammatory changes. Lymphatic: No adenopathy. Reproductive: Uterus and bilateral adnexa are unremarkable. Other: No abdominal wall hernia or abnormality. No abdominopelvic ascites. Musculoskeletal: Fusion of the spine from T3 through S1. The lumbar spine is fused to the iliac bones. No acute abnormality. Review of the MIP images confirms the above findings. IMPRESSION: 1. No evidence of aortic dissection or other acute abnormality of the chest, abdomen, or pelvis. 2. Cholelithiasis. Electronically Signed   By: Francene Boyers M.D.   On: 05/18/2019 15:27    Assessment and Plan:   1. Sudden chest pain in pt with hx of Ehlers-Danlos syndrome, hypermobility type 3, and CTA neg for aortic dissection or PE.  Troponin hs 43,  Second pending.  Pain now resolved     Plan per [name]  repeat echo and plan for cardiac CTA in AM for further eval.  Follow up with Dr. Tresa Endo if testing is normal.  2. Ehlers-Danlos syndrome, multiple orthopedic surgeries.   For questions or updates, please contact [facility] Please consult [email or web address] for contact info under   Signed, Nada Boozer, NP  05/18/2019 4:56 PM   The patient was seen, examined and discussed with Nada Boozer, NP and I agree with the above.   58 y.o. female with a hx of Sjogren syndrome, orthopedic issues and Ehlers-Danlos syndrome hypermobility type III.  The patient has been followed by Dr. Tresa Endo, last seen in 2015 when she underwent echocardiography that showed LVEF 60 to 65%, grade 2 diastolic dysfunction, no significant valvular abnormality, normal size of the ascending aorta, she has remote hx of a flutter over 10 years ago.  No hx of CAD.  She is fairly active, she walks her dog daily and denies any chest pain or shortness of breath and performs limited politely given multiple spinal fusions. When she got up at the Pilates class she developed sudden onset dizziness, pressure-like midsternal chest pain associated  with shortness of breath and radiation to her jaw and back, she later developed diaphoresis and threw up.  She called EMS, she was found to be hypertensive brought to the ER, on physical exam she has no carotid bruits S1 and 2, 2 out of 6 systolic murmur at the left lower sternal border, she has normal breathing effort does not seem to be in any distress, her lungs are clear, her peripheral pulses are strong and she has no swelling. Her EKG shows sinus rhythm with no ST-T wave abnormalities unchanged from last 1 in 2015, she has poor R wave progression in the anterior leads that is being read as anterior infarct however that was present on prior echo when she had normal LVEF and no regional wall motion abnormalities.  Her labs showed normal electrolytes and creatinine, her first troponin is 43-second 153.  She underwent CT of chest abdomen to rule out dissection and PE both negative, I have reviewed it personally and it seems that her left coronary artery is normal origin, because of motion on these nongated study it is difficult to see where the right coronary artery originates but possibly from the left and there appears to be calcification in the proximal RCA, however this was studied with contrast so it would be hard to distinguish calcium from contrast.  We will continue checking EKGs  and troponins, she will be admitted we will follow up, we will plan for repeat echocardiogram to evaluate LVEF wall motion abnormalities.  We will also plan for another CT this time coronary CTA gated study in the morning.  Her baseline heart rate was 60, will order 25 mg of metoprolol to be given in the morning.  She is hypertensive in 140s to 150s, she states usually she is in 08/08/2018 range we will continue to monitor, will obtain her lipid profile, she does not have family history of premature coronary artery disease.  She has never smoked. COVID pending   Tobias Alexander, MD 05/18/2019

## 2019-05-18 NOTE — ED Notes (Signed)
Difficulty with suction to pure wick, bed required cleaning and changing.  Pt up at bedside to change gown and linens without noted increased pain or dyspnea.  Remains jovial with NAD.

## 2019-05-18 NOTE — ED Provider Notes (Signed)
  Physical Exam  BP (!) 153/89   Pulse 66   Temp 97.9 F (36.6 C) (Oral)   Resp 16   Ht 5\' 6"  (1.676 m)   Wt 56.7 kg   SpO2 99%   BMI 20.18 kg/m   Physical Exam  ED Course/Procedures     Procedures  MDM  Received patient in signout.  Chest pain well doing yoga.  Reportedly was not anything too strenuous.  History of Ehlers-Danlos.  CT scan reassuring.  EKG reassuring, however initial troponin elevated at 40.  Pain-free now.  Will admit to internal medicine with cardiology consult.       Davonna Belling, MD 05/18/19 [phone]

## 2019-05-18 NOTE — ED Triage Notes (Signed)
At pilates this AM sudden onset midsternal CP and dizziness, SOB, had to stop while driving to vomit.  States pain at the time was midsternal radiating into L jaw with diaphoresis.  No previous cardiac hx.  On arrival pt denies any pain.  Per EMS no orthostatic VS wnl and BS 111.  Currently limited SOB and "hazy" feeling but pain free, no nausea. Alert and oriented.

## 2019-05-18 NOTE — ED Notes (Signed)
ED TO INPATIENT HANDOFF REPORT  ED Nurse Name and Phone #: Kathie Rhodes RN  S Name/Age/Gender Nicole Oneill 58 y.o. female Room/Bed: 028C/028C  Code Status   Code Status: Full Code  Home/SNF/Other Home Patient oriented to: self, place, time and situation Is this baseline? Yes   Triage Complete: Triage complete  Chief Complaint CP  Triage Note At pilates this AM sudden onset midsternal CP and dizziness, SOB, had to stop while driving to vomit.  States pain at the time was midsternal radiating into L jaw with diaphoresis.  No previous cardiac hx.  On arrival pt denies any pain.  Per EMS no orthostatic VS wnl and BS 111.  Currently limited SOB and "hazy" feeling but pain free, no nausea. Alert and oriented.    Allergies No Known Allergies  Level of Care/Admitting Diagnosis ED Disposition    ED Disposition Condition [facility] Area: [place] [[id number]]  Level of Care: Telemetry Cardiac [103]  I expect the patient will be discharged within 24 hours: Yes  LOW acuity---Tx typically complete <24 hrs---ACUTE conditions typically can be evaluated <24 hours---LABS likely to return to acceptable levels <24 hours---IS near functional baseline---EXPECTED to return to current living arrangement---NOT newly hypoxic: Meets criteria for 5C-Observation unit  Covid Evaluation: Confirmed COVID Positive  Diagnosis: Chest pain [833825]  Admitting Physician: Laren Everts, [place] Marshal.Browner  Attending Physician: Laren Everts, ALI Marshal.Browner  PT Class (Do Not Modify): Observation [104]  PT Acc Code (Do Not Modify): Observation [10022]       B Medical/Surgery History Past Medical History:  Diagnosis Date  . Anemia   . Arthritis    osteo  . Atrial flutter ([place])    x 10 years ago wias diagnosed with A. Flutter while wearing a heart monitor.  No problems since.   . Complication of anesthesia    urinary retention requiring indwelling  foley catheterization  . DDD (degenerative disc  disease)   . Ehlers-Danlos syndrome type III   . Fibromyalgia   . History of blood transfusion   . Osteoarthritis    Past Surgical History:  Procedure Laterality Date  . BACK SURGERY     Cervical fusion C5-7, fusion  T10-S1  . DIAGNOSTIC LAPAROSCOPY     x 3  . EYE SURGERY     bilateral tear duct fusion  . JOINT REPLACEMENT     partial left knee 2005, total left knee 2012  . KNEE ARTHROPLASTY    . KNEE ARTHROSCOPY  08/06/2012   Procedure: ARTHROSCOPY KNEE;  Surgeon: Gearlean Alf, MD;  Location: [facility];  Service: Orthopedics;  Laterality: Left;  WITH SYNOVECTOMY and debridement  . OSTEOTOMY     bilateral tibial     A IV Location/Drains/Wounds Patient Lines/Drains/Airways Status   Active Line/Drains/Airways    Name:   Placement date:   Placement time:   Site:   Days:   Peripheral IV 05/18/19 Anterior;Distal;Left;Upper Arm   05/18/19    1144    Arm   less than 1   Urethral Catheter Latex 14 Fr.   08/06/12    0855    Latex   2476   External Urinary Catheter   05/18/19    1533    -   less than 1   Incision 08/06/12 Knee Left   08/06/12    0928     2476          Intake/Output Last 24 hours No intake or output data  in the 24 hours ending 05/18/19 1926  Labs/Imaging Results for orders placed or performed during the hospital encounter of 05/18/19 (from the past 48 hour(s))  CBC with Differential     Status: None   Collection Time: 05/18/19 12:52 PM  Result Value Ref Range   WBC 8.5 4.0 - 10.5 K/uL   RBC 5.01 3.87 - 5.11 MIL/uL   Hemoglobin 14.4 12.0 - 15.0 g/dL   HCT 40.9 81.1 - 91.4 %   MCV 87.6 80.0 - 100.0 fL   MCH 28.7 26.0 - 34.0 pg   MCHC 32.8 30.0 - 36.0 g/dL   RDW 78.2 95.6 - 21.3 %   Platelets 222 150 - 400 K/uL   nRBC 0.0 0.0 - 0.2 %   Neutrophils Relative % 80 %   Neutro Abs 6.9 1.7 - 7.7 K/uL   Lymphocytes Relative 13 %   Lymphs Abs 1.1 0.7 - 4.0 K/uL   Monocytes Relative 5 %   Monocytes Absolute 0.4 0.1 - 1.0 K/uL   Eosinophils Relative 1 %    Eosinophils Absolute 0.1 0.0 - 0.5 K/uL   Basophils Relative 0 %   Basophils Absolute 0.0 0.0 - 0.1 K/uL   Immature Granulocytes 1 %   Abs Immature Granulocytes 0.04 0.00 - 0.07 K/uL    Comment: Performed at [facility], [address], [place], [place] [address]  Basic metabolic panel     Status: Abnormal   Collection Time: 05/18/19 12:52 PM  Result Value Ref Range   Sodium 139 135 - 145 mmol/L   Potassium 3.9 3.5 - 5.1 mmol/L   Chloride 103 98 - 111 mmol/L   CO2 25 22 - 32 mmol/L   Glucose, Bld 102 (H) 70 - 99 mg/dL   BUN 21 (H) 6 - 20 mg/dL   Creatinine, Ser 8.46 0.44 - 1.00 mg/dL   Calcium 9.3 8.9 - 96.2 mg/dL   GFR calc non Af Amer >60 >60 mL/min   GFR calc Af Amer >60 >60 mL/min   Anion gap 11 5 - 15    Comment: Performed at [facility], [address], [place], [place] [address]  Troponin I (High Sensitivity)     Status: Abnormal   Collection Time: 05/18/19 12:52 PM  Result Value Ref Range   Troponin I (High Sensitivity) 43 (H) <18 ng/L    Comment: (NOTE) Elevated high sensitivity troponin I (hsTnI) values and significant  changes across serial measurements may suggest ACS but many other  chronic and acute conditions are known to elevate hsTnI results.  Refer to the "Links" section for chest pain algorithms and additional  guidance. Performed at [facility], [address] [place], [place] [address]   SARS CORONAVIRUS 2 (TAT 6-24 HRS) Nasopharyngeal Nasopharyngeal Swab     Status: None   Collection Time: 05/18/19  2:49 PM   Specimen: Nasopharyngeal Swab  Result Value Ref Range   SARS Coronavirus 2 NEGATIVE NEGATIVE    Comment: (NOTE) SARS-CoV-2 target nucleic acids are NOT DETECTED. The SARS-CoV-2 RNA is generally detectable in upper and lower respiratory specimens during the acute phase of infection. Negative results do not preclude SARS-CoV-2 infection, do not rule out co-infections with other pathogens, and should not be used as the sole  basis for treatment or other patient management decisions. Negative results must be combined with clinical observations, patient history, and epidemiological information. The expected result is Negative. Fact Sheet for Patients: HairSlick.no Fact Sheet for Healthcare Providers:  quierodirigir.com This test is not yet approved or cleared by the [place] and  has been authorized for detection and/or diagnosis of SARS-CoV-2 by FDA under an Emergency Use Authorization (EUA). This EUA will remain  in effect (meaning this test can be used) for the duration of the COVID-19 declaration under Section 56 4(b)(1) of the Act, 21 U.S.C. section 360bbb-3(b)(1), unless the authorization is terminated or revoked sooner. Performed at [facility], [address], [place], [place] [address]   Troponin I (High Sensitivity)     Status: Abnormal   Collection Time: 05/18/19  3:26 PM  Result Value Ref Range   Troponin I (High Sensitivity) 53 (H) <18 ng/L    Comment: (NOTE) Elevated high sensitivity troponin I (hsTnI) values and significant  changes across serial measurements may suggest ACS but many other  chronic and acute conditions are known to elevate hsTnI results.  Refer to the "Links" section for chest pain algorithms and additional  guidance. Performed at [facility], [address], [place], [place] [address]   Troponin I (High Sensitivity)     Status: Abnormal   Collection Time: 05/18/19  6:20 PM  Result Value Ref Range   Troponin I (High Sensitivity) 42 (H) <18 ng/L    Comment: (NOTE) Elevated high sensitivity troponin I (hsTnI) values and significant  changes across serial measurements may suggest ACS but many other  chronic and acute conditions are known to elevate hsTnI results.  Refer to the "Links" section for chest pain algorithms and additional  guidance. Performed at [facility], [address], [place], [place] [address]    Dg Chest Port 1 View  Result Date: 05/18/2019 CLINICAL DATA:  Sudden onset mid sternal chest pain, dizziness, shortness of breath, nausea, and vomiting, pain radiating to LEFT arm, history coronary artery disease, a lower stand low syndrome type 3 EXAM: PORTABLE CHEST 1 VIEW COMPARISON:  Portable exam 1304 hours compared to 01/08/2005 FINDINGS: Prior spinal fixation and cervical spine fusion. Normal heart size, mediastinal contours, and pulmonary vascularity. Lungs clear. No acute infiltrate, pleural effusion, or pneumothorax. Resolution of scoliosis since prior study. Question LEFT nipple shadow versus EKG lead or pulmonary nodule. No acute osseous findings. IMPRESSION: No acute infiltrate. Question LEFT nipple shadow versus EKG lead or pulmonary nodule; follow-up upright PA chest radiograph with nipple markers recommended to exclude pulmonary nodule. Electronically Signed   By: Ulyses Southward M.D.   On: 05/18/2019 13:22   Ct Angio Chest/abd/pel For Dissection W And/or Wo Contrast  Result Date: 05/18/2019 CLINICAL DATA:  Acute chest and back pain. Shortness of breath. Dizziness. Vomiting. EXAM: CT ANGIOGRAPHY CHEST, ABDOMEN AND PELVIS TECHNIQUE: Multidetector CT imaging through the chest, abdomen and pelvis was performed using the standard protocol during bolus administration of intravenous contrast. Multiplanar reconstructed images and MIPs were obtained and reviewed to evaluate the vascular anatomy. CONTRAST:  OMNIPAQUE IOHEXOL 350 MG/ML SOLN COMPARISON:  None. FINDINGS: CTA CHEST FINDINGS Cardiovascular: There is no evidence of aortic dissection or thoracic aortic aneurysm. No appreciable coronary artery calcifications. There are few scattered calcifications in the thoracic aorta. Heart size is normal. No pericardial effusion. No pulmonary emboli. Mediastinum/Nodes: No enlarged mediastinal, hilar, or axillary lymph nodes. Thyroid gland, trachea, and esophagus demonstrate  no significant findings. Lungs/Pleura: Lungs are clear. No pleural effusion or pneumothorax. Musculoskeletal: No acute abnormality. The patient has fusion from T4 to the sacrum. The T3-4 level is fused anteriorly by osteophytes. Review of the  MIP images confirms the above findings. CTA ABDOMEN AND PELVIS FINDINGS VASCULAR Aorta: Normal caliber aorta without aneurysm, dissection, vasculitis or significant stenosis. Celiac: Patent without evidence of aneurysm, dissection, vasculitis or significant stenosis. SMA: Patent without evidence of aneurysm, dissection, vasculitis or significant stenosis. Renals: There are 2 renal arteries bilaterally with no evidence of stenosis. IMA: Patent without evidence of aneurysm, dissection, vasculitis or significant stenosis. Inflow: Patent without evidence of aneurysm, dissection, vasculitis or significant stenosis. Veins: No obvious venous abnormality within the limitations of this arterial phase study. Review of the MIP images confirms the above findings. NON-VASCULAR Hepatobiliary: Gallstones. No gallbladder wall thickening or gallbladder distention. No biliary ductal dilatation. Pancreas: Unremarkable. No pancreatic ductal dilatation or surrounding inflammatory changes. Spleen: Normal in size without focal abnormality. Adrenals/Urinary Tract: Normal adrenal glands. 10 mm simple cyst on the lower pole of the left kidney. Kidneys are otherwise normal. Bladder is normal. Stomach/Bowel: Stomach is within normal limits. Appendix appears normal. No evidence of bowel wall thickening, distention, or inflammatory changes. Lymphatic: No adenopathy. Reproductive: Uterus and bilateral adnexa are unremarkable. Other: No abdominal wall hernia or abnormality. No abdominopelvic ascites. Musculoskeletal: Fusion of the spine from T3 through S1. The lumbar spine is fused to the iliac bones. No acute abnormality. Review of the MIP images confirms the above findings. IMPRESSION: 1. No evidence of  aortic dissection or other acute abnormality of the chest, abdomen, or pelvis. 2. Cholelithiasis. Electronically Signed   By: Francene BoyersJames  Maxwell M.D.   On: 05/18/2019 15:27    Pending Labs Unresulted Labs (From admission, onward)    Start     Ordered   05/19/19 0000  Lipid panel  Tomorrow morning,   R    Comments: Fasting    05/18/19 1758   05/18/19 1638  HIV Antibody (routine testing w rflx)  (HIV Antibody (Routine testing w reflex) panel)  Once,   STAT     05/18/19 1637          Vitals/Pain Today's Vitals   05/18/19 1645 05/18/19 1700 05/18/19 1715 05/18/19 1730  BP: (!) 141/90 (!) 151/87 (!) 143/74 134/78  Pulse: 72 79 66 69  Resp: 11 19 16 13   Temp:      TempSrc:      SpO2: 100% 100% 100% 98%  Weight:      Height:      PainSc:        Isolation Precautions No active isolations  Medications Medications  zolpidem (AMBIEN) tablet 5 mg (has no administration in time range)  gabapentin (NEURONTIN) capsule 600 mg (has no administration in time range)  enoxaparin (LOVENOX) injection 40 mg (40 mg Subcutaneous Given 05/18/19 1815)  sodium chloride flush (NS) 0.9 % injection 3 mL (has no administration in time range)  sodium chloride flush (NS) 0.9 % injection 3 mL (has no administration in time range)  0.9 %  sodium chloride infusion (has no administration in time range)  ondansetron (ZOFRAN) tablet 4 mg (has no administration in time range)    Or  ondansetron (ZOFRAN) injection 4 mg (has no administration in time range)  acetaminophen (TYLENOL) tablet 650 mg (has no administration in time range)    Or  acetaminophen (TYLENOL) suppository 650 mg (has no administration in time range)  HYDROcodone-acetaminophen (NORCO/VICODIN) 5-325 MG per tablet 1-2 tablet (has no administration in time range)  morphine 2 MG/ML injection 2 mg (has no administration in time range)  saccharomyces boulardii (FLORASTOR) capsule 250 mg (has no administration in time range)  iohexol (OMNIPAQUE) 350  MG/ML injection 100 mL (100 mLs Intravenous Contrast Given 05/18/19 1514)  metoprolol tartrate (LOPRESSOR) tablet 25 mg (25 mg Oral Given 05/18/19 1816)    Mobility walks Low fall risk   Focused Assessments Cardiac Assessment Handoff:  Cardiac Rhythm: Normal sinus rhythm No results found for: CKTOTAL, CKMB, CKMBINDEX, TROPONINI No results found for: DDIMER Does the Patient currently have chest pain? No      R Recommendations: See Admitting Provider Note  Report given to:   Additional Notes: Pt alert and oriented, no pain at this time.

## 2019-05-18 NOTE — ED Provider Notes (Signed)
[facility] Provider Note   CSN: [id number] Arrival date & time: 05/18/19  1202     History   Chief Complaint No chief complaint on file.   HPI Nicole HughesJulia G Oneill is a 58 y.o. female.     58 yo F with a chief complaint of chest pain.  Patient was at Pilates class and had started her workout and then stood up and felt a terrible pain to her chest.  She had trouble differentiating if this was in her chest or her back.  Radiated up into her jaw made her short of breath diaphoretic had made her vomit a few times.  She has some mild improvement and was able to drive herself home.  Symptoms had worsened a little bit and she looked up on the Internet and found that she match the symptoms of a heart attack.  Called 911 who came and evaluated her as well.  Denies history of MI denies hypertension hyperlipidemia diabetes smoking.  Her mother had an MI which was thought to be related to fluid overload from a hospitalization.  Patient denies history of PE or DVT denies unilateral lower extremity edema denies hemoptysis denies recent surgery immobilization or estrogen use.  Has history of cancer.  The history is provided by the patient.  Chest Pain Pain location:  Substernal area Pain quality: sharp and shooting   Pain radiates to:  Neck and upper back Pain severity:  Moderate Onset quality:  Sudden Duration:  30 minutes Timing:  Constant Progression:  Resolved Chronicity:  New Relieved by:  Nothing Worsened by:  Nothing Ineffective treatments:  None tried Associated symptoms: diaphoresis and shortness of breath   Associated symptoms: no dizziness, no fever, no headache, no nausea, no palpitations and no vomiting     Past Medical History:  Diagnosis Date   Anemia    Arthritis    osteo   Complication of anesthesia    urinary retention requiring indwelling  foley catheterization   Coronary artery disease    x 10 years ago wias diagnosed with A.  Flutter while wearing a heart monitor.  No problems since.    DDD (degenerative disc disease)    Ehlers-Danlos syndrome type III    Fibromyalgia    History of blood transfusion    Osteoarthritis     Patient Active Problem List   Diagnosis Date Noted   Right shoulder pain 02/19/2019   History of sleep apnea 04/28/2018   DDD (degenerative disc disease), cervical 04/28/2018   DDD (degenerative disc disease), lumbar 10/29/2017   Sicca syndrome, unspecified (HCC) 10/29/2017   Left anterior shoulder pain 11/27/2016   History of lumbar fusion 10/16/2016   History of fusion of cervical spine 10/16/2016   History of total knee arthroplasty, left 2012 10/16/2016   Other fatigue 10/16/2016   Primary insomnia 10/16/2016   DDD lumbar 10/15/2016   DJD (degenerative joint disease), cervical 10/15/2016   DDD (degenerative disc disease), thoracic 10/15/2016   Primary osteoarthritis of both knees 10/15/2016   Tendinopathy of right gluteus medius 05/29/2016   Chronic lumbar radiculopathy 03/08/2016   Heart murmur, systolic 06/19/2014   Disorder of SI (sacroiliac) joint 06/02/2014   Functional gait abnormality 06/02/2014   Ehlers-Danlos syndrome type III 03/10/2014   Foot arch pain 03/10/2014   Labral tear of shoulder 05/26/2013   Left knee pain 05/26/2013   Primary osteoarthritis of both hands 05/26/2013   Effusion of knee joint, left 08/05/2012   Plantar fasciitis 02/05/2011  Lateral epicondylitis of right elbow 10/12/2010   Osteoarthritis of multiple joints 10/12/2010    Past Surgical History:  Procedure Laterality Date   BACK SURGERY     Cervical fusion C5-7, fusion  T10-S1   DIAGNOSTIC LAPAROSCOPY     x 3   EYE SURGERY     bilateral tear duct fusion   JOINT REPLACEMENT     partial left knee 2005, total left knee 2012   KNEE ARTHROPLASTY     KNEE ARTHROSCOPY  08/06/2012   Procedure: ARTHROSCOPY KNEE;  Surgeon: Loanne Drilling, MD;   Location: [facility];  Service: Orthopedics;  Laterality: Left;  WITH SYNOVECTOMY and debridement   OSTEOTOMY     bilateral tibial     OB History   No obstetric history on file.      Home Medications    Prior to Admission medications   Medication Sig Start Date End Date Taking? Authorizing Provider  Calcium Carbonate-Vitamin D (CALTRATE 600+D PO) Take by mouth daily.    [provider]  Cholecalciferol (VITAMIN D3) 2000 units capsule Take 2,000 Units by mouth daily.    [provider]  gabapentin (NEURONTIN) 300 MG capsule TAKE TWO TABS AT BEDTIME FOR NERVE PAIN 05/05/19   Enid Baas, MD  meloxicam (MOBIC) 15 MG tablet TAKE 1 TABLET BY MOUTH TWICE A DAY AS NEEDED 05/04/19   Pollyann Savoy, MD  methocarbamol (ROBAXIN) 750 MG tablet TAKE 1 TABLET BY MOUTH EVERYDAY AT BEDTIME 02/09/19   Pollyann Savoy, MD  zolpidem (AMBIEN) 5 MG tablet TAKE 1 TABLET BY MOUTH AT BEDTIME AS NEEDED FOR SLEEP 05/12/19   Pollyann Savoy, MD    Family History Family History  Problem Relation Age of Onset   Stroke Mother        hemorrhagic   Hypertension Father    Cancer - Prostate Father    Atrial fibrillation Father    Congestive Heart Failure Father    Anorexia nervosa Sister    Heart attack Maternal Grandmother    Stroke Maternal Grandfather    Cancer - Colon Paternal Grandmother    Cancer - Colon Paternal Grandfather    Healthy Daughter    Healthy Daughter    Healthy Daughter     Social History Social History   Tobacco Use   Smoking status: Never Smoker   Smokeless tobacco: Never Used  Substance Use Topics   Alcohol use: Yes    Comment: 3 glasses of wine weekly   Drug use: No     Allergies   Patient has no known allergies.   Review of Systems Review of Systems  Constitutional: Positive for diaphoresis. Negative for chills and fever.  HENT: Negative for congestion and rhinorrhea.   Eyes: Negative for redness and visual disturbance.    Respiratory: Positive for shortness of breath. Negative for wheezing.   Cardiovascular: Positive for chest pain. Negative for palpitations.  Gastrointestinal: Negative for nausea and vomiting.  Genitourinary: Negative for dysuria and urgency.  Musculoskeletal: Negative for arthralgias and myalgias.  Skin: Negative for pallor and wound.  Neurological: Negative for dizziness and headaches.     Physical Exam Updated Vital Signs BP (!) 153/89    Pulse 66    Temp 97.9 F (36.6 C) (Oral)    Resp 16    Ht  (1.676 m)    Wt 56.7 kg    SpO2 99%    BMI 20.18 kg/m   Physical Exam Vitals signs and nursing note reviewed.  Constitutional:  General: She is not in acute distress.    Appearance: She is well-developed. She is not diaphoretic.  HENT:     Head: Normocephalic and atraumatic.  Eyes:     Pupils: Pupils are equal, round, and reactive to light.  Neck:     Musculoskeletal: Normal range of motion and neck supple.  Cardiovascular:     Rate and Rhythm: Normal rate and regular rhythm.     Heart sounds: No murmur. No friction rub. No gallop.      Comments: Intact radial pulses equal bilaterally  Fixed split S2 Pulmonary:     Effort: Pulmonary effort is normal.     Breath sounds: No wheezing or rales.  Abdominal:     General: There is no distension.     Palpations: Abdomen is soft.     Tenderness: There is no abdominal tenderness.  Musculoskeletal:        General: No tenderness.  Skin:    General: Skin is warm and dry.  Neurological:     Mental Status: She is alert and oriented to person, place, and time.  Psychiatric:        Behavior: Behavior normal.      ED Treatments / Results  Labs (all labs ordered are listed, but only abnormal results are displayed) Labs Reviewed  BASIC METABOLIC PANEL - Abnormal; Notable for the following components:      Result Value   Glucose, Bld 102 (*)    BUN 21 (*)    All other components within normal limits  TROPONIN I (HIGH  SENSITIVITY) - Abnormal; Notable for the following components:   Troponin I (High Sensitivity) 43 (*)    All other components within normal limits  SARS CORONAVIRUS 2 (TAT 6-24 HRS)  CBC WITH DIFFERENTIAL/PLATELET  TROPONIN I (HIGH SENSITIVITY)    EKG EKG Interpretation  Date/Time:  Monday May 18 2019 12:09:30 EST Ventricular Rate:  60 PR Interval:    QRS Duration: 117 QT Interval:  403 QTC Calculation: 403 R Axis:   81 Text Interpretation: Sinus rhythm Nonspecific intraventricular conduction delay Anteroseptal infarct, old No old tracing to compare Confirmed by Melene Plan [phone]) on 05/18/2019 12:10:57 PM   Radiology Dg Chest Port 1 View  Result Date: 05/18/2019 CLINICAL DATA:  Sudden onset mid sternal chest pain, dizziness, shortness of breath, nausea, and vomiting, pain radiating to LEFT arm, history coronary artery disease, a lower stand low syndrome type 3 EXAM: PORTABLE CHEST 1 VIEW COMPARISON:  Portable exam 1304 hours compared to 01/08/2005 FINDINGS: Prior spinal fixation and cervical spine fusion. Normal heart size, mediastinal contours, and pulmonary vascularity. Lungs clear. No acute infiltrate, pleural effusion, or pneumothorax. Resolution of scoliosis since prior study. Question LEFT nipple shadow versus EKG lead or pulmonary nodule. No acute osseous findings. IMPRESSION: No acute infiltrate. Question LEFT nipple shadow versus EKG lead or pulmonary nodule; follow-up upright PA chest radiograph with nipple markers recommended to exclude pulmonary nodule. Electronically Signed   By: Ulyses Southward M.D.   On: 05/18/2019 13:22   Ct Angio Chest/abd/pel For Dissection W And/or Wo Contrast  Result Date: 05/18/2019 CLINICAL DATA:  Acute chest and back pain. Shortness of breath. Dizziness. Vomiting. EXAM: CT ANGIOGRAPHY CHEST, ABDOMEN AND PELVIS TECHNIQUE: Multidetector CT imaging through the chest, abdomen and pelvis was performed using the standard protocol during bolus  administration of intravenous contrast. Multiplanar reconstructed images and MIPs were obtained and reviewed to evaluate the vascular anatomy. CONTRAST:  OMNIPAQUE IOHEXOL 350 MG/ML SOLN COMPARISON:  None. FINDINGS: CTA CHEST FINDINGS Cardiovascular: There is no evidence of aortic dissection or thoracic aortic aneurysm. No appreciable coronary artery calcifications. There are few scattered calcifications in the thoracic aorta. Heart size is normal. No pericardial effusion. No pulmonary emboli. Mediastinum/Nodes: No enlarged mediastinal, hilar, or axillary lymph nodes. Thyroid gland, trachea, and esophagus demonstrate no significant findings. Lungs/Pleura: Lungs are clear. No pleural effusion or pneumothorax. Musculoskeletal: No acute abnormality. The patient has fusion from T4 to the sacrum. The T3-4 level is fused anteriorly by osteophytes. Review of the MIP images confirms the above findings. CTA ABDOMEN AND PELVIS FINDINGS VASCULAR Aorta: Normal caliber aorta without aneurysm, dissection, vasculitis or significant stenosis. Celiac: Patent without evidence of aneurysm, dissection, vasculitis or significant stenosis. SMA: Patent without evidence of aneurysm, dissection, vasculitis or significant stenosis. Renals: There are 2 renal arteries bilaterally with no evidence of stenosis. IMA: Patent without evidence of aneurysm, dissection, vasculitis or significant stenosis. Inflow: Patent without evidence of aneurysm, dissection, vasculitis or significant stenosis. Veins: No obvious venous abnormality within the limitations of this arterial phase study. Review of the MIP images confirms the above findings. NON-VASCULAR Hepatobiliary: Gallstones. No gallbladder wall thickening or gallbladder distention. No biliary ductal dilatation. Pancreas: Unremarkable. No pancreatic ductal dilatation or surrounding inflammatory changes. Spleen: Normal in size without focal abnormality. Adrenals/Urinary Tract: Normal adrenal  glands. 10 mm simple cyst on the lower pole of the left kidney. Kidneys are otherwise normal. Bladder is normal. Stomach/Bowel: Stomach is within normal limits. Appendix appears normal. No evidence of bowel wall thickening, distention, or inflammatory changes. Lymphatic: No adenopathy. Reproductive: Uterus and bilateral adnexa are unremarkable. Other: No abdominal wall hernia or abnormality. No abdominopelvic ascites. Musculoskeletal: Fusion of the spine from T3 through S1. The lumbar spine is fused to the iliac bones. No acute abnormality. Review of the MIP images confirms the above findings. IMPRESSION: 1. No evidence of aortic dissection or other acute abnormality of the chest, abdomen, or pelvis. 2. Cholelithiasis. Electronically Signed   By: Lorriane Shire M.D.   On: 05/18/2019 15:27    Procedures Procedures (including critical care time)  Medications Ordered in ED Medications  iohexol (OMNIPAQUE) 350 MG/ML injection 100 mL (100 mLs Intravenous Contrast Given 05/18/19 1514)     Initial Impression / Assessment and Plan / ED Course  I have reviewed the triage vital signs and the nursing notes.  Pertinent labs & imaging results that were available during my care of the patient were reviewed by me and considered in my medical decision making (see chart for details).        58 yo F with a chief complaints of chest pain.  Her history is concerning for an acute MI as it occurred during exertion made her diaphoretic shortness of breath with radiation to her jaw.  Symptoms had essentially resolved by arrival.  She unfortunately also has a history of Ehlers-Danlos, with her having severe sudden onset pain that is difficult to tell if it was in the chest or the back this is also concerning for possible dissection.  Will obtain CT imaging.  Troponin. Likely admit.   Troponin is elevated though mildly so.  CT angiogram of the chest and pelvis is negative for acute dissection.  Will likely need  admission for concerning history with chest pain and a elevated troponin.  Patient care was signed out to Dr. Alvino Chapel, please see his note for further details of care in the ED.  CRITICAL CARE Performed by: Cecilio Asper   Total  critical care time: 35 minutes  Critical care time was exclusive of separately billable procedures and treating other patients.  Critical care was necessary to treat or prevent imminent or life-threatening deterioration.  Critical care was time spent personally by me on the following activities: development of treatment plan with patient and/or surrogate as well as nursing, discussions with consultants, evaluation of patient's response to treatment, examination of patient, obtaining history from patient or surrogate, ordering and performing treatments and interventions, ordering and review of laboratory studies, ordering and review of radiographic studies, pulse oximetry and re-evaluation of patient's condition.  The patients results and plan were reviewed and discussed.   Any x-rays performed were independently reviewed by myself.   Differential diagnosis were considered with the presenting HPI.  Medications  iohexol (OMNIPAQUE) 350 MG/ML injection 100 mL (100 mLs Intravenous Contrast Given 05/18/19 1514)    Vitals:   05/18/19 1400 05/18/19 1415 05/18/19 1430 05/18/19 1445  BP: 134/81 135/83 126/70 (!) 153/89  Pulse:  66    Resp: 20 (!) Temp:      TempSrc:      SpO2:  99%    Weight:      Height:        Final diagnoses:  Chest pain, unspecified type  Elevated troponin    Admission/ observation were discussed with the admitting physician, patient and/or family and they are comfortable with the plan.   Final Clinical Impressions(s) / ED Diagnoses   Final diagnoses:  Chest pain, unspecified type  Elevated troponin    ED Discharge Orders    None       Melene Plan, DO 05/18/19 1553

## 2019-05-18 NOTE — ED Notes (Signed)
Pt eating dinner without changes in sx.  NAD and waiting for bed placement.

## 2019-05-18 NOTE — H&P (Signed)
[facility]                                                                                    Patient Demographics  Nicole Oneill, is a 58 y.o. female  CSN: [id number]  MRN: [id number]  DOB - [birth year]  Admit Date - 05/18/2019  Outpatient Primary MD for the patient is Crist Infante, MD   With History of -  Past Medical History:  Diagnosis Date  . Anemia   . Arthritis    osteo  . Complication of anesthesia    urinary retention requiring indwelling  foley catheterization  . Coronary artery disease    x 10 years ago wias diagnosed with A. Flutter while wearing a heart monitor.  No problems since.   . DDD (degenerative disc disease)   . Ehlers-Danlos syndrome type III   . Fibromyalgia   . History of blood transfusion   . Osteoarthritis       Past Surgical History:  Procedure Laterality Date  . BACK SURGERY     Cervical fusion C5-7, fusion  T10-S1  . DIAGNOSTIC LAPAROSCOPY     x 3  . EYE SURGERY     bilateral tear duct fusion  . JOINT REPLACEMENT     partial left knee 2005, total left knee 2012  . KNEE ARTHROPLASTY    . KNEE ARTHROSCOPY  08/06/2012   Procedure: ARTHROSCOPY KNEE;  Surgeon: Gearlean Alf, MD;  Location: [facility];  Service: Orthopedics;  Laterality: Left;  WITH SYNOVECTOMY and debridement  . OSTEOTOMY     bilateral tibial    in for   Chest pain  HPI  Nicole Oneill  is a 58 y.o. female, with a past medical history significant for Ehlers-Danlos syndrome type III, arthritis presenting with an episode of acute chest pain radiating to her jaw and left arm.  This was associated with nausea and vomiting, shortness of breath and dizziness.  There was some mild improvement in her symptoms upon rest.  No history of coronary artery disease, MI, hypertension, hyperlipidemia or diabetes. Work-up in the emergency room blood work, and a benign EKG with sinus rhythm at 60 bpm, intraventricular conduction delay and no acute changes . Cardiology consult is  pending      Review of Systems    In addition to the HPI above,  No Fever-chills, No Headache, No changes with Vision or hearing, No problems swallowing food or Liquids, No Abdominal pain, Bowel movements are regular, No Blood in stool or Urine, No dysuria, No new skin rashes or bruises, No new joints pains-aches,  No new weakness, tingling, numbness in any extremity, No recent weight gain or loss, No polyuria, polydypsia or polyphagia, No significant Mental Stressors.  A full 10 point Review of Systems was done, except as stated above, all other Review of Systems were negative.   Social History Social History   Tobacco Use  . Smoking status: Never Smoker  . Smokeless tobacco: Never Used  Substance Use Topics  . Alcohol use: Yes    Comment: 3 glasses of wine weekly     Family History Family History  Problem Relation Age of Onset  .  Stroke Mother        hemorrhagic  . Hypertension Father   . Cancer - Prostate Father   . Atrial fibrillation Father   . Congestive Heart Failure Father   . Anorexia nervosa Sister   . Heart attack Maternal Grandmother   . Stroke Maternal Grandfather   . Cancer - Colon Paternal Grandmother   . Cancer - Colon Paternal Grandfather   . Healthy Daughter   . Healthy Daughter   . Healthy Daughter      Prior to Admission medications   Medication Sig Start Date End Date Taking? Authorizing Provider  Calcium Carbonate-Vitamin D (CALTRATE 600+D PO) Take 1 tablet by mouth daily.    Yes [provider]  Cholecalciferol (VITAMIN D3) 2000 units capsule Take 2,000 Units by mouth daily.   Yes [provider]  doxycycline (ADOXA) 100 MG tablet Take 100 mg by mouth at bedtime as needed.  05/01/19  Yes [provider]  gabapentin (NEURONTIN) 300 MG capsule TAKE TWO TABS AT BEDTIME FOR NERVE PAIN Patient taking differently: Take 600 mg by mouth at bedtime. for nerve pain 05/05/19  Yes Enid Baas, MD  meloxicam (MOBIC)  15 MG tablet TAKE 1 TABLET BY MOUTH TWICE A DAY AS NEEDED Patient taking differently: Take 7.5-15 mg by mouth at bedtime as needed for pain.  05/04/19  Yes Deveshwar, Janalyn Rouse, MD  methocarbamol (ROBAXIN) 750 MG tablet TAKE 1 TABLET BY MOUTH EVERYDAY AT BEDTIME Patient taking differently: Take 750 mg by mouth at bedtime.  02/09/19  Yes Deveshwar, Janalyn Rouse, MD  Probiotic Product (PROBIOTIC PO) Take 1 capsule by mouth daily.   Yes [provider]  Psyllium (METAMUCIL PO) Take 1 tablet by mouth daily.   Yes [provider]  zolpidem (AMBIEN) 5 MG tablet TAKE 1 TABLET BY MOUTH AT BEDTIME AS NEEDED FOR SLEEP Patient taking differently: Take 5 mg by mouth at bedtime as needed for sleep.  05/12/19  Yes Deveshwar, Janalyn Rouse, MD    No Known Allergies  Physical Exam  Vitals  Blood pressure (!) 153/89, pulse 66, temperature 97.9 F (36.6 C), temperature source Oral, resp. rate 16, height  (1.676 m), weight 56.7 kg, SpO2 99 %.  General appearance, no acute distress, alert awake oriented x3 HEENT no jaundice or pallor, no facial deviation oral thrush Neck supple, no neck vein distention Chest clear and resonant Heart normal S1-S2, no murmurs gallops or rubs Abdomen soft, nontender, bowel sounds are present Extremities no clubbing cyanosis or edema Neuro grossly nonfocal Skin no rashes or ulcers   Data Review  CBC Recent Labs  Lab 05/18/19 1252  WBC 8.5  HGB 14.4  HCT 43.9  PLT 222  MCV 87.6  MCH 28.7  MCHC 32.8  RDW 12.6  LYMPHSABS 1.1  MONOABS 0.4  EOSABS 0.1  BASOSABS 0.0   ------------------------------------------------------------------------------------------------------------------  Chemistries  Recent Labs  Lab 05/18/19 1252  NA 139  K 3.9  CL 103  CO2 25  GLUCOSE 102*  BUN 21*  CREATININE 0.75  CALCIUM 9.3   ------------------------------------------------------------------------------------------------------------------ estimated creatinine  clearance is 66.9 mL/min (by C-G formula based on SCr of 0.75 mg/dL). ------------------------------------------------------------------------------------------------------------------ No results for input(s): TSH, T4TOTAL, T3FREE, THYROIDAB in the last 72 hours.  Invalid input(s): FREET3   Coagulation profile No results for input(s): INR, PROTIME in the last 168 hours. ------------------------------------------------------------------------------------------------------------------- No results for input(s): DDIMER in the last 72 hours. -------------------------------------------------------------------------------------------------------------------  Cardiac Enzymes No results for input(s): CKMB, TROPONINI, MYOGLOBIN in the last 168 hours.  Invalid input(s): CK ------------------------------------------------------------------------------------------------------------------ Invalid input(s): POCBNP   ---------------------------------------------------------------------------------------------------------------  Urinalysis    Component Value Date/Time   COLORURINE YELLOW 04/26/2011 1240   APPEARANCEUR CLEAR 04/26/2011 1240   LABSPEC 1.010 04/26/2011 1240   PHURINE 7.5 04/26/2011 1240   GLUCOSEU NEGATIVE 04/26/2011 1240   HGBUR NEGATIVE 04/26/2011 1240   BILIRUBINUR NEGATIVE 04/26/2011 1240   KETONESUR NEGATIVE 04/26/2011 1240   PROTEINUR NEGATIVE 04/26/2011 1240   UROBILINOGEN 0.2 04/26/2011 1240   NITRITE NEGATIVE 04/26/2011 1240   LEUKOCYTESUR MODERATE (A) 04/26/2011 1240    ----------------------------------------------------------------------------------------------------------------   Imaging results:   Dg Chest Port 1 View  Result Date: 05/18/2019 CLINICAL DATA:  Sudden onset mid sternal chest pain, dizziness, shortness of breath, nausea, and vomiting, pain radiating to LEFT arm, history coronary artery disease, a lower stand low syndrome type 3 EXAM: PORTABLE  CHEST 1 VIEW COMPARISON:  Portable exam 1304 hours compared to 01/08/2005 FINDINGS: Prior spinal fixation and cervical spine fusion. Normal heart size, mediastinal contours, and pulmonary vascularity. Lungs clear. No acute infiltrate, pleural effusion, or pneumothorax. Resolution of scoliosis since prior study. Question LEFT nipple shadow versus EKG lead or pulmonary nodule. No acute osseous findings. IMPRESSION: No acute infiltrate. Question LEFT nipple shadow versus EKG lead or pulmonary nodule; follow-up upright PA chest radiograph with nipple markers recommended to exclude pulmonary nodule. Electronically Signed   By: Ulyses SouthwardMark  Boles M.D.   On: 05/18/2019 13:22   Ct Angio Chest/abd/pel For Dissection W And/or Wo Contrast  Result Date: 05/18/2019 CLINICAL DATA:  Acute chest and back pain. Shortness of breath. Dizziness. Vomiting. EXAM: CT ANGIOGRAPHY CHEST, ABDOMEN AND PELVIS TECHNIQUE: Multidetector CT imaging through the chest, abdomen and pelvis was performed using the standard protocol during bolus administration of intravenous contrast. Multiplanar reconstructed images and MIPs were obtained and reviewed to evaluate the vascular anatomy. CONTRAST:  100mL OMNIPAQUE IOHEXOL 350 MG/ML SOLN COMPARISON:  None. FINDINGS: CTA CHEST FINDINGS Cardiovascular: There is no evidence of aortic dissection or thoracic aortic aneurysm. No appreciable coronary artery calcifications. There are few scattered calcifications in the thoracic aorta. Heart size is normal. No pericardial effusion. No pulmonary emboli. Mediastinum/Nodes: No enlarged mediastinal, hilar, or axillary lymph nodes. Thyroid gland, trachea, and esophagus demonstrate no significant findings. Lungs/Pleura: Lungs are clear. No pleural effusion or pneumothorax. Musculoskeletal: No acute abnormality. The patient has fusion from T4 to the sacrum. The T3-4 level is fused anteriorly by osteophytes. Review of the MIP images confirms the above findings. CTA ABDOMEN  AND PELVIS FINDINGS VASCULAR Aorta: Normal caliber aorta without aneurysm, dissection, vasculitis or significant stenosis. Celiac: Patent without evidence of aneurysm, dissection, vasculitis or significant stenosis. SMA: Patent without evidence of aneurysm, dissection, vasculitis or significant stenosis. Renals: There are 2 renal arteries bilaterally with no evidence of stenosis. IMA: Patent without evidence of aneurysm, dissection, vasculitis or significant stenosis. Inflow: Patent without evidence of aneurysm, dissection, vasculitis or significant stenosis. Veins: No obvious venous abnormality within the limitations of this arterial phase study. Review of the MIP images confirms the above findings. NON-VASCULAR Hepatobiliary: Gallstones. No gallbladder wall thickening or gallbladder distention. No biliary ductal dilatation. Pancreas: Unremarkable. No pancreatic ductal dilatation or surrounding inflammatory changes. Spleen: Normal in size without focal abnormality. Adrenals/Urinary Tract: Normal adrenal glands. 10 mm simple cyst on the lower pole of the left kidney. Kidneys are otherwise normal. Bladder is normal. Stomach/Bowel: Stomach is within normal limits. Appendix appears normal. No evidence of bowel wall thickening, distention, or inflammatory changes. Lymphatic: No adenopathy. Reproductive: Uterus and bilateral adnexa  are unremarkable. Other: No abdominal wall hernia or abnormality. No abdominopelvic ascites. Musculoskeletal: Fusion of the spine from T3 through S1. The lumbar spine is fused to the iliac bones. No acute abnormality. Review of the MIP images confirms the above findings. IMPRESSION: 1. No evidence of aortic dissection or other acute abnormality of the chest, abdomen, or pelvis. 2. Cholelithiasis. Electronically Signed   By: Francene Boyers M.D.   On: 05/18/2019 15:27    My personal review of EKG: Normal sinus rhythm at 60 bpm with no acute changes, nonspecific intraventricular conduction  delay  Assessment & Plan  Chest pain No significant EKG changes except for possible old anteroseptal MI Follow serial troponins Cardiology on consult  Ehlers-Danlos syndrome stage III Chronic  History of arthritis with multiple surgeries including left knee and spine  DVT Prophylaxis Lovenox  AM Labs Ordered, also please review Full Orders  Family Communication: Discussed with husband at bedside.  Code Status full  Disposition Plan: Home  Time spent in minutes : 36 minutes  Condition GUARDED   @SIGNATURE @

## 2019-05-19 ENCOUNTER — Observation Stay (HOSPITAL_BASED_OUTPATIENT_CLINIC_OR_DEPARTMENT_OTHER): Payer: BC Managed Care – PPO

## 2019-05-19 ENCOUNTER — Observation Stay (HOSPITAL_COMMUNITY): Payer: BC Managed Care – PPO

## 2019-05-19 DIAGNOSIS — E782 Mixed hyperlipidemia: Secondary | ICD-10-CM | POA: Diagnosis not present

## 2019-05-19 DIAGNOSIS — R079 Chest pain, unspecified: Secondary | ICD-10-CM

## 2019-05-19 DIAGNOSIS — R072 Precordial pain: Secondary | ICD-10-CM | POA: Diagnosis not present

## 2019-05-19 DIAGNOSIS — R778 Other specified abnormalities of plasma proteins: Secondary | ICD-10-CM | POA: Diagnosis not present

## 2019-05-19 LAB — LIPID PANEL
Cholesterol: 221 mg/dL — ABNORMAL HIGH (ref 0–200)
HDL: 78 mg/dL (ref >40)
LDL Cholesterol: 133 mg/dL — ABNORMAL HIGH (ref 0–99)
Total CHOL/HDL Ratio: 2.8 RATIO
Triglycerides: 52 mg/dL (ref <150)
VLDL: 10 mg/dL (ref 0–40)

## 2019-05-19 LAB — ECHOCARDIOGRAM COMPLETE
Height: 66 in
Weight: 2095.25 oz

## 2019-05-19 MED ORDER — ONDANSETRON HCL 4 MG/2ML IJ SOLN
INTRAMUSCULAR | Status: AC
  Administered 2019-05-19: 13:00:00 4 mg via INTRAVENOUS
  Filled 2019-05-19: qty 2

## 2019-05-19 MED ORDER — ROSUVASTATIN CALCIUM 5 MG PO TABS: 5.0000 mg | ORAL_TABLET | ORAL | 0 refills | Status: DC

## 2019-05-19 MED ORDER — METOPROLOL TARTRATE 5 MG/5ML IV SOLN
5.0000 mg | INTRAVENOUS | Status: DC | PRN
  Administered 2019-05-19: 13:00:00 5 mg via INTRAVENOUS

## 2019-05-19 MED ORDER — NITROGLYCERIN 0.4 MG SL SUBL
0.8000 mg | SUBLINGUAL_TABLET | Freq: Once | SUBLINGUAL | Status: AC
  Administered 2019-05-19: 13:00:00 0.8 mg via SUBLINGUAL

## 2019-05-19 MED ORDER — METOPROLOL TARTRATE 5 MG/5ML IV SOLN
INTRAVENOUS | Status: AC
  Administered 2019-05-19: 5 mg via INTRAVENOUS
  Filled 2019-05-19: qty 5

## 2019-05-19 MED ORDER — IOHEXOL 350 MG/ML SOLN
80.0000 mL | Freq: Once | INTRAVENOUS | Status: AC | PRN
  Administered 2019-05-19: 13:00:00 80 mL via INTRAVENOUS

## 2019-05-19 MED ORDER — NITROGLYCERIN 0.4 MG SL SUBL
SUBLINGUAL_TABLET | SUBLINGUAL | Status: AC
  Administered 2019-05-19: 0.8 mg via SUBLINGUAL
  Filled 2019-05-19: qty 2

## 2019-05-19 NOTE — Progress Notes (Signed)
Progress Note  Patient Name: Nicole Oneill Date of Encounter: 05/19/2019  Primary Cardiologist: Shelva Majestic, MD   Subjective   Patient denies any chest pain today, denies any shortness of breath, fatigue or dizziness. Does endorse some headache.   Inpatient Medications    Scheduled Meds: . cholecalciferol  2,000 Units Oral Daily  . enoxaparin (LOVENOX) injection  40 mg Subcutaneous Q24H  . gabapentin  600 mg Oral QHS  . psyllium  1 packet Oral Daily  . saccharomyces boulardii  250 mg Oral BID  . sodium chloride flush  3 mL Intravenous Q12H   Continuous Infusions: . sodium chloride     PRN Meds: sodium chloride, acetaminophen **OR** acetaminophen, HYDROcodone-acetaminophen, morphine injection, ondansetron **OR** ondansetron (ZOFRAN) IV, sodium chloride flush, zolpidem   Vital Signs    Vitals:   05/18/19 1715 05/18/19 1730 05/18/19 2004 05/19/19 0530  BP: (!) 143/74 134/78  124/76  Pulse: 66 69  (!) 56  Resp: 16 13  15   Temp:    97.8 F (36.6 C)  TempSrc:    Oral  SpO2: 100% 98%  99%  Weight:   59.4 kg   Height:        Intake/Output Summary (Last 24 hours) at 05/19/2019 1009 Last data filed at 05/19/2019 6834 Gross per 24 hour  Intake 3 ml  Output -  Net 3 ml   Filed Weights   05/18/19 1214 05/18/19 2004  Weight: 56.7 kg 59.4 kg    Telemetry    NSR - Personally Reviewed  ECG    None today  Physical Exam   GEN: No acute distress. Middle aged female.  Neck: No JVD Cardiac: RRR, no murmurs, rubs, or gallops.  Respiratory: Clear to auscultation bilaterally. GI: Soft, nontender, non-distended  MS: No edema; No deformity. Neuro:  Nonfocal  Psych: Normal affect   Labs    Chemistry Recent Labs  Lab 05/18/19 1252  NA 139  K 3.9  CL 103  CO2 25  GLUCOSE 102*  BUN 21*  CREATININE 0.75  CALCIUM 9.3  GFRNONAA >60  GFRAA >60  ANIONGAP 11     Hematology Recent Labs  Lab 05/18/19 1252  WBC 8.5  RBC 5.01  HGB 14.4  HCT 43.9  MCV  87.6  MCH 28.7  MCHC 32.8  RDW 12.6  PLT 222    Cardiac EnzymesNo results for input(s): TROPONINI in the last 168 hours. No results for input(s): TROPIPOC in the last 168 hours.   BNPNo results for input(s): BNP, PROBNP in the last 168 hours.   DDimer No results for input(s): DDIMER in the last 168 hours.   Radiology    Dg Chest Port 1 View  Result Date: 05/18/2019 CLINICAL DATA:  Sudden onset mid sternal chest pain, dizziness, shortness of breath, nausea, and vomiting, pain radiating to LEFT arm, history coronary artery disease, a lower stand low syndrome type 3 EXAM: PORTABLE CHEST 1 VIEW COMPARISON:  Portable exam 1304 hours compared to 01/08/2005 FINDINGS: Prior spinal fixation and cervical spine fusion. Normal heart size, mediastinal contours, and pulmonary vascularity. Lungs clear. No acute infiltrate, pleural effusion, or pneumothorax. Resolution of scoliosis since prior study. Question LEFT nipple shadow versus EKG lead or pulmonary nodule. No acute osseous findings. IMPRESSION: No acute infiltrate. Question LEFT nipple shadow versus EKG lead or pulmonary nodule; follow-up upright PA chest radiograph with nipple markers recommended to exclude pulmonary nodule. Electronically Signed   By: Lavonia Dana M.D.   On: 05/18/2019 13:22  Ct Angio Chest/abd/pel For Dissection W And/or Wo Contrast  Result Date: 05/18/2019 CLINICAL DATA:  Acute chest and back pain. Shortness of breath. Dizziness. Vomiting. EXAM: CT ANGIOGRAPHY CHEST, ABDOMEN AND PELVIS TECHNIQUE: Multidetector CT imaging through the chest, abdomen and pelvis was performed using the standard protocol during bolus administration of intravenous contrast. Multiplanar reconstructed images and MIPs were obtained and reviewed to evaluate the vascular anatomy. CONTRAST:  100mL OMNIPAQUE IOHEXOL 350 MG/ML SOLN COMPARISON:  None. FINDINGS: CTA CHEST FINDINGS Cardiovascular: There is no evidence of aortic dissection or thoracic aortic  aneurysm. No appreciable coronary artery calcifications. There are few scattered calcifications in the thoracic aorta. Heart size is normal. No pericardial effusion. No pulmonary emboli. Mediastinum/Nodes: No enlarged mediastinal, hilar, or axillary lymph nodes. Thyroid gland, trachea, and esophagus demonstrate no significant findings. Lungs/Pleura: Lungs are clear. No pleural effusion or pneumothorax. Musculoskeletal: No acute abnormality. The patient has fusion from T4 to the sacrum. The T3-4 level is fused anteriorly by osteophytes. Review of the MIP images confirms the above findings. CTA ABDOMEN AND PELVIS FINDINGS VASCULAR Aorta: Normal caliber aorta without aneurysm, dissection, vasculitis or significant stenosis. Celiac: Patent without evidence of aneurysm, dissection, vasculitis or significant stenosis. SMA: Patent without evidence of aneurysm, dissection, vasculitis or significant stenosis. Renals: There are 2 renal arteries bilaterally with no evidence of stenosis. IMA: Patent without evidence of aneurysm, dissection, vasculitis or significant stenosis. Inflow: Patent without evidence of aneurysm, dissection, vasculitis or significant stenosis. Veins: No obvious venous abnormality within the limitations of this arterial phase study. Review of the MIP images confirms the above findings. NON-VASCULAR Hepatobiliary: Gallstones. No gallbladder wall thickening or gallbladder distention. No biliary ductal dilatation. Pancreas: Unremarkable. No pancreatic ductal dilatation or surrounding inflammatory changes. Spleen: Normal in size without focal abnormality. Adrenals/Urinary Tract: Normal adrenal glands. 10 mm simple cyst on the lower pole of the left kidney. Kidneys are otherwise normal. Bladder is normal. Stomach/Bowel: Stomach is within normal limits. Appendix appears normal. No evidence of bowel wall thickening, distention, or inflammatory changes. Lymphatic: No adenopathy. Reproductive: Uterus and bilateral  adnexa are unremarkable. Other: No abdominal wall hernia or abnormality. No abdominopelvic ascites. Musculoskeletal: Fusion of the spine from T3 through S1. The lumbar spine is fused to the iliac bones. No acute abnormality. Review of the MIP images confirms the above findings. IMPRESSION: 1. No evidence of aortic dissection or other acute abnormality of the chest, abdomen, or pelvis. 2. Cholelithiasis. Electronically Signed   By: Francene BoyersJames  Maxwell M.D.   On: 05/18/2019 15:27    Cardiac Studies   Echo 06/22/14 Study Conclusions   - Left ventricle: The cavity size was normal. Wall thickness was  normal. Systolic function was normal. The estimated ejection  fraction was in the range of 60% to 65%. Features are consistent  with a pseudonormal left ventricular filling pattern, with  concomitant abnormal relaxation and increased filling pressure  (grade 2 diastolic dysfunction).  - Aortic valve: There was no stenosis.  - Mitral valve: There was trivial regurgitation.  - Right ventricle: The cavity size was normal. Systolic function  was normal.  - Pulmonary arteries: No complete TR doppler jet so unable to  estimate PA systolic pressure.  - Inferior vena cava: The vessel was normal in size. The  respirophasic diameter changes were in the normal range (= 50%),  consistent with normal central venous pressure.   Impressions:   - Normal LV size and systolic function, EF 60-65%. Moderate  diastolic dysfunction. Normal RV size and systolic function.  No  significant valvular abnormalities. The visualized thoracic aorta  was not dilated.   Nuc 2007 low risk scan  Patient Profile     58 y.o. female with a history of Sjogrens syndrome, orthopedic issues, Ehler-Danlos syndrome who presented with chest pain.   Assessment & Plan    Chest pain: Resolved today, was initially typical in nature, associated with dizziness and diaphoresis. Troponin was flat at 43 > 53 > 42 >34.  EKG showed NSR with ST changes. Given history of Ehler-Danlos and chest pain that radiated to back she had a CT angio that was negative. Currently chest pain free. Still waiting for repeat echocardiogram and coronary CTA gated study today. Unclear etiology for her symptoms.  -F/u coronary CTA  -F/u echocardiogram  HTN: Currently on home medications, BP stable today. Continue current regimen.   For questions or updates, please contact [facility] Please consult [email or web address] for contact info under Cardiology/STEMI.     Signed, Claudean Severance, MD  05/19/2019, 10:09 AM    The patient was seen, examined and discussed with Claudean Severance, MD  and I agree with the above.   Patient is asymptomatic since the admission, her EKG remains normal, troponin has a very flat trend and is now downtrending, I personally reviewed her echocardiogram she has normal systolic and diastolic function, in fact she has hyperdynamic LVEF around 70% and no regional wall motion abnormalities.  Her echo is otherwise completely normal.  We will await for results of coronary CTA, her LDL is 133, will start therapy based on results of coronary CTA.  Anticipated discharge later today.  Tobias Alexander, MD 05/19/2019

## 2019-05-19 NOTE — Discharge Summary (Signed)
Physician Discharge Summary  Nicole HughesJulia G Oneill OZH:[id number] DOB: 09-Aug-1960 DOA: 05/18/2019  PCP: Rodrigo RanPerini, Mark, MD  Admit date: 05/18/2019 Discharge date: 05/19/2019  Admitted From: home Discharge disposition: home   Recommendations for Outpatient Follow-Up:   1. crestor 3x/week 2. May need referral to GI if symptoms persist   Discharge Diagnosis:   Active Problems:   Chest pain    Discharge Condition: Improved.  Diet recommendation: Low sodium, heart healthy  Wound care: None.  Code status: Full.   History of Present Illness:   Nicole SabalJulia Oneill  is a 58 y.o. female, with a past medical history significant for Ehlers-Danlos syndrome type III, arthritis presenting with an episode of acute chest pain radiating to her jaw and left arm.  This was associated with nausea and vomiting, shortness of breath and dizziness.  There was some mild improvement in her symptoms upon rest.  No history of coronary artery disease, MI, hypertension, hyperlipidemia or diabetes. Work-up in the emergency room blood work, and a benign EKG with sinus rhythm at 60 bpm, intraventricular conduction delay and no acute changes . Cardiology consult is pending   Hospital Course by Problem:   Chest pain -cardiology consult appreciated -Patient is asymptomatic since the admission -EKG remains normal -troponin has a very flat trend and is now down trending -echocardiogram:  normal systolic and diastolic function, in fact she has hyperdynamic LVEF around 70% and no regional wall motion abnormalities.   -coronary CTA: CAD-RADS 1. Minimal non-obstructive CAD (0-24%). Consider non-atherosclerotic causes of chest pain -LDL is 133: crestor 3x/week.     Medical Consultants:   cards   Discharge Exam:   Vitals:   05/19/19 0530 05/19/19 1400  BP: 124/76 107/63  Pulse: (!) 56 (!) 58  Resp: 15 18  Temp: 97.8 F (36.6 C) 98 F (36.7 C)  SpO2: 99% 100%   Vitals:   05/18/19 1730 05/18/19 2004  05/19/19 0530 05/19/19 1400  BP: 134/78  124/76 107/63  Pulse: 69  (!) 56 (!) 58  Resp: 13  15 18   Temp:   97.8 F (36.6 C) 98 F (36.7 C)  TempSrc:   Oral Oral  SpO2: 98%  99% 100%  Weight:  59.4 kg    Height:        General exam: Appears calm and comfortable. No RUQ pain  The results of significant diagnostics from this hospitalization (including imaging, microbiology, ancillary and laboratory) are listed below for reference.     Procedures and Diagnostic Studies:   Ct Coronary Morph W/cta Cor W/score W/ca W/cm &/or Wo/cm  Addendum Date: 05/19/2019   ADDENDUM REPORT: 05/19/2019 14:12 EXAM: Cardiac/Coronary  CTA TECHNIQUE: The patient was scanned on a [employer] scanner. FINDINGS: A 100 kV prospective scan was triggered in the descending thoracic aorta at 111 HU's. Axial non-contrast 3 mm slices were carried out through the heart. The data set was analyzed on a dedicated work station and scored using the Agatson method. Gantry rotation speed was 250 msecs and collimation was .6 mm. 25 mg of PO Metoprolol and 0.8 mg of sl NTG was given. The 3D data set was reconstructed in 5% intervals of the 67-82 % of the R-R cycle. Diastolic phases were analyzed on a dedicated work station using MPR, MIP and VRT modes. The patient received 80 cc of contrast. Aorta: Normal size. Mild diffuse atherosclerotic disease and calcifications. No dissection. Aortic Valve:  Trileaflet.  No calcifications. Coronary Arteries:  Normal coronary origin.  Right  dominance. RCA is a large dominant artery that gives rise to PDA and PLA. There is mild ostial calcified plaque with stenosis 0-24%. Left main is a large artery that gives rise to LAD and LCX arteries. LAD is a large vessel that has no plaque. LCX is a non-dominant artery that gives rise to one large OM1 branch. There is no plaque. Other findings: Normal pulmonary vein drainage into the left atrium. Normal left atrial appendage without a thrombus. Normal size of  the pulmonary artery. IMPRESSION: 1. Coronary calcium score of 38. This was 67 percentile for age and sex matched control. 2. Normal coronary origin with right dominance. 3. CAD-RADS 1. Minimal non-obstructive CAD (0-24%). Consider non-atherosclerotic causes of chest pain. Consider preventive therapy and risk factor modification. Electronically Signed   By: Tobias Alexander   On: 05/19/2019 14:12   Result Date: 05/19/2019 EXAM: OVER-READ INTERPRETATION  CT CHEST The following report is an over-read performed by radiologist Dr. Trudie Reed of [facility] on 05/19/2019. This over-read does not include interpretation of cardiac or coronary anatomy or pathology. The coronary calcium score/coronary CTA interpretation by the cardiologist is attached. COMPARISON:  None. FINDINGS: Aortic atherosclerosis. Within the visualized portions of the thorax there are no suspicious appearing pulmonary nodules or masses, there is no acute consolidative airspace disease, no pleural effusions, no pneumothorax and no lymphadenopathy. Visualized portions of the upper abdomen are unremarkable. There are no aggressive appearing lytic or blastic lesions noted in the visualized portions of the skeleton. Orthopedic rod and screw fixation hardware throughout the visualized spine. IMPRESSION: 1.  Aortic Atherosclerosis (ICD10-I70.0). Electronically Signed: By: Trudie Reed M.D. On: 05/19/2019 13:35   Dg Chest Port 1 View  Result Date: 05/18/2019 CLINICAL DATA:  Sudden onset mid sternal chest pain, dizziness, shortness of breath, nausea, and vomiting, pain radiating to LEFT arm, history coronary artery disease, a lower stand low syndrome type 3 EXAM: PORTABLE CHEST 1 VIEW COMPARISON:  Portable exam 1304 hours compared to 01/08/2005 FINDINGS: Prior spinal fixation and cervical spine fusion. Normal heart size, mediastinal contours, and pulmonary vascularity. Lungs clear. No acute infiltrate, pleural effusion, or  pneumothorax. Resolution of scoliosis since prior study. Question LEFT nipple shadow versus EKG lead or pulmonary nodule. No acute osseous findings. IMPRESSION: No acute infiltrate. Question LEFT nipple shadow versus EKG lead or pulmonary nodule; follow-up upright PA chest radiograph with nipple markers recommended to exclude pulmonary nodule. Electronically Signed   By: Ulyses Southward M.D.   On: 05/18/2019 13:22   Ct Angio Chest/abd/pel For Dissection W And/or Wo Contrast  Result Date: 05/18/2019 CLINICAL DATA:  Acute chest and back pain. Shortness of breath. Dizziness. Vomiting. EXAM: CT ANGIOGRAPHY CHEST, ABDOMEN AND PELVIS TECHNIQUE: Multidetector CT imaging through the chest, abdomen and pelvis was performed using the standard protocol during bolus administration of intravenous contrast. Multiplanar reconstructed images and MIPs were obtained and reviewed to evaluate the vascular anatomy. CONTRAST:  OMNIPAQUE IOHEXOL 350 MG/ML SOLN COMPARISON:  None. FINDINGS: CTA CHEST FINDINGS Cardiovascular: There is no evidence of aortic dissection or thoracic aortic aneurysm. No appreciable coronary artery calcifications. There are few scattered calcifications in the thoracic aorta. Heart size is normal. No pericardial effusion. No pulmonary emboli. Mediastinum/Nodes: No enlarged mediastinal, hilar, or axillary lymph nodes. Thyroid gland, trachea, and esophagus demonstrate no significant findings. Lungs/Pleura: Lungs are clear. No pleural effusion or pneumothorax. Musculoskeletal: No acute abnormality. The patient has fusion from T4 to the sacrum. The T3-4 level is fused anteriorly by osteophytes. Review  of the MIP images confirms the above findings. CTA ABDOMEN AND PELVIS FINDINGS VASCULAR Aorta: Normal caliber aorta without aneurysm, dissection, vasculitis or significant stenosis. Celiac: Patent without evidence of aneurysm, dissection, vasculitis or significant stenosis. SMA: Patent without evidence of aneurysm,  dissection, vasculitis or significant stenosis. Renals: There are 2 renal arteries bilaterally with no evidence of stenosis. IMA: Patent without evidence of aneurysm, dissection, vasculitis or significant stenosis. Inflow: Patent without evidence of aneurysm, dissection, vasculitis or significant stenosis. Veins: No obvious venous abnormality within the limitations of this arterial phase study. Review of the MIP images confirms the above findings. NON-VASCULAR Hepatobiliary: Gallstones. No gallbladder wall thickening or gallbladder distention. No biliary ductal dilatation. Pancreas: Unremarkable. No pancreatic ductal dilatation or surrounding inflammatory changes. Spleen: Normal in size without focal abnormality. Adrenals/Urinary Tract: Normal adrenal glands. 10 mm simple cyst on the lower pole of the left kidney. Kidneys are otherwise normal. Bladder is normal. Stomach/Bowel: Stomach is within normal limits. Appendix appears normal. No evidence of bowel wall thickening, distention, or inflammatory changes. Lymphatic: No adenopathy. Reproductive: Uterus and bilateral adnexa are unremarkable. Other: No abdominal wall hernia or abnormality. No abdominopelvic ascites. Musculoskeletal: Fusion of the spine from T3 through S1. The lumbar spine is fused to the iliac bones. No acute abnormality. Review of the MIP images confirms the above findings. IMPRESSION: 1. No evidence of aortic dissection or other acute abnormality of the chest, abdomen, or pelvis. 2. Cholelithiasis. Electronically Signed   By: Francene Boyers M.D.   On: 05/18/2019 15:27     Labs:   Basic Metabolic Panel: Recent Labs  Lab 05/18/19 1252  NA 139  K 3.9  CL 103  CO2 25  GLUCOSE 102*  BUN 21*  CREATININE 0.75  CALCIUM 9.3   GFR Estimated Creatinine Clearance: 70 mL/min (by C-G formula based on SCr of 0.75 mg/dL). Liver Function Tests: No results for input(s): AST, ALT, ALKPHOS, BILITOT, PROT, ALBUMIN in the last 168 hours. No  results for input(s): LIPASE, AMYLASE in the last 168 hours. No results for input(s): AMMONIA in the last 168 hours. Coagulation profile No results for input(s): INR, PROTIME in the last 168 hours.  CBC: Recent Labs  Lab 05/18/19 1252  WBC 8.5  NEUTROABS 6.9  HGB 14.4  HCT 43.9  MCV 87.6  PLT 222   Cardiac Enzymes: No results for input(s): CKTOTAL, CKMB, CKMBINDEX, TROPONINI in the last 168 hours. BNP: Invalid input(s): POCBNP CBG: No results for input(s): GLUCAP in the last 168 hours. D-Dimer No results for input(s): DDIMER in the last 72 hours. Hgb A1c No results for input(s): HGBA1C in the last 72 hours. Lipid Profile Recent Labs    05/19/19 0504  CHOL 221*  HDL 78  LDLCALC 133*  TRIG 52  CHOLHDL 2.8   Thyroid function studies No results for input(s): TSH, T4TOTAL, T3FREE, THYROIDAB in the last 72 hours.  Invalid input(s): FREET3 Anemia work up No results for input(s): VITAMINB12, FOLATE, FERRITIN, TIBC, IRON, RETICCTPCT in the last 72 hours. Microbiology Recent Results (from the past 240 hour(s))  SARS CORONAVIRUS 2 (TAT 6-24 HRS) Nasopharyngeal Nasopharyngeal Swab     Status: None   Collection Time: 05/18/19  2:49 PM   Specimen: Nasopharyngeal Swab  Result Value Ref Range Status   SARS Coronavirus 2 NEGATIVE NEGATIVE Final    Comment: (NOTE) SARS-CoV-2 target nucleic acids are NOT DETECTED. The SARS-CoV-2 RNA is generally detectable in upper and lower respiratory specimens during the acute phase of infection. Negative results do  not preclude SARS-CoV-2 infection, do not rule out co-infections with other pathogens, and should not be used as the sole basis for treatment or other patient management decisions. Negative results must be combined with clinical observations, patient history, and epidemiological information. The expected result is Negative. Fact Sheet for Patients: SugarRoll.be Fact Sheet for Healthcare  Providers: https://www.woods-mathews.com/ This test is not yet approved or cleared by the [place] FDA and  has been authorized for detection and/or diagnosis of SARS-CoV-2 by FDA under an Emergency Use Authorization (EUA). This EUA will remain  in effect (meaning this test can be used) for the duration of the COVID-19 declaration under Section 56 4(b)(1) of the Act, 21 U.S.C. section 360bbb-3(b)(1), unless the authorization is terminated or revoked sooner. Performed at [facility], [place] [address] [place], [place] [address]      Discharge Instructions:   Discharge Instructions    Diet - low sodium heart healthy   Complete by: As directed    Increase activity slowly   Complete by: As directed      Allergies as of 05/19/2019   No Known Allergies     Medication List    TAKE these medications   CALTRATE 600+D PO Take 1 tablet by mouth daily.   doxycycline 100 MG tablet Commonly known as: ADOXA Take 100 mg by mouth at bedtime as needed.   gabapentin 300 MG capsule Commonly known as: NEURONTIN TAKE TWO TABS AT BEDTIME FOR NERVE PAIN What changed: See the new instructions.   meloxicam 15 MG tablet Commonly known as: MOBIC TAKE 1 TABLET BY MOUTH TWICE A DAY AS NEEDED What changed:   how much to take  when to take this  reasons to take this   METAMUCIL PO Take 1 tablet by mouth daily.   methocarbamol 750 MG tablet Commonly known as: ROBAXIN TAKE 1 TABLET BY MOUTH EVERYDAY AT BEDTIME What changed:   how much to take  how to take this  when to take this  additional instructions   PROBIOTIC PO Take 1 capsule by mouth daily.   rosuvastatin 5 MG tablet Commonly known as: Crestor Take 1 tablet (5 mg total) by mouth 3 (three) times a week. Start taking on: May 20, 2019   Vitamin D3 50 MCG (2000 UT) capsule Take 2,000 Units by mouth daily.   zolpidem 5 MG tablet Commonly known as: AMBIEN TAKE 1 TABLET BY MOUTH AT BEDTIME  AS NEEDED FOR SLEEP What changed:   reasons to take this  additional instructions      Follow-up Information    Crist Infante, MD Follow up in 1 week(s).   Specialty: Internal Medicine Contact information: [place] [address] [phone]        Troy Sine, MD .   Specialty: Cardiology Contact information: [address] [place] [address] [phone]            Time coordinating discharge: 25 min  Signed:  Geradine Girt DO  [facility] 05/19/2019, 2:50 PM

## 2019-05-19 NOTE — Progress Notes (Signed)
Benna Dunks to be D/C'd Home per MD order.  Discussed with the patient and all questions fully answered.   VSS, Skin clean, dry and intact without evidence of skin break down, no evidence of skin tears noted. IV catheter discontinued intact. Site without signs and symptoms of complications. Dressing and pressure applied.   An After Visit Summary was printed and given to the patient.    D/C education completed with patient/family including follow up instructions, medication list, d/c activities limitations if indicated, with other d/c instructions as indicated by MD - patient able to verbalize understanding, all questions fully answered.    Patient instructed to return to ED, call 911, or call MD for any changes in condition.    Patient escorted via [place], and D/C home via private car.

## 2019-05-19 NOTE — Discharge Instructions (Signed)

## 2019-05-19 NOTE — Progress Notes (Signed)
  Echocardiogram 2D Echocardiogram has been performed.  Jennette Dubin 05/19/2019, 8:54 AM

## 2019-05-19 NOTE — Progress Notes (Signed)
Pt alert and oriented x4. Pt denies chest pain. Pt oriented to unit. Vitals WDL. Cardiac monitor place on patient and verified with second nurse. Provider paged due to pt requesting meds she takes at home. Provider ordered some home meds, due to certain conditions, provider told patient other meds were not ordered. Pt was able to sleep without difficulty throughout the night.

## 2019-05-19 NOTE — Progress Notes (Signed)
Asked to place 18g for C.T. Please note current I.V. is an 18G which was documented in flow sheet incorrectly as a 20G.

## 2019-05-21 DIAGNOSIS — Z92241 Personal history of systemic steroid therapy: Secondary | ICD-10-CM | POA: Diagnosis not present

## 2019-05-21 DIAGNOSIS — R5383 Other fatigue: Secondary | ICD-10-CM | POA: Diagnosis not present

## 2019-05-21 DIAGNOSIS — E162 Hypoglycemia, unspecified: Secondary | ICD-10-CM | POA: Diagnosis not present

## 2019-05-21 DIAGNOSIS — Z8349 Family history of other endocrine, nutritional and metabolic diseases: Secondary | ICD-10-CM | POA: Diagnosis not present

## 2019-05-27 DIAGNOSIS — E785 Hyperlipidemia, unspecified: Secondary | ICD-10-CM | POA: Diagnosis not present

## 2019-05-27 DIAGNOSIS — M546 Pain in thoracic spine: Secondary | ICD-10-CM | POA: Diagnosis not present

## 2019-05-27 DIAGNOSIS — D649 Anemia, unspecified: Secondary | ICD-10-CM | POA: Diagnosis not present

## 2019-05-27 DIAGNOSIS — G4733 Obstructive sleep apnea (adult) (pediatric): Secondary | ICD-10-CM | POA: Diagnosis not present

## 2019-05-29 DIAGNOSIS — E162 Hypoglycemia, unspecified: Secondary | ICD-10-CM | POA: Diagnosis not present

## 2019-06-01 ENCOUNTER — Encounter: Payer: Self-pay | Admitting: Cardiovascular Disease

## 2019-06-01 ENCOUNTER — Ambulatory Visit: Payer: BC Managed Care – PPO | Admitting: Physician Assistant

## 2019-06-01 ENCOUNTER — Ambulatory Visit: Payer: BC Managed Care – PPO | Admitting: Cardiovascular Disease

## 2019-06-01 ENCOUNTER — Other Ambulatory Visit: Payer: Self-pay

## 2019-06-01 DIAGNOSIS — Q7962 Hypermobile Ehlers-Danlos syndrome: Secondary | ICD-10-CM

## 2019-06-01 DIAGNOSIS — E785 Hyperlipidemia, unspecified: Secondary | ICD-10-CM | POA: Diagnosis not present

## 2019-06-01 DIAGNOSIS — R0600 Dyspnea, unspecified: Secondary | ICD-10-CM

## 2019-06-01 DIAGNOSIS — R0609 Other forms of dyspnea: Secondary | ICD-10-CM

## 2019-06-01 DIAGNOSIS — M5030 Other cervical disc degeneration, unspecified cervical region: Secondary | ICD-10-CM

## 2019-06-01 DIAGNOSIS — R0789 Other chest pain: Secondary | ICD-10-CM | POA: Diagnosis not present

## 2019-06-01 DIAGNOSIS — G4733 Obstructive sleep apnea (adult) (pediatric): Secondary | ICD-10-CM

## 2019-06-01 DIAGNOSIS — M5136 Other intervertebral disc degeneration, lumbar region: Secondary | ICD-10-CM

## 2019-06-01 MED ORDER — ROSUVASTATIN CALCIUM 10 MG PO TABS: 10.0000 mg | ORAL_TABLET | ORAL | 2 refills | Status: DC

## 2019-06-01 MED ORDER — AMLODIPINE BESYLATE 2.5 MG PO TABS: 2.5000 mg | ORAL_TABLET | Freq: Every day | ORAL | 3 refills | Status: DC

## 2019-06-01 NOTE — Progress Notes (Addendum)
Cardiology Office Note    Date:  06/01/2019   ID:  Nicole Oneill, DOB 04/21/1961, MRN [id number]  PCP:  Crist Infante, MD  Cardiologist:  Shelva Majestic, MD   No chief complaint on file.   History of Present Illness:  Nicole Oneill is a 58 y.o. female  who I had initially seen in 2007, and saw again in 2015.  She presents to the office today following her recent ER and overnight hospitalization for chest pain.   [name] has Ehlers-Danlos syndrome, hypermobility type III and has had significant  orthopedic issues, a presumptive history of fibromyalgia, as well as Sjogren syndrome.  I had seen her in 2007 when she was experiencing episodes of palpitations and vague chest pain.  Prior to that visit, she had undergone numerous orthopedic procedures with cervical as well as lumbar fusions in addition to knee replacement.  An echo Doppler study in November 2007 revealed normal LV size and function.  She had mild mitral annular calcification.  Her mitral valve was not my myxomatous in appearance and she had evidence for mild mitral regurgitation.  There was trivial tricuspid regurgitation.  Her aortic root dimension was normal and measured 2.3 cm in diameter.  A nuclear perfusion study showed normal perfusion in all regions.  A cardiac monitor showed occasional episodes of sinus tachycardia as well as episodes of PACs, and short bursts of atrial tachycardia without ventricular ectopy.  I saw her when she reestablished with me in 2015 and at that time she had at least 18 surgeries in the last 12 years and had recently undergone underwent complete fusion of her back at [facility].  Due to her hypermobility, osteoarthritis, intervertebral  disc disease and joint pains she ultimately was referred by Dr Andreas Blower to Dr. Meryl Dare at [facility].  She was diagnosed with Ehlers-Danlos syndrome, hypermobility type III. Marland Kitchen  She also was seen in the genetics clinic evaluation.  Because of the association of aortic root  dilatation.  She now presents for cardiologic reevaluation.  When I saw her in 2015, she denied any chest pain and was unaware of any palpitations, presyncope or syncope.  It was recommended by Dr. Meryl Dare that she take take vitamin D and calcium supplements, vitamin C 1000 mg daily , which aids in the cross-linking of collagen fibrils and prolongs life of normal collagen, Epsom salt baths when she has musculoskeletal pain as well as potential oral magnesium supplementation.  From a cardiac standpoint, [name] had been doing pretty well but admits that she often gets short of breath with minimal activity but she is not been able to exercise significantly and this is not any change.  On November 9 she was leaning forward doing Pilates exercise when she began to develop substernal chest pain.  She stopped the exercise and drove home.  Her chest pain increased and radiated to her left arm.  She also noticed significant diaphoresis and developed nausea and vomiting.  Due to progression to severe chest tightness EMS was contacted and she was taken to the emergency room.  Due to her airless Danlos history she underwent CT imaging which did not disclose any abnormality of her aorta and there was no evidence for dissection.  High-sensitivity troponins were minimally increased in a flat plateau at 43.> 53>42> 34.   Her ECG did not reveal any ST abnormality but showed QS complex anteroseptally.  She was seen by Dr. Meda Coffee and subsequently underwent CTA imaging and was found to  have a calcium score of 38 which was 81st percentile for age and sex matched control.  There was minimal nonobstructive plaque of 0 to 24% at the ostium of her large dominant RCA.  Remaining vessels were without any plaque.  A 2D echo Doppler study done on May 18, 2019 showed hyperdynamic LV function with an EF estimated 65 to 70% without focal segmental wall motion abnormalities.  There was trace mitral regurgitation and trace tricuspid  regurgitation.  Her aorta was normal dimension and there was no evidence for dilation or obstruction.  During her hospitalization lipid studies were notable for total cholesterol of 221, HDL 78, LDL 133 and triglycerides 52.  She was advised to take Crestor 5 mg 3 times per week.  Subsequent to her ER evaluation she was seen by [name] who advised cardiology follow-up and raised concern for possible Takotsubo cardiomyopathy.  Presently, she denies recurrent chest pain symptoms.  She had been on hormone replacement therapy for over 10 years but stopped therapy approximately 4 months ago.  She continues to experience mild shortness of breath with walking.  She presents for follow-up cardiology evaluation and reestablishment of cardiology care with me.  In addition to the above, she states that she has seen [name] for obstructive sleep apnea was previously diagnosed of having mild OSA.  She has had issues with a comfortable mask.   Past Medical History:  Diagnosis Date   Anemia    Arthritis    osteo   Atrial flutter ([place])    x 10 years ago wias diagnosed with A. Flutter while wearing a heart monitor.  No problems since.    Complication of anesthesia    urinary retention requiring indwelling  foley catheterization   DDD (degenerative disc disease)    Ehlers-Danlos syndrome type III    Fibromyalgia    History of blood transfusion    Osteoarthritis     Past Surgical History:  Procedure Laterality Date   BACK SURGERY     Cervical fusion C5-7, fusion  T10-S1   DIAGNOSTIC LAPAROSCOPY     x 3   EYE SURGERY     bilateral tear duct fusion   JOINT REPLACEMENT     partial left knee 2005, total left knee 2012   KNEE ARTHROPLASTY     KNEE ARTHROSCOPY  08/06/2012   Procedure: ARTHROSCOPY KNEE;  Surgeon: Gearlean Alf, MD;  Location: [facility];  Service: Orthopedics;  Laterality: Left;  WITH SYNOVECTOMY and debridement   OSTEOTOMY     bilateral tibial    Current  Medications: Outpatient Medications Prior to Visit  Medication Sig Dispense Refill   Calcium Carbonate-Vitamin D (CALTRATE 600+D PO) Take 1 tablet by mouth daily.      Cholecalciferol (VITAMIN D3) 2000 units capsule Take 2,000 Units by mouth daily.     gabapentin (NEURONTIN) 300 MG capsule TAKE TWO TABS AT BEDTIME FOR NERVE PAIN (Patient taking differently: Take 600 mg by mouth at bedtime. for nerve pain) 60 capsule 0   meloxicam (MOBIC) 15 MG tablet TAKE 1 TABLET BY MOUTH TWICE A DAY AS NEEDED (Patient taking differently: Take 7.5-15 mg by mouth at bedtime as needed for pain. ) 60 tablet 0   methocarbamol (ROBAXIN) 750 MG tablet TAKE 1 TABLET BY MOUTH EVERYDAY AT BEDTIME (Patient taking differently: Take 750 mg by mouth at bedtime. ) 90 tablet 1   Probiotic Product (PROBIOTIC PO) Take 1 capsule by mouth daily.     Psyllium (METAMUCIL  PO) Take 1 tablet by mouth daily.     zolpidem (AMBIEN) 5 MG tablet TAKE 1 TABLET BY MOUTH AT BEDTIME AS NEEDED FOR SLEEP (Patient taking differently: Take 5 mg by mouth at bedtime as needed for sleep. ) 90 tablet 0   rosuvastatin (CRESTOR) 5 MG tablet Take 1 tablet (5 mg total) by mouth 3 (three) times a week. 36 tablet 0   doxycycline (ADOXA) 100 MG tablet Take 100 mg by mouth at bedtime as needed.      No facility-administered medications prior to visit.      Allergies:   Patient has no known allergies.   Social History   Socioeconomic History   Marital status: Married    Spouse name: Scheryl Darter   Number of children: 3   Years of education: BA   Highest education level: Not on file  Occupational History    Employer: [employer]    Comment: [employer]   Social Needs   Financial resource strain: Not on file   Food insecurity    Worry: Not on file    Inability: Not on file   Transportation needs    Medical: Not on file    Non-medical: Not on file  Tobacco Use   Smoking status: Never Smoker   Smokeless tobacco: Never Used   Substance and Sexual Activity   Alcohol use: Yes    Comment: 3 glasses of wine weekly   Drug use: No   Sexual activity: Not on file  Lifestyle   Physical activity    Days per week: Not on file    Minutes per session: Not on file   Stress: Not on file  Relationships   Social connections    Talks on phone: Not on file    Gets together: Not on file    Attends religious service: Not on file    Active member of club or organization: Not on file    Attends meetings of clubs or organizations: Not on file    Relationship status: Not on file  Other Topics Concern   Not on file  Social History Narrative   Patient lives at home with family.   Caffeine Use: 4-5 sodas weekly     Family History:  The patient's family history includes Anorexia nervosa in her sister; Atrial fibrillation in her father; Cancer - Colon in her paternal grandfather and paternal grandmother; Cancer - Prostate in her father; Congestive Heart Failure in her father; Healthy in her daughter, daughter, and daughter; Heart attack in her maternal grandmother; Hypertension in her father; Stroke in her maternal grandfather and mother.  She believes her mother most likely had Erler's Danlos and sister also has Erler's Danlos syndrome.  ROS General: Negative; No fevers, chills, or night sweats;  HEENT: Negative; No changes in vision or hearing, sinus congestion, difficulty swallowing Pulmonary: Negative; No cough, wheezing, shortness of breath, hemoptysis Cardiovascular: See HPI GI: Negative; No nausea, vomiting, diarrhea, or abdominal pain GU: Negative; No dysuria, hematuria, or difficulty voiding Musculoskeletal: Significant joint issues with multiple back surgeries and ultimate complete fusion of her back from T1-S1 done at [facility] Hematologic/Oncology: Negative; no easy bruising, bleeding Endocrine: Negative; no heat/cold intolerance; no diabetes Neuro: Negative; no changes in balance, headaches Skin: Negative; No  rashes or skin lesions Psychiatric: Negative; No behavioral problems, depression Sleep: Positive for OSA and has been on CPAP therapy evaluated by Dr. [name]. no daytime sleepiness, hypersomnolence, bruxism, restless legs, hypnogognic hallucinations, no cataplexy Other comprehensive 14 point  system review is negative.   PHYSICAL EXAM:   VS:  BP 131/69    Pulse 67    Temp (!) 97.3 F (36.3 C)    Ht 5' 5.5" (1.664 m)    Wt 130 lb 3.2 oz (59.1 kg)    SpO2 100%    BMI 21.34 kg/m     Repeat blood pressure by me was 132/82  Wt Readings from Last 3 Encounters:  06/01/19 130 lb 3.2 oz (59.1 kg)  05/18/19 130 lb 15.3 oz (59.4 kg)  02/19/19 125 lb (56.7 kg)    General: Alert, oriented, no distress.  Skin: normal turgor, no rashes, warm and dry HEENT: Normocephalic, atraumatic. Pupils equal round and reactive to light; sclera anicteric; extraocular muscles intact; Nose without nasal septal hypertrophy Mouth/Parynx benign; Mallinpatti scale 2 Neck: No JVD, no carotid bruits; normal carotid upstroke Lungs: clear to ausculatation and percussion; no wheezing or rales Chest wall: without tenderness to palpitation Heart: PMI not displaced, RRR, s1 s2 normal, 1/6 systolic murmur lower sternal border and apex, no diastolic murmur, no rubs, gallops, thrills, or heaves Abdomen: soft, nontender; no hepatosplenomehaly, BS+; abdominal aorta nontender and not dilated by palpation. Back: no CVA tenderness; surgical scar down her entire spine Pulses 2+ Musculoskeletal: full range of motion, normal strength, no joint deformities Extremities: no clubbing cyanosis or edema, Homan's sign negative  Neurologic: grossly nonfocal; Cranial nerves grossly wnl Psychologic: Normal mood and affect   Studies/Labs Reviewed:   EKG:  EKG is  ordered today.  ECG (independently read by me): Sinus bradycardia 58 bpm.  QS complex V1 V2 V3, not significantly changed from her ER tracing of May 18, 2019.  Normal intervals.   No ectopy  Recent Labs: BMP Latest Ref Rng & Units 05/18/2019 02/09/2019 04/28/2018  Glucose 70 - 99 mg/dL 102(H) 89 46(L)  BUN 6 - 20 mg/dL 21(H) 23 21  Creatinine 0.44 - 1.00 mg/dL 0.75 0.84 0.87  BUN/Creat Ratio 6 - 22 (calc) - NOT APPLICABLE NOT APPLICABLE  Sodium 702 - 145 mmol/L 139 142 141  Potassium 3.5 - 5.1 mmol/L 3.9 4.1 4.2  Chloride 98 - 111 mmol/L 103 105 105  CO2 22 - 32 mmol/L '25 28 31  '$ Calcium 8.9 - 10.3 mg/dL 9.3 9.1 9.1     Hepatic Function Latest Ref Rng & Units 02/09/2019 04/28/2018 10/29/2017  Total Protein 6.1 - 8.1 g/dL 6.2 6.3 6.9  Albumin 3.6 - 5.1 g/dL - - -  AST 10 - 35 U/L '18 14 10  '$ ALT 6 - 29 U/L '12 9 9  '$ Alk Phosphatase 33 - 130 U/L - - -  Total Bilirubin 0.2 - 1.2 mg/dL 0.4 0.4 0.4    CBC Latest Ref Rng & Units 05/18/2019 02/09/2019 04/28/2018  WBC 4.0 - 10.5 K/uL 8.5 4.4 4.0  Hemoglobin 12.0 - 15.0 g/dL 14.4 11.8 12.2  Hematocrit 36.0 - 46.0 % 43.9 35.3 36.0  Platelets 150 - 400 K/uL 222 176 183   Lab Results  Component Value Date   MCV 87.6 05/18/2019   MCV 87.4 02/09/2019   MCV 84.9 04/28/2018   No results found for: TSH No results found for: HGBA1C   BNP No results found for: BNP  ProBNP No results found for: PROBNP   Lipid Panel     Component Value Date/Time   CHOL 221 (H) 05/19/2019 0504   TRIG 52 05/19/2019 0504   HDL 78 05/19/2019 0504   CHOLHDL 2.8 05/19/2019 0504   VLDL 10 05/19/2019  0504   LDLCALC 133 (H) 05/19/2019 0504     RADIOLOGY: Ct Coronary Morph W/cta Cor Nancy Fetter W/ca W/cm &/or Wo/cm  Addendum Date: 05/19/2019   ADDENDUM REPORT: 05/19/2019 14:12 EXAM: Cardiac/Coronary  CTA TECHNIQUE: The patient was scanned on a [employer]. FINDINGS: A 100 kV prospective scan was triggered in the descending thoracic aorta at 111 HU's. Axial non-contrast 3 mm slices were carried out through the heart. The data set was analyzed on a dedicated work station and scored using the [place]. Gantry rotation speed was  250 msecs and collimation was .6 mm. 25 mg of PO Metoprolol and 0.8 mg of sl NTG was given. The 3D data set was reconstructed in 5% intervals of the 67-82 % of the R-R cycle. Diastolic phases were analyzed on a dedicated work station using MPR, MIP and VRT modes. The patient received 80 cc of contrast. Aorta: Normal size. Mild diffuse atherosclerotic disease and calcifications. No dissection. Aortic Valve:  Trileaflet.  No calcifications. Coronary Arteries:  Normal coronary origin.  Right dominance. RCA is a large dominant artery that gives rise to PDA and PLA. There is mild ostial calcified plaque with stenosis 0-24%. Left main is a large artery that gives rise to LAD and LCX arteries. LAD is a large vessel that has no plaque. LCX is a non-dominant artery that gives rise to one large OM1 branch. There is no plaque. Other findings: Normal pulmonary vein drainage into the left atrium. Normal left atrial appendage without a thrombus. Normal size of the pulmonary artery. IMPRESSION: 1. Coronary calcium score of 38. This was 49 percentile for age and sex matched control. 2. Normal coronary origin with right dominance. 3. CAD-RADS 1. Minimal non-obstructive CAD (0-24%). Consider non-atherosclerotic causes of chest pain. Consider preventive therapy and risk factor modification. Electronically Signed   By: Ena Dawley   On: 05/19/2019 14:12   Result Date: 05/19/2019 EXAM: OVER-READ INTERPRETATION  CT CHEST The following report is an over-read performed by radiologist Dr. Vinnie Langton of [facility], [place] on 05/19/2019. This over-read does not include interpretation of cardiac or coronary anatomy or pathology. The coronary calcium score/coronary CTA interpretation by the cardiologist is attached. COMPARISON:  None. FINDINGS: Aortic atherosclerosis. Within the visualized portions of the thorax there are no suspicious appearing pulmonary nodules or masses, there is no acute consolidative airspace disease, no  pleural effusions, no pneumothorax and no lymphadenopathy. Visualized portions of the upper abdomen are unremarkable. There are no aggressive appearing lytic or blastic lesions noted in the visualized portions of the skeleton. Orthopedic rod and screw fixation hardware throughout the visualized spine. IMPRESSION: 1.  Aortic Atherosclerosis (ICD10-I70.0). Electronically Signed: By: Vinnie Langton M.D. On: 05/19/2019 13:35   Dg Chest Port 1 View  Result Date: 05/18/2019 CLINICAL DATA:  Sudden onset mid sternal chest pain, dizziness, shortness of breath, nausea, and vomiting, pain radiating to LEFT arm, history coronary artery disease, a lower stand low syndrome type 3 EXAM: PORTABLE CHEST 1 VIEW COMPARISON:  Portable exam 1304 hours compared to 01/08/2005 FINDINGS: Prior spinal fixation and cervical spine fusion. Normal heart size, mediastinal contours, and pulmonary vascularity. Lungs clear. No acute infiltrate, pleural effusion, or pneumothorax. Resolution of scoliosis since prior study. Question LEFT nipple shadow versus EKG lead or pulmonary nodule. No acute osseous findings. IMPRESSION: No acute infiltrate. Question LEFT nipple shadow versus EKG lead or pulmonary nodule; follow-up upright PA chest radiograph with nipple markers recommended to exclude pulmonary nodule. Electronically Signed   By: Elta Guadeloupe  Thornton Papas M.D.   On: 05/18/2019 13:22   Ct Angio Chest/abd/pel For Dissection W And/or Wo Contrast  Result Date: 05/18/2019 CLINICAL DATA:  Acute chest and back pain. Shortness of breath. Dizziness. Vomiting. EXAM: CT ANGIOGRAPHY CHEST, ABDOMEN AND PELVIS TECHNIQUE: Multidetector CT imaging through the chest, abdomen and pelvis was performed using the standard protocol during bolus administration of intravenous contrast. Multiplanar reconstructed images and MIPs were obtained and reviewed to evaluate the vascular anatomy. CONTRAST:  138m OMNIPAQUE IOHEXOL 350 MG/ML SOLN COMPARISON:  None. FINDINGS: CTA CHEST  FINDINGS Cardiovascular: There is no evidence of aortic dissection or thoracic aortic aneurysm. No appreciable coronary artery calcifications. There are few scattered calcifications in the thoracic aorta. Heart size is normal. No pericardial effusion. No pulmonary emboli. Mediastinum/Nodes: No enlarged mediastinal, hilar, or axillary lymph nodes. Thyroid gland, trachea, and esophagus demonstrate no significant findings. Lungs/Pleura: Lungs are clear. No pleural effusion or pneumothorax. Musculoskeletal: No acute abnormality. The patient has fusion from T4 to the sacrum. The T3-4 level is fused anteriorly by osteophytes. Review of the MIP images confirms the above findings. CTA ABDOMEN AND PELVIS FINDINGS VASCULAR Aorta: Normal caliber aorta without aneurysm, dissection, vasculitis or significant stenosis. Celiac: Patent without evidence of aneurysm, dissection, vasculitis or significant stenosis. SMA: Patent without evidence of aneurysm, dissection, vasculitis or significant stenosis. Renals: There are 2 renal arteries bilaterally with no evidence of stenosis. IMA: Patent without evidence of aneurysm, dissection, vasculitis or significant stenosis. Inflow: Patent without evidence of aneurysm, dissection, vasculitis or significant stenosis. Veins: No obvious venous abnormality within the limitations of this arterial phase study. Review of the MIP images confirms the above findings. NON-VASCULAR Hepatobiliary: Gallstones. No gallbladder wall thickening or gallbladder distention. No biliary ductal dilatation. Pancreas: Unremarkable. No pancreatic ductal dilatation or surrounding inflammatory changes. Spleen: Normal in size without focal abnormality. Adrenals/Urinary Tract: Normal adrenal glands. 10 mm simple cyst on the lower pole of the left kidney. Kidneys are otherwise normal. Bladder is normal. Stomach/Bowel: Stomach is within normal limits. Appendix appears normal. No evidence of bowel wall thickening, distention,  or inflammatory changes. Lymphatic: No adenopathy. Reproductive: Uterus and bilateral adnexa are unremarkable. Other: No abdominal wall hernia or abnormality. No abdominopelvic ascites. Musculoskeletal: Fusion of the spine from T3 through S1. The lumbar spine is fused to the iliac bones. No acute abnormality. Review of the MIP images confirms the above findings. IMPRESSION: 1. No evidence of aortic dissection or other acute abnormality of the chest, abdomen, or pelvis. 2. Cholelithiasis. Electronically Signed   By: JLorriane ShireM.D.   On: 05/18/2019 15:27     Additional studies/ records that were reviewed today include:   ECHO 05/18/2019 IMPRESSIONS   1. Left ventricular ejection fraction, by visual estimation, is 65 to 70%. The left ventricle has hyperdynamic function. There is no left ventricular hypertrophy.  2. Global right ventricle has normal systolic function.The right ventricular size is normal. No increase in right ventricular wall thickness.  3. Left atrial size was normal.  4. Right atrial size was normal.  5. The mitral valve is normal in structure. Trace mitral valve regurgitation. No evidence of mitral stenosis.  6. The tricuspid valve is normal in structure. Tricuspid valve regurgitation is trivial.  7. The aortic valve is normal in structure. Aortic valve regurgitation is not visualized. No evidence of aortic valve sclerosis or stenosis.  8. The pulmonic valve was normal in structure. Pulmonic valve regurgitation is not visualized.  9. The inferior vena cava is normal in size with greater  than 50% respiratory variability, suggesting right atrial pressure of 3 mmHg.     ASSESSMENT:    1. Chest tightness   2. Exertional dyspnea   3. Hyperlipidemia, unspecified hyperlipidemia type   4. Ehlers-Danlos syndrome type III   5. OSA (obstructive sleep apnea)   6. DDD (degenerative disc disease), cervical   7. DDD (degenerative disc disease), lumbar     PLAN:  [name]  is a very pleasant 58 year old female who has a history of Erler's Danlos type III and has had numerous orthopedic issues contributed by hypermobility as well as disc disease.  She had recently developed an episode of severe substernal chest tightness associated with left arm radiation, diaphoresis, nausea and vomiting worrisome for potential ischemic etiology.  Overnight hospitalization revealed a flat minimally increased high-sensitivity troponin curve more suggestive of possible demand ischemia rather than ACS.  However, it appears that her symptoms first occurred when she was leaning over doing upon a Pilates move and lifting raising some concern for possible transient vasomotor spasm contributing to her symptomatology.  Her echo Doppler study shows hyperdynamic LV function without wall motion abnormalities.  There is no evidence for apical ballooning or apical hypocontractility arguing against Takotsubo cardiomyopathy.  I reviewed the imaging studies from her hospitalization.  Her CTA reveals mild coronary calcification involving the ostium of her RCA and her calcium score was mildly increased at 38.  With her LDL cholesterol of 133, I have recommended aggressive lipid-lowering therapy in order to achieve a target less than 70 and attempt to hopefully induce plaque stability and regression.  As such, she will slightly titrate her Crestor from 5 mg 3 times a week to every other day and if she tolerates every other day she will then increase this to daily with probable future plans to increase to 10 mg daily.  She has had some blood pressure elevation readings and with her recent symptomatology I am electing to add very low-dose amlodipine 2.5 mg daily both for optimal blood pressure as well as potential vasospasm benefit.  It was suggested by her primary physician that she may not need to be using the CPAP.  However, I reviewed with her data regarding CPAP benefit particularly with obstructive sleep apnea and  cardiovascular risk.  She is followed by Dr. [name].  She may benefit from the new ResMed AirFit N 30i mask or Respironics DreamWear mask which she may be able to tolerate better.  I recommended she discuss this with Dr. [name].  In 2 to 3 months she will undergo follow-up comprehensive metabolic panel lipid studies.  I will see her in the office in follow-up and further recommendations will be made at that time.   Medication Adjustments/Labs and Tests Ordered: Current medicines are reviewed at length with the patient today.  Concerns regarding medicines are outlined above.  Medication changes, Labs and Tests ordered today are listed in the Patient Instructions below. Patient Instructions  Medication Instructions:  Your physician has recommended you make the following change in your medication:   START AMLODIPINE (NORVASC) 2.5 MG BY MOUTH DAILY   TAKE ROSUVASTATIN (CRESTOR) 10 MG BY MOUTH  *If you need a refill on your cardiac medications before your next appointment, please call your pharmacy*  Lab Work Your physician recommends that you return for lab work in 2-3 MONTHS:  [place]   If you have labs (blood work) drawn today and your tests are completely normal, you will receive your  results only by:  MyChart Message (if you have MyChart) OR  A paper copy in the mail If you have any lab test that is abnormal or we need to change your treatment, we will call you to review the results.  Testing/Procedures: NONE  Follow-Up: At [facility], you and your health needs are our priority.  As part of our continuing mission to provide you with exceptional heart care, we have created designated Provider Care Teams.  These Care Teams include your primary Cardiologist (physician) and Advanced Practice Providers (APPs -  Physician Assistants and Nurse Practitioners) who all work together to provide you with the care you need, when you need  it.  Your next appointment:   3 month(s)  The format for your next appointment:   In Person  Provider:   You may see Shelva Majestic, MD or one of the following Advanced Practice Providers on your designated Care Team:    Almyra Deforest, PA-C  Fabian Sharp, PA-C or   Roby Lofts, [place]   Other Instructions  Heart-Healthy Eating Plan Heart-healthy meal planning includes:  Eating less unhealthy fats.  Eating more healthy fats.  Making other changes in your diet. Talk with your doctor or a diet specialist (dietitian) to create an eating plan that is right for you. What is my plan? Your doctor may recommend an eating plan that includes:  Total fat: ______% or less of total calories a day.  Saturated fat: ______% or less of total calories a day.  Cholesterol: less than _________mg a day. What are tips for following this plan? Cooking Avoid frying your food. Try to bake, boil, grill, or broil it instead. You can also reduce fat by:  Removing the skin from poultry.  Removing all visible fats from meats.  Steaming vegetables in water or broth. Meal planning   At meals, divide your plate into four equal parts: ? Fill one-half of your plate with vegetables and green salads. ? Fill one-fourth of your plate with whole grains. ? Fill one-fourth of your plate with lean protein foods.  Eat 4-5 servings of vegetables per day. A serving of vegetables is: ? 1 cup of raw or cooked vegetables. ? 2 cups of raw leafy greens.  Eat 4-5 servings of fruit per day. A serving of fruit is: ? 1 medium whole fruit. ?  cup of dried fruit. ?  cup of fresh, frozen, or canned fruit. ?  cup of 100% fruit juice.  Eat more foods that have soluble fiber. These are apples, broccoli, carrots, beans, peas, and barley. Try to get 20-30 g of fiber per day.  Eat 4-5 servings of nuts, legumes, and seeds per week: ? 1 serving of dried beans or legumes equals  cup after being cooked. ? 1 serving of  nuts is  cup. ? 1 serving of seeds equals 1 tablespoon. General information  Eat more home-cooked food. Eat less restaurant, buffet, and fast food.  Limit or avoid alcohol.  Limit foods that are high in starch and sugar.  Avoid fried foods.  Lose weight if you are overweight.  Keep track of how much salt (sodium) you eat. This is important if you have high blood pressure. Ask your doctor to tell you more about this.  Try to add vegetarian meals each week. Fats  Choose healthy fats. These include olive oil and canola oil, flaxseeds, walnuts, almonds, and seeds.  Eat more omega-3 fats. These include salmon, mackerel, sardines, tuna, flaxseed oil, and ground flaxseeds.  Try to eat fish at least 2 times each week.  Check food labels. Avoid foods with trans fats or high amounts of saturated fat.  Limit saturated fats. ? These are often found in animal products, such as meats, butter, and cream. ? These are also found in plant foods, such as palm oil, palm kernel oil, and coconut oil.  Avoid foods with partially hydrogenated oils in them. These have trans fats. Examples are stick margarine, some tub margarines, cookies, crackers, and other baked goods. What foods can I eat? Fruits All fresh, canned (in natural juice), or frozen fruits. Vegetables Fresh or frozen vegetables (raw, steamed, roasted, or grilled). Green salads. Grains Most grains. Choose whole wheat and whole grains most of the time. Rice and pasta, including brown rice and pastas made with whole wheat. Meats and other proteins Lean, well-trimmed beef, veal, pork, and lamb. Chicken and [place] without skin. All fish and shellfish. Wild duck, rabbit, pheasant, and venison. Egg whites or low-cholesterol egg substitutes. Dried beans, peas, lentils, and tofu. Seeds and most nuts. Dairy Low-fat or nonfat cheeses, including ricotta and mozzarella. Skim or 1% milk that is liquid, powdered, or evaporated. Buttermilk that is made  with low-fat milk. Nonfat or low-fat yogurt. Fats and oils Non-hydrogenated (trans-free) margarines. Vegetable oils, including soybean, sesame, sunflower, olive, peanut, safflower, corn, canola, and cottonseed. Salad dressings or mayonnaise made with a vegetable oil. Beverages Mineral water. Coffee and tea. Diet carbonated beverages. Sweets and desserts Sherbet, gelatin, and fruit ice. Small amounts of dark chocolate. Limit all sweets and desserts. Seasonings and condiments All seasonings and condiments. The items listed above may not be a complete list of foods and drinks you can eat. Contact a dietitian for more options. What foods should I avoid? Fruits Canned fruit in heavy syrup. Fruit in cream or butter sauce. Fried fruit. Limit coconut. Vegetables Vegetables cooked in cheese, cream, or butter sauce. Fried vegetables. Grains Breads that are made with saturated or trans fats, oils, or whole milk. Croissants. Sweet rolls. Donuts. High-fat crackers, such as cheese crackers. Meats and other proteins Fatty meats, such as hot dogs, ribs, sausage, bacon, rib-eye roast or steak. High-fat deli meats, such as salami and bologna. Caviar. Domestic duck and goose. Organ meats, such as liver. Dairy Cream, sour cream, cream cheese, and creamed cottage cheese. Whole-milk cheeses. Whole or 2% milk that is liquid, evaporated, or condensed. Whole buttermilk. Cream sauce or high-fat cheese sauce. Yogurt that is made from whole milk. Fats and oils Meat fat, or shortening. Cocoa butter, hydrogenated oils, palm oil, coconut oil, palm kernel oil. Solid fats and shortenings, including bacon fat, salt pork, lard, and butter. Nondairy cream substitutes. Salad dressings with cheese or sour cream. Beverages Regular sodas and juice drinks with added sugar. Sweets and desserts Frosting. Pudding. Cookies. Cakes. Pies. Milk chocolate or white chocolate. Buttered syrups. Full-fat ice cream or ice cream drinks. The  items listed above may not be a complete list of foods and drinks to avoid. Contact a dietitian for more information. Summary  Heart-healthy meal planning includes eating less unhealthy fats, eating more healthy fats, and making other changes in your diet.  Eat a balanced diet. This includes fruits and vegetables, low-fat or nonfat dairy, lean protein, nuts and legumes, whole grains, and heart-healthy oils and fats. This information is not intended to replace advice given to you by your health care provider. Make sure you discuss any questions you have with your health care provider. Document Released: 12/25/2011 Document Revised:  08/29/2017 Document Reviewed: 08/02/2017 Elsevier Patient Education  2020 [employer].     Signed, Shelva Majestic, MD  06/01/2019 2:50 PM    [facility] [address] [place], [place], [place]  [address] Phone: [phone]

## 2019-06-01 NOTE — Patient Instructions (Addendum)
Medication Instructions:  Your physician has recommended you make the following change in your medication:   START AMLODIPINE (NORVASC) 2.5 MG BY MOUTH DAILY   TAKE ROSUVASTATIN (CRESTOR) 10 MG BY MOUTH  *If you need a refill on your cardiac medications before your next appointment, please call your pharmacy*  Lab Work Your physician recommends that you return for lab work in 2-3 MONTHS:  COMPREHENSIVE METABOLIC PANEL AND FASTING LIPID PROFILE   If you have labs (blood work) drawn today and your tests are completely normal, you will receive your results only by: Marland Kitchen. MyChart Message (if you have MyChart) OR . A paper copy in the mail If you have any lab test that is abnormal or we need to change your treatment, we will call you to review the results.  Testing/Procedures: NONE  Follow-Up: At [facility], you and your health needs are our priority.  As part of our continuing mission to provide you with exceptional heart care, we have created designated Provider Care Teams.  These Care Teams include your primary Cardiologist (physician) and Advanced Practice Providers (APPs -  Physician Assistants and Nurse Practitioners) who all work together to provide you with the care you need, when you need it.  Your next appointment:   3 month(s)  The format for your next appointment:   In Person  Provider:   You may see Nicki Guadalajarahomas Kelly, MD or one of the following Advanced Practice Providers on your designated Care Team:    Azalee CourseHao Meng, PA-C  Micah FlesherAngela Duke, PA-C or   Judy PimpleKrista Kroeger, [place]-C   Other Instructions  Heart-Healthy Eating Plan Heart-healthy meal planning includes:  Eating less unhealthy fats.  Eating more healthy fats.  Making other changes in your diet. Talk with your doctor or a diet specialist (dietitian) to create an eating plan that is right for you. What is my plan? Your doctor may recommend an eating plan that includes:  Total fat: ______% or less of total calories a  day.  Saturated fat: ______% or less of total calories a day.  Cholesterol: less than _________mg a day. What are tips for following this plan? Cooking Avoid frying your food. Try to bake, boil, grill, or broil it instead. You can also reduce fat by:  Removing the skin from poultry.  Removing all visible fats from meats.  Steaming vegetables in water or broth. Meal planning   At meals, divide your plate into four equal parts: ? Fill one-half of your plate with vegetables and green salads. ? Fill one-fourth of your plate with whole grains. ? Fill one-fourth of your plate with lean protein foods.  Eat 4-5 servings of vegetables per day. A serving of vegetables is: ? 1 cup of raw or cooked vegetables. ? 2 cups of raw leafy greens.  Eat 4-5 servings of fruit per day. A serving of fruit is: ? 1 medium whole fruit. ?  cup of dried fruit. ?  cup of fresh, frozen, or canned fruit. ?  cup of 100% fruit juice.  Eat more foods that have soluble fiber. These are apples, broccoli, carrots, beans, peas, and barley. Try to get 20-30 g of fiber per day.  Eat 4-5 servings of nuts, legumes, and seeds per week: ? 1 serving of dried beans or legumes equals  cup after being cooked. ? 1 serving of nuts is  cup. ? 1 serving of seeds equals 1 tablespoon. General information  Eat more home-cooked food. Eat less restaurant, buffet, and fast food.  Limit or avoid alcohol.  Limit foods that are high in starch and sugar.  Avoid fried foods.  Lose weight if you are overweight.  Keep track of how much salt (sodium) you eat. This is important if you have high blood pressure. Ask your doctor to tell you more about this.  Try to add vegetarian meals each week. Fats  Choose healthy fats. These include olive oil and canola oil, flaxseeds, walnuts, almonds, and seeds.  Eat more omega-3 fats. These include salmon, mackerel, sardines, tuna, flaxseed oil, and ground flaxseeds. Try to eat fish  at least 2 times each week.  Check food labels. Avoid foods with trans fats or high amounts of saturated fat.  Limit saturated fats. ? These are often found in animal products, such as meats, butter, and cream. ? These are also found in plant foods, such as palm oil, palm kernel oil, and coconut oil.  Avoid foods with partially hydrogenated oils in them. These have trans fats. Examples are stick margarine, some tub margarines, cookies, crackers, and other baked goods. What foods can I eat? Fruits All fresh, canned (in natural juice), or frozen fruits. Vegetables Fresh or frozen vegetables (raw, steamed, roasted, or grilled). Green salads. Grains Most grains. Choose whole wheat and whole grains most of the time. Rice and pasta, including brown rice and pastas made with whole wheat. Meats and other proteins Lean, well-trimmed beef, veal, pork, and lamb. Chicken and [place] without skin. All fish and shellfish. Wild duck, rabbit, pheasant, and venison. Egg whites or low-cholesterol egg substitutes. Dried beans, peas, lentils, and tofu. Seeds and most nuts. Dairy Low-fat or nonfat cheeses, including ricotta and mozzarella. Skim or 1% milk that is liquid, powdered, or evaporated. Buttermilk that is made with low-fat milk. Nonfat or low-fat yogurt. Fats and oils Non-hydrogenated (trans-free) margarines. Vegetable oils, including soybean, sesame, sunflower, olive, peanut, safflower, corn, canola, and cottonseed. Salad dressings or mayonnaise made with a vegetable oil. Beverages Mineral water. Coffee and tea. Diet carbonated beverages. Sweets and desserts Sherbet, gelatin, and fruit ice. Small amounts of dark chocolate. Limit all sweets and desserts. Seasonings and condiments All seasonings and condiments. The items listed above may not be a complete list of foods and drinks you can eat. Contact a dietitian for more options. What foods should I avoid? Fruits Canned fruit in heavy syrup. Fruit  in cream or butter sauce. Fried fruit. Limit coconut. Vegetables Vegetables cooked in cheese, cream, or butter sauce. Fried vegetables. Grains Breads that are made with saturated or trans fats, oils, or whole milk. Croissants. Sweet rolls. Donuts. High-fat crackers, such as cheese crackers. Meats and other proteins Fatty meats, such as hot dogs, ribs, sausage, bacon, rib-eye roast or steak. High-fat deli meats, such as salami and bologna. Caviar. Domestic duck and goose. Organ meats, such as liver. Dairy Cream, sour cream, cream cheese, and creamed cottage cheese. Whole-milk cheeses. Whole or 2% milk that is liquid, evaporated, or condensed. Whole buttermilk. Cream sauce or high-fat cheese sauce. Yogurt that is made from whole milk. Fats and oils Meat fat, or shortening. Cocoa butter, hydrogenated oils, palm oil, coconut oil, palm kernel oil. Solid fats and shortenings, including bacon fat, salt pork, lard, and butter. Nondairy cream substitutes. Salad dressings with cheese or sour cream. Beverages Regular sodas and juice drinks with added sugar. Sweets and desserts Frosting. Pudding. Cookies. Cakes. Pies. Milk chocolate or white chocolate. Buttered syrups. Full-fat ice cream or ice cream drinks. The items listed above may not be a complete  list of foods and drinks to avoid. Contact a dietitian for more information. Summary  Heart-healthy meal planning includes eating less unhealthy fats, eating more healthy fats, and making other changes in your diet.  Eat a balanced diet. This includes fruits and vegetables, low-fat or nonfat dairy, lean protein, nuts and legumes, whole grains, and heart-healthy oils and fats. This information is not intended to replace advice given to you by your health care provider. Make sure you discuss any questions you have with your health care provider. Document Released: 12/25/2011 Document Revised: 08/29/2017 Document Reviewed: 08/02/2017 Elsevier Patient Education   2020 [employer].

## 2019-06-11 ENCOUNTER — Telehealth: Payer: Self-pay | Admitting: Rheumatology

## 2019-06-11 NOTE — Telephone Encounter (Signed)
Patient has added Crestor to her medication, and is concerned with taking this with Mobic. Please call patient to advise.

## 2019-06-12 ENCOUNTER — Encounter: Payer: Self-pay | Admitting: Cardiovascular Disease

## 2019-06-12 ENCOUNTER — Telehealth: Payer: Self-pay | Admitting: Cardiovascular Disease

## 2019-06-12 DIAGNOSIS — R5383 Other fatigue: Secondary | ICD-10-CM | POA: Diagnosis not present

## 2019-06-12 DIAGNOSIS — Z92241 Personal history of systemic steroid therapy: Secondary | ICD-10-CM | POA: Diagnosis not present

## 2019-06-12 DIAGNOSIS — Z8349 Family history of other endocrine, nutritional and metabolic diseases: Secondary | ICD-10-CM | POA: Diagnosis not present

## 2019-06-12 NOTE — Telephone Encounter (Signed)
Returned call to patient no answer.LMTC. 

## 2019-06-12 NOTE — Telephone Encounter (Signed)
Left message for pt to call.

## 2019-06-12 NOTE — Telephone Encounter (Signed)
error 

## 2019-06-12 NOTE — Telephone Encounter (Signed)
Received call from patient she is concerned about taking Crestor 5 mg.Stated she rather take Lipitor.She is concerned about liver damage.Advised I will send message to [facility] for advice.

## 2019-06-12 NOTE — Telephone Encounter (Signed)
See previous 06/12/19 telephone note.

## 2019-06-12 NOTE — Telephone Encounter (Signed)
Returned patient's call.  She is considering switching to Lipitor because she is concerned about elevated LFT's and rhabdomyolysis.  Discussed that there are no interactions with mobic and Crestor or Lipitor.  Advised that Crestor has been associated with myopathy and and rhabdomyolysis.  Advised that Lipitor would be an okay alternative to Crestor.  Patient verbalized understanding.    All questions encouraged and answered.  Instructed patient to call with any other questions or concerns.   Mariella Saa, PharmD, [place], [place] Clinical Specialty Pharmacist [phone]  06/12/2019 3:54 PM

## 2019-06-12 NOTE — Telephone Encounter (Signed)
New message:    Patient calling concerning some medication and would like for some one to call her. Please call patient.

## 2019-06-12 NOTE — Telephone Encounter (Signed)
Patient returning call.

## 2019-06-17 ENCOUNTER — Other Ambulatory Visit: Payer: Self-pay

## 2019-06-17 MED ORDER — GABAPENTIN 300 MG PO CAPS: ORAL_CAPSULE | ORAL | 0 refills | Status: DC

## 2019-06-17 NOTE — Telephone Encounter (Signed)
If patient does not want to take Crestor 5 mg, try Lipitor 10 mg if he if she prefers Lipitor

## 2019-06-18 MED ORDER — ATORVASTATIN CALCIUM 10 MG PO TABS: 10.0000 mg | ORAL_TABLET | Freq: Every day | ORAL | 3 refills | Status: DC

## 2019-06-18 NOTE — Telephone Encounter (Signed)
Spoke to patient Dr.Kelly's advice given.Lipitor prescription sent to pharmacy.

## 2019-06-18 NOTE — Addendum Note (Signed)
Addended by: Kathyrn Lass on: 06/18/2019 05:58 PM   Modules accepted: Orders

## 2019-06-30 ENCOUNTER — Other Ambulatory Visit: Payer: Self-pay | Admitting: Rheumatology

## 2019-06-30 NOTE — Telephone Encounter (Signed)
Last Visit: 02/09/2019 Next Visit: 09/01/2019 Labs: 05/18/2019 glucose 102, BUN 21.   Okay to refill per Dr. Estanislado Pandy.

## 2019-07-20 ENCOUNTER — Encounter: Payer: Self-pay | Admitting: Rheumatology

## 2019-07-20 MED ORDER — ZOLPIDEM TARTRATE 5 MG PO TABS: 5.0000 mg | ORAL_TABLET | Freq: Every evening | ORAL | 0 refills | Status: DC | PRN

## 2019-07-20 MED ORDER — METHOCARBAMOL 750 MG PO TABS: ORAL_TABLET | ORAL | 0 refills | Status: DC

## 2019-07-20 NOTE — Telephone Encounter (Signed)
Last Visit: 02/09/2019 Next Visit: 09/01/2019  Last fill: 05/12/2019 (of ambien) 02/09/2019 (of robaxin)  Okay to refill ambien and robaxin?

## 2019-07-21 ENCOUNTER — Other Ambulatory Visit: Payer: Self-pay

## 2019-07-21 MED ORDER — GABAPENTIN 300 MG PO CAPS: ORAL_CAPSULE | ORAL | 0 refills | Status: DC

## 2019-07-22 DIAGNOSIS — Z01419 Encounter for gynecological examination (general) (routine) without abnormal findings: Secondary | ICD-10-CM | POA: Diagnosis not present

## 2019-07-22 DIAGNOSIS — Z6822 Body mass index (BMI) 22.0-22.9, adult: Secondary | ICD-10-CM | POA: Diagnosis not present

## 2019-07-27 NOTE — Progress Notes (Signed)
Office Visit Note  Patient: Nicole Oneill             Date of Birth: 1961/05/10           MRN: [id number]             PCP: [name], MD Referring: [name], MD Visit Date: 07/28/2019 Occupation: @GUAROCC @  Subjective:  No chief complaint on file.   History of Present Illness: Nicole Oneill is a 59 y.o. female with history of osteoarthritis, degenerative disc disease and Erler's Danlos syndrome.  According to patient in November while she was doing a Pilates class she started feeling dizzy, nauseated.  She left the studio and while she was driving home she started having chest pain, palpitations, radiculopathy to her left arm and perspiration.  She called 911 and was taken to the emergency room.  Patient states that she had overnight observation and was discharged home.  She also had cardiology work-up.  She was placed on amlodipine for elevated blood pressure and also statins for elevated lipids.  Cardiac event was ruled out.  She has been experiencing discomfort over bilateral hips, she points to the trochanteric area.  She states they are very painful in the morning and then the symptoms wear off.  She continues to have discomfort in the thoracic and lumbar spine which is constant and dull pain.  She has noticed decreased grip strength.  She also has discomfort in her left knee which had total knee replacement.  Activities of Daily Living:  Patient reports morning stiffness for 30 minutes.   Patient Reports nocturnal pain.  Difficulty dressing/grooming: Denies Difficulty climbing stairs: Denies Difficulty getting out of chair: Denies Difficulty using hands for taps, buttons, cutlery, and/or writing: Reports  Review of Systems  Constitutional: Positive for fatigue.  HENT: Positive for mouth dryness. Negative for mouth sores and nose dryness.   Eyes: Positive for dryness.  Respiratory: Negative for shortness of breath, wheezing and difficulty breathing.   Cardiovascular: Negative  for chest pain and palpitations.  Gastrointestinal: Positive for constipation. Negative for blood in stool and diarrhea.  Endocrine: Negative for increased urination.  Genitourinary: Negative for difficulty urinating and painful urination.  Musculoskeletal: Positive for arthralgias, joint pain and morning stiffness. Negative for joint swelling.  Skin: Negative for rash.  Allergic/Immunologic: Negative for susceptible to infections.  Neurological: Negative for dizziness, numbness, headaches, memory loss and weakness.  Hematological: Positive for bruising/bleeding tendency.  Psychiatric/Behavioral: Positive for sleep disturbance. Negative for confusion.    PMFS History:  Patient Active Problem List   Diagnosis Date Noted  . Chest pain 05/18/2019  . Right shoulder pain 02/19/2019  . History of sleep apnea 04/28/2018  . DDD (degenerative disc disease), cervical 04/28/2018  . DDD (degenerative disc disease), lumbar 10/29/2017  . Sicca syndrome, unspecified (HCC) 10/29/2017  . Left anterior shoulder pain 11/27/2016  . History of lumbar fusion 10/16/2016  . History of fusion of cervical spine 10/16/2016  . History of total knee arthroplasty, left 2012 10/16/2016  . Other fatigue 10/16/2016  . Primary insomnia 10/16/2016  . DDD lumbar 10/15/2016  . DJD (degenerative joint disease), cervical 10/15/2016  . DDD (degenerative disc disease), thoracic 10/15/2016  . Primary osteoarthritis of both knees 10/15/2016  . Tendinopathy of right gluteus medius 05/29/2016  . Chronic lumbar radiculopathy 03/08/2016  . Heart murmur, systolic 06/19/2014  . Disorder of SI (sacroiliac) joint 06/02/2014  . Functional gait abnormality 06/02/2014  . Ehlers-Danlos syndrome type III 03/10/2014  .  Foot arch pain 03/10/2014  . Labral tear of shoulder 05/26/2013  . Left knee pain 05/26/2013  . Primary osteoarthritis of both hands 05/26/2013  . Effusion of knee joint, left 08/05/2012  . Plantar fasciitis  02/05/2011  . Lateral epicondylitis of right elbow 10/12/2010  . Osteoarthritis of multiple joints 10/12/2010    Past Medical History:  Diagnosis Date  . Anemia   . Arthritis    osteo  . Atrial flutter (HCC)    x 10 years ago wias diagnosed with A. Flutter while wearing a heart monitor.  No problems since.   . Complication of anesthesia    urinary retention requiring indwelling  foley catheterization  . DDD (degenerative disc disease)   . Ehlers-Danlos syndrome type III   . Fibromyalgia   . History of blood transfusion   . Osteoarthritis     Family History  Problem Relation Age of Onset  . Stroke Mother        hemorrhagic  . Hypertension Father   . Cancer - Prostate Father   . Atrial fibrillation Father   . Congestive Heart Failure Father   . Anorexia nervosa Sister   . Heart attack Maternal Grandmother   . Stroke Maternal Grandfather   . Cancer - Colon Paternal Grandmother   . Cancer - Colon Paternal Grandfather   . Healthy Daughter   . Healthy Daughter   . Healthy Daughter    Past Surgical History:  Procedure Laterality Date  . BACK SURGERY     Cervical fusion C5-7, fusion  T10-S1  . DIAGNOSTIC LAPAROSCOPY     x 3  . EYE SURGERY     bilateral tear duct fusion  . JOINT REPLACEMENT     partial left knee 2005, total left knee 2012  . KNEE ARTHROPLASTY    . KNEE ARTHROSCOPY  08/06/2012   Procedure: ARTHROSCOPY KNEE;  Surgeon: Loanne Drilling, MD;  Location: [facility];  Service: Orthopedics;  Laterality: Left;  WITH SYNOVECTOMY and debridement  . OSTEOTOMY     bilateral tibial   Social History   Social History Narrative   Patient lives at home with family.   Caffeine Use: 4-5 sodas weekly    There is no immunization history on file for this patient.   Objective: Vital Signs: BP 119/76 (BP Location: Left Arm, Patient Position: Sitting, Cuff Size: Normal)   Pulse 78   Resp 13   Ht 5\' 5"  (1.651 m)   Wt 131 lb 9.6 oz (59.7 kg)   BMI 21.90 kg/m    Physical  Exam Vitals and nursing note reviewed.  Constitutional:      Appearance: She is well-developed.  HENT:     Head: Normocephalic and atraumatic.  Eyes:     Conjunctiva/sclera: Conjunctivae normal.  Cardiovascular:     Rate and Rhythm: Normal rate and regular rhythm.     Heart sounds: Normal heart sounds.  Pulmonary:     Effort: Pulmonary effort is normal.     Breath sounds: Normal breath sounds.  Abdominal:     General: Bowel sounds are normal.     Palpations: Abdomen is soft.  Musculoskeletal:     Cervical back: Normal range of motion.  Lymphadenopathy:     Cervical: No cervical adenopathy.  Skin:    General: Skin is warm and dry.     Capillary Refill: Capillary refill takes less than 2 seconds.  Neurological:     Mental Status: She is alert and oriented to person, place, and  time.  Psychiatric:        Behavior: Behavior normal.      Musculoskeletal Exam: She is good range of motion of her cervical spine.  She almost no range of motion in the thoracic and lumbar spine due to effusion.  Shoulder joints, elbow joints, wrist joints in good range of motion.  She had bilateral CMC subluxation.  She has PIP and DIP thickening consistent with osteoarthritis.  Hip joints with good range of motion.  Her left total hip replacement is doing well without any effusion had some warmth.  Right knee joint had good range of motion without discomfort.  She had tenderness over bilateral trochanteric bursa consistent with trochanteric bursitis.  CDAI Exam: CDAI Score: -- Patient Global: --; Provider Global: -- Swollen: --; Tender: -- Joint Exam 07/28/2019   No joint exam has been documented for this visit   There is currently no information documented on the homunculus. Go to the Rheumatology activity and complete the homunculus joint exam.  Investigation: No additional findings.  Imaging: No results found.  Recent Labs: Lab Results  Component Value Date   WBC 8.5 05/18/2019   HGB  14.4 05/18/2019   PLT 222 05/18/2019   NA 139 05/18/2019   K 3.9 05/18/2019   CL 103 05/18/2019   CO2 25 05/18/2019   GLUCOSE 102 (H) 05/18/2019   BUN 21 (H) 05/18/2019   CREATININE 0.75 05/18/2019   BILITOT 0.4 02/09/2019   ALKPHOS 56 10/25/2016   AST 18 02/09/2019   ALT 12 02/09/2019   PROT 6.2 02/09/2019   ALBUMIN 4.5 10/25/2016   CALCIUM 9.3 05/18/2019   GFRAA >60 05/18/2019    Speciality Comments: No specialty comments available.  Procedures:  No procedures performed Allergies: Patient has no known allergies.   Assessment / Plan:     Visit Diagnoses: Primary osteoarthritis of both hands - she has severe CMC arthritis with subluxation.  She has been having difficulty with gripping.  I have given her a prescription for bilateral CMC brace which will be helpful.  Primary osteoarthritis of both knees - Left total knee replacement 2012.  She continues to have discomfort in her left knee.  She had osteotomy on her right knee joint which is doing quite well.  Trochanteric bursitis of both hips-she had tenderness over bilateral trochanteric bursa.  IT band exercises were demonstrated.  I would avoid cortisone injections.  Sicca syndrome, unspecified (HCC)-she has been using over-the-counter products which are helpful.  DDD (degenerative disc disease), cervical - Status post fusion.   DDD (degenerative disc disease), lumbar - Status post fusion.  She takes methocarbamol for muscle spasms as needed.  DDD (degenerative disc disease), thoracic - Status post fusion.  Medication monitoring encounter - patient has been taking Mobic. - Plan: Hepatic function panel she had CMC and BMP recently.  Ehlers-Danlos syndrome type III - Followed up by Dr. Oneida Alar. She has Ehlers-Danlos syndrome and hypermobility  Primary insomnia - Ambien 5 mg p.o. nightly .  Other fatigue  History of sleep apnea - on CPAP   Orders: Orders Placed This Encounter  Procedures  . Hepatic function panel    No orders of the defined types were placed in this encounter.   Face-to-face time spent with patient was 30 minutes. Greater than 50% of time was spent in counseling and coordination of care.  Follow-Up Instructions: Return in about 6 months (around 01/25/2020) for Osteoarthritis,DDD, EDS.   Bo Merino, MD  Note - This record has been  created using [employer].  Chart creation errors have been sought, but may not always  have been located. Such creation errors do not reflect on  the standard of medical care.

## 2019-07-28 ENCOUNTER — Other Ambulatory Visit: Payer: Self-pay

## 2019-07-28 ENCOUNTER — Encounter: Payer: Self-pay | Admitting: Rheumatology

## 2019-07-28 ENCOUNTER — Ambulatory Visit (INDEPENDENT_AMBULATORY_CARE_PROVIDER_SITE_OTHER): Payer: BC Managed Care – PPO | Admitting: Rheumatology

## 2019-07-28 VITALS — BP 119/76 | HR 78 | Resp 13 | Ht 65.0 in | Wt 131.6 lb

## 2019-07-28 DIAGNOSIS — M19042 Primary osteoarthritis, left hand: Secondary | ICD-10-CM

## 2019-07-28 DIAGNOSIS — M170 Bilateral primary osteoarthritis of knee: Secondary | ICD-10-CM

## 2019-07-28 DIAGNOSIS — M3500 Sicca syndrome, unspecified: Secondary | ICD-10-CM | POA: Diagnosis not present

## 2019-07-28 DIAGNOSIS — Q7962 Hypermobile Ehlers-Danlos syndrome: Secondary | ICD-10-CM

## 2019-07-28 DIAGNOSIS — M7061 Trochanteric bursitis, right hip: Secondary | ICD-10-CM

## 2019-07-28 DIAGNOSIS — Z5181 Encounter for therapeutic drug level monitoring: Secondary | ICD-10-CM | POA: Diagnosis not present

## 2019-07-28 DIAGNOSIS — M19041 Primary osteoarthritis, right hand: Secondary | ICD-10-CM

## 2019-07-28 DIAGNOSIS — M5136 Other intervertebral disc degeneration, lumbar region: Secondary | ICD-10-CM

## 2019-07-28 DIAGNOSIS — M5134 Other intervertebral disc degeneration, thoracic region: Secondary | ICD-10-CM

## 2019-07-28 DIAGNOSIS — M5030 Other cervical disc degeneration, unspecified cervical region: Secondary | ICD-10-CM

## 2019-07-28 DIAGNOSIS — F5101 Primary insomnia: Secondary | ICD-10-CM

## 2019-07-28 DIAGNOSIS — M7062 Trochanteric bursitis, left hip: Secondary | ICD-10-CM

## 2019-07-28 DIAGNOSIS — R5383 Other fatigue: Secondary | ICD-10-CM

## 2019-07-28 DIAGNOSIS — Z8669 Personal history of other diseases of the nervous system and sense organs: Secondary | ICD-10-CM

## 2019-07-28 LAB — HEPATIC FUNCTION PANEL
AG Ratio: 2.4 (calc) (ref 1.0–2.5)
ALT: 14 U/L (ref 6–29)
AST: 17 U/L (ref 10–35)
Albumin: 4.6 g/dL (ref 3.6–5.1)
Alkaline phosphatase (APISO): 56 U/L (ref 37–153)
Bilirubin, Direct: 0.1 mg/dL (ref 0.0–0.2)
Globulin: 1.9 g/dL (calc) (ref 1.9–3.7)
Indirect Bilirubin: 0.3 mg/dL (calc) (ref 0.2–1.2)
Total Bilirubin: 0.4 mg/dL (ref 0.2–1.2)
Total Protein: 6.5 g/dL (ref 6.1–8.1)

## 2019-07-28 NOTE — Patient Instructions (Signed)
Iliotibial Band Syndrome Rehab Ask your health care provider which exercises are safe for you. Do exercises exactly as told by your health care provider and adjust them as directed. It is normal to feel mild stretching, pulling, tightness, or discomfort as you do these exercises. Stop right away if you feel sudden pain or your pain gets significantly worse. Do not begin these exercises until told by your health care provider. Stretching and range-of-motion exercises These exercises warm up your muscles and joints and improve the movement and flexibility of your hip and pelvis. Quadriceps stretch, prone  1. Lie on your abdomen on a firm surface, such as a bed or padded floor (prone position). 2. Bend your left / right knee and reach back to hold your ankle or pant leg. If you cannot reach your ankle or pant leg, loop a belt around your foot and grab the belt instead. 3. Gently pull your heel toward your buttocks. Your knee should not slide out to the side. You should feel a stretch in the front of your thigh and knee (quadriceps). 4. Hold this position for __________ seconds. Repeat __________ times. Complete this exercise __________ times a day. Iliotibial band stretch An iliotibial band is a strong band of muscle tissue that runs from the outer side of your hip to the outer side of your thigh and knee. 1. Lie on your side with your left / right leg in the top position. 2. Bend both of your knees and grab your left / right ankle. Stretch out your bottom arm to help you balance. 3. Slowly bring your top knee back so your thigh goes behind your trunk. 4. Slowly lower your top leg toward the floor until you feel a gentle stretch on the outside of your left / right hip and thigh. If you do not feel a stretch and your knee will not fall farther, place the heel of your other foot on top of your knee and pull your knee down toward the floor with your foot. 5. Hold this position for __________  seconds. Repeat __________ times. Complete this exercise __________ times a day. Strengthening exercises These exercises build strength and endurance in your hip and pelvis. Endurance is the ability to use your muscles for a long time, even after they get tired. Straight leg raises, side-lying This exercise strengthens the muscles that rotate the leg at the hip and move it away from your body (hip abductors). 1. Lie on your side with your left / right leg in the top position. Lie so your head, shoulder, hip, and knee line up. You may bend your bottom knee to help you balance. 2. Roll your hips slightly forward so your hips are stacked directly over each other and your left / right knee is facing forward. 3. Tense the muscles in your outer thigh and lift your top leg 4-6 inches (10-15 cm). 4. Hold this position for __________ seconds. 5. Slowly return to the starting position. Let your muscles relax completely before doing another repetition. Repeat __________ times. Complete this exercise __________ times a day. Leg raises, prone This exercise strengthens the muscles that move the hips (hip extensors). 1. Lie on your abdomen on your bed or a firm surface. You can put a pillow under your hips if that is more comfortable for your lower back. 2. Bend your left / right knee so your foot is straight up in the air. 3. Squeeze your buttocks muscles and lift your left / right thigh   off the bed. Do not let your back arch. 4. Tense your thigh muscle as hard as you can without increasing any knee pain. 5. Hold this position for __________ seconds. 6. Slowly lower your leg to the starting position and allow it to relax completely. Repeat __________ times. Complete this exercise __________ times a day. Hip hike 1. Stand sideways on a bottom step. Stand on your left / right leg with your other foot unsupported next to the step. You can hold on to the railing or wall for balance if needed. 2. Keep your knees  straight and your torso square. Then lift your left / right hip up toward the ceiling. 3. Slowly let your left / right hip lower toward the floor, past the starting position. Your foot should get closer to the floor. Do not lean or bend your knees. Repeat __________ times. Complete this exercise __________ times a day. This information is not intended to replace advice given to you by your health care provider. Make sure you discuss any questions you have with your health care provider. Document Revised: 10/16/2018 Document Reviewed: 04/16/2018 Elsevier Patient Education  2020 [employer]  

## 2019-07-29 NOTE — Progress Notes (Signed)
LFTs are normal

## 2019-08-05 ENCOUNTER — Ambulatory Visit: Payer: Self-pay

## 2019-08-05 ENCOUNTER — Other Ambulatory Visit: Payer: Self-pay

## 2019-08-05 ENCOUNTER — Ambulatory Visit (INDEPENDENT_AMBULATORY_CARE_PROVIDER_SITE_OTHER): Payer: BLUE CROSS/BLUE SHIELD | Admitting: Family Medicine

## 2019-08-05 VITALS — BP 104/72 | Ht 65.0 in | Wt 130.0 lb

## 2019-08-05 DIAGNOSIS — M25561 Pain in right knee: Secondary | ICD-10-CM

## 2019-08-05 DIAGNOSIS — M722 Plantar fascial fibromatosis: Secondary | ICD-10-CM | POA: Diagnosis not present

## 2019-08-05 NOTE — Patient Instructions (Addendum)
Your exam and ultrasound are reassuring. You have hamstring tendinopathy and to a lesser extent patellar maltracking causing your symptoms. Icing 15 minutes at a time 3-4 times a day. Ibuprofen or aleve only if needed. Start home exercises as directed - 3 sets of 10 once a day. Consider sleeve or brace for support when up and walking around. Consider formal physical therapy if not improving as expected. Follow up with me in 1 month.

## 2019-08-07 ENCOUNTER — Encounter: Payer: Self-pay | Admitting: Family Medicine

## 2019-08-07 NOTE — Progress Notes (Signed)
PCP: [name], MD  Subjective:   HPI: Patient is a 59 y.o. female here for right knee pain.  Patient has history of EDS. Reports having partial then total left knee arthroplasty. No surgery to right knee though did have bilateral tibial osteotomies in 2008 for significant varus deformity of her knees. Her current pain started about 1 month ago. Feels this mainly posteriorly but has had this knee buckle on her a few times and concerned about this instability. Has been taking mobic 7.5mg  daily with tumeric and tart cherry and ginger. Not noticed much benefit to date. No numbness, questionable swelling but states difficult to tell as her left knee is more swollen following arthroplasty.  Past Medical History:  Diagnosis Date  . Anemia   . Arthritis    osteo  . Atrial flutter (HCC)    x 10 years ago wias diagnosed with A. Flutter while wearing a heart monitor.  No problems since.   . Complication of anesthesia    urinary retention requiring indwelling  foley catheterization  . DDD (degenerative disc disease)   . Ehlers-Danlos syndrome type III   . Fibromyalgia   . History of blood transfusion   . Osteoarthritis     Current Outpatient Medications on File Prior to Visit  Medication Sig Dispense Refill  . amLODipine (NORVASC) 2.5 MG tablet Take 1 tablet (2.5 mg total) by mouth daily. 180 tablet 3  . atorvastatin (LIPITOR) 10 MG tablet Take 1 tablet (10 mg total) by mouth daily. 90 tablet 3  . Calcium Carbonate-Vitamin D (CALTRATE 600+D PO) Take 1 tablet by mouth daily.     . Cholecalciferol (VITAMIN D3) 2000 units capsule Take 2,000 Units by mouth daily.    Marland Kitchen doxycycline (ADOXA) 100 MG tablet Take 100 mg by mouth at bedtime as needed.     . gabapentin (NEURONTIN) 300 MG capsule Take two tabs at bedtime for nerve pain. 60 capsule 0  . meloxicam (MOBIC) 15 MG tablet TAKE 1 TABLET BY MOUTH TWICE A DAY AS NEEDED 60 tablet 0  . methocarbamol (ROBAXIN) 750 MG tablet TAKE 1 TABLET BY  MOUTH EVERYDAY AT BEDTIME 90 tablet 0  . Probiotic Product (PROBIOTIC PO) Take 1 capsule by mouth daily.    . Psyllium (METAMUCIL PO) Take 1 tablet by mouth as needed.     . zolpidem (AMBIEN) 5 MG tablet Take 1 tablet (5 mg total) by mouth at bedtime as needed. for sleep 90 tablet 0   No current facility-administered medications on file prior to visit.    Past Surgical History:  Procedure Laterality Date  . BACK SURGERY     Cervical fusion C5-7, fusion  T10-S1  . DIAGNOSTIC LAPAROSCOPY     x 3  . EYE SURGERY     bilateral tear duct fusion  . JOINT REPLACEMENT     partial left knee 2005, total left knee 2012  . KNEE ARTHROPLASTY    . KNEE ARTHROSCOPY  08/06/2012   Procedure: ARTHROSCOPY KNEE;  Surgeon: Loanne Drilling, MD;  Location: [facility];  Service: Orthopedics;  Laterality: Left;  WITH SYNOVECTOMY and debridement  . OSTEOTOMY     bilateral tibial    No Known Allergies  Social History   Socioeconomic History  . Marital status: Married    Spouse name: [name]  . Number of children: 3  . Years of education: BA  . Highest education level: Not on file  Occupational History    Employer: [employer]  Comment: [employer]   Tobacco Use  . Smoking status: Never Smoker  . Smokeless tobacco: Never Used  Substance and Sexual Activity  . Alcohol use: Yes    Comment: 3 glasses of wine weekly  . Drug use: No  . Sexual activity: Not on file  Other Topics Concern  . Not on file  Social History Narrative   Patient lives at home with family.   Caffeine Use: 4-5 sodas weekly   Social Determinants of Health   Financial Resource Strain:   . Difficulty of Paying Living Expenses: Not on file  Food Insecurity:   . Worried About Programme researcher, broadcasting/film/video in the Last Year: Not on file  . Ran Out of Food in the Last Year: Not on file  Transportation Needs:   . Lack of Transportation (Medical): Not on file  . Lack of Transportation (Non-Medical): Not on file  Physical Activity:    . Days of Exercise per Week: Not on file  . Minutes of Exercise per Session: Not on file  Stress:   . Feeling of Stress : Not on file  Social Connections:   . Frequency of Communication with Friends and Family: Not on file  . Frequency of Social Gatherings with Friends and Family: Not on file  . Attends Religious Services: Not on file  . Active Member of Clubs or Organizations: Not on file  . Attends Banker Meetings: Not on file  . Marital Status: Not on file  Intimate Partner Violence:   . Fear of Current or Ex-Partner: Not on file  . Emotionally Abused: Not on file  . Physically Abused: Not on file  . Sexually Abused: Not on file    Family History  Problem Relation Age of Onset  . Stroke Mother        hemorrhagic  . Hypertension Father   . Cancer - Prostate Father   . Atrial fibrillation Father   . Congestive Heart Failure Father   . Anorexia nervosa Sister   . Heart attack Maternal Grandmother   . Stroke Maternal Grandfather   . Cancer - Colon Paternal Grandmother   . Cancer - Colon Paternal Grandfather   . Healthy Daughter   . Healthy Daughter   . Healthy Daughter     BP 104/72   Ht 5\' 5"  (1.651 m)   Wt 130 lb (59 kg)   BMI 21.63 kg/m   Review of Systems: See HPI above.     Objective:  Physical Exam:  Gen: NAD, comfortable in exam room  Right knee: No gross deformity, ecchymoses.  Minimal effusion.  No palpable baker's cyst. Mild tenderness medially over semimembranosis and semitendinosis tendons.  No joint line, other tenderness. FROM with 5/5 strength, minimal pain with flexion. Negative ant/post drawers. Negative valgus/varus testing. Negative lachmans. Negative mcmurrays, apleys, patellar apprehension, bounce, thessalys. NV intact distally.  MSK u/s right knee, limited:  Very small effusion.  Arthropathy noted medial and lateral compartments.  Degenerative changes in medial meniscus.  No baker's cyst.  Patellar tendon thickened but  without other abnormalities.  No bursitis.  Lateral meniscus intact without abnormalities.   Assessment & Plan:  1. Right knee pain, instability - her exam is reassuring.  While there are degenerative changes in medial meniscus on ultrasound, her exam is negative for this being cause of her symptoms.  Consistent with medial hamstring tendinopathy.  Advised we start with home exercise program focusing on quad and hamstring strengthening.  She may have some  patellar maltracking - reports feeling or hearing a pop in knee at times but exam again is reassuring.  She has mobic to use or can switch to ibuprofen or aleve.  Consider formal physical therapy.  Knee brace or sleeve.  F/u in 1 month.

## 2019-08-18 ENCOUNTER — Ambulatory Visit: Payer: Self-pay | Admitting: Sports Medicine

## 2019-08-20 ENCOUNTER — Other Ambulatory Visit: Payer: Self-pay

## 2019-08-20 MED ORDER — GABAPENTIN 300 MG PO CAPS: ORAL_CAPSULE | ORAL | 0 refills | Status: DC

## 2019-08-31 DIAGNOSIS — R079 Chest pain, unspecified: Secondary | ICD-10-CM | POA: Diagnosis not present

## 2019-08-31 DIAGNOSIS — E785 Hyperlipidemia, unspecified: Secondary | ICD-10-CM | POA: Diagnosis not present

## 2019-08-31 LAB — COMPREHENSIVE METABOLIC PANEL
ALT: 23 IU/L (ref 0–32)
AST: 18 IU/L (ref 0–40)
Albumin/Globulin Ratio: 2.7 — ABNORMAL HIGH (ref 1.2–2.2)
Albumin: 4.6 g/dL (ref 3.8–4.9)
Alkaline Phosphatase: 53 IU/L (ref 39–117)
BUN/Creatinine Ratio: 21 (ref 12–28)
BUN: 17 mg/dL (ref 8–27)
Bilirubin Total: 0.3 mg/dL (ref 0.0–1.2)
CO2: 24 mmol/L (ref 20–29)
Calcium: 9.1 mg/dL (ref 8.7–10.3)
Chloride: 105 mmol/L (ref 96–106)
Creatinine, Ser: 0.8 mg/dL (ref 0.57–1.00)
GFR calc Af Amer: 93 mL/min/{1.73_m2} (ref >59)
GFR calc non Af Amer: 80 mL/min/{1.73_m2} (ref >59)
Globulin, Total: 1.7 g/dL (ref 1.5–4.5)
Glucose: 82 mg/dL (ref 65–99)
Potassium: 4.4 mmol/L (ref 3.5–5.2)
Sodium: 144 mmol/L (ref 134–144)
Total Protein: 6.3 g/dL (ref 6.0–8.5)

## 2019-08-31 LAB — LIPID PANEL
Chol/HDL Ratio: 1.9 ratio (ref 0.0–4.4)
Cholesterol, Total: 128 mg/dL (ref 100–199)
HDL: 67 mg/dL (ref >39)
LDL Chol Calc (NIH): 48 mg/dL (ref 0–99)
Triglycerides: 57 mg/dL (ref 0–149)
VLDL Cholesterol Cal: 13 mg/dL (ref 5–40)

## 2019-09-01 ENCOUNTER — Ambulatory Visit: Payer: BC Managed Care – PPO | Admitting: Physician Assistant

## 2019-09-03 NOTE — Telephone Encounter (Signed)
Viewed by pt at 7:07 PM on 09/02/2019

## 2019-09-09 ENCOUNTER — Ambulatory Visit (INDEPENDENT_AMBULATORY_CARE_PROVIDER_SITE_OTHER): Payer: BLUE CROSS/BLUE SHIELD | Admitting: Family Medicine

## 2019-09-09 ENCOUNTER — Other Ambulatory Visit: Payer: Self-pay

## 2019-09-09 ENCOUNTER — Encounter: Payer: Self-pay | Admitting: Family Medicine

## 2019-09-09 VITALS — BP 110/72 | Ht 65.5 in | Wt 130.0 lb

## 2019-09-09 DIAGNOSIS — M25561 Pain in right knee: Secondary | ICD-10-CM

## 2019-09-09 DIAGNOSIS — M25551 Pain in right hip: Secondary | ICD-10-CM | POA: Diagnosis not present

## 2019-09-09 NOTE — Progress Notes (Addendum)
PCP: [name], MD  Subjective:   HPI: Patient is a 59 y.o. female with hx of EDS and significant skeletal surgeries including bilateral tibial osteometies for varus deformity presenting for f/u of right knee evaluation.   Patient was initially seen/evaluated on 1/27 for right knee pain following an incident where her knee buckled and she felt continuing instability. On visit, patient had stable knee exam with suspicious findings for degenerative meniscus findings on ultrasound. Plan was to start home exercise program focusing on quad and hamstring strengthening.   Today, patient presents noting continued right knee issues with right hip problems as well. She notes that she is more concerned about popping/clicking of both the hip and knee and is not experiencing true pain. These sounds are heard mainly during walking or piliates and with her hx of EDS have worried her for pathology.   Past Medical History:  Diagnosis Date  . Anemia   . Arthritis    osteo  . Atrial flutter (HCC)    x 10 years ago wias diagnosed with A. Flutter while wearing a heart monitor.  No problems since.   . Complication of anesthesia    urinary retention requiring indwelling  foley catheterization  . DDD (degenerative disc disease)   . Ehlers-Danlos syndrome type III   . Fibromyalgia   . History of blood transfusion   . Osteoarthritis     Current Outpatient Medications on File Prior to Visit  Medication Sig Dispense Refill  . atorvastatin (LIPITOR) 10 MG tablet Take 1 tablet (10 mg total) by mouth daily. 90 tablet 3  . Calcium Carbonate-Vitamin D (CALTRATE 600+D PO) Take 1 tablet by mouth daily.     . Cholecalciferol (VITAMIN D3) 2000 units capsule Take 2,000 Units by mouth daily.    Marland Kitchen doxycycline (ADOXA) 100 MG tablet Take 100 mg by mouth at bedtime as needed.     . gabapentin (NEURONTIN) 300 MG capsule Take two tabs at bedtime for nerve pain. 120 capsule 0  . meloxicam (MOBIC) 15 MG tablet TAKE 1 TABLET  BY MOUTH TWICE A DAY AS NEEDED (Patient taking differently: Take 7.5 mg by mouth daily. ) 60 tablet 0  . methocarbamol (ROBAXIN) 750 MG tablet TAKE 1 TABLET BY MOUTH EVERYDAY AT BEDTIME 90 tablet 0  . Probiotic Product (PROBIOTIC PO) Take 1 capsule by mouth daily.    . Psyllium (METAMUCIL PO) Take 1 tablet by mouth as needed.     . zolpidem (AMBIEN) 5 MG tablet Take 1 tablet (5 mg total) by mouth at bedtime as needed. for sleep 90 tablet 0  . amLODipine (NORVASC) 2.5 MG tablet Take 1 tablet (2.5 mg total) by mouth daily. 180 tablet 3   No current facility-administered medications on file prior to visit.    Past Surgical History:  Procedure Laterality Date  . BACK SURGERY     Cervical fusion C5-7, fusion  T10-S1  . DIAGNOSTIC LAPAROSCOPY     x 3  . EYE SURGERY     bilateral tear duct fusion  . JOINT REPLACEMENT     partial left knee 2005, total left knee 2012  . KNEE ARTHROPLASTY    . KNEE ARTHROSCOPY  08/06/2012   Procedure: ARTHROSCOPY KNEE;  Surgeon: Loanne Drilling, MD;  Location: [facility];  Service: Orthopedics;  Laterality: Left;  WITH SYNOVECTOMY and debridement  . OSTEOTOMY     bilateral tibial    No Known Allergies  Social History   Socioeconomic History  . Marital status:  Married    Spouse name: Scheryl Darter  . Number of children: 3  . Years of education: BA  . Highest education level: Not on file  Occupational History    Employer: [employer]    Comment: [employer]   Tobacco Use  . Smoking status: Never Smoker  . Smokeless tobacco: Never Used  Substance and Sexual Activity  . Alcohol use: Yes    Comment: 3 glasses of wine weekly  . Drug use: No  . Sexual activity: Not on file  Other Topics Concern  . Not on file  Social History Narrative   Patient lives at home with family.   Caffeine Use: 4-5 sodas weekly   Social Determinants of Health   Financial Resource Strain:   . Difficulty of Paying Living Expenses: Not on file  Food Insecurity:   .  Worried About Charity fundraiser in the Last Year: Not on file  . Ran Out of Food in the Last Year: Not on file  Transportation Needs:   . Lack of Transportation (Medical): Not on file  . Lack of Transportation (Non-Medical): Not on file  Physical Activity:   . Days of Exercise per Week: Not on file  . Minutes of Exercise per Session: Not on file  Stress:   . Feeling of Stress : Not on file  Social Connections:   . Frequency of Communication with Friends and Family: Not on file  . Frequency of Social Gatherings with Friends and Family: Not on file  . Attends Religious Services: Not on file  . Active Member of Clubs or Organizations: Not on file  . Attends Archivist Meetings: Not on file  . Marital Status: Not on file  Intimate Partner Violence:   . Fear of Current or Ex-Partner: Not on file  . Emotionally Abused: Not on file  . Physically Abused: Not on file  . Sexually Abused: Not on file    Family History  Problem Relation Age of Onset  . Stroke Mother        hemorrhagic  . Hypertension Father   . Cancer - Prostate Father   . Atrial fibrillation Father   . Congestive Heart Failure Father   . Anorexia nervosa Sister   . Heart attack Maternal Grandmother   . Stroke Maternal Grandfather   . Cancer - Colon Paternal Grandmother   . Cancer - Colon Paternal Grandfather   . Healthy Daughter   . Healthy Daughter   . Healthy Daughter     BP 110/72   Ht 5' 5.5" (1.664 m)   Wt 130 lb (59 kg)   BMI 21.30 kg/m   Review of Systems: See HPI above.     Objective:  Physical Exam:  General: Alert and oriented x3, NAD, comfortable in exam room HEENT: normocephalic and atraumatic, PERRLA, EOMI, conjunctivae and sclerae clear, vision grossly intact, MMM, no erythema or exudates, trachea midline, Cardio: RRR, normal S1/S2 with no M/R/G,  Resp: breathing easily on RA, lungs clear to auscultation bilaterally,   Right Knee -no effusion/bruising/bony abnormalities.   Healed surgical scar from osteotomy -no TTP at medial or lateral joint line/patella/pre-patellar or pes anserine bursae -active and passive ROM intact with flexion to 135 degrees and extension to near 0 degrees -strength 5/5 on flexion/extension/abduction/adduction -negative Lachman's, negative anterior drawer, stable to varus and valgus stress -negative McMurray's, negative Thessaly test   Right Hip -no swelling, bruising -no TTP over trochanteric bursa or SI joint -notable hamstring tightness -complete  active/passive ROM flexion to 110, extension to 20 degrees, abduction 45 degrees, internal rotation to 30 degrees, external rotation to 50 degrees -strength 4/5 on abduction, 5/5 flexion, extension Negative logroll, fadir, faber.  Assessment & Plan:   Nicole Oneill is a 59 y.o. female with Nicole Oneill presenting with months of right hip and knee popping/clicking without pain. Knee and hip exam are normal without pain or instability. Reassured patient that there does not appear to be any pathology associated with audible sounds of popping but rather this is more consistent with snapping hip syndrome, possible mild instability of knee from EDS vs patellar maltracking.  Recommended strengthening exercises with PT of of gluteus medius and stretching of calf ond hamstring. May continue taking meloxicam and gabapentin for other previous diagnoses as this is not masking pain as was a patient concern.   Hilario Quarry MS4

## 2019-09-09 NOTE — Patient Instructions (Signed)
Your exam is reassuring. Do the home exercises for your snapping hip and your knee daily. Stretching focused on hamstring and calf only - hold 20-30 seconds, repeat 3 times. Icing, meloxicam as needed for these issues. Consider formal physical therapy if not improving as expected. Follow up with me in 6 weeks.

## 2019-09-10 ENCOUNTER — Ambulatory Visit: Payer: BLUE CROSS/BLUE SHIELD | Admitting: Cardiovascular Disease

## 2019-09-10 ENCOUNTER — Encounter: Payer: Self-pay | Admitting: Cardiovascular Disease

## 2019-09-10 DIAGNOSIS — R0789 Other chest pain: Secondary | ICD-10-CM | POA: Diagnosis not present

## 2019-09-10 DIAGNOSIS — Q7962 Hypermobile Ehlers-Danlos syndrome: Secondary | ICD-10-CM

## 2019-09-10 DIAGNOSIS — I248 Other forms of acute ischemic heart disease: Secondary | ICD-10-CM

## 2019-09-10 DIAGNOSIS — G4733 Obstructive sleep apnea (adult) (pediatric): Secondary | ICD-10-CM

## 2019-09-10 DIAGNOSIS — I2584 Coronary atherosclerosis due to calcified coronary lesion: Secondary | ICD-10-CM

## 2019-09-10 DIAGNOSIS — R0609 Other forms of dyspnea: Secondary | ICD-10-CM

## 2019-09-10 DIAGNOSIS — I2510 Atherosclerotic heart disease of native coronary artery without angina pectoris: Secondary | ICD-10-CM

## 2019-09-10 DIAGNOSIS — E785 Hyperlipidemia, unspecified: Secondary | ICD-10-CM | POA: Diagnosis not present

## 2019-09-10 DIAGNOSIS — R0600 Dyspnea, unspecified: Secondary | ICD-10-CM

## 2019-09-10 NOTE — Patient Instructions (Signed)
Medication Instructions:  CONTINUE WITH CURRENT MEDICATIONS. NO CHANGES.  *If you need a refill on your cardiac medications before your next appointment, please call your pharmacy*   Lab Work: IN 6 MONTHS: FASTING LABS- CMET LIPID If you have labs (blood work) drawn today and your tests are completely normal, you will receive your results only by: Marland Kitchen MyChart Message (if you have MyChart) OR . A paper copy in the mail If you have any lab test that is abnormal or we need to change your treatment, we will call you to review the results.  Follow-Up: At [facility], you and your health needs are our priority.  As part of our continuing mission to provide you with exceptional heart care, we have created designated Provider Care Teams.  These Care Teams include your primary Cardiologist (physician) and Advanced Practice Providers (APPs -  Physician Assistants and Nurse Practitioners) who all work together to provide you with the care you need, when you need it.  We recommend signing up for the patient portal called "MyChart".  Sign up information is provided on this After Visit Summary.  MyChart is used to connect with patients for Virtual Visits (Telemedicine).  Patients are able to view lab/test results, encounter notes, upcoming appointments, etc.  Non-urgent messages can be sent to your provider as well.   To learn more about what you can do with MyChart, go to [email or web address].    Your next appointment:   6 month(s)  The format for your next appointment:   In Person  Provider:   [name], MD

## 2019-09-10 NOTE — Progress Notes (Signed)
Cardiology Office Note    Date:  09/12/2019   ID:  Nicole Oneill, DOB Jun 23, 1961, MRN [id number]  PCP:  Nicole Infante, MD  Cardiologist:  Nicole Majestic, MD    History of Present Illness:  Nicole Oneill is a 59 y.o. female  who I had initially seen in 2007, and saw again in 2015.  I saw her on June 01, 2019  following her recent ER and overnight hospitalization for chest pain.  She presents for 3-1/40-monthfollow-up evaluation.  Ms. KRaburnhas Ehlers-Danlos syndrome, hypermobility type III and has had significant  orthopedic issues, a presumptive history of fibromyalgia, as well as Sjogren syndrome.  I had seen her in 2007 when she was experiencing episodes of palpitations and vague chest pain.  Prior to that visit, she had undergone numerous orthopedic procedures with cervical as well as lumbar fusions in addition to knee replacement.  An echo Doppler study in November 2007 revealed normal LV size and function.  She had mild mitral annular calcification.  Her mitral valve was not my myxomatous in appearance and she had evidence for mild mitral regurgitation.  There was trivial tricuspid regurgitation.  Her aortic root dimension was normal and measured 2.3 cm in diameter.  A nuclear perfusion study showed normal perfusion in all regions.  A cardiac monitor showed occasional episodes of sinus tachycardia as well as episodes of PACs, and short bursts of atrial tachycardia without ventricular ectopy.  I saw her when she reestablished with me in 2015 and at that time she had at least 18 surgeries in the last 12 years and [facility]  Due to her hypermobility, osteoarthritis, intervertebral  disc disease and joint pains she ultimately was referred by Dr BAndreas Blowerto [facility]  She was diagnosed with Ehlers-Danlos syndrome, hypermobility type III .  She also was seen in the genetics clinic evaluation.  Because of the  association of aortic root dilatation.  She now presents for cardiologic reevaluation.  When I saw her in 2015, she denied any chest pain and was unaware of any palpitations, presyncope or syncope.  It was recommended by Dr. JMeryl Darethat she take take vitamin D and calcium supplements, vitamin C 1000 mg daily , which aids in the cross-linking of collagen fibrils and prolongs life of normal collagen, Epsom salt baths when she has musculoskeletal pain as well as potential oral magnesium supplementation.  From a cardiac standpoint, Nicole Freehad been doing pretty well but admits that she often gets short of breath with minimal activity but she is not been able to exercise significantly and this is not any change.  On November 9 she was leaning forward doing Pilates exercise when she began to develop substernal chest pain.  She stopped the exercise and drove home.  Her chest pain increased and radiated to her left arm.  She also noticed significant diaphoresis and developed nausea and vomiting.  Due to progression to severe chest tightness EMS was contacted and she was taken to the emergency room.  Due to her airless Danlos history she underwent CT imaging which did not disclose any abnormality of her aorta and there was no evidence for dissection.  High-sensitivity troponins were minimally increased in a flat plateau at 43.> 53>42> 34.   Her ECG did not reveal any ST abnormality but showed QS complex anteroseptally.  She was seen by Dr. NMeda Coffeeand subsequently underwent CTA imaging and was  found to have a calcium score of 38 which was 81st percentile for age and sex matched control.  There was minimal nonobstructive plaque of 0 to 24% at the ostium of her large dominant RCA.  Remaining vessels were without any plaque.  A 2D echo Doppler study done on May 18, 2019 showed hyperdynamic LV function with an EF estimated 65 to 70% without focal segmental wall motion abnormalities.  There was trace mitral regurgitation  and trace tricuspid regurgitation.  Her aorta was normal dimension and there was no evidence for dilation or obstruction.  During her hospitalization lipid studies were notable for total cholesterol of 221, HDL 78, LDL 133 and triglycerides 52.  She was advised to take Crestor 5 mg 3 times per week.  Subsequent to her ER evaluation she was seen by Dr. Elta Guadeloupe Oneill who advised cardiology follow-up and raised concern for possible Takotsubo cardiomyopathy.    I saw her for reestablishment of cardiology care on  June 01, 2019.  At that time she denied any recurrent chest pain symptomatology.  She had been on hormone replacement therapy for over 10 years but stopped therapy approximately 4 months ago.  She continued to experience mild shortness of breath with walking.  At that time she also stated that she had seen Dr. Orlie Oneill for obstructive sleep apnea and was previously diagnosed of having mild OSA.  She has had issues with a comfortable mask.  During her evaluation, I thoroughly reviewed her image studies including her echo Doppler and CTA evaluations.  Her Doppler study showed hyperdynamic LV function without wall motion abnormalities.  There was no evidence for apical ballooning or apical hypocontractility which I felt argued against Takotsubo cardiomyopathy.  Her coronary CTA revealed mild coronary calcification which I reviewed with her in detail.  With her LDL of 133 I recommended aggressive lipid-lowering therapy in order to achieve a target less than 70 in an attempt to hopefully induce plaque stability and regression.  I recommended she titrate her Crestor from 5 mg 3 times a week to every other day and if tolerates to daily.  Since her blood pressure was elevated I added low-dose amlodipine 2.5 mg daily both for optimal blood pressure as well as potential vasospasm benefit.  I also discussed new mask technology and suggested she discussed the consideration of a ResMed AirFit N 30i mask with Dr.  Elenore Oneill.  Since I last saw her she has felt well.  She denies any chest pain PND orthopnea.  She is unaware of palpitations.  She apparently stopped taking simvastatin and started atorvastatin but has been taking this 10 mg daily.  She underwent repeat lipid studies on August 30, 2018 which shows marked improvement with total cholesterol now 128.  Her LDL cholesterol has improved from 33 down to 48.  She presents for reevaluation.  Past Medical History:  Diagnosis Date  . Anemia   . Arthritis    osteo  . Atrial flutter ([place])    x 10 years ago wias diagnosed with A. Flutter while wearing a heart monitor.  No problems since.   . Complication of anesthesia    urinary retention requiring indwelling  foley catheterization  . DDD (degenerative disc disease)   . Ehlers-Danlos syndrome type III   . Fibromyalgia   . History of blood transfusion   . Osteoarthritis     Past Surgical History:  Procedure Laterality Date  . BACK SURGERY     Cervical fusion C5-7, fusion  T10-S1  .  DIAGNOSTIC LAPAROSCOPY     x 3  . EYE SURGERY     bilateral tear duct fusion  . JOINT REPLACEMENT     partial left knee 2005, total left knee 2012  . KNEE ARTHROPLASTY    . KNEE ARTHROSCOPY  08/06/2012   Procedure: ARTHROSCOPY KNEE;  Surgeon: Gearlean Alf, MD;  Location: [facility];  Service: Orthopedics;  Laterality: Left;  WITH SYNOVECTOMY and debridement  . OSTEOTOMY     bilateral tibial    Current Medications: Outpatient Medications Prior to Visit  Medication Sig Dispense Refill  . amLODipine (NORVASC) 2.5 MG tablet Take 1 tablet (2.5 mg total) by mouth daily. 180 tablet 3  . atorvastatin (LIPITOR) 10 MG tablet Take 1 tablet (10 mg total) by mouth daily. 90 tablet 3  . Calcium Carbonate-Vitamin D (CALTRATE 600+D PO) Take 1 tablet by mouth daily.     . Cholecalciferol (VITAMIN D3) 2000 units capsule Take 2,000 Units by mouth daily.    Marland Kitchen doxycycline (ADOXA) 100 MG tablet Take 100 mg by mouth at bedtime as  needed.     . gabapentin (NEURONTIN) 300 MG capsule Take two tabs at bedtime for nerve pain. 120 capsule 0  . meloxicam (MOBIC) 15 MG tablet TAKE 1 TABLET BY MOUTH TWICE A DAY AS NEEDED (Patient taking differently: Take 7.5 mg by mouth daily. ) 60 tablet 0  . methocarbamol (ROBAXIN) 750 MG tablet TAKE 1 TABLET BY MOUTH EVERYDAY AT BEDTIME 90 tablet 0  . Probiotic Product (PROBIOTIC PO) Take 1 capsule by mouth daily.    . Psyllium (METAMUCIL PO) Take 1 tablet by mouth as needed.     . zolpidem (AMBIEN) 5 MG tablet Take 1 tablet (5 mg total) by mouth at bedtime as needed. for sleep 90 tablet 0   No facility-administered medications prior to visit.     Allergies:   Patient has no known allergies.   Social History   Socioeconomic History  . Marital status: Married    Spouse name: Scheryl Darter  . Number of children: 3  . Years of education: BA  . Highest education level: Not on file  Occupational History    Employer: [employer]    Comment: [employer]   Tobacco Use  . Smoking status: Never Smoker  . Smokeless tobacco: Never Used  Substance and Sexual Activity  . Alcohol use: Yes    Comment: 3 glasses of wine weekly  . Drug use: No  . Sexual activity: Not on file  Other Topics Concern  . Not on file  Social History Narrative   Patient lives at home with family.   Caffeine Use: 4-5 sodas weekly   Social Determinants of Health   Financial Resource Strain:   . Difficulty of Paying Living Expenses: Not on file  Food Insecurity:   . Worried About Charity fundraiser in the Last Year: Not on file  . Ran Out of Food in the Last Year: Not on file  Transportation Needs:   . Lack of Transportation (Medical): Not on file  . Lack of Transportation (Non-Medical): Not on file  Physical Activity:   . Days of Exercise per Week: Not on file  . Minutes of Exercise per Session: Not on file  Stress:   . Feeling of Stress : Not on file  Social Connections:   . Frequency of  Communication with Friends and Family: Not on file  . Frequency of Social Gatherings with Friends and Family: Not on file  .  Attends Religious Services: Not on file  . Active Member of Clubs or Organizations: Not on file  . Attends Archivist Meetings: Not on file  . Marital Status: Not on file     Family History:  The patient's family history includes Anorexia nervosa in her sister; Atrial fibrillation in her father; Cancer - Colon in her paternal grandfather and paternal grandmother; Cancer - Prostate in her father; Congestive Heart Failure in her father; Healthy in her daughter, daughter, and daughter; Heart attack in her maternal grandmother; Hypertension in her father; Stroke in her maternal grandfather and mother.  She believes her mother most likely had Erler's Danlos and sister also has Erler's Danlos syndrome.  ROS General: Negative; No fevers, chills, or night sweats;  HEENT: Negative; No changes in vision or hearing, sinus congestion, difficulty swallowing Pulmonary: Negative; No cough, wheezing, shortness of breath, hemoptysis Cardiovascular: See HPI GI: Negative; No nausea, vomiting, diarrhea, or abdominal pain GU: Negative; No dysuria, hematuria, or difficulty voiding Musculoskeletal: Significant joint issues with multiple back surgeries and ultimate complete fusion of her back from T1-S1 done at [facility] Hematologic/Oncology: Negative; no easy bruising, bleeding Endocrine: Negative; no heat/cold intolerance; no diabetes Neuro: Negative; no changes in balance, headaches Skin: Negative; No rashes or skin lesions Psychiatric: Negative; No behavioral problems, depression Sleep: Positive for OSA and has been on CPAP therapy evaluated by Dr. Elenore Oneill. no daytime sleepiness, hypersomnolence, bruxism, restless legs, hypnogognic hallucinations, no cataplexy Other comprehensive 14 point system review is negative.   PHYSICAL EXAM:   VS:  BP 110/82   Pulse 64   Ht 5' 5.5" (1.664  m)   Wt 128 lb (58.1 kg)   SpO2 99%   BMI 20.98 kg/m     Repeat blood pressure by me was 132/82  Wt Readings from Last 3 Encounters:  09/10/19 128 lb (58.1 kg)  09/09/19 130 lb (59 kg)  08/05/19 130 lb (59 kg)    General: Alert, oriented, no distress.  Skin: normal turgor, no rashes, warm and dry HEENT: Normocephalic, atraumatic. Pupils equal round and reactive to light; sclera anicteric; extraocular muscles intact;  Nose without nasal septal hypertrophy Mouth/Parynx benign; Mallinpatti scale 2 Neck: No JVD, no carotid bruits; normal carotid upstroke Lungs: clear to ausculatation and percussion; no wheezing or rales Chest wall: without tenderness to palpitation Heart: PMI not displaced, RRR, s1 s2 normal, 1/6 systolic murmur, no diastolic murmur, no rubs, gallops, thrills, or heaves Abdomen: soft, nontender; no hepatosplenomehaly, BS+; abdominal aorta nontender and not dilated by palpation. Back: Surgical scar down her entire spine;no CVA tenderness Pulses 2+ Musculoskeletal: full range of motion, normal strength, no joint deformities Extremities: no clubbing cyanosis or edema, Homan's sign negative  Neurologic: grossly nonfocal; Cranial nerves grossly wnl Psychologic: Normal mood and affect   Studies/Labs Reviewed:   EKG:  EKG is  ordered today.  ECG (independently read by me): Normal sinus rhythm at 64 bpm.  QS complex V1 V2.  Nonspecific T wave abnormality in lead III.  Low voltage.  No ectopy.  June 01, 2019 ECG (independently read by me): Sinus bradycardia 58 bpm.  QS complex V1 V2 V3, not significantly changed from her ER tracing of May 18, 2019.  Normal intervals.  No ectopy  Recent Labs: BMP Latest Ref Rng & Units 08/31/2019 05/18/2019 02/09/2019  Glucose 65 - 99 mg/dL 82 102(H) 89  BUN 8 - 27 mg/dL 17 21(H) 23  Creatinine 0.57 - 1.00 mg/dL 0.80 0.75 0.84  BUN/Creat Ratio 12 -  28 21 - NOT APPLICABLE  Sodium 812 - 144 mmol/L 144 139 142  Potassium 3.5 - 5.2  mmol/L 4.4 3.9 4.1  Chloride 96 - 106 mmol/L 105 103 105  CO2 20 - 29 mmol/L '24 25 28  '$ Calcium 8.7 - 10.3 mg/dL 9.1 9.3 9.1     Hepatic Function Latest Ref Rng & Units 08/31/2019 07/28/2019 02/09/2019  Total Protein 6.0 - 8.5 g/dL 6.3 6.5 6.2  Albumin 3.8 - 4.9 g/dL 4.6 - -  AST 0 - 40 IU/L '18 17 18  '$ ALT 0 - 32 IU/L '23 14 12  '$ Alk Phosphatase 39 - 117 IU/L 53 - -  Total Bilirubin 0.0 - 1.2 mg/dL 0.3 0.4 0.4  Bilirubin, Direct 0.0 - 0.2 mg/dL - 0.1 -    CBC Latest Ref Rng & Units 05/18/2019 02/09/2019 04/28/2018  WBC 4.0 - 10.5 K/uL 8.5 4.4 4.0  Hemoglobin 12.0 - 15.0 g/dL 14.4 11.8 12.2  Hematocrit 36.0 - 46.0 % 43.9 35.3 36.0  Platelets 150 - 400 K/uL 222 176 183   Lab Results  Component Value Date   MCV 87.6 05/18/2019   MCV 87.4 02/09/2019   MCV 84.9 04/28/2018   No results found for: TSH No results found for: HGBA1C   BNP No results found for: BNP  ProBNP No results found for: PROBNP   Lipid Panel     Component Value Date/Time   CHOL 128 08/31/2019 0853   TRIG 57 08/31/2019 0853   HDL 67 08/31/2019 0853   CHOLHDL 1.9 08/31/2019 0853   CHOLHDL 2.8 05/19/2019 0504   VLDL 10 05/19/2019 0504   LDLCALC 48 08/31/2019 0853   LABVLDL 13 08/31/2019 0853     RADIOLOGY: No results found.   Additional studies/ records that were reviewed today include:   ECHO 05/18/2019 IMPRESSIONS   1. Left ventricular ejection fraction, by visual estimation, is 65 to 70%. The left ventricle has hyperdynamic function. There is no left ventricular hypertrophy.  2. Global right ventricle has normal systolic function.The right ventricular size is normal. No increase in right ventricular wall thickness.  3. Left atrial size was normal.  4. Right atrial size was normal.  5. The mitral valve is normal in structure. Trace mitral valve regurgitation. No evidence of mitral stenosis.  6. The tricuspid valve is normal in structure. Tricuspid valve regurgitation is trivial.  7. The aortic  valve is normal in structure. Aortic valve regurgitation is not visualized. No evidence of aortic valve sclerosis or stenosis.  8. The pulmonic valve was normal in structure. Pulmonic valve regurgitation is not visualized.  9. The inferior vena cava is normal in size with greater than 50% respiratory variability, suggesting right atrial pressure of 3 mmHg.   CORONARY CTA IMPRESSION: 05/19/2019 1. Coronary calcium score of 38. This was 74 percentile for age and sex matched control.  2. Normal coronary origin with right dominance.  3. CAD-RADS 1. Minimal non-obstructive CAD (0-24%). Consider non-atherosclerotic causes of chest pain. Consider preventive therapy and risk factor modification.   ASSESSMENT:    1. Exertional dyspnea   2. Chest tightness   3. Coronary artery calcification   4. Demand ischemia ([place])   5. Hyperlipidemia with target LDL less than 70   6. Ehlers-Danlos syndrome type III   7. OSA (obstructive sleep apnea)     PLAN:  Ms. Evana Runnels is a very pleasant 59 year old female who has a history of Erler's Danlos type III and has had numerous orthopedic issues contributed by  hypermobility as well as disc disease.  She developed an episode of severe substernal chest tightness associated with left arm radiation, diaphoresis, nausea and vomiting worrisome for potential ischemic etiology leading to a hospital evaluation in November 2020.  Overnight hospitalization revealed a flat minimally increased high-sensitivity troponin curve more suggestive of possible demand ischemia rather than ACS.  However, it appears that her symptoms first occurred when she was leaning over doing  a Pilates move and lifting raising some concern for possible transient vasomotor spasm contributing to her symptomatology.  Her echo Doppler study revealed hyperdynamic LV function without wall motion abnormalities. There was no evidence for apical ballooning or apical hypocontractility arguing against  Takotsubo cardiomyopathy.    Her CTA demonstrated mild coronary calcification involving the ostium of her RCA and her calcium score was mildly increased at 38.  When I saw her in July 2020 I recommended aggressive lipid-lowering therapy.  I further titrated her rosuvastatin and apparently after 2 weeks this was changed to atorvastatin 10 mg daily.  Since over the past several months she has been on atorvastatin 10 mg and lipid studies are now dramatically improved with LDL cholesterol now at 48 compared to 133.  Her blood pressure today is stable.  She has not had any further episodes of chest tightness.  She has tolerated initiation of low-dose amlodipine at 2.5 mg.  I had previously discussed the benefit of continued CPAP therapy use and reviewed with her data regarding potential adverse cardiovascular consequences if untreated.  I have recommended follow-up R in 6 months and will see her in follow-up for further evaluation and recommendations.     Medication Adjustments/Labs and Tests Ordered: Current medicines are reviewed at length with the patient today.  Concerns regarding medicines are outlined above.  Medication changes, Labs and Tests ordered today are listed in the Patient Instructions below. Patient Instructions  Medication Instructions:  CONTINUE WITH CURRENT MEDICATIONS. NO CHANGES.  *If you need a refill on your cardiac medications before your next appointment, please call your pharmacy*   Lab Work: IN 6 MONTHS: FASTING LABS- CMET LIPID If you have labs (blood work) drawn today and your tests are completely normal, you will receive your results only by: Marland Kitchen MyChart Message (if you have MyChart) OR . A paper copy in the mail If you have any lab test that is abnormal or we need to change your treatment, we will call you to review the results.  Follow-Up: At [facility], you and your health needs are our priority.  As part of our continuing mission to provide you with exceptional  heart care, we have created designated Provider Care Teams.  These Care Teams include your primary Cardiologist (physician) and Advanced Practice Providers (APPs -  Physician Assistants and Nurse Practitioners) who all work together to provide you with the care you need, when you need it.  We recommend signing up for the patient portal called "MyChart".  Sign up information is provided on this After Visit Summary.  MyChart is used to connect with patients for Virtual Visits (Telemedicine).  Patients are able to view lab/test results, encounter notes, upcoming appointments, etc.  Non-urgent messages can be sent to your provider as well.   To learn more about what you can do with MyChart, go to [email or web address].    Your next appointment:   6 month(s)  The format for your next appointment:   In Person  Provider:   Shelva Majestic, MD     Signed, Nicole Majestic,  MD  09/12/2019 11:19 AM    [facility] [address], [place], [place], [place]  [address] Phone: [phone]

## 2019-09-12 ENCOUNTER — Encounter: Payer: Self-pay | Admitting: Cardiovascular Disease

## 2019-10-13 DIAGNOSIS — G4733 Obstructive sleep apnea (adult) (pediatric): Secondary | ICD-10-CM | POA: Diagnosis not present

## 2019-10-27 DIAGNOSIS — Z20822 Contact with and (suspected) exposure to covid-19: Secondary | ICD-10-CM | POA: Diagnosis not present

## 2019-10-27 DIAGNOSIS — S1096XA Insect bite of unspecified part of neck, initial encounter: Secondary | ICD-10-CM | POA: Diagnosis not present

## 2019-10-27 DIAGNOSIS — J018 Other acute sinusitis: Secondary | ICD-10-CM | POA: Diagnosis not present

## 2019-10-27 DIAGNOSIS — W57XXXA Bitten or stung by nonvenomous insect and other nonvenomous arthropods, initial encounter: Secondary | ICD-10-CM | POA: Diagnosis not present

## 2019-10-30 ENCOUNTER — Other Ambulatory Visit: Payer: Self-pay

## 2019-10-30 MED ORDER — GABAPENTIN 300 MG PO CAPS: ORAL_CAPSULE | ORAL | 0 refills | Status: DC

## 2019-11-06 ENCOUNTER — Telehealth: Payer: Self-pay | Admitting: Rheumatology

## 2019-11-06 MED ORDER — ZOLPIDEM TARTRATE 5 MG PO TABS: 5.0000 mg | ORAL_TABLET | Freq: Every evening | ORAL | 0 refills | Status: DC | PRN

## 2019-11-06 NOTE — Telephone Encounter (Signed)
Patient called requesting prescription refill of Zolpidem (90 tablets) to be sent to [facility] at [address]

## 2019-11-06 NOTE — Telephone Encounter (Signed)
Last Visit: 07/28/2019 Next Visit: 01/26/2020  Last fill: 07/20/2019   Okay to refill Remus Loffler?

## 2019-11-13 ENCOUNTER — Other Ambulatory Visit: Payer: Self-pay | Admitting: Physician Assistant

## 2019-11-13 ENCOUNTER — Other Ambulatory Visit: Payer: Self-pay | Admitting: Sports Medicine

## 2019-11-15 ENCOUNTER — Other Ambulatory Visit: Payer: Self-pay | Admitting: Rheumatology

## 2019-11-15 ENCOUNTER — Other Ambulatory Visit: Payer: Self-pay | Admitting: Sports Medicine

## 2019-11-16 NOTE — Telephone Encounter (Signed)
Last Visit: 07/28/2019 Next Visit: 01/26/2020  Okay to refill per Dr. Corliss Skains.

## 2019-11-17 DIAGNOSIS — H6123 Impacted cerumen, bilateral: Secondary | ICD-10-CM | POA: Diagnosis not present

## 2019-12-17 ENCOUNTER — Other Ambulatory Visit: Payer: Self-pay | Admitting: Rheumatology

## 2019-12-17 ENCOUNTER — Other Ambulatory Visit: Payer: Self-pay | Admitting: *Deleted

## 2019-12-17 DIAGNOSIS — Z79899 Other long term (current) drug therapy: Secondary | ICD-10-CM

## 2019-12-17 NOTE — Telephone Encounter (Signed)
Last Visit: 07/28/2019 Next Visit: 01/26/2020 Labs: 07/28/2019 Hepatic Function WNL 08/31/2019 CMP Albumin/ Globulin Ratio 2.7 No CBC  Okay to refill Mobic?

## 2019-12-17 NOTE — Telephone Encounter (Signed)
ok 

## 2019-12-28 ENCOUNTER — Other Ambulatory Visit: Payer: Self-pay | Admitting: Rheumatology

## 2019-12-28 ENCOUNTER — Other Ambulatory Visit: Payer: Self-pay | Admitting: Sports Medicine

## 2019-12-29 NOTE — Telephone Encounter (Addendum)
To soon to refill

## 2020-01-13 NOTE — Progress Notes (Signed)
Office Visit Note  Patient: Nicole Oneill             Date of Birth: [birth year]           MRN: [id number]             PCP: [name], MD Referring: [name], MD Visit Date: 01/26/2020 Occupation: @GUAROCC @  Subjective:  Pain in both hands  History of Present Illness: Nicole Oneill is a 59 y.o. female with history of osteoarthritis, DDD, and EDS.  She has been experiencing increased pain in both hands, especially bilateral CMC joints.  She used to have a CMC joint brace, which was helpful but she needs a new one. She has chronic neck and lower back pain.  She states her left knee replacement is doing well. She is going to piliates twice a week.  She is taking tart cherry as recommended.   Activities of Daily Living:  Patient reports morning stiffness for 30  minutes.   Patient Reports nocturnal pain.  Difficulty dressing/grooming: Denies Difficulty climbing stairs: Denies Difficulty getting out of chair: Reports Difficulty using hands for taps, buttons, cutlery, and/or writing: Reports  Review of Systems  Constitutional: Positive for fatigue.  HENT: Positive for mouth dryness. Negative for mouth sores and nose dryness.   Eyes: Positive for dryness. Negative for pain, itching and visual disturbance.  Respiratory: Positive for shortness of breath. Negative for cough, hemoptysis and difficulty breathing.   Cardiovascular: Negative for chest pain, palpitations, hypertension and swelling in legs/feet.  Gastrointestinal: Positive for constipation. Negative for blood in stool and diarrhea.  Endocrine: Negative for increased urination.  Genitourinary: Negative for difficulty urinating and painful urination.  Musculoskeletal: Positive for arthralgias, joint pain, myalgias, morning stiffness, muscle tenderness and myalgias. Negative for joint swelling and muscle weakness.  Skin: Negative for color change, pallor, rash, hair loss, nodules/bumps, redness, skin tightness, ulcers and  sensitivity to sunlight.  Allergic/Immunologic: Negative for susceptible to infections.  Neurological: Positive for numbness and weakness. Negative for dizziness, headaches and memory loss.  Hematological: Positive for bruising/bleeding tendency. Negative for swollen glands.  Psychiatric/Behavioral: Negative for depressed mood, confusion and sleep disturbance. The patient is not nervous/anxious.     PMFS History:  Patient Active Problem List   Diagnosis Date Noted  . Chest pain 05/18/2019  . Right shoulder pain 02/19/2019  . History of sleep apnea 04/28/2018  . DDD (degenerative disc disease), cervical 04/28/2018  . DDD (degenerative disc disease), lumbar 10/29/2017  . Sicca syndrome, unspecified (HCC) 10/29/2017  . Left anterior shoulder pain 11/27/2016  . History of lumbar fusion 10/16/2016  . History of fusion of cervical spine 10/16/2016  . History of total knee arthroplasty, left 2012 10/16/2016  . Other fatigue 10/16/2016  . Primary insomnia 10/16/2016  . DDD lumbar 10/15/2016  . DJD (degenerative joint disease), cervical 10/15/2016  . DDD (degenerative disc disease), thoracic 10/15/2016  . Primary osteoarthritis of both knees 10/15/2016  . Tendinopathy of right gluteus medius 05/29/2016  . Chronic lumbar radiculopathy 03/08/2016  . Heart murmur, systolic 06/19/2014  . Disorder of SI (sacroiliac) joint 06/02/2014  . Functional gait abnormality 06/02/2014  . Ehlers-Danlos syndrome type III 03/10/2014  . Foot arch pain 03/10/2014  . Labral tear of shoulder 05/26/2013  . Left knee pain 05/26/2013  . Primary osteoarthritis of both hands 05/26/2013  . Effusion of knee joint, left 08/05/2012  . Plantar fasciitis 02/05/2011  . Lateral epicondylitis of right elbow 10/12/2010  . Osteoarthritis of multiple joints  10/12/2010    Past Medical History:  Diagnosis Date  . Anemia   . Arthritis    osteo  . Atrial flutter (HCC)    x 10 years ago wias diagnosed with A. Flutter while  wearing a heart monitor.  No problems since.   . Complication of anesthesia    urinary retention requiring indwelling  foley catheterization  . DDD (degenerative disc disease)   . Ehlers-Danlos syndrome type III   . Fibromyalgia   . History of blood transfusion   . Osteoarthritis     Family History  Problem Relation Age of Onset  . Stroke Mother        hemorrhagic  . Hypertension Father   . Cancer - Prostate Father   . Atrial fibrillation Father   . Congestive Heart Failure Father   . Anorexia nervosa Sister   . Heart attack Maternal Grandmother   . Stroke Maternal Grandfather   . Cancer - Colon Paternal Grandmother   . Cancer - Colon Paternal Grandfather   . Healthy Daughter   . Healthy Daughter   . Healthy Daughter    Past Surgical History:  Procedure Laterality Date  . BACK SURGERY     Cervical fusion C5-7, fusion  T10-S1  . DIAGNOSTIC LAPAROSCOPY     x 3  . EYE SURGERY     bilateral tear duct fusion  . JOINT REPLACEMENT     partial left knee 2005, total left knee 2012  . KNEE ARTHROPLASTY    . KNEE ARTHROSCOPY  08/06/2012   Procedure: ARTHROSCOPY KNEE;  Surgeon: Loanne Drilling, MD;  Location: [facility];  Service: Orthopedics;  Laterality: Left;  WITH SYNOVECTOMY and debridement  . OSTEOTOMY     bilateral tibial   Social History   Social History Narrative   Patient lives at home with family.   Caffeine Use: 4-5 sodas weekly    There is no immunization history on file for this patient.   Objective: Vital Signs: BP 116/78 (BP Location: Left Arm, Patient Position: Sitting, Cuff Size: Normal)   Pulse 77   Resp 13   Ht 5' 5.5" (1.664 m)   Wt 126 lb 9.6 oz (57.4 kg)   BMI 20.75 kg/m    Physical Exam Vitals and nursing note reviewed.  Constitutional:      Appearance: She is well-developed.  HENT:     Head: Normocephalic and atraumatic.  Eyes:     Conjunctiva/sclera: Conjunctivae normal.  Pulmonary:     Effort: Pulmonary effort is normal.  Abdominal:      General: Bowel sounds are normal.     Palpations: Abdomen is soft.  Musculoskeletal:     Cervical back: Normal range of motion.  Lymphadenopathy:     Cervical: No cervical adenopathy.  Skin:    General: Skin is warm and dry.     Capillary Refill: Capillary refill takes less than 2 seconds.  Neurological:     Mental Status: She is alert and oriented to person, place, and time.  Psychiatric:        Behavior: Behavior normal.      Musculoskeletal Exam: C-spine, thoracic spine, and lumbar spine good ROM.  Shoulder joints, elbow joints, wrist joints, MCPs, PIPs, and DIPs good ROM with no synovitis. PIP and DIP thickening consistent with osteoarthritis of both hands.  CMC joint prominence bilaterally.  Displacement of right CMC joint.  Hip joints good ROM with no discomfort.  Right knee has good ROM with no warmth or effusion.  Left knee replacement has good ROM with no discomfort.  Ankle joints good ROM with no tenderness or inflammation.   CDAI Exam: CDAI Score: - Patient Global: -; Provider Global: - Swollen: -; Tender: - Joint Exam 01/26/2020   No joint exam has been documented for this visit   There is currently no information documented on the homunculus. Go to the Rheumatology activity and complete the homunculus joint exam.  Investigation: No additional findings.  Imaging: No results found.  Recent Labs: Lab Results  Component Value Date   WBC 8.5 05/18/2019   HGB 14.4 05/18/2019   PLT 222 05/18/2019   NA 144 08/31/2019   K 4.4 08/31/2019   CL 105 08/31/2019   CO2 24 08/31/2019   GLUCOSE 82 08/31/2019   BUN 17 08/31/2019   CREATININE 0.80 08/31/2019   BILITOT 0.3 08/31/2019   ALKPHOS 53 08/31/2019   AST 18 08/31/2019   ALT 23 08/31/2019   PROT 6.3 08/31/2019   ALBUMIN 4.6 08/31/2019   CALCIUM 9.1 08/31/2019   GFRAA 93 08/31/2019    Speciality Comments: No specialty comments available.  Procedures:  No procedures performed Allergies: Patient has no known  allergies.   Assessment / Plan:     Visit Diagnoses: Primary osteoarthritis of both hands: She has PIP and DIP thickening consistent with osteoarthritis of both hands.  CMC joint prominence and displacement of the right CMC joint noted. She has been having increased pain and stiffness in both hands.  She is able to make a complete fist.  No synovitis was noted on exam.  We discussed the importance of joint protection and muscle strengthening.  She was given a handout of hand exercises to perform.  Several of these exercises were performed today in the office.  She was given a prescription for bilateral CMC joint braces.  She was advised to notify us if the discomfort persists or worsens.  She will follow up in 6 months.   History of arthroplasty of left knee: She has good ROM.  Warmth noted on exam.   Trochanteric bursitis of both hips: Resolved.    Sicca syndrome (HCC): She has chronic sicca symptoms which have been tolerable.  DDD (degenerative disc disease), cervical: Chronic pain.  She has good range of motion on exam today.  No symptoms of radiculopathy currently.  DDD (degenerative disc disease), lumbar: Status post fusion.  Chronic pain.  She takes Robaxin 750 mg 1 tablet by mouth at bedtime as needed for muscle spasms.  DDD (degenerative disc disease), thoracic: Status post fusion. Chronic pain.  Ehlers-Danlos syndrome type III  Primary insomnia: She takes Ambien 5 mg 1 tablet by mouth at bedtime as needed for insomnia.  Other fatigue: Chronic and secondary to insomnia.  History of sleep apnea  Orders: No orders of the defined types were placed in this encounter.  No orders of the defined types were placed in this encounter.     Follow-Up Instructions: Return in about 6 months (around 07/28/2020) for Osteoarthritis, DDD.   Pollyann Savoy, MD   scribed by-  Autumn Patty, PA-C  Note - This record has been created using Dragon software.  Chart creation errors have  been sought, but may not always  have been located. Such creation errors do not reflect on  the standard of medical care.

## 2020-01-26 ENCOUNTER — Encounter: Payer: Self-pay | Admitting: Rheumatology

## 2020-01-26 ENCOUNTER — Other Ambulatory Visit: Payer: Self-pay

## 2020-01-26 ENCOUNTER — Other Ambulatory Visit: Payer: Self-pay | Admitting: Obstetrics and Gynecology

## 2020-01-26 ENCOUNTER — Ambulatory Visit (INDEPENDENT_AMBULATORY_CARE_PROVIDER_SITE_OTHER): Payer: BLUE CROSS/BLUE SHIELD | Admitting: Rheumatology

## 2020-01-26 VITALS — BP 116/78 | HR 77 | Resp 13 | Ht 65.5 in | Wt 126.6 lb

## 2020-01-26 DIAGNOSIS — M19041 Primary osteoarthritis, right hand: Secondary | ICD-10-CM | POA: Diagnosis not present

## 2020-01-26 DIAGNOSIS — M7062 Trochanteric bursitis, left hip: Secondary | ICD-10-CM

## 2020-01-26 DIAGNOSIS — M5134 Other intervertebral disc degeneration, thoracic region: Secondary | ICD-10-CM

## 2020-01-26 DIAGNOSIS — R5383 Other fatigue: Secondary | ICD-10-CM

## 2020-01-26 DIAGNOSIS — Z96652 Presence of left artificial knee joint: Secondary | ICD-10-CM

## 2020-01-26 DIAGNOSIS — Z1231 Encounter for screening mammogram for malignant neoplasm of breast: Secondary | ICD-10-CM

## 2020-01-26 DIAGNOSIS — M5030 Other cervical disc degeneration, unspecified cervical region: Secondary | ICD-10-CM

## 2020-01-26 DIAGNOSIS — M7061 Trochanteric bursitis, right hip: Secondary | ICD-10-CM

## 2020-01-26 DIAGNOSIS — Q7962 Hypermobile Ehlers-Danlos syndrome: Secondary | ICD-10-CM

## 2020-01-26 DIAGNOSIS — M19042 Primary osteoarthritis, left hand: Secondary | ICD-10-CM

## 2020-01-26 DIAGNOSIS — M5136 Other intervertebral disc degeneration, lumbar region: Secondary | ICD-10-CM

## 2020-01-26 DIAGNOSIS — Z8669 Personal history of other diseases of the nervous system and sense organs: Secondary | ICD-10-CM

## 2020-01-26 DIAGNOSIS — M3500 Sicca syndrome, unspecified: Secondary | ICD-10-CM

## 2020-01-26 DIAGNOSIS — F5101 Primary insomnia: Secondary | ICD-10-CM

## 2020-01-26 NOTE — Patient Instructions (Signed)
Hand Exercises Hand exercises can be helpful for almost anyone. These exercises can strengthen the hands, improve flexibility and movement, and increase blood flow to the hands. These results can make work and daily tasks easier. Hand exercises can be especially helpful for people who have joint pain from arthritis or have nerve damage from overuse (carpal tunnel syndrome). These exercises can also help people who have injured a hand. Exercises Most of these hand exercises are gentle stretching and motion exercises. It is usually safe to do them often throughout the day. Warming up your hands before exercise may help to reduce stiffness. You can do this with gentle massage or by placing your hands in warm water for 10-15 minutes. It is normal to feel some stretching, pulling, tightness, or mild discomfort as you begin new exercises. This will gradually improve. Stop an exercise right away if you feel sudden, severe pain or your pain gets worse. Ask your health care provider which exercises are best for you. Knuckle bend or "claw" fist 1. Stand or sit with your arm, hand, and all five fingers pointed straight up. Make sure to keep your wrist straight during the exercise. 2. Gently bend your fingers down toward your palm until the tips of your fingers are touching the top of your palm. Keep your big knuckle straight and just bend the small knuckles in your fingers. 3. Hold this position for __________ seconds. 4. Straighten (extend) your fingers back to the starting position. Repeat this exercise 5-10 times with each hand. Full finger fist 1. Stand or sit with your arm, hand, and all five fingers pointed straight up. Make sure to keep your wrist straight during the exercise. 2. Gently bend your fingers into your palm until the tips of your fingers are touching the middle of your palm. 3. Hold this position for __________ seconds. 4. Extend your fingers back to the starting position, stretching every  joint fully. Repeat this exercise 5-10 times with each hand. Straight fist 1. Stand or sit with your arm, hand, and all five fingers pointed straight up. Make sure to keep your wrist straight during the exercise. 2. Gently bend your fingers at the big knuckle, where your fingers meet your hand, and the middle knuckle. Keep the knuckle at the tips of your fingers straight and try to touch the bottom of your palm. 3. Hold this position for __________ seconds. 4. Extend your fingers back to the starting position, stretching every joint fully. Repeat this exercise 5-10 times with each hand. Tabletop 1. Stand or sit with your arm, hand, and all five fingers pointed straight up. Make sure to keep your wrist straight during the exercise. 2. Gently bend your fingers at the big knuckle, where your fingers meet your hand, as far down as you can while keeping the small knuckles in your fingers straight. Think of forming a tabletop with your fingers. 3. Hold this position for __________ seconds. 4. Extend your fingers back to the starting position, stretching every joint fully. Repeat this exercise 5-10 times with each hand. Finger spread 1. Place your hand flat on a table with your palm facing down. Make sure your wrist stays straight as you do this exercise. 2. Spread your fingers and thumb apart from each other as far as you can until you feel a gentle stretch. Hold this position for __________ seconds. 3. Bring your fingers and thumb tight together again. Hold this position for __________ seconds. Repeat this exercise 5-10 times with each hand.   Making circles 1. Stand or sit with your arm, hand, and all five fingers pointed straight up. Make sure to keep your wrist straight during the exercise. 2. Make a circle by touching the tip of your thumb to the tip of your index finger. 3. Hold for __________ seconds. Then open your hand wide. 4. Repeat this motion with your thumb and each finger on your  hand. Repeat this exercise 5-10 times with each hand. Thumb motion 1. Sit with your forearm resting on a table and your wrist straight. Your thumb should be facing up toward the ceiling. Keep your fingers relaxed as you move your thumb. 2. Lift your thumb up as high as you can toward the ceiling. Hold for __________ seconds. 3. Bend your thumb across your palm as far as you can, reaching the tip of your thumb for the small finger (pinkie) side of your palm. Hold for __________ seconds. Repeat this exercise 5-10 times with each hand. Grip strengthening  1. Hold a stress ball or other soft ball in the middle of your hand. 2. Slowly increase the pressure, squeezing the ball as much as you can without causing pain. Think of bringing the tips of your fingers into the middle of your palm. All of your finger joints should bend when doing this exercise. 3. Hold your squeeze for __________ seconds, then relax. Repeat this exercise 5-10 times with each hand. Contact a health care provider if:  Your hand pain or discomfort gets much worse when you do an exercise.  Your hand pain or discomfort does not improve within 2 hours after you exercise. If you have any of these problems, stop doing these exercises right away. Do not do them again unless your health care provider says that you can. Get help right away if:  You develop sudden, severe hand pain or swelling. If this happens, stop doing these exercises right away. Do not do them again unless your health care provider says that you can. This information is not intended to replace advice given to you by your health care provider. Make sure you discuss any questions you have with your health care provider. Document Revised: 10/16/2018 Document Reviewed: 06/26/2018 [employer] Patient Education  2020 [employer]  

## 2020-02-08 ENCOUNTER — Other Ambulatory Visit: Payer: Self-pay | Admitting: Rheumatology

## 2020-02-08 ENCOUNTER — Telehealth: Payer: Self-pay | Admitting: Rheumatology

## 2020-02-08 ENCOUNTER — Other Ambulatory Visit: Payer: Self-pay | Admitting: Physician Assistant

## 2020-02-08 NOTE — Telephone Encounter (Signed)
Called patient to schedule follow-up appointment.  Patient states she was out shopping and will call back to schedule.

## 2020-02-08 NOTE — Telephone Encounter (Signed)
Prescription sent to the pharmacy by Taylor Dale, PA-C °

## 2020-02-08 NOTE — Telephone Encounter (Signed)
Last Visit: 01/26/2020 Next Visit: due January 2022. Message sent to the front to schedule patient.  Last Fill: 11/06/2019  Okay to refill Ambien?

## 2020-02-08 NOTE — Telephone Encounter (Signed)
Patient called requesting prescription refill of Zolpidem to be sent to [facility] at [address]. 

## 2020-02-08 NOTE — Telephone Encounter (Signed)
Please schedule patient for a follow up visit. Patient due January 2022. Thanks!  

## 2020-02-10 DIAGNOSIS — R102 Pelvic and perineal pain: Secondary | ICD-10-CM | POA: Diagnosis not present

## 2020-02-17 DIAGNOSIS — U071 COVID-19: Secondary | ICD-10-CM | POA: Diagnosis not present

## 2020-02-17 DIAGNOSIS — Z03818 Encounter for observation for suspected exposure to other biological agents ruled out: Secondary | ICD-10-CM | POA: Diagnosis not present

## 2020-02-18 ENCOUNTER — Other Ambulatory Visit: Payer: Self-pay | Admitting: *Deleted

## 2020-02-18 DIAGNOSIS — U071 COVID-19: Secondary | ICD-10-CM | POA: Diagnosis not present

## 2020-02-18 MED ORDER — GABAPENTIN 300 MG PO CAPS: ORAL_CAPSULE | ORAL | 0 refills | Status: DC

## 2020-02-19 DIAGNOSIS — Z03818 Encounter for observation for suspected exposure to other biological agents ruled out: Secondary | ICD-10-CM | POA: Diagnosis not present

## 2020-02-21 DIAGNOSIS — Z03818 Encounter for observation for suspected exposure to other biological agents ruled out: Secondary | ICD-10-CM | POA: Diagnosis not present

## 2020-03-07 ENCOUNTER — Other Ambulatory Visit: Payer: Self-pay

## 2020-03-07 ENCOUNTER — Ambulatory Visit
Admission: RE | Admit: 2020-03-07 | Discharge: 2020-03-07 | Disposition: A | Discharge status: Home / Self Care | Payer: BLUE CROSS/BLUE SHIELD | Source: Ambulatory Visit | Attending: Obstetrics and Gynecology | Admitting: Obstetrics and Gynecology

## 2020-03-07 DIAGNOSIS — G4733 Obstructive sleep apnea (adult) (pediatric): Secondary | ICD-10-CM | POA: Diagnosis not present

## 2020-03-07 DIAGNOSIS — Z1231 Encounter for screening mammogram for malignant neoplasm of breast: Secondary | ICD-10-CM

## 2020-03-07 DIAGNOSIS — Q7962 Hypermobile Ehlers-Danlos syndrome: Secondary | ICD-10-CM | POA: Diagnosis not present

## 2020-03-07 DIAGNOSIS — R0600 Dyspnea, unspecified: Secondary | ICD-10-CM | POA: Diagnosis not present

## 2020-03-07 DIAGNOSIS — E785 Hyperlipidemia, unspecified: Secondary | ICD-10-CM | POA: Diagnosis not present

## 2020-03-07 LAB — COMPREHENSIVE METABOLIC PANEL
ALT: 20 IU/L (ref 0–32)
AST: 25 IU/L (ref 0–40)
Albumin/Globulin Ratio: 2.4 — ABNORMAL HIGH (ref 1.2–2.2)
Albumin: 4.5 g/dL (ref 3.8–4.8)
Alkaline Phosphatase: 65 IU/L (ref 48–121)
BUN/Creatinine Ratio: 22 (ref 12–28)
BUN: 20 mg/dL (ref 8–27)
Bilirubin Total: 0.4 mg/dL (ref 0.0–1.2)
CO2: 28 mmol/L (ref 20–29)
Calcium: 9.4 mg/dL (ref 8.7–10.3)
Chloride: 102 mmol/L (ref 96–106)
Creatinine, Ser: 0.89 mg/dL (ref 0.57–1.00)
GFR calc Af Amer: 81 mL/min/{1.73_m2} (ref >59)
GFR calc non Af Amer: 70 mL/min/{1.73_m2} (ref >59)
Globulin, Total: 1.9 g/dL (ref 1.5–4.5)
Glucose: 81 mg/dL (ref 65–99)
Potassium: 3.9 mmol/L (ref 3.5–5.2)
Sodium: 142 mmol/L (ref 134–144)
Total Protein: 6.4 g/dL (ref 6.0–8.5)

## 2020-03-07 LAB — LIPID PANEL
Chol/HDL Ratio: 1.8 ratio (ref 0.0–4.4)
Cholesterol, Total: 168 mg/dL (ref 100–199)
HDL: 92 mg/dL (ref >39)
LDL Chol Calc (NIH): 63 mg/dL (ref 0–99)
Triglycerides: 70 mg/dL (ref 0–149)
VLDL Cholesterol Cal: 13 mg/dL (ref 5–40)

## 2020-03-09 ENCOUNTER — Ambulatory Visit (INDEPENDENT_AMBULATORY_CARE_PROVIDER_SITE_OTHER): Payer: BLUE CROSS/BLUE SHIELD | Admitting: Cardiovascular Disease

## 2020-03-09 ENCOUNTER — Other Ambulatory Visit: Payer: Self-pay

## 2020-03-09 ENCOUNTER — Encounter: Payer: Self-pay | Admitting: Cardiovascular Disease

## 2020-03-09 DIAGNOSIS — I2584 Coronary atherosclerosis due to calcified coronary lesion: Secondary | ICD-10-CM | POA: Diagnosis not present

## 2020-03-09 DIAGNOSIS — I2510 Atherosclerotic heart disease of native coronary artery without angina pectoris: Secondary | ICD-10-CM | POA: Diagnosis not present

## 2020-03-09 DIAGNOSIS — R0600 Dyspnea, unspecified: Secondary | ICD-10-CM

## 2020-03-09 DIAGNOSIS — E785 Hyperlipidemia, unspecified: Secondary | ICD-10-CM | POA: Diagnosis not present

## 2020-03-09 DIAGNOSIS — R0789 Other chest pain: Secondary | ICD-10-CM

## 2020-03-09 DIAGNOSIS — G4733 Obstructive sleep apnea (adult) (pediatric): Secondary | ICD-10-CM

## 2020-03-09 DIAGNOSIS — Q7962 Hypermobile Ehlers-Danlos syndrome: Secondary | ICD-10-CM

## 2020-03-09 DIAGNOSIS — R0609 Other forms of dyspnea: Secondary | ICD-10-CM

## 2020-03-09 NOTE — Patient Instructions (Signed)

## 2020-03-09 NOTE — Progress Notes (Signed)
Cardiology Office Note    Date:  03/12/2020   ID:  Nicole Oneill, DOB [birth year], MRN [id number]  PCP:  Crist Infante, MD  Cardiologist:  Shelva Majestic, MD    History of Present Illness:  Nicole Oneill is a 59 y.o. female  who I had initially seen in 2007, and saw again in 2015.  I saw her on June 01, 2019  following her recent ER and overnight hospitalization for chest pain.  She presents for 3-1/51-month follow-up evaluation.  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  I had seen her in 2007 when she was experiencing episodes of palpitations and vague chest pain.  Prior to that visit, she had undergone numerous orthopedic procedures with cervical as well as lumbar fusions in addition to knee replacement.  An echo Doppler study in November 2007 revealed normal LV size and function.  She had mild mitral annular calcification.  Her mitral valve was not my myxomatous in appearance and she had evidence for mild mitral regurgitation.  There was trivial tricuspid regurgitation.  Her aortic root dimension was normal and measured 2.3 cm in diameter.  A nuclear perfusion study showed normal perfusion in all regions.  A cardiac monitor showed occasional episodes of sinus tachycardia as well as episodes of PACs, and short bursts of atrial tachycardia without ventricular ectopy.  I saw her when she reestablished with me in 2015 and at that time she had at least 18 surgeries in the last 12 years and had recently undergone underwent complete fusion of her back at [facility].  Due to her hypermobility, osteoarthritis, intervertebral  disc disease and joint pains she ultimately was referred by Dr Andreas Blower to Dr. Meryl Dare at [facility].  She was diagnosed with Ehlers-Danlos syndrome, hypermobility type III .  She also was seen in the genetics clinic evaluation.  Because of the  association of aortic root dilatation.  She now presents for cardiologic reevaluation.  When I saw her in 2015, she denied any chest pain and was unaware of any palpitations, presyncope or syncope.  It was recommended by Dr. Meryl Dare that she take take vitamin D and calcium supplements, vitamin C 1000 mg daily , which aids in the cross-linking of collagen fibrils and prolongs life of normal collagen, Epsom salt baths when she has musculoskeletal pain as well as potential oral magnesium supplementation.  From a cardiac standpoint, Nicole Oneill had been doing pretty well but admits that she often gets short of breath with minimal activity but she is not been able to exercise significantly and this is not any change.  On November 9 she was leaning forward doing Pilates exercise when she began to develop substernal chest pain.  She stopped the exercise and drove home.  Her chest pain increased and radiated to her left arm.  She also noticed significant diaphoresis and developed nausea and vomiting.  Due to progression to severe chest tightness EMS was contacted and she was taken to the emergency room.  Due to her Fredderick Phenix- Danlos history she underwent CT imaging which did not disclose any abnormality of her aorta and there was no evidence for dissection.  High-sensitivity troponins were minimally increased in a flat plateau at 43.> 53>42> 34.   Her ECG did not reveal any ST abnormality but showed QS complex anteroseptally.  She was seen by Dr. Meda Coffee and subsequently underwent CTA imaging and was  found to have a calcium score of 38 which was 81st percentile for age and sex matched control.  There was minimal nonobstructive plaque of 0 to 24% at the ostium of her large dominant RCA.  Remaining vessels were without any plaque.  A 2D echo Doppler study done on May 18, 2019 showed hyperdynamic LV function with an EF estimated 65 to 70% without focal segmental wall motion abnormalities.  There was trace mitral regurgitation  and trace tricuspid regurgitation.  Her aorta was normal dimension and there was no evidence for dilation or obstruction.  During her hospitalization lipid studies were notable for total cholesterol of 221, HDL 78, LDL 133 and triglycerides 52.  She was advised to take Crestor 5 mg 3 times per week.  Subsequent to her ER evaluation she was seen by [name] who advised cardiology follow-up and raised concern for possible Takotsubo cardiomyopathy.    I saw her for reestablishment of cardiology care on June 01, 2019.  At that time she denied any recurrent chest pain symptomatology.  She had been on hormone replacement therapy for over 10 years but stopped therapy approximately 4 months ago.  She continued to experience mild shortness of breath with walking.  At that time she also stated that she had seen [name] for obstructive sleep apnea and was previously diagnosed of having mild OSA.  She has had issues with a comfortable mask.  During her evaluation, I thoroughly reviewed her image studies including her echo Doppler and CTA evaluations.  Her Doppler study showed hyperdynamic LV function without wall motion abnormalities.  There was no evidence for apical ballooning or apical hypocontractility which I felt argued against Takotsubo cardiomyopathy.  Her coronary CTA revealed mild coronary calcification which I reviewed with her in detail.  With her LDL of 133 I recommended aggressive lipid-lowering therapy in order to achieve a target less than 70 in an attempt to hopefully induce plaque stability and regression.  I recommended she titrate her Crestor from 5 mg 3 times a week to every other day and if tolerates to daily.  Since her blood pressure was elevated I added low-dose amlodipine 2.5 mg daily both for optimal blood pressure as well as potential vasospasm benefit.  I also discussed new mask technology and suggested she discussed the consideration of a ResMed AirFit N 30i mask with Dr.  [name].  I last saw her September 10, 2019 and since her prior evaluation she had felt well.  She specifically denied any chest pain PND orthopnea and was unaware of palpitations.  .  She apparently stopped taking simvastatin and started atorvastatin but has been taking this 10 mg daily.  She underwent repeat lipid studies on August 30, 2018 which shows marked improvement with total cholesterol now 128.  Her LDL cholesterol has improved from 133 down to 48.  Over the past 6 months she has remained fairly stable.  She does Pilates approximately 4-5 times per week.  She has had some issues with her right knee and she is status post prior left knee replacement.  There was some concern for possible sciatica down her right leg.  She sees Dr. Darcella Gasman.  She continues to be on amlodipine 2.5 mg daily and atorvastatin 10 mg.  She takes gabapentin for nerve pain.  She presents for evaluation.  Past Medical History:  Diagnosis Date  . Anemia   . Arthritis    osteo  . Atrial flutter ([place])    x 10 years  ago wias diagnosed with A. Flutter while wearing a heart monitor.  No problems since.   . Complication of anesthesia    urinary retention requiring indwelling  foley catheterization  . DDD (degenerative disc disease)   . Ehlers-Danlos syndrome type III   . Fibromyalgia   . History of blood transfusion   . Osteoarthritis     Past Surgical History:  Procedure Laterality Date  . BACK SURGERY     Cervical fusion C5-7, fusion  T10-S1  . DIAGNOSTIC LAPAROSCOPY     x 3  . EYE SURGERY     bilateral tear duct fusion  . JOINT REPLACEMENT     partial left knee 2005, total left knee 2012  . KNEE ARTHROPLASTY    . KNEE ARTHROSCOPY  08/06/2012   Procedure: ARTHROSCOPY KNEE;  Surgeon: Loanne Drilling, MD;  Location: [facility];  Service: Orthopedics;  Laterality: Left;  WITH SYNOVECTOMY and debridement  . OSTEOTOMY     bilateral tibial    Current Medications: Outpatient Medications Prior to Visit  Medication  Sig Dispense Refill  . amLODipine (NORVASC) 2.5 MG tablet Take 1 tablet (2.5 mg total) by mouth daily. 180 tablet 3  . atorvastatin (LIPITOR) 10 MG tablet Take 1 tablet (10 mg total) by mouth daily. 90 tablet 3  . Calcium Carbonate-Vitamin D (CALTRATE 600+D PO) Take 1 tablet by mouth daily.     . Cholecalciferol (VITAMIN D3) 2000 units capsule Take 2,000 Units by mouth daily.    Marland Kitchen gabapentin (NEURONTIN) 300 MG capsule Take 2 tabs at bedtime for nerve pain 60 capsule 0  . meloxicam (MOBIC) 15 MG tablet TAKE 1 TABLET BY MOUTH TWICE A DAY AS NEEDED 60 tablet 0  . methocarbamol (ROBAXIN) 750 MG tablet TAKE 1 TABLET BY MOUTH EVERYDAY AT BEDTIME 90 tablet 0  . Probiotic Product (PROBIOTIC PO) Take 1 capsule by mouth daily.    . Psyllium (METAMUCIL PO) Take 1 tablet by mouth as needed.     . zolpidem (AMBIEN) 5 MG tablet TAKE 1 TABLET (5 MG TOTAL) BY MOUTH AT BEDTIME AS NEEDED. FOR SLEEP 90 tablet 0   No facility-administered medications prior to visit.     Allergies:   Patient has no known allergies.   Social History   Socioeconomic History  . Marital status: Married    Spouse name: [name]  . Number of children: 3  . Years of education: BA  . Highest education level: Not on file  Occupational History    Employer: [employer]    Comment: [employer]   Tobacco Use  . Smoking status: Never Smoker  . Smokeless tobacco: Never Used  Vaping Use  . Vaping Use: Never used  Substance and Sexual Activity  . Alcohol use: Yes    Comment: 3 glasses of wine weekly  . Drug use: No  . Sexual activity: Not on file  Other Topics Concern  . Not on file  Social History Narrative   Patient lives at home with family.   Caffeine Use: 4-5 sodas weekly   Social Determinants of Health   Financial Resource Strain:   . Difficulty of Paying Living Expenses: Not on file  Food Insecurity:   . Worried About Programme researcher, broadcasting/film/video in the Last Year: Not on file  . Ran Out of Food in the Last Year:  Not on file  Transportation Needs:   . Lack of Transportation (Medical): Not on file  . Lack of Transportation (Non-Medical): Not on  file  Physical Activity:   . Days of Exercise per Week: Not on file  . Minutes of Exercise per Session: Not on file  Stress:   . Feeling of Stress : Not on file  Social Connections:   . Frequency of Communication with Friends and Family: Not on file  . Frequency of Social Gatherings with Friends and Family: Not on file  . Attends Religious Services: Not on file  . Active Member of Clubs or Organizations: Not on file  . Attends Archivist Meetings: Not on file  . Marital Status: Not on file     Family History:  The patient's family history includes Anorexia nervosa in her sister; Atrial fibrillation in her father; Cancer - Colon in her paternal grandfather and paternal grandmother; Cancer - Prostate in her father; Congestive Heart Failure in her father; Healthy in her daughter, daughter, and daughter; Heart attack in her maternal grandmother; Hypertension in her father; Stroke in her maternal grandfather and mother.  She believes her mother most likely had Erler's Danlos and sister also has Erler's Danlos syndrome.  ROS General: Negative; No fevers, chills, or night sweats;  HEENT: Negative; No changes in vision or hearing, sinus congestion, difficulty swallowing Pulmonary: Negative; No cough, wheezing, shortness of breath, hemoptysis Cardiovascular: See HPI GI: Negative; No nausea, vomiting, diarrhea, or abdominal pain GU: Negative; No dysuria, hematuria, or difficulty voiding Musculoskeletal: Significant joint issues with multiple back surgeries and ultimate complete fusion of her back from T1-S1 done at [facility] Hematologic/Oncology: Negative; no easy bruising, bleeding Endocrine: Negative; no heat/cold intolerance; no diabetes Neuro: Negative; no changes in balance, headaches Skin: Negative; No rashes or skin lesions Psychiatric: Negative; No  behavioral problems, depression Sleep: Positive for OSA and has been on CPAP therapy evaluated by Dr. [name]. no daytime sleepiness, hypersomnolence, bruxism, restless legs, hypnogognic hallucinations, no cataplexy Other comprehensive 14 point system review is negative.   PHYSICAL EXAM:   VS:  BP 94/62   Pulse 60   Ht $R'5\' 5"'YM$  (1.651 m)   Wt 128 lb (58.1 kg)   SpO2 100%   BMI 21.30 kg/m     Repeat blood pressure by me was 112/70 supine 112/68 standing; there was no significant orthostatic pulse rise  Wt Readings from Last 3 Encounters:  03/09/20 128 lb (58.1 kg)  01/26/20 126 lb 9.6 oz (57.4 kg)  09/10/19 128 lb (58.1 kg)    General: Alert, oriented, no distress.  Skin: normal turgor, no rashes, warm and dry HEENT: Normocephalic, atraumatic. Pupils equal round and reactive to light; sclera anicteric; extraocular muscles intact;  Nose without nasal septal hypertrophy Mouth/Parynx benign; Mallinpatti scale 2 Neck: No JVD, no carotid bruits; normal carotid upstroke Lungs: clear to ausculatation and percussion; no wheezing or rales Chest wall: without tenderness to palpitation Heart: PMI not displaced, RRR, s1 s2 normal, 1/6 systolic murmur, no diastolic murmur, no rubs, gallops, thrills, or heaves Abdomen: soft, nontender; no hepatosplenomehaly, BS+; abdominal aorta nontender and not dilated by palpation. Back: no CVA tenderness; surgical scar down her entire spine Pulses 2+ Musculoskeletal: full range of motion, normal strength, no joint deformities Extremities: no clubbing cyanosis or edema, Homan's sign negative  Neurologic: grossly nonfocal; Cranial nerves grossly wnl Psychologic: Normal mood and affect   Studies/Labs Reviewed:   EKG:  EKG is  ordered today. ECG (independently read by me): NSR at 60; no ST changes, normal intervals    September 10, 2019  ECG (independently read by me): Normal sinus rhythm at 64  bpm.  QS complex V1 V2.  Nonspecific T wave abnormality in lead III.   Low voltage.  No ectopy.  June 01, 2019 ECG (independently read by me): Sinus bradycardia 58 bpm.  QS complex V1 V2 V3, not significantly changed from her ER tracing of May 18, 2019.  Normal intervals.  No ectopy  Recent Labs: BMP Latest Ref Rng & Units 03/07/2020 08/31/2019 05/18/2019  Glucose 65 - 99 mg/dL 81 82 102(H)  BUN 8 - 27 mg/dL 20 17 21(H)  Creatinine 0.57 - 1.00 mg/dL 0.89 0.80 0.75  BUN/Creat Ratio 12 - $Re'28 22 21 'lCa$ -  Sodium 134 - 144 mmol/L 142 144 139  Potassium 3.5 - 5.2 mmol/L 3.9 4.4 3.9  Chloride 96 - 106 mmol/L 102 105 103  CO2 20 - 29 mmol/L $RemoveB'28 24 25  'ImRyvdKT$ Calcium 8.7 - 10.3 mg/dL 9.4 9.1 9.3     Hepatic Function Latest Ref Rng & Units 03/07/2020 08/31/2019 07/28/2019  Total Protein 6.0 - 8.5 g/dL 6.4 6.3 6.5  Albumin 3.8 - 4.8 g/dL 4.5 4.6 -  AST 0 - 40 IU/L $Remov'25 18 17  'ezaqdk$ ALT 0 - 32 IU/L $Remov'20 23 14  'eyStKe$ Alk Phosphatase 48 - 121 IU/L 65 53 -  Total Bilirubin 0.0 - 1.2 mg/dL 0.4 0.3 0.4  Bilirubin, Direct 0.0 - 0.2 mg/dL - - 0.1    CBC Latest Ref Rng & Units 05/18/2019 02/09/2019 04/28/2018  WBC 4.0 - 10.5 K/uL 8.5 4.4 4.0  Hemoglobin 12.0 - 15.0 g/dL 14.4 11.8 12.2  Hematocrit 36 - 46 % 43.9 35.3 36.0  Platelets 150 - 400 K/uL 222 176 183   Lab Results  Component Value Date   MCV 87.6 05/18/2019   MCV 87.4 02/09/2019   MCV 84.9 04/28/2018   No results found for: TSH No results found for: HGBA1C   BNP No results found for: BNP  ProBNP No results found for: PROBNP   Lipid Panel     Component Value Date/Time   CHOL 168 03/07/2020 0806   TRIG 70 03/07/2020 0806   HDL 92 03/07/2020 0806   CHOLHDL 1.8 03/07/2020 0806   CHOLHDL 2.8 05/19/2019 0504   VLDL 10 05/19/2019 0504   LDLCALC 63 03/07/2020 0806   LABVLDL 13 03/07/2020 0806     RADIOLOGY: MM 3D SCREEN BREAST BILATERAL  Result Date: 03/08/2020 CLINICAL DATA:  Screening. EXAM: DIGITAL SCREENING BILATERAL MAMMOGRAM WITH TOMO AND CAD COMPARISON:  Previous exam(s). ACR Breast Density Category c: The  breast tissue is heterogeneously dense, which may obscure small masses. FINDINGS: There are no findings suspicious for malignancy. Images were processed with CAD. IMPRESSION: No mammographic evidence of malignancy. A result letter of this screening mammogram will be mailed directly to the patient. RECOMMENDATION: Screening mammogram in one year. (Code:SM-B-01Y) BI-RADS CATEGORY  1: Negative. Electronically Signed   By: Ammie Ferrier M.D.   On: 03/08/2020 14:24     Additional studies/ records that were reviewed today include:   ECHO 05/18/2019 IMPRESSIONS   1. Left ventricular ejection fraction, by visual estimation, is 65 to 70%. The left ventricle has hyperdynamic function. There is no left ventricular hypertrophy.  2. Global right ventricle has normal systolic function.The right ventricular size is normal. No increase in right ventricular wall thickness.  3. Left atrial size was normal.  4. Right atrial size was normal.  5. The mitral valve is normal in structure. Trace mitral valve regurgitation. No evidence of mitral stenosis.  6. The tricuspid valve is normal in structure.  Tricuspid valve regurgitation is trivial.  7. The aortic valve is normal in structure. Aortic valve regurgitation is not visualized. No evidence of aortic valve sclerosis or stenosis.  8. The pulmonic valve was normal in structure. Pulmonic valve regurgitation is not visualized.  9. The inferior vena cava is normal in size with greater than 50% respiratory variability, suggesting right atrial pressure of 3 mmHg.   CORONARY CTA IMPRESSION: 05/19/2019 1. Coronary calcium score of 38. This was 57 percentile for age and sex matched control.  2. Normal coronary origin with right dominance.  3. CAD-RADS 1. Minimal non-obstructive CAD (0-24%). Consider non-atherosclerotic causes of chest pain. Consider preventive therapy and risk factor modification.   ASSESSMENT:    1. Coronary artery calcification   2.  Exertional dyspnea   3. Chest tightness   4. Hyperlipidemia with target LDL less than 70   5. Ehlers-Danlos syndrome type III   6. OSA (obstructive sleep apnea)     PLAN:  Nicole Oneill is a very pleasant 59 year old female who has a history of Erler's Danlos type III and has had numerous orthopedic issues contributed by hypermobility as well as disc disease.  She developed an episode of severe substernal chest tightness associated with left arm radiation, diaphoresis, nausea and vomiting worrisome for potential ischemic etiology leading to a hospital evaluation in November 2020.  Overnight hospitalization revealed a flat minimally increased high-sensitivity troponin curve more suggestive of possible demand ischemia rather than ACS.  However, it appears that her symptoms first occurred when she was leaning over doing a Pilates move and lifting raising some concern for possible transient vasomotor spasm contributing to her symptomatology.  Her echo Doppler study revealed hyperdynamic LV function without wall motion abnormalities. There was no evidence for apical ballooning or apical hypocontractility arguing against Takotsubo cardiomyopathy. Her CTA demonstrated mild coronary calcification involving the ostium of her RCA and her calcium score was mildly increased at 38.  When I saw her in July 2020 I recommended aggressive lipid-lowering therapy.  I further titrated her rosuvastatin and apparently after 2 weeks this was changed to atorvastatin 10 mg daily.  Since over the past several months she has been on atorvastatin 10 mg and lipid studies are now dramatically improved with LDL cholesterol reduced to 48 compared to 133.  Her blood pressure today is stable.  She has not had any further episodes of chest tightness.  She has tolerated initiation of low-dose amlodipine at 2.5 mg.  Since her last evaluation she continues to be stable cardiovascularly.  Her blood pressure today when checked by me was stable  without orthostatic change and this typically runs on the low side.  She has not had any recurrent episodes of chest tightness.  I do not believe her knee pain is due to atorvastatin and particularly with her coronary calcification I have recommended continued aggressive therapy with target LDL less than 70 in attempt to induce plaque regression and prevent progression.  She will be undergoing evaluation for possible right lower extremity sciatica.  Oxygen saturation is excellent at 100% in the office today.  I will see her in 1 year for reevaluation or sooner if necessary.  She will return to the primary care of Dr. Crist Infante and is followed by [name] for OSA.   Medication Adjustments/Labs and Tests Ordered: Current medicines are reviewed at length with the patient today.  Concerns regarding medicines are outlined above.  Medication changes, Labs and Tests ordered today are listed in  the Patient Instructions below. Patient Instructions  Medication Instructions:  CONTINUE WITH CURRENT MEDICATIONS. NO CHANGES.  *If you need a refill on your cardiac medications before your next appointment, please call your pharmacy*  Follow-Up: At [facility], you and your health needs are our priority.  As part of our continuing mission to provide you with exceptional heart care, we have created designated Provider Care Teams.  These Care Teams include your primary Cardiologist (physician) and Advanced Practice Providers (APPs -  Physician Assistants and Nurse Practitioners) who all work together to provide you with the care you need, when you need it.  We recommend signing up for the patient portal called "MyChart".  Sign up information is provided on this After Visit Summary.  MyChart is used to connect with patients for Virtual Visits (Telemedicine).  Patients are able to view lab/test results, encounter notes, upcoming appointments, etc.  Non-urgent messages can be sent to your provider as well.   To  learn more about what you can do with MyChart, go to [email or web address].    Your next appointment:   12 month(s)  The format for your next appointment:   In Person  Provider:   Shelva Majestic, MD       Signed, Shelva Majestic, MD  03/12/2020 4:06 PM    [facility] [address], [place], [place], [place]  [address] Phone: [phone]

## 2020-03-10 DIAGNOSIS — N888 Other specified noninflammatory disorders of cervix uteri: Secondary | ICD-10-CM | POA: Diagnosis not present

## 2020-03-10 DIAGNOSIS — R9389 Abnormal findings on diagnostic imaging of other specified body structures: Secondary | ICD-10-CM | POA: Diagnosis not present

## 2020-03-12 ENCOUNTER — Encounter: Payer: Self-pay | Admitting: Cardiovascular Disease

## 2020-03-15 DIAGNOSIS — L814 Other melanin hyperpigmentation: Secondary | ICD-10-CM | POA: Diagnosis not present

## 2020-03-15 DIAGNOSIS — L821 Other seborrheic keratosis: Secondary | ICD-10-CM | POA: Diagnosis not present

## 2020-03-15 DIAGNOSIS — L718 Other rosacea: Secondary | ICD-10-CM | POA: Diagnosis not present

## 2020-03-15 DIAGNOSIS — D1801 Hemangioma of skin and subcutaneous tissue: Secondary | ICD-10-CM | POA: Diagnosis not present

## 2020-03-20 ENCOUNTER — Other Ambulatory Visit: Payer: Self-pay | Admitting: Rheumatology

## 2020-03-21 NOTE — Telephone Encounter (Addendum)
Patient to contact the office.

## 2020-03-24 ENCOUNTER — Other Ambulatory Visit: Payer: Self-pay

## 2020-03-24 ENCOUNTER — Ambulatory Visit
Admission: RE | Admit: 2020-03-24 | Discharge: 2020-03-24 | Disposition: A | Discharge status: Home / Self Care | Payer: BLUE CROSS/BLUE SHIELD | Source: Ambulatory Visit | Attending: Sports Medicine | Admitting: Sports Medicine

## 2020-03-24 ENCOUNTER — Encounter: Payer: Self-pay | Admitting: Sports Medicine

## 2020-03-24 ENCOUNTER — Ambulatory Visit (INDEPENDENT_AMBULATORY_CARE_PROVIDER_SITE_OTHER): Payer: BLUE CROSS/BLUE SHIELD | Admitting: Sports Medicine

## 2020-03-24 VITALS — BP 129/75 | Ht 66.0 in | Wt 128.0 lb

## 2020-03-24 DIAGNOSIS — M25561 Pain in right knee: Secondary | ICD-10-CM

## 2020-03-24 DIAGNOSIS — M25461 Effusion, right knee: Secondary | ICD-10-CM | POA: Diagnosis not present

## 2020-03-24 DIAGNOSIS — M1711 Unilateral primary osteoarthritis, right knee: Secondary | ICD-10-CM | POA: Diagnosis not present

## 2020-03-24 MED ORDER — GABAPENTIN 300 MG PO CAPS
ORAL_CAPSULE | ORAL | 1 refills | Status: DC
Start: 2020-03-24 — End: 2020-07-05

## 2020-03-24 NOTE — Patient Instructions (Addendum)
Thank you for coming in to see [place] today! Please see below to review our plan for today's visit:   1. Increase your gabapentin to 300 mg in the morning, 300 mg in the middle of the day, and 600 mg at night 2.   Please follow-up at [facility] imaging for x-rays of your knee. 3.   Please schedule follow-up with me for evaluation of her left shoulder at her earliest convenience, and follow-up in 1 to 2 months for reevaluation of your knee. 4.  Continue PT Pilates and strengthening exercises.   Please call the clinic at [phone] if your symptoms worsen or you have any concerns. It was our pleasure to serve you.       Dr. Guy Sandifer [name] [facility]

## 2020-03-24 NOTE — Progress Notes (Signed)
PCP: [name], MD  Subjective:   HPI: Patient is a 59 y.o. female with history of EDS and several surgeries including bilateral tibial osteotomies, multiple spine fusions, and left knee replacement who is here for re-evaluation of right knee pain.  She has been seen for this multiple times in this clinic, initially on 07/2019 at which time she was having some buckling and posterior pain.  Exam was relatively normal at that time and ultrasound showed findings suspicious for degenerative meniscus tear.  She started home exercise program focusing on quad and hamstring strengthening.  She follow-up in 09/2019 without much improvement in her symptoms and was continued to have popping and clicking in her knee.  It was felt that she may have some patellar maltracking or instability related to EDS.  Is recommend that she do hip strengthening exercises and calf/hamstring stretching.  Since then, patient reports that she has not had much improvement in her symptoms.  Her pain currently is mostly posterior knee.  She also does have some lateral hip and foot pain.  Is worsened with going up and down stairs, can awaken her at night, and is worse first thing in the morning with her first few steps.  It is also worse after standing from a seated position if she has been seated for a long time.  She has not been very religious about her previously prescribed exercise routines, but has been doing PT Pilates and walking.   Review of Systems:  Per HPI.   PMFSH, medications and smoking status reviewed.      Objective:  Physical Exam:  [facility] 03/24/2020  Frequency of aerobic exercise (# of days/week) 4  Average time in minutes 30  Frequency of strengthening activities (# of days/week) 2    BP 129/75   Ht 5\' 6"  (1.676 m)   Wt 128 lb (58.1 kg)   BMI 20.66 kg/m    Gen: awake, alert, NAD, comfortable in exam room Pulm: breathing unlabored  R Knee  Inspection: Bilateral  knees without evidence of erythema, ecchymosis, swelling, edema. No effusion present.  Healed surgical scar from osteotomy. Active ROM: Intact. 0-150d.  Strength: 5/5 strength to resisted flexion/extension without pain  Patella: Negative patellar grind. No patellar facet tenderness. No apprehension. No proximal or distal patellar tendon tenderness to palpation. No quad tendon tenderness to palpation.   Tibia: No tibial plateau, tibial tuberosity tenderness.  Joint line: No joint line tenderness.  Popliteal: No popliteal tenderness to the insertional gastroc.  Very mild medial hamstring tenderness. McMurrays/Thessaly's: Negative bilaterally for pain, catching  Lachmans: Stable bilaterally with firm endpoint  Anterior/Posterior drawer: Stable bilaterally Varus/valgus stress at 0, 15d: Negative for pain, laxity   Negative SLR bilaterally     Assessment & Plan:  1. R Posterior Knee Pain Referred pain from lumbar radiculopathy vs. OA vs. Hamstrings tendinopathy vs. Degenerative meniscus.  She does note several signs consistent with referred pain into her history of lumbar fusion would be consistent with this.  We will plan to obtain x-ray of her knee to evaluate for OA, increase her gabapentin to see if this will be helpful for her radiculopathy, continue current exercises (PT Pilates, walking).  We will plan to follow-up in 1 to 2 months for reevaluation, if gabapentin is very helpful, would consider referral for lumbar steroid injections.  AT end of visit, patient noted that she has been having left shoulder pain and decreased range of motion, advised that she schedule a  follow-up visit for evaluation of this and ultrasound.   Guy Sandifer, MD [facility] Fellow 03/24/2020 3:42 PM   I observed and examined the patient with the [facility] resident and agree with assessment and plan.  Note reviewed and modified by me.  XR reviewed by me and she has loss of joint space medial and lateral  compartment without spurring.  Formal report pending. Sterling Big, MD

## 2020-03-29 ENCOUNTER — Telehealth: Payer: Self-pay | Admitting: Family Medicine

## 2020-03-29 DIAGNOSIS — R1032 Left lower quadrant pain: Secondary | ICD-10-CM | POA: Diagnosis not present

## 2020-03-29 DIAGNOSIS — Z800 Family history of malignant neoplasm of digestive organs: Secondary | ICD-10-CM | POA: Diagnosis not present

## 2020-03-29 DIAGNOSIS — Z1159 Encounter for screening for other viral diseases: Secondary | ICD-10-CM | POA: Diagnosis not present

## 2020-03-29 DIAGNOSIS — K621 Rectal polyp: Secondary | ICD-10-CM | POA: Diagnosis not present

## 2020-03-29 NOTE — Telephone Encounter (Signed)
Called patient and notified her of the XR results. Continue current plan.  -Guy Sandifer

## 2020-03-30 DIAGNOSIS — H5213 Myopia, bilateral: Secondary | ICD-10-CM | POA: Diagnosis not present

## 2020-03-30 DIAGNOSIS — H2513 Age-related nuclear cataract, bilateral: Secondary | ICD-10-CM | POA: Diagnosis not present

## 2020-04-05 ENCOUNTER — Telehealth: Payer: Self-pay | Admitting: Cardiovascular Disease

## 2020-04-05 NOTE — Telephone Encounter (Signed)
Patient is returning call regarding lab results from 03/07/20.

## 2020-04-05 NOTE — Telephone Encounter (Signed)
Informed pt of results. Pt verbalized understanding. 

## 2020-04-06 ENCOUNTER — Other Ambulatory Visit: Payer: Self-pay

## 2020-04-06 ENCOUNTER — Encounter: Payer: Self-pay | Admitting: Family Medicine

## 2020-04-06 ENCOUNTER — Ambulatory Visit (INDEPENDENT_AMBULATORY_CARE_PROVIDER_SITE_OTHER): Payer: BLUE CROSS/BLUE SHIELD | Admitting: Family Medicine

## 2020-04-06 VITALS — BP 110/68 | Ht 66.0 in | Wt 128.0 lb

## 2020-04-06 DIAGNOSIS — M5416 Radiculopathy, lumbar region: Secondary | ICD-10-CM

## 2020-04-06 DIAGNOSIS — S46002A Unspecified injury of muscle(s) and tendon(s) of the rotator cuff of left shoulder, initial encounter: Secondary | ICD-10-CM | POA: Diagnosis not present

## 2020-04-06 NOTE — Progress Notes (Addendum)
PCP: [name], MD  Subjective:   HPI: Patient is a 59 y.o. female with history of EDS, right rotator cuff tendinopathy, and several surgeries including bilateral tibial osteotomies, multiple spine fusions, and left knee replacement here for evaluation of left shoulder pain.  She reports that she is not aware of any specific trauma, but has had progressive worsening of left lateral shoulder pain.  The pain is over the lateral aspect of her shoulder and is fairly nonspecific in location.  It is aggravated by reaching overhead and external rotation.  Relieved by avoiding overhead activities and somewhat by NSAIDs.  She denies neck pain.  She denies any radicular symptoms down her arm.  No numbness or tingling.  No weakness.  Patient also notes that despite increasing her gabapentin as recommended last visit, she continues to have radicular symptoms down the back of her right leg and extending down to her heel.  She also continues to have some mild knee pain however the radicular symptoms seem to be the most significant pain in her lower extremity.  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  She would like to be referred to spine for evaluation.  Review of Systems:  Per HPI.   PMFSH, medications and smoking status reviewed.      Objective:  Physical Exam:  [facility] 03/24/2020 04/06/2020  Frequency of aerobic exercise (# of days/week) 4 4  Average time in minutes 30 30  Frequency of strengthening activities (# of days/week) 2 2     Gen: awake, alert, NAD, comfortable in exam room Pulm: breathing unlabored  L Shoulder: -Inspection: no obvious deformity, atrophy, or asymmetry. No bruising. No swelling -Palpation: no TTP over [facility] joint or bicipital groove. -ROM: Full ROM in abduction, flexion, internal/external rotation both passively and actively.  She does have a painful arc. NV intact  distally Normal scapular function observed. Special Tests:  - Impingement:  Positive Hawkins, Neg neers, positive empty can sign. - Supraspinatus: Positive empty can.  5/5 strength with resisted flexion at 20 degrees but with pain. - Infraspinatus/Teres Minor: 5/5 strength with ER - Subscapularis: 5/5 strength with IR - Biceps tendon: Neg Speeds  - Labrum: Negative Obriens, good stability -No drop arm sign  [place] left shoulder: -Biceps tendon: Well visualized within the bicipital groove and with a small peritendinous cyst seen proximally. -Pectoralis: Insertion visualized and without abnormalities. -Subscapularis: Well visualized to insertion point on humerus.  There is an area of cortical irregularity and osteophyte noted at the insertion onto the humerus.  Dynamic testing over the coracoid did not show signs of impingement. -AC joint: No osteophytes, no significant separation, negative geyser sign. -Supraspinatus: There is an area of cortical irregularity on the articular surface of the humerus with associated calcifications of the tendon and a small hypoechoic area in this region. -Subacromial bursa: No obvious enlargement or swelling. -Infraspinatus/teres minor: Insertion point on posterior humerus visualized and without abnormalities.  Impression: -Small partial tear of the supraspinatus tendon with associated chronic tendinopathy changes -Calcifications within the subscapularis consistent with chronic tendinopathy      Assessment & Plan:  1.  Supraspinatus tendinopathy with partial tear Exam findings and ultrasound consistent with supraspinatus tendinopathy.  We will plan to start with nitroglycerin patches and home physical therapy.  Exercises reviewed today with patient.  She has used nitroglycerin patches before and I reviewed the protocol with her today.  Follow-up  in 1 to 2 months for reevaluation.  2.  Lumbar radiculopathy Patient with continued lumbar radicular symptoms  despite increasing gabapentin.  She would like to see neurosurgery, who she has seen previously, for evaluation for lumbar epidural steroid injection which I think is reasonable.  Referral placed today.  Guy Sandifer, MD [facility] 04/06/2020 1:37 PM   I was the preceptor for this visit and available for immediate consultation Marsa Aris, DO

## 2020-04-07 DIAGNOSIS — E785 Hyperlipidemia, unspecified: Secondary | ICD-10-CM | POA: Diagnosis not present

## 2020-04-07 DIAGNOSIS — M859 Disorder of bone density and structure, unspecified: Secondary | ICD-10-CM | POA: Diagnosis not present

## 2020-04-07 DIAGNOSIS — Z0000 Encounter for general adult medical examination without abnormal findings: Secondary | ICD-10-CM | POA: Diagnosis not present

## 2020-04-09 DIAGNOSIS — Z23 Encounter for immunization: Secondary | ICD-10-CM | POA: Diagnosis not present

## 2020-04-13 ENCOUNTER — Other Ambulatory Visit: Payer: Self-pay

## 2020-04-13 DIAGNOSIS — J309 Allergic rhinitis, unspecified: Secondary | ICD-10-CM | POA: Diagnosis not present

## 2020-04-13 MED ORDER — NITROGLYCERIN 0.2 MG/HR TD PT24: MEDICATED_PATCH | TRANSDERMAL | 1 refills | Status: DC

## 2020-04-14 DIAGNOSIS — D649 Anemia, unspecified: Secondary | ICD-10-CM | POA: Diagnosis not present

## 2020-04-14 DIAGNOSIS — Z1331 Encounter for screening for depression: Secondary | ICD-10-CM | POA: Diagnosis not present

## 2020-04-14 DIAGNOSIS — R82998 Other abnormal findings in urine: Secondary | ICD-10-CM | POA: Diagnosis not present

## 2020-04-14 DIAGNOSIS — Z0000 Encounter for general adult medical examination without abnormal findings: Secondary | ICD-10-CM | POA: Diagnosis not present

## 2020-04-27 DIAGNOSIS — H02831 Dermatochalasis of right upper eyelid: Secondary | ICD-10-CM | POA: Diagnosis not present

## 2020-04-27 DIAGNOSIS — H02834 Dermatochalasis of left upper eyelid: Secondary | ICD-10-CM | POA: Diagnosis not present

## 2020-04-27 DIAGNOSIS — H57813 Brow ptosis, bilateral: Secondary | ICD-10-CM | POA: Diagnosis not present

## 2020-04-27 DIAGNOSIS — H02825 Cysts of left lower eyelid: Secondary | ICD-10-CM | POA: Diagnosis not present

## 2020-04-28 DIAGNOSIS — Z1212 Encounter for screening for malignant neoplasm of rectum: Secondary | ICD-10-CM | POA: Diagnosis not present

## 2020-05-04 ENCOUNTER — Other Ambulatory Visit: Payer: Self-pay | Admitting: Physician Assistant

## 2020-05-04 ENCOUNTER — Ambulatory Visit: Payer: BLUE CROSS/BLUE SHIELD | Admitting: Family Medicine

## 2020-05-04 NOTE — Telephone Encounter (Signed)
Please schedule patient a follow up visit. Patient due January 2022. Thanks!

## 2020-05-04 NOTE — Telephone Encounter (Signed)
Last Visit: 01/26/2020 Next Visit: due January 2022. Message sent to the front to schedule patient.  Last Fill: 02/08/2020  Okay to refill Ambien?

## 2020-05-11 ENCOUNTER — Other Ambulatory Visit: Payer: Self-pay | Admitting: Rheumatology

## 2020-05-11 ENCOUNTER — Telehealth: Payer: Self-pay

## 2020-05-11 MED ORDER — METHOCARBAMOL 750 MG PO TABS
750.0000 mg | ORAL_TABLET | Freq: Every day | ORAL | 0 refills | Status: DC
Start: 2020-05-11 — End: 2020-07-26

## 2020-05-11 NOTE — Telephone Encounter (Signed)
Patient called requesting prescription refills of Methocarbamol and Meloxicam to be sent to [facility] at [address].

## 2020-05-11 NOTE — Telephone Encounter (Signed)
Last Visit: 01/26/2020 Next Visit: 08/04/2020 Labs: 03/07/2020 CMP Albumin/Globulin 2.4 Hepatic Function WNL 07/28/2019  Okay to refill Methocarbamol and Mobic?

## 2020-05-11 NOTE — Telephone Encounter (Signed)
See rx note for details.  °

## 2020-05-11 NOTE — Telephone Encounter (Signed)
Patient called requesting prescription refill of Zolpidem to [facility] to [facility] at [address].  Patient states she only has 1 pill remaining and will be leaving to go out of town on Friday, 05/13/20.  Patient states her last refill was in August.  Patient states a refill was sent to [facility] in October, but they put it back since it was too soon to fill.

## 2020-05-11 NOTE — Telephone Encounter (Signed)
Patient advised spoke with pharmacy and prescription for Ambien has been filled and is ready for pick up.

## 2020-05-17 ENCOUNTER — Other Ambulatory Visit: Payer: Self-pay | Admitting: *Deleted

## 2020-05-17 DIAGNOSIS — M47816 Spondylosis without myelopathy or radiculopathy, lumbar region: Secondary | ICD-10-CM

## 2020-05-24 ENCOUNTER — Other Ambulatory Visit: Payer: Self-pay | Admitting: Sports Medicine

## 2020-05-24 DIAGNOSIS — M5136 Other intervertebral disc degeneration, lumbar region: Secondary | ICD-10-CM

## 2020-05-30 ENCOUNTER — Telehealth: Payer: Self-pay

## 2020-05-30 NOTE — Telephone Encounter (Signed)
Patient returned the call and states it was the meloxicam prescription. I advised patient of my conversation with the pharmacy staff and she verbalized understanding.

## 2020-05-30 NOTE — Telephone Encounter (Signed)
I called [facility] and was advised patient does not have any issues with getting prescriptions filled due to insurance. I was advised that patient picked up the meloxicam prescription on 05/20/2020 and paid $9. I attempted to contact patient and left this information on the patient's voicemail. Also asked patient to call to clarify exactly which medication she is referring to.

## 2020-05-30 NOTE — Telephone Encounter (Signed)
Patient left voicemail stating [facility] and was told [employer] won't cover it because it is written for 2 pills per day and they will only pay for 1 pill per day.

## 2020-05-31 ENCOUNTER — Ambulatory Visit: Payer: BLUE CROSS/BLUE SHIELD | Admitting: Sports Medicine

## 2020-06-07 ENCOUNTER — Ambulatory Visit: Payer: BLUE CROSS/BLUE SHIELD | Admitting: Sports Medicine

## 2020-06-12 ENCOUNTER — Other Ambulatory Visit: Payer: Self-pay | Admitting: Rheumatology

## 2020-06-13 NOTE — Telephone Encounter (Signed)
Last Visit: 01/26/2020 Next Visit: 08/04/2020 Labs: 03/07/2020 CMP Albumin/Globulin 2.4 Hepatic Function WNL 07/28/2019  Okay to refill Mobic?

## 2020-06-14 ENCOUNTER — Ambulatory Visit: Payer: BLUE CROSS/BLUE SHIELD | Admitting: Sports Medicine

## 2020-06-14 DIAGNOSIS — Z1152 Encounter for screening for COVID-19: Secondary | ICD-10-CM | POA: Diagnosis not present

## 2020-06-14 DIAGNOSIS — R509 Fever, unspecified: Secondary | ICD-10-CM | POA: Diagnosis not present

## 2020-06-14 DIAGNOSIS — Z20828 Contact with and (suspected) exposure to other viral communicable diseases: Secondary | ICD-10-CM | POA: Diagnosis not present

## 2020-06-14 DIAGNOSIS — J029 Acute pharyngitis, unspecified: Secondary | ICD-10-CM | POA: Diagnosis not present

## 2020-06-21 ENCOUNTER — Other Ambulatory Visit: Payer: BLUE CROSS/BLUE SHIELD

## 2020-06-21 ENCOUNTER — Other Ambulatory Visit: Payer: Self-pay

## 2020-06-21 ENCOUNTER — Ambulatory Visit (INDEPENDENT_AMBULATORY_CARE_PROVIDER_SITE_OTHER): Payer: BLUE CROSS/BLUE SHIELD | Admitting: Sports Medicine

## 2020-06-21 DIAGNOSIS — M25512 Pain in left shoulder: Secondary | ICD-10-CM | POA: Diagnosis not present

## 2020-06-21 DIAGNOSIS — M47816 Spondylosis without myelopathy or radiculopathy, lumbar region: Secondary | ICD-10-CM

## 2020-06-21 NOTE — Assessment & Plan Note (Signed)
She has done very well with NTG protocol HEP to emphasize easy RC strength work  Keep up with HEP Reck if problems

## 2020-06-21 NOTE — Assessment & Plan Note (Signed)
I think the pilates helps this She does well on higher doses of gabapentin but has trouble with drowsiness on daytime does Continue 600 HS If she gets a flare let's try higher dose in day for a few days to see if we can avoid CSI

## 2020-06-21 NOTE — Progress Notes (Signed)
F/U of left rotator cuff tendinopathy from 9/29  Within 2 weeks of starting NTG pain was much less Within 4 weeks she felt like left shoulder was normal No pain with pilates or ADLs Sleeping with no pain Stopped NTG 2 weeks ago  Lumbar radiculopathy to RT leg This was severe in October and we scheduled her for spine CSI She had to go to [place] to help her dad Was able to up her gabapentin to 300/300/600 hs Also slept better Radicular sxs have resolved and so cancelled the injection Note she has extensive fusions in spine  ROS Overall feels good Less neck, knee and elbow pain Able to do pilates classes without pain  PE Pleasant F in NAD BP 120/60   Ht 5\' 5"  (1.651 m)   Wt 127 lb (57.6 kg)   BMI 21.13 kg/m  [facility] 03/24/2020 04/06/2020 06/21/2020  Frequency of aerobic exercise (# of days/week) 4 4 4   Average time in minutes 30 30 40  Frequency of strengthening activities (# of days/week) 2 2 2    Left shoulder Shoulder: Inspection reveals no abnormalities, atrophy or asymmetry. Palpation is normal with no tenderness over AC joint or bicipital groove. ROM is full in all planes. Rotator cuff strength normal throughout. No signs of impingement with negative Neer and Hawkin's tests, empty can. Speeds and Yergason's tests normal. No labral pathology noted with negative Obrien's, negative clunk and good stability. Normal scapular function observed. No painful arc and no drop arm sign. No apprehension sign    Note big improvement since last visit  RT hip full ROM RT SLR with no pain in back or down leg Normal neuro testing

## 2020-07-05 ENCOUNTER — Other Ambulatory Visit: Payer: Self-pay | Admitting: *Deleted

## 2020-07-05 MED ORDER — GABAPENTIN 300 MG PO CAPS: ORAL_CAPSULE | ORAL | 1 refills | Status: DC

## 2020-07-07 DIAGNOSIS — R059 Cough, unspecified: Secondary | ICD-10-CM | POA: Diagnosis not present

## 2020-07-07 DIAGNOSIS — J069 Acute upper respiratory infection, unspecified: Secondary | ICD-10-CM | POA: Diagnosis not present

## 2020-07-09 DIAGNOSIS — G540 Brachial plexus disorders: Secondary | ICD-10-CM

## 2020-07-09 HISTORY — DX: Brachial plexus disorders: G54.0

## 2020-07-10 ENCOUNTER — Other Ambulatory Visit: Payer: Self-pay | Admitting: Physician Assistant

## 2020-07-11 NOTE — Telephone Encounter (Signed)
Last Visit: 01/26/2020 Next Visit:08/04/2020 Labs: 03/07/2020 CMP Albumin/Globulin 2.4 Hepatic Function WNL 07/28/2019  Okay to refill Mobic?

## 2020-07-21 ENCOUNTER — Ambulatory Visit (INDEPENDENT_AMBULATORY_CARE_PROVIDER_SITE_OTHER): Payer: BC Managed Care – PPO | Admitting: Family Medicine

## 2020-07-21 ENCOUNTER — Other Ambulatory Visit: Payer: Self-pay

## 2020-07-21 VITALS — BP 104/68 | Ht 65.5 in | Wt 128.0 lb

## 2020-07-21 DIAGNOSIS — G540 Brachial plexus disorders: Secondary | ICD-10-CM

## 2020-07-21 DIAGNOSIS — Q760 Spina bifida occulta: Secondary | ICD-10-CM | POA: Diagnosis not present

## 2020-07-21 NOTE — Patient Instructions (Addendum)
We would like you to see Dr. Berline Chough for your right shoulder - right thoracic outlet syndrome. They will call you to schedule an appt.  [facility] [address]  [place], [place] [address] [phone]

## 2020-07-21 NOTE — Progress Notes (Signed)
Office Visit Note   Patient: Nicole Oneill           Date of Birth: Jan 02, 1961           MRN: [id number] Visit Date: 07/21/2020 Requested by: [name], MD [address] [place],  [place] [address] PCP: [name], MD  Subjective: CC: Right arm numbness  HPI: 60 year old female with past medical history of EDS presenting to clinic today with concerns of right arm numbness upon waking morning.  Patient states that she sleeps with her arms folded above her head and for the past few months she has woken up with her right arm "completely dead" in the morning. She usually has to reach up with her left arm to bring her right arm down and her feeling really return within a few minutes.  This morning, however, feeling took nearly an hour to return to her right hand and she became quite concerned.  She says it has never lasted this long before and she is worried that she might have some damage to the nerve in her shoulder.  She has a history of extensive back surgery stating that her "entire back is fused."  She says that in addition to this arm numbness she has had a feeling of painful muscle knots within the levator scapula as well as the superior trapezius, which has been ongoing for several weeks.  No new trauma that she can recall.  She says that now her arm feels "back to normal."  Denies any persistent weakness in the arm, no current numbness.              ROS:   All other systems were reviewed and are negative.  Objective: Vital Signs: BP 104/68   Ht 5' 5.5" (1.664 m)   Wt 128 lb (58.1 kg)   BMI 20.98 kg/m   Physical Exam:  General:  Alert and oriented, in no acute distress. Pulm:  Breathing unlabored. Psy:  Normal mood, congruent affect. Skin: Right arm with no bruising, rashes, or erythema.  Overlying skin intact. Musculoskeletal:  Tenderness to palpation within the right superior trapezius and levator scapula musculature.  Tenderness along the right cervical paraspinal pillars,  as well as upper thoracic paraspinal muscles. Osteopathic examination with ribs 1 elevated on the right, which is very tender to palpation. Roos test positive on the right. Exaggerated military test positive on the right.  Strength testing:  5 out of 5 strength with shoulder abduction (C5), wrist extension (C6), wrist flexion (C7), grip strength (C8), and finger abduction (T1).  Sensation: Intact to light touch throughout bilateral upper extremities.   Brisk distal capillary refill.   Imaging: No results found.  Assessment & Plan: 60 year old female presenting to clinic with acute right arm numbness that started this morning after sleeping with her arm abducted above her head.Given positive Elenore Paddy test on examination today, high suspicion for thoracic outlet syndrome as a source of her symptoms.  We discussed that this typically responds very well to manipulative therapy.  Given her history of EDS, would recommend she see osteopathic physician for this treatment.  Referral placed to see Dr. Berline Chough, a sports osteopath in the community. -Patient is agreeable with assessment and plan.  Dr. Janeece Riggers office agrees to contact her to help schedule an intake appointment. -If no improvement with osteopathic adjustment, patient is to return to clinic for reevaluation.    I was the preceptor for this visit and available for immediate consultation Marcial Pacas  Frazier Butt, DO

## 2020-07-21 NOTE — Progress Notes (Signed)
Office Visit Note  Patient: Nicole Oneill             Date of Birth: June 29, 1961           MRN: [id number]             PCP: [name], MD Referring: [name], MD Visit Date: 08/04/2020 Occupation: @GUAROCC @  Subjective:  Neck and lower back pain.   History of Present Illness: Nicole Oneill is a 60 y.o. female with history of osteoarthritis, and degenerative disc disease. She continues to have a lot of discomfort. She states she was having increased neck pain and right-sided radiculopathy. She was diagnosed with thoracic outlet syndrome by Dr. 77. She has been going to Dr. Darrick Penna and for dry needling which has been helpful. She states the symptoms recur soon after the treatment. She has been taking meloxicam 15 mg p.o. daily and muscle relaxer at nighttime to get some relief. She is in constant discomfort. She has appointment coming up with Dr. Berline Chough and she will get DEXA scan.  Activities of Daily Living:  Patient reports morning stiffness for 1 hour.   Patient Reports nocturnal pain.  Difficulty dressing/grooming: Denies Difficulty climbing stairs: Denies Difficulty getting out of chair: Denies Difficulty using hands for taps, buttons, cutlery, and/or writing: Reports  Review of Systems  Constitutional: Negative for fatigue.  HENT: Positive for mouth dryness. Negative for mouth sores and nose dryness.   Eyes: Positive for dryness. Negative for pain and itching.  Respiratory: Negative for shortness of breath and difficulty breathing.   Cardiovascular: Negative for chest pain and palpitations.  Gastrointestinal: Negative for blood in stool, constipation and diarrhea.  Endocrine: Negative for increased urination.  Genitourinary: Negative for difficulty urinating.  Musculoskeletal: Positive for arthralgias, joint pain, myalgias, morning stiffness, muscle tenderness and myalgias. Negative for joint swelling.  Skin: Negative for color change, rash and redness.   Allergic/Immunologic: Negative for susceptible to infections.  Neurological: Positive for numbness. Negative for dizziness, headaches, memory loss and weakness.  Hematological: Positive for bruising/bleeding tendency.  Psychiatric/Behavioral: Negative for confusion.    PMFS History:  Patient Active Problem List   Diagnosis Date Noted  . Chest pain 05/18/2019  . Right shoulder pain 02/19/2019  . History of sleep apnea 04/28/2018  . DDD (degenerative disc disease), cervical 04/28/2018  . DDD (degenerative disc disease), lumbar 10/29/2017  . Sicca syndrome, unspecified 10/29/2017  . Left anterior shoulder pain 11/27/2016  . History of lumbar fusion 10/16/2016  . History of fusion of cervical spine 10/16/2016  . History of total knee arthroplasty, left 2012 10/16/2016  . Other fatigue 10/16/2016  . Primary insomnia 10/16/2016  . DDD lumbar 10/15/2016  . DJD (degenerative joint disease), cervical 10/15/2016  . DDD (degenerative disc disease), thoracic 10/15/2016  . Primary osteoarthritis of both knees 10/15/2016  . Tendinopathy of right gluteus medius 05/29/2016  . Chronic lumbar radiculopathy 03/08/2016  . Heart murmur, systolic 06/19/2014  . Disorder of SI (sacroiliac) joint 06/02/2014  . Functional gait abnormality 06/02/2014  . Ehlers-Danlos syndrome type III 03/10/2014  . Foot arch pain 03/10/2014  . Labral tear of shoulder 05/26/2013  . Left knee pain 05/26/2013  . Primary osteoarthritis of both hands 05/26/2013  . Effusion of knee joint, left 08/05/2012  . Plantar fasciitis 02/05/2011  . Lateral epicondylitis of right elbow 10/12/2010  . Osteoarthritis of multiple joints 10/12/2010    Past Medical History:  Diagnosis Date  . Anemia   . Arthritis  osteo  . Atrial flutter (HCC)    x 10 years ago wias diagnosed with A. Flutter while wearing a heart monitor.  No problems since.   . Complication of anesthesia    urinary retention requiring indwelling  foley  catheterization  . DDD (degenerative disc disease)   . Ehlers-Danlos syndrome type III   . Fibromyalgia   . History of blood transfusion   . Osteoarthritis   . Thoracic outlet syndrome 07/2020   per patient     Family History  Problem Relation Age of Onset  . Stroke Mother        hemorrhagic  . Hypertension Father   . Cancer - Prostate Father   . Atrial fibrillation Father   . Congestive Heart Failure Father   . Anorexia nervosa Sister   . Heart attack Maternal Grandmother   . Stroke Maternal Grandfather   . Cancer - Colon Paternal Grandmother   . Cancer - Colon Paternal Grandfather   . Healthy Daughter   . Healthy Daughter   . Healthy Daughter    Past Surgical History:  Procedure Laterality Date  . BACK SURGERY     Cervical fusion C5-7, fusion  T10-S1  . DIAGNOSTIC LAPAROSCOPY     x 3  . EYE SURGERY     bilateral tear duct fusion  . JOINT REPLACEMENT     partial left knee 2005, total left knee 2012  . KNEE ARTHROPLASTY    . KNEE ARTHROSCOPY  08/06/2012   Procedure: ARTHROSCOPY KNEE;  Surgeon: Loanne Drilling, MD;  Location: [facility];  Service: Orthopedics;  Laterality: Left;  WITH SYNOVECTOMY and debridement  . OSTEOTOMY     bilateral tibial   Social History   Social History Narrative   Patient lives at home with family.   Caffeine Use: 4-5 sodas weekly   Immunization History  Administered Date(s) Administered  . Influenza Inj Mdck Quad Pf 04/22/2017  . Influenza,inj,quad, With Preservative 04/08/2017  . Moderna Sars-Covid-2 Vaccination 10/11/2019, 11/06/2019  . PFIZER(Purple Top)SARS-COV-2 Vaccination 06/09/2020     Objective: Vital Signs: BP 128/74 (BP Location: Left Arm, Patient Position: Sitting, Cuff Size: Normal)   Pulse 72   Resp 13   Ht 5' 5.5" (1.664 m)   Wt 130 lb 6.4 oz (59.1 kg)   BMI 21.37 kg/m    Physical Exam Vitals and nursing note reviewed.  Constitutional:      Appearance: She is well-developed and well-nourished.  HENT:     Head:  Normocephalic and atraumatic.  Eyes:     Extraocular Movements: EOM normal.     Conjunctiva/sclera: Conjunctivae normal.  Cardiovascular:     Rate and Rhythm: Normal rate and regular rhythm.     Pulses: Intact distal pulses.     Heart sounds: Normal heart sounds.  Pulmonary:     Effort: Pulmonary effort is normal.     Breath sounds: Normal breath sounds.  Abdominal:     General: Bowel sounds are normal.     Palpations: Abdomen is soft.  Musculoskeletal:     Cervical back: Normal range of motion.  Lymphadenopathy:     Cervical: No cervical adenopathy.  Skin:    General: Skin is warm and dry.     Capillary Refill: Capillary refill takes less than 2 seconds.  Neurological:     Mental Status: She is alert and oriented to person, place, and time.  Psychiatric:        Mood and Affect: Mood and affect normal.  Behavior: Behavior normal.      Musculoskeletal Exam: She had good range of motion of her C-spine. She has limited range of motion of thoracic and lumbar spine with discomfort. Shoulder joints, elbow joints, wrist joints with good range of motion. She has severe CMC arthritis with subluxation. She has PIP and DIP thickening bilaterally. She had good range of motion of hip joints and knee joints. Left knee joint is replaced. She had no tenderness over ankles or MTPs.  CDAI Exam: CDAI Score: -- Patient Global: --; Provider Global: -- Swollen: --; Tender: -- Joint Exam 08/04/2020   No joint exam has been documented for this visit   There is currently no information documented on the homunculus. Go to the Rheumatology activity and complete the homunculus joint exam.  Investigation: No additional findings.  Imaging: No results found.  Recent Labs: Lab Results  Component Value Date   WBC 8.5 05/18/2019   HGB 14.4 05/18/2019   PLT 222 05/18/2019   NA 142 03/07/2020   K 3.9 03/07/2020   CL 102 03/07/2020   CO2 28 03/07/2020   GLUCOSE 81 03/07/2020   BUN 20  03/07/2020   CREATININE 0.89 03/07/2020   BILITOT 0.4 03/07/2020   ALKPHOS 65 03/07/2020   AST 25 03/07/2020   ALT 20 03/07/2020   PROT 6.4 03/07/2020   ALBUMIN 4.5 03/07/2020   CALCIUM 9.4 03/07/2020   GFRAA 81 03/07/2020    Speciality Comments: No specialty comments available.  Procedures:  No procedures performed Allergies: Patient has no known allergies.   Assessment / Plan:     Visit Diagnoses: Primary osteoarthritis of both hands -she has severe arthritis in her hands with bilateral CMC subluxation. She has DIP thickening. I have given her a prescription for bilateral CMC braces. She has been taking meloxicam 15mg  by mouth daily as needed, Robaxin 750 mg 1 tablet by mouth at bedtime as needed for muscle spasms. Joint protection was discussed.  History of arthroplasty of left knee-left total knee replacement is doing well.  Trochanteric bursitis of both hips -she has intermittent trochanteric bursitis. Stretching exercises were discussed.  Sicca syndrome (HCC)-she has been using over-the-counter products.  DDD (degenerative disc disease), cervical-she continues to have some neck discomfort. She has been having right-sided radiculopathy. She was seen by Dr. . Now she is going to Dr. Darrick Penna and for dry needling.  DDD (degenerative disc disease), lumbar - Status post fusion. She has limited range of motion with discomfort.  DDD (degenerative disc disease), thoracic - Status post fusion.  Medication monitoring encounter -she is taking long-term NSAIDs. Side effects were reviewed. Plan: CBC with Differential/Platelet, COMPLETE METABOLIC PANEL WITH GFR  Ehlers-Danlos syndrome type III-diagnosed by geneticist.  Primary insomnia -improved on Ambien 5 mg 1 tablet by mouth at bedtime as needed for insomnia. She is unable to sleep without Ambien.  Other fatigue-related to generalized pain and insomnia.  History of sleep apnea-she is followed [facility]  She is fully  vaccinated against COVID-19 and also received a booster. Use of mask, social distancing and hand hygiene was discussed.  Orders: Orders Placed This Encounter  Procedures  . CBC with Differential/Platelet  . COMPLETE METABOLIC PANEL WITH GFR   No orders of the defined types were placed in this encounter.    Follow-Up Instructions: Return in about 6 months (around 02/01/2021) for Osteoarthritis.   02/03/2021, MD  Note - This record has been created using Pollyann Savoy.  Chart creation errors have been sought, but  may not always  have been located. Such creation errors do not reflect on  the standard of medical care.

## 2020-07-26 ENCOUNTER — Other Ambulatory Visit: Payer: Self-pay | Admitting: Rheumatology

## 2020-07-26 ENCOUNTER — Other Ambulatory Visit: Payer: Self-pay | Admitting: Cardiovascular Disease

## 2020-07-26 NOTE — Telephone Encounter (Signed)
Last Visit: 01/26/2020 Next Visit:08/04/2020  Last Fill Robaxin: 05/11/2020 Last Fill Ambien: 05/04/2020  Okay to refill Methocarbamol and Ambien?

## 2020-07-27 DIAGNOSIS — M9901 Segmental and somatic dysfunction of cervical region: Secondary | ICD-10-CM | POA: Diagnosis not present

## 2020-07-27 DIAGNOSIS — M79601 Pain in right arm: Secondary | ICD-10-CM | POA: Diagnosis not present

## 2020-07-27 DIAGNOSIS — M9907 Segmental and somatic dysfunction of upper extremity: Secondary | ICD-10-CM | POA: Diagnosis not present

## 2020-07-27 DIAGNOSIS — G540 Brachial plexus disorders: Secondary | ICD-10-CM | POA: Diagnosis not present

## 2020-08-04 ENCOUNTER — Other Ambulatory Visit: Payer: Self-pay

## 2020-08-04 ENCOUNTER — Ambulatory Visit (INDEPENDENT_AMBULATORY_CARE_PROVIDER_SITE_OTHER): Payer: BC Managed Care – PPO | Admitting: Rheumatology

## 2020-08-04 ENCOUNTER — Encounter: Payer: Self-pay | Admitting: Rheumatology

## 2020-08-04 VITALS — BP 128/74 | HR 72 | Resp 13 | Ht 65.5 in | Wt 130.4 lb

## 2020-08-04 DIAGNOSIS — M5136 Other intervertebral disc degeneration, lumbar region: Secondary | ICD-10-CM

## 2020-08-04 DIAGNOSIS — M5134 Other intervertebral disc degeneration, thoracic region: Secondary | ICD-10-CM

## 2020-08-04 DIAGNOSIS — Z96652 Presence of left artificial knee joint: Secondary | ICD-10-CM

## 2020-08-04 DIAGNOSIS — M19041 Primary osteoarthritis, right hand: Secondary | ICD-10-CM | POA: Diagnosis not present

## 2020-08-04 DIAGNOSIS — M7061 Trochanteric bursitis, right hip: Secondary | ICD-10-CM

## 2020-08-04 DIAGNOSIS — F5101 Primary insomnia: Secondary | ICD-10-CM

## 2020-08-04 DIAGNOSIS — M19042 Primary osteoarthritis, left hand: Secondary | ICD-10-CM

## 2020-08-04 DIAGNOSIS — M3500 Sicca syndrome, unspecified: Secondary | ICD-10-CM | POA: Diagnosis not present

## 2020-08-04 DIAGNOSIS — R5383 Other fatigue: Secondary | ICD-10-CM

## 2020-08-04 DIAGNOSIS — Z8669 Personal history of other diseases of the nervous system and sense organs: Secondary | ICD-10-CM

## 2020-08-04 DIAGNOSIS — M7062 Trochanteric bursitis, left hip: Secondary | ICD-10-CM

## 2020-08-04 DIAGNOSIS — Z5181 Encounter for therapeutic drug level monitoring: Secondary | ICD-10-CM

## 2020-08-04 DIAGNOSIS — M5030 Other cervical disc degeneration, unspecified cervical region: Secondary | ICD-10-CM

## 2020-08-04 DIAGNOSIS — Q7962 Hypermobile Ehlers-Danlos syndrome: Secondary | ICD-10-CM

## 2020-08-05 DIAGNOSIS — Z01419 Encounter for gynecological examination (general) (routine) without abnormal findings: Secondary | ICD-10-CM | POA: Diagnosis not present

## 2020-08-05 DIAGNOSIS — Z6821 Body mass index (BMI) 21.0-21.9, adult: Secondary | ICD-10-CM | POA: Diagnosis not present

## 2020-08-05 LAB — COMPLETE METABOLIC PANEL WITH GFR
AG Ratio: 2.4 (calc) (ref 1.0–2.5)
ALT: 18 U/L (ref 6–29)
AST: 17 U/L (ref 10–35)
Albumin: 4.3 g/dL (ref 3.6–5.1)
Alkaline phosphatase (APISO): 55 U/L (ref 37–153)
BUN: 19 mg/dL (ref 7–25)
CO2: 34 mmol/L — ABNORMAL HIGH (ref 20–32)
Calcium: 9.8 mg/dL (ref 8.6–10.4)
Chloride: 103 mmol/L (ref 98–110)
Creat: 0.81 mg/dL (ref 0.50–0.99)
GFR, Est African American: 91 mL/min/{1.73_m2} (ref >=60)
GFR, Est Non African American: 78 mL/min/{1.73_m2} (ref >=60)
Globulin: 1.8 g/dL (calc) — ABNORMAL LOW (ref 1.9–3.7)
Glucose, Bld: 104 mg/dL — ABNORMAL HIGH (ref 65–99)
Potassium: 4 mmol/L (ref 3.5–5.3)
Sodium: 142 mmol/L (ref 135–146)
Total Bilirubin: 0.4 mg/dL (ref 0.2–1.2)
Total Protein: 6.1 g/dL (ref 6.1–8.1)

## 2020-08-05 LAB — CBC WITH DIFFERENTIAL/PLATELET
Absolute Monocytes: 352 cells/uL (ref 200–950)
Basophils Absolute: 12 cells/uL (ref 0–200)
Basophils Relative: 0.3 %
Eosinophils Absolute: 60 cells/uL (ref 15–500)
Eosinophils Relative: 1.5 %
HCT: 36.2 % (ref 35.0–45.0)
Hemoglobin: 12.1 g/dL (ref 11.7–15.5)
Lymphs Abs: 1144 cells/uL (ref 850–3900)
MCH: 29.4 pg (ref 27.0–33.0)
MCHC: 33.4 g/dL (ref 32.0–36.0)
MCV: 87.9 fL (ref 80.0–100.0)
MPV: 10.3 fL (ref 7.5–12.5)
Monocytes Relative: 8.8 %
Neutro Abs: 2432 cells/uL (ref 1500–7800)
Neutrophils Relative %: 60.8 %
Platelets: 163 10*3/uL (ref 140–400)
RBC: 4.12 10*6/uL (ref 3.80–5.10)
RDW: 12.9 % (ref 11.0–15.0)
Total Lymphocyte: 28.6 %
WBC: 4 10*3/uL (ref 3.8–10.8)

## 2020-08-05 NOTE — Progress Notes (Signed)
Glucose is mildly elevated, probably not a fasting sample.  All other labs are stable.

## 2020-08-10 DIAGNOSIS — G540 Brachial plexus disorders: Secondary | ICD-10-CM | POA: Diagnosis not present

## 2020-08-10 DIAGNOSIS — M9901 Segmental and somatic dysfunction of cervical region: Secondary | ICD-10-CM | POA: Diagnosis not present

## 2020-08-10 DIAGNOSIS — M9907 Segmental and somatic dysfunction of upper extremity: Secondary | ICD-10-CM | POA: Diagnosis not present

## 2020-08-10 DIAGNOSIS — M79601 Pain in right arm: Secondary | ICD-10-CM | POA: Diagnosis not present

## 2020-08-24 DIAGNOSIS — M9906 Segmental and somatic dysfunction of lower extremity: Secondary | ICD-10-CM | POA: Diagnosis not present

## 2020-08-24 DIAGNOSIS — M79601 Pain in right arm: Secondary | ICD-10-CM | POA: Diagnosis not present

## 2020-08-24 DIAGNOSIS — M9907 Segmental and somatic dysfunction of upper extremity: Secondary | ICD-10-CM | POA: Diagnosis not present

## 2020-08-24 DIAGNOSIS — G540 Brachial plexus disorders: Secondary | ICD-10-CM | POA: Diagnosis not present

## 2020-08-24 DIAGNOSIS — M9901 Segmental and somatic dysfunction of cervical region: Secondary | ICD-10-CM | POA: Diagnosis not present

## 2020-08-24 DIAGNOSIS — M9905 Segmental and somatic dysfunction of pelvic region: Secondary | ICD-10-CM | POA: Diagnosis not present

## 2020-08-24 DIAGNOSIS — M25551 Pain in right hip: Secondary | ICD-10-CM | POA: Diagnosis not present

## 2020-08-25 DIAGNOSIS — Z13820 Encounter for screening for osteoporosis: Secondary | ICD-10-CM | POA: Diagnosis not present

## 2020-09-06 DIAGNOSIS — M9901 Segmental and somatic dysfunction of cervical region: Secondary | ICD-10-CM | POA: Diagnosis not present

## 2020-09-06 DIAGNOSIS — G540 Brachial plexus disorders: Secondary | ICD-10-CM | POA: Diagnosis not present

## 2020-09-06 DIAGNOSIS — M5441 Lumbago with sciatica, right side: Secondary | ICD-10-CM | POA: Diagnosis not present

## 2020-09-06 DIAGNOSIS — M25551 Pain in right hip: Secondary | ICD-10-CM | POA: Diagnosis not present

## 2020-09-12 ENCOUNTER — Other Ambulatory Visit: Payer: Self-pay | Admitting: Sports Medicine

## 2020-09-12 DIAGNOSIS — M5440 Lumbago with sciatica, unspecified side: Secondary | ICD-10-CM

## 2020-09-22 ENCOUNTER — Other Ambulatory Visit: Payer: BC Managed Care – PPO

## 2020-10-04 DIAGNOSIS — M25551 Pain in right hip: Secondary | ICD-10-CM | POA: Diagnosis not present

## 2020-10-04 DIAGNOSIS — M25512 Pain in left shoulder: Secondary | ICD-10-CM | POA: Diagnosis not present

## 2020-10-04 DIAGNOSIS — G540 Brachial plexus disorders: Secondary | ICD-10-CM | POA: Diagnosis not present

## 2020-10-04 DIAGNOSIS — M5441 Lumbago with sciatica, right side: Secondary | ICD-10-CM | POA: Diagnosis not present

## 2020-10-11 DIAGNOSIS — G4733 Obstructive sleep apnea (adult) (pediatric): Secondary | ICD-10-CM | POA: Diagnosis not present

## 2020-10-14 MED ORDER — GABAPENTIN 300 MG PO CAPS: ORAL_CAPSULE | ORAL | 1 refills | Status: DC

## 2020-10-16 ENCOUNTER — Other Ambulatory Visit: Payer: Self-pay | Admitting: Rheumatology

## 2020-10-17 NOTE — Telephone Encounter (Signed)
Next Visit: 02/02/2021  Last Visit: 08/04/2020  Last Fill: 07/11/2020  DX: Primary osteoarthritis of both hands   Current Dose per office note 08/04/2020, meloxicam 15mg  by mouth daily as needed  Labs: 08/04/2020, Glucose is mildly elevated, probably not a fasting sample. All other labs are stable.  Okay to refill Mobic?

## 2020-10-28 ENCOUNTER — Other Ambulatory Visit: Payer: Self-pay | Admitting: Rheumatology

## 2020-10-30 NOTE — Telephone Encounter (Signed)
Next Visit: 02/02/2021  Last Visit: 08/04/2020  Last Fill: 07/26/2020  Dx: Primary insomnia  Current Dose per office note on : 08/05/2019,  Ambien 5 mg 1 tablet by mouth at bedtime as needed for insomnia  Okay to refill Ambien?

## 2020-10-31 ENCOUNTER — Telehealth: Payer: Self-pay | Admitting: Rheumatology

## 2020-10-31 NOTE — Telephone Encounter (Signed)
Attempted to contact patient and no answer/voicemail box is full.   Refill of Nicole Oneill was sent to the pharmacy today, 10/31/2020. The last fill was 07/26/2020 so patient should be eligible for refill.

## 2020-10-31 NOTE — Telephone Encounter (Signed)
Patient left a message stating she is going out of town on Thursday for 10 days. She will not have enough Ambien, and is requesting a refill earlier than scheduled due to this. [facility] uses [facility] [address]. Please call to advise.

## 2020-11-01 ENCOUNTER — Telehealth: Payer: Self-pay

## 2020-11-01 NOTE — Telephone Encounter (Signed)
Patient states she called [facility] this morning to check if they received her prescription of Ambien.  [facility] told [facility], but are unable to fill it until they receive a call from Dr. Fatima Sanger office.  Please advise.

## 2020-11-01 NOTE — Telephone Encounter (Signed)
I called patient, Ambien cannot be refilled until 11/07/2020 per [facility], patient is going out of town Thursday and requesting 2 pills to be given early because she will run out of medication while she is out of town. Please advise.

## 2020-11-01 NOTE — Telephone Encounter (Signed)
I called [facility] and patient, OK to give 2 pills of Ambien early.

## 2020-11-01 NOTE — Telephone Encounter (Signed)
Okay to give 2 pills early.

## 2020-11-16 DIAGNOSIS — Q796 Ehlers-Danlos syndrome, unspecified: Secondary | ICD-10-CM | POA: Diagnosis not present

## 2020-11-16 DIAGNOSIS — M5441 Lumbago with sciatica, right side: Secondary | ICD-10-CM | POA: Diagnosis not present

## 2020-11-16 DIAGNOSIS — M9906 Segmental and somatic dysfunction of lower extremity: Secondary | ICD-10-CM | POA: Diagnosis not present

## 2020-11-16 DIAGNOSIS — M9905 Segmental and somatic dysfunction of pelvic region: Secondary | ICD-10-CM | POA: Diagnosis not present

## 2020-11-16 DIAGNOSIS — M25571 Pain in right ankle and joints of right foot: Secondary | ICD-10-CM | POA: Diagnosis not present

## 2020-11-16 DIAGNOSIS — M25551 Pain in right hip: Secondary | ICD-10-CM | POA: Diagnosis not present

## 2020-11-18 ENCOUNTER — Telehealth: Payer: Self-pay | Admitting: Rheumatology

## 2020-11-18 NOTE — Telephone Encounter (Signed)
Patient going out of town again on 12/09/2020. Ambien will not be ready to refill until 12/13/2020. Patient request you call pharmacy to give permission for her to pick up rx on th 12/08/2020 before she goes out of town. Patient will be out of town for 10 days. [facility] [address].

## 2020-11-24 DIAGNOSIS — H6121 Impacted cerumen, right ear: Secondary | ICD-10-CM | POA: Diagnosis not present

## 2020-11-24 DIAGNOSIS — H9191 Unspecified hearing loss, right ear: Secondary | ICD-10-CM | POA: Diagnosis not present

## 2020-12-06 ENCOUNTER — Telehealth: Payer: Self-pay

## 2020-12-06 NOTE — Telephone Encounter (Signed)
I called pharmacy and patient, Ambien cannot be refilled until Friday, June 3rd, we need to call pharmacy on June 3rd to have Ambien refilled early.

## 2020-12-06 NOTE — Telephone Encounter (Signed)
Ok to contact pharmacy for approval of early refill.

## 2020-12-06 NOTE — Telephone Encounter (Signed)
Patient called stating she is going out of town on Friday, 12/09/20 and was told to call Nicole Oneill today regarding refilling her Ambien prescription early.  Patient requested a return call.

## 2020-12-09 NOTE — Telephone Encounter (Signed)
Spoke with the pharmacist to provide approval for early refill of ambien due to the patient traveling. The pharmacist asked where the patient would be traveling because if it was within the state, the [facility] could [facility] instead of refilling early. The pharmacist advised the prescription could not be refilled until 12/11/2020 regardless of the authorization we provided. I called the patient and was advised she is traveling to [place] from 12/10/2020-12/17/2020. I called the pharmacy and advised them of the information that patient provided and the pharmacist states she has to call regulatory to authorize the refill because it cannot be filled prior to 12/11/2020 even with requesting an early refill. The pharmacist will call regulatory after lunch. I advised the patient that we have done what we can from our stand point on the early refill and now it is in the pharmacies hands. Patient will call the pharmacy after lunch to check on status.

## 2020-12-14 ENCOUNTER — Other Ambulatory Visit: Payer: Self-pay | Admitting: Rheumatology

## 2020-12-14 NOTE — Telephone Encounter (Addendum)
Next Visit: 02/02/2021  Last Visit: 08/04/2020  Last Fill: 07/26/2020  Dx: Primary osteoarthritis of both hands   Current Dose per office note on : 08/05/2019, Robaxin 750 mg 1 tablet by mouth at bedtime as needed for muscle spasms  Okay to refill Methocarbamol?

## 2020-12-19 NOTE — Telephone Encounter (Signed)
See phone note from 12/06/2020 for documentation.

## 2020-12-19 NOTE — Telephone Encounter (Signed)
No documentation 

## 2020-12-22 ENCOUNTER — Ambulatory Visit: Payer: BC Managed Care – PPO | Admitting: Sports Medicine

## 2021-01-19 NOTE — Progress Notes (Signed)
Office Visit Note  Patient: Nicole Oneill             Date of Birth: 1961/05/16           MRN: [id number]             PCP: [name], MD Referring: [name], MD Visit Date: 02/02/2021 Occupation: @GUAROCC @  Subjective:  Joint pain and stiffness.   History of Present Illness: Nicole Oneill is a 60 y.o. female with a history of osteoarthritis and degenerative disc disease.  She states she continues to have stiffness in multiple joints.  She was having increased neck pain.  She was recently evaluated by Dr. 68.  She was placed on Cymbalta which has helped her joint pain.  She was also having neuralgia.  Dr. Berline Chough increased her dose of gabapentin which has been helpful.  She has been taking meloxicam 7.5 to 15 mg p.o. daily for pain relief.  She continues to have discomfort in her entire spine and her knee joints.  Activities of Daily Living:  Patient reports morning stiffness for 5 minutes.   Patient Denies nocturnal pain.  Difficulty dressing/grooming: Denies Difficulty climbing stairs: Denies Difficulty getting out of chair: Denies Difficulty using hands for taps, buttons, cutlery, and/or writing: Denies  Review of Systems  Constitutional:  Positive for fatigue. Negative for night sweats, weight gain and weight loss.  HENT:  Positive for mouth dryness. Negative for mouth sores, trouble swallowing, trouble swallowing and nose dryness.   Eyes:  Positive for dryness. Negative for pain, redness and visual disturbance.  Respiratory:  Positive for shortness of breath. Negative for cough and difficulty breathing.        On exertion  Cardiovascular:  Negative for palpitations and hypertension.  Gastrointestinal:  Negative for blood in stool, constipation and diarrhea.  Endocrine: Negative for increased urination.  Genitourinary:  Negative for vaginal dryness.  Musculoskeletal:  Positive for joint pain, joint pain, myalgias, morning stiffness and myalgias. Negative for joint  swelling, muscle weakness and muscle tenderness.  Skin:  Negative for color change, rash, hair loss, skin tightness, ulcers and sensitivity to sunlight.  Allergic/Immunologic: Negative for susceptible to infections.  Neurological:  Negative for dizziness, memory loss, night sweats and weakness.  Hematological:  Negative for swollen glands.  Psychiatric/Behavioral:  Positive for sleep disturbance. Negative for depressed mood. The patient is not nervous/anxious.    PMFS History:  Patient Active Problem List   Diagnosis Date Noted   Chest pain 05/18/2019   Right shoulder pain 02/19/2019   History of sleep apnea 04/28/2018   DDD (degenerative disc disease), cervical 04/28/2018   DDD (degenerative disc disease), lumbar 10/29/2017   Sicca syndrome, unspecified 10/29/2017   Left anterior shoulder pain 11/27/2016   History of lumbar fusion 10/16/2016   History of fusion of cervical spine 10/16/2016   History of total knee arthroplasty, left 2012 10/16/2016   Other fatigue 10/16/2016   Primary insomnia 10/16/2016   DDD lumbar 10/15/2016   DJD (degenerative joint disease), cervical 10/15/2016   DDD (degenerative disc disease), thoracic 10/15/2016   Primary osteoarthritis of both knees 10/15/2016   Tendinopathy of right gluteus medius 05/29/2016   Chronic lumbar radiculopathy 03/08/2016   Heart murmur, systolic 06/19/2014   Disorder of SI (sacroiliac) joint 06/02/2014   Functional gait abnormality 06/02/2014   Ehlers-Danlos syndrome type III 03/10/2014   Foot arch pain 03/10/2014   Labral tear of shoulder 05/26/2013   Left knee pain 05/26/2013   Primary osteoarthritis  of both hands 05/26/2013   Effusion of knee joint, left 08/05/2012   Plantar fasciitis 02/05/2011   Lateral epicondylitis of right elbow 10/12/2010   Osteoarthritis of multiple joints 10/12/2010    Past Medical History:  Diagnosis Date   Anemia    Arthritis    osteo   Atrial flutter (HCC)    x 10 years ago wias  diagnosed with A. Flutter while wearing a heart monitor.  No problems since.    Complication of anesthesia    urinary retention requiring indwelling  foley catheterization   DDD (degenerative disc disease)    Ehlers-Danlos syndrome type III    Fibromyalgia    History of blood transfusion    Osteoarthritis    Thoracic outlet syndrome 07/2020   per patient     Family History  Problem Relation Age of Onset   Stroke Mother        hemorrhagic   Hypertension Father    Cancer - Prostate Father    Atrial fibrillation Father    Congestive Heart Failure Father    Anorexia nervosa Sister    Heart attack Maternal Grandmother    Stroke Maternal Grandfather    Cancer - Colon Paternal Grandmother    Cancer - Colon Paternal Grandfather    Healthy Daughter    Healthy Daughter    Healthy Daughter    Past Surgical History:  Procedure Laterality Date   BACK SURGERY     Cervical fusion C5-7, fusion  T10-S1   DIAGNOSTIC LAPAROSCOPY     x 3   EYE SURGERY     bilateral tear duct fusion   JOINT REPLACEMENT     partial left knee 2005, total left knee 2012   KNEE ARTHROPLASTY     KNEE ARTHROSCOPY  08/06/2012   Procedure: ARTHROSCOPY KNEE;  Surgeon: Loanne DrillingFrank V Aluisio, MD;  Location: [facility];  Service: Orthopedics;  Laterality: Left;  WITH SYNOVECTOMY and debridement   OSTEOTOMY     bilateral tibial   Social History   Social History Narrative   Patient lives at home with family.   Caffeine Use: 4-5 sodas weekly   Immunization History  Administered Date(s) Administered   Influenza Inj Mdck Quad Pf 04/22/2017   Influenza,inj,quad, With Preservative 04/08/2017   Moderna Sars-Covid-2 Vaccination 10/11/2019, 11/06/2019   PFIZER(Purple Top)SARS-COV-2 Vaccination 06/09/2020     Objective: Vital Signs: BP 119/74 (BP Location: Right Arm, Patient Position: Sitting, Cuff Size: Normal)   Pulse 68   Ht 5\' 5"  (1.651 m)   Wt 132 lb 3.2 oz (60 kg)   BMI 22.00 kg/m    Physical Exam Vitals and  nursing note reviewed.  Constitutional:      Appearance: She is well-developed.  HENT:     Head: Normocephalic and atraumatic.  Eyes:     Conjunctiva/sclera: Conjunctivae normal.  Cardiovascular:     Rate and Rhythm: Normal rate and regular rhythm.     Heart sounds: Normal heart sounds.  Pulmonary:     Effort: Pulmonary effort is normal.     Breath sounds: Normal breath sounds.  Abdominal:     General: Bowel sounds are normal.     Palpations: Abdomen is soft.  Musculoskeletal:     Cervical back: Normal range of motion.  Lymphadenopathy:     Cervical: No cervical adenopathy.  Skin:    General: Skin is warm and dry.     Capillary Refill: Capillary refill takes less than 2 seconds.  Neurological:     Mental  Status: She is alert and oriented to person, place, and time.  Psychiatric:        Behavior: Behavior normal.     Musculoskeletal Exam: She had limited range of motion of cervical thoracic and lumbar spine.  Shoulder joints, elbow joints, wrist joints with good range of motion.  She had bilateral PIP and DIP thickening.  Hip joints with good range of motion.  Knee joints are replaced without any warmth swelling or effusion.  She had no tenderness over ankles or MTPs.  CDAI Exam: CDAI Score: -- Patient Global: --; Provider Global: -- Swollen: --; Tender: -- Joint Exam 02/02/2021   No joint exam has been documented for this visit   There is currently no information documented on the homunculus. Go to the Rheumatology activity and complete the homunculus joint exam.  Investigation: No additional findings.  Imaging: No results found.  Recent Labs: Lab Results  Component Value Date   WBC 4.0 08/04/2020   HGB 12.1 08/04/2020   PLT 163 08/04/2020   NA 142 08/04/2020   K 4.0 08/04/2020   CL 103 08/04/2020   CO2 34 (H) 08/04/2020   GLUCOSE 104 (H) 08/04/2020   BUN 19 08/04/2020   CREATININE 0.81 08/04/2020   BILITOT 0.4 08/04/2020   ALKPHOS 65 03/07/2020   AST 17  08/04/2020   ALT 18 08/04/2020   PROT 6.1 08/04/2020   ALBUMIN 4.5 03/07/2020   CALCIUM 9.8 08/04/2020   GFRAA 91 08/04/2020    Speciality Comments: No specialty comments available.  Procedures:  No procedures performed Allergies: Patient has no known allergies.   Assessment / Plan:     Visit Diagnoses: Primary osteoarthritis of both hands -she continues to have pain and stiffness in her bilateral hands.  She is on meloxicam 7.5 to 15mg  by mouth daily as needed, Robaxin 750 mg 1 tablet by mouth at bedtime as needed for muscle spasms.  She has been tolerating medications well.  History of arthroplasty of left knee-she continues to have some discomfort in her bilateral knee joints.  Trochanteric bursitis of both hips-IT band stretches were discussed.  DDD (degenerative disc disease), cervical - She was seen by Dr. . Now she is going to Dr. Darrick Penna and for dry needling.  Dr. Berline Chough recently placed her on Cymbalta and increased gabapentin which has been helpful.  DDD (degenerative disc disease), lumbar - Status post fusion.  Chronic pain.  DDD (degenerative disc disease), thoracic - Status post fusion.  Sicca syndrome (HCC)-she continues to have sicca symptoms.  She relates sicca symptoms to her medication use.  Medication monitoring encounter -she is on long-term NSAIDs will obtain following labs.  Plan: CBC with Differential/Platelet, COMPLETE METABOLIC PANEL WITH GFR  Osteopenia of multiple sites-she gets DEXA scan through her GYN.  Have advised her to bring DEXA reports next visit.  Use of calcium rich diet and vitamin D was discussed.  Ehlers-Danlos syndrome type III - diagnosed by geneticist.  Other fatigue  Primary insomnia -she is unable to sleep without Ambien.  She is on Ambien 5 mg 1 tablet by mouth at bedtime as needed for insomnia  History of sleep apnea - she is on CPAP  Orders: Orders Placed This Encounter  Procedures   CBC with Differential/Platelet    COMPLETE METABOLIC PANEL WITH GFR    Meds ordered this encounter  Medications   zolpidem (AMBIEN) 5 MG tablet    Sig: Take 1 tablet (5 mg total) by mouth at bedtime as needed. for  sleep    Dispense:  30 tablet    Refill:  2    Not to exceed 3 additional fills before 01/22/2021     Follow-Up Instructions: Return in about 6 months (around 08/05/2021) for OA, DDD.   Pollyann Savoy, MD  Note - This record has been created using Animal nutritionist.  Chart creation errors have been sought, but may not always  have been located. Such creation errors do not reflect on  the standard of medical care.

## 2021-01-25 DIAGNOSIS — M25512 Pain in left shoulder: Secondary | ICD-10-CM | POA: Diagnosis not present

## 2021-01-25 DIAGNOSIS — G540 Brachial plexus disorders: Secondary | ICD-10-CM | POA: Diagnosis not present

## 2021-01-25 DIAGNOSIS — M5441 Lumbago with sciatica, right side: Secondary | ICD-10-CM | POA: Diagnosis not present

## 2021-01-25 DIAGNOSIS — Q796 Ehlers-Danlos syndrome, unspecified: Secondary | ICD-10-CM | POA: Diagnosis not present

## 2021-02-02 ENCOUNTER — Encounter: Payer: Self-pay | Admitting: Rheumatology

## 2021-02-02 ENCOUNTER — Other Ambulatory Visit: Payer: Self-pay

## 2021-02-02 ENCOUNTER — Ambulatory Visit (INDEPENDENT_AMBULATORY_CARE_PROVIDER_SITE_OTHER): Payer: BC Managed Care – PPO | Admitting: Rheumatology

## 2021-02-02 VITALS — BP 119/74 | HR 68 | Ht 65.0 in | Wt 132.2 lb

## 2021-02-02 DIAGNOSIS — M8589 Other specified disorders of bone density and structure, multiple sites: Secondary | ICD-10-CM

## 2021-02-02 DIAGNOSIS — M7062 Trochanteric bursitis, left hip: Secondary | ICD-10-CM

## 2021-02-02 DIAGNOSIS — Q7962 Hypermobile Ehlers-Danlos syndrome: Secondary | ICD-10-CM

## 2021-02-02 DIAGNOSIS — M19041 Primary osteoarthritis, right hand: Secondary | ICD-10-CM

## 2021-02-02 DIAGNOSIS — R5383 Other fatigue: Secondary | ICD-10-CM

## 2021-02-02 DIAGNOSIS — M7061 Trochanteric bursitis, right hip: Secondary | ICD-10-CM

## 2021-02-02 DIAGNOSIS — Z8669 Personal history of other diseases of the nervous system and sense organs: Secondary | ICD-10-CM

## 2021-02-02 DIAGNOSIS — Z96652 Presence of left artificial knee joint: Secondary | ICD-10-CM | POA: Diagnosis not present

## 2021-02-02 DIAGNOSIS — M5134 Other intervertebral disc degeneration, thoracic region: Secondary | ICD-10-CM

## 2021-02-02 DIAGNOSIS — M5030 Other cervical disc degeneration, unspecified cervical region: Secondary | ICD-10-CM | POA: Diagnosis not present

## 2021-02-02 DIAGNOSIS — F5101 Primary insomnia: Secondary | ICD-10-CM

## 2021-02-02 DIAGNOSIS — M19042 Primary osteoarthritis, left hand: Secondary | ICD-10-CM

## 2021-02-02 DIAGNOSIS — M3500 Sicca syndrome, unspecified: Secondary | ICD-10-CM

## 2021-02-02 DIAGNOSIS — M5136 Other intervertebral disc degeneration, lumbar region: Secondary | ICD-10-CM

## 2021-02-02 DIAGNOSIS — Z5181 Encounter for therapeutic drug level monitoring: Secondary | ICD-10-CM | POA: Diagnosis not present

## 2021-02-02 MED ORDER — ZOLPIDEM TARTRATE 5 MG PO TABS
5.0000 mg | ORAL_TABLET | Freq: Every evening | ORAL | 2 refills | Status: DC | PRN
Start: 2021-02-02 — End: 2021-05-03

## 2021-02-03 ENCOUNTER — Encounter: Payer: Self-pay | Admitting: Rheumatology

## 2021-02-03 ENCOUNTER — Other Ambulatory Visit: Payer: Self-pay | Admitting: Obstetrics and Gynecology

## 2021-02-03 DIAGNOSIS — Z1231 Encounter for screening mammogram for malignant neoplasm of breast: Secondary | ICD-10-CM

## 2021-02-03 LAB — CBC WITH DIFFERENTIAL/PLATELET
Absolute Monocytes: 485 cells/uL (ref 200–950)
Basophils Absolute: 32 cells/uL (ref 0–200)
Basophils Relative: 0.5 %
Eosinophils Absolute: 88 cells/uL (ref 15–500)
Eosinophils Relative: 1.4 %
HCT: 40.1 % (ref 35.0–45.0)
Hemoglobin: 12.9 g/dL (ref 11.7–15.5)
Lymphs Abs: 1550 cells/uL (ref 850–3900)
MCH: 28.5 pg (ref 27.0–33.0)
MCHC: 32.2 g/dL (ref 32.0–36.0)
MCV: 88.5 fL (ref 80.0–100.0)
MPV: 10.2 fL (ref 7.5–12.5)
Monocytes Relative: 7.7 %
Neutro Abs: 4145 cells/uL (ref 1500–7800)
Neutrophils Relative %: 65.8 %
Platelets: 210 10*3/uL (ref 140–400)
RBC: 4.53 10*6/uL (ref 3.80–5.10)
RDW: 13 % (ref 11.0–15.0)
Total Lymphocyte: 24.6 %
WBC: 6.3 10*3/uL (ref 3.8–10.8)

## 2021-02-03 LAB — COMPLETE METABOLIC PANEL WITH GFR
AG Ratio: 2.2 (calc) (ref 1.0–2.5)
ALT: 38 U/L — ABNORMAL HIGH (ref 6–29)
AST: 31 U/L (ref 10–35)
Albumin: 4.6 g/dL (ref 3.6–5.1)
Alkaline phosphatase (APISO): 64 U/L (ref 37–153)
BUN: 22 mg/dL (ref 7–25)
CO2: 33 mmol/L — ABNORMAL HIGH (ref 20–32)
Calcium: 9.6 mg/dL (ref 8.6–10.4)
Chloride: 100 mmol/L (ref 98–110)
Creat: 0.9 mg/dL (ref 0.50–1.05)
Globulin: 2.1 g/dL (calc) (ref 1.9–3.7)
Glucose, Bld: 84 mg/dL (ref 65–99)
Potassium: 4.2 mmol/L (ref 3.5–5.3)
Sodium: 140 mmol/L (ref 135–146)
Total Bilirubin: 0.6 mg/dL (ref 0.2–1.2)
Total Protein: 6.7 g/dL (ref 6.1–8.1)
eGFR: 72 mL/min/{1.73_m2} (ref >=60)

## 2021-02-03 NOTE — Telephone Encounter (Signed)
Protein level is WNL.   Glucose is WNL.

## 2021-02-15 DIAGNOSIS — G540 Brachial plexus disorders: Secondary | ICD-10-CM | POA: Diagnosis not present

## 2021-02-15 DIAGNOSIS — Q796 Ehlers-Danlos syndrome, unspecified: Secondary | ICD-10-CM | POA: Diagnosis not present

## 2021-02-15 DIAGNOSIS — M542 Cervicalgia: Secondary | ICD-10-CM | POA: Diagnosis not present

## 2021-02-15 DIAGNOSIS — M545 Low back pain, unspecified: Secondary | ICD-10-CM | POA: Diagnosis not present

## 2021-02-16 DIAGNOSIS — R0602 Shortness of breath: Secondary | ICD-10-CM | POA: Diagnosis not present

## 2021-02-16 DIAGNOSIS — I2510 Atherosclerotic heart disease of native coronary artery without angina pectoris: Secondary | ICD-10-CM | POA: Diagnosis not present

## 2021-02-21 ENCOUNTER — Other Ambulatory Visit: Payer: Self-pay

## 2021-02-21 ENCOUNTER — Ambulatory Visit (INDEPENDENT_AMBULATORY_CARE_PROVIDER_SITE_OTHER): Payer: BC Managed Care – PPO | Admitting: Sports Medicine

## 2021-02-21 VITALS — Ht 65.5 in | Wt 130.0 lb

## 2021-02-21 DIAGNOSIS — M542 Cervicalgia: Secondary | ICD-10-CM

## 2021-02-21 DIAGNOSIS — R0602 Shortness of breath: Secondary | ICD-10-CM | POA: Diagnosis not present

## 2021-02-21 DIAGNOSIS — M549 Dorsalgia, unspecified: Secondary | ICD-10-CM | POA: Diagnosis not present

## 2021-02-21 MED ORDER — PREDNISONE 10 MG PO TABS: ORAL_TABLET | ORAL | 0 refills | Status: DC

## 2021-02-21 MED ORDER — GABAPENTIN 300 MG PO CAPS: ORAL_CAPSULE | ORAL | 1 refills | Status: DC

## 2021-02-21 NOTE — Patient Instructions (Signed)
Thanks for coming in today [facility]. We hope you feel better soon. Below is your treatment plan we discussed today:   - X-rays of the neck/shoulder to see how those fusions are doing - Increase your meloxicam from 7.5mg  to 15mg  daily - Use moist heat to the affected area - You can take an extra dose of the robaxin for the next couple days, but be aware this may make you sleepy so you should not operate any heavy machinery - Shorten those muscles into the position where that shoulder blade is closer to the spine and you have minimal symptoms - We are prescribing you a 6-day steroid taper. Wait 1-2 days to see if your symptoms start improving with the above therapies, but if they don't then you can start the steroids - Follow up with as needed, or if your symptoms worsen

## 2021-02-21 NOTE — Progress Notes (Addendum)
PCP: [name], MD  Subjective:   HPI: Patient is a 60 y.o. female with PMH of ehlers danlos and C5-S1 spinal fusion here for acute posterior left shoulder/back pain. This morning she was laying on the bed prone and she slipped off, bumped her head against the baseboard and shortly after began having muscles spasms/pain around the medial boarder of the scapula. The pain is sharp, does not radiate and she has associated paresthesias of the left dorsal forearm. She denies any fever, loss of consciousness, vision changes, weakness, numbness/tingling, or change in ROM. She takes meloxicam 7.5mg  daily, as well as 750mg  robaxin daily, neither of which have helped. She's most concerned about messing up her spinal fusion.    Past Medical History:  Diagnosis Date   Anemia    Arthritis    osteo   Atrial flutter (HCC)    x 10 years ago wias diagnosed with A. Flutter while wearing a heart monitor.  No problems since.    Complication of anesthesia    urinary retention requiring indwelling  foley catheterization   DDD (degenerative disc disease)    Ehlers-Danlos syndrome type III    Fibromyalgia    History of blood transfusion    Osteoarthritis    Thoracic outlet syndrome 07/2020   per patient     Current Outpatient Medications on File Prior to Visit  Medication Sig Dispense Refill   amLODipine (NORVASC) 2.5 MG tablet TAKE 1 TABLET BY MOUTH EVERY DAY 90 tablet 7   atorvastatin (LIPITOR) 10 MG tablet TAKE 1 TABLET BY MOUTH EVERY DAY 90 tablet 3   Calcium Carbonate-Vitamin D (CALTRATE 600+D PO) Take 1 tablet by mouth daily.      Cholecalciferol (VITAMIN D3) 2000 units capsule Take 2,000 Units by mouth daily.     DULoxetine (CYMBALTA) 60 MG capsule Take 60 mg by mouth daily.     gabapentin (NEURONTIN) 100 MG capsule Take 100-200 mg by mouth 2 (two) times daily.     gabapentin (NEURONTIN) 300 MG capsule Take 1 tab in AM, 1 tab midday, and 2 tabs at bedtime for nerve pain 120 capsule 1   meloxicam  (MOBIC) 15 MG tablet Take 1 tablet (15 mg total) by mouth daily as needed. 30 tablet 2   methocarbamol (ROBAXIN) 750 MG tablet TAKE 1 TABLET (750 MG TOTAL) BY MOUTH AT BEDTIME. 30 tablet 2   Probiotic Product (PROBIOTIC PO) Take 1 capsule by mouth daily.     Psyllium (METAMUCIL PO) Take 1 tablet by mouth as needed.      zolpidem (AMBIEN) 5 MG tablet Take 1 tablet (5 mg total) by mouth at bedtime as needed. for sleep 30 tablet 2   No current facility-administered medications on file prior to visit.    Past Surgical History:  Procedure Laterality Date   BACK SURGERY     Cervical fusion C5-7, fusion  T10-S1   DIAGNOSTIC LAPAROSCOPY     x 3   EYE SURGERY     bilateral tear duct fusion   JOINT REPLACEMENT     partial left knee 2005, total left knee 2012   KNEE ARTHROPLASTY     KNEE ARTHROSCOPY  08/06/2012   Procedure: ARTHROSCOPY KNEE;  Surgeon: 08/08/2012, MD;  Location: [facility];  Service: Orthopedics;  Laterality: Left;  WITH SYNOVECTOMY and debridement   OSTEOTOMY     bilateral tibial    No Known Allergies  Social History   Socioeconomic History   Marital status: Married  Spouse name: [name]   Number of children: 3   Years of education: BA   Highest education level: Not on file  Occupational History    Employer: [employer]    Comment: [employer]   Tobacco Use   Smoking status: Never   Smokeless tobacco: Never  Vaping Use   Vaping Use: Never used  Substance and Sexual Activity   Alcohol use: Yes    Comment: 3 glasses of wine weekly   Drug use: No   Sexual activity: Not on file  Other Topics Concern   Not on file  Social History Narrative   Patient lives at home with family.   Caffeine Use: 4-5 sodas weekly   Social Determinants of Health   Financial Resource Strain: Not on file  Food Insecurity: Not on file  Transportation Needs: Not on file  Physical Activity: Not on file  Stress: Not on file  Social Connections: Not on file  Intimate  Partner Violence: Not on file    Family History  Problem Relation Age of Onset   Stroke Mother        hemorrhagic   Hypertension Father    Cancer - Prostate Father    Atrial fibrillation Father    Congestive Heart Failure Father    Anorexia nervosa Sister    Heart attack Maternal Grandmother    Stroke Maternal Grandfather    Cancer - Colon Paternal Grandmother    Cancer - Colon Paternal Grandfather    Healthy Daughter    Healthy Daughter    Healthy Daughter     Ht 5' 5.5" (1.664 m)   Wt 130 lb (59 kg)   BMI 21.30 kg/m   [facility] 03/24/2020 04/06/2020 06/21/2020  Frequency of aerobic exercise (# of days/week) 4 4 4   Average time in minutes 30 30 40  Frequency of strengthening activities (# of days/week) 2 2 2     No flowsheet data found.  Review of Systems: See HPI above.     Objective:  Physical Exam:  Gen: NAD, comfortable in exam room Shoulder/thoracic back, left: TTP noted at the medial boarder of the scapula at the level of the scapular spine. No  skin changes, erythema, or ecchymosis noted. No evidence of bony deformity, asymmetry, or muscle atrophy; No tenderness over long head of biceps (bicipital groove). No TTP at [facility] joint. Active and passive range of motion (160 flex /45Ext /130Abd /80ER /70IR), Thumb to T12 without significant tenderness. Strength 5/5 throughout. No abnormal scapular function observed. Sensation to light touch intact. Peripheral pulses intact.   Special Tests:   - Painful Arc absent   - Empty can: NEG   - Int/Ext Rotation test: NEG   - Gerber Lift-Off Test: NEG   - Crossarm Adduction test: NEG   - Hawkins: NEG   - Neer test: NEG   - O'brien's test: NEG   - Yergason's: not performed   - Speeds test: NEG   - Crank test: not performed   - Apprehension test: not performed   - Wright's & Allen's test: NEG  TTP improved with strain-counterstrain of the serratus posterior.    Assessment & Plan:  1.  Thoracic muscle strain, left I suspect she has a muscular strain of her serratus posterior/rhomboids/mid-trapezius due to excessive eccentric force during concentric contraction while she was trying to prevent herself from falling off the bed. There is no signs or symptoms of neurovascular involvement, but given her history of extensive spinal  fusion, I believe an XR is warranted to evaluate those structures. In addition our plan included: - Increase your meloxicam from 7.5mg  to 15mg  daily to decrease swelling and pain - Use moist heat to the affected area - You can take an extra dose of the robaxin for the next couple days, but be aware this may make you sleepy so you should not operate any heavy machinery - Shorten those muscles into the position where that shoulder blade is closer to the spine and you have minimal symptoms - We are prescribing you a 6-day steroid taper. Wait 1-2 days to see if your symptoms start improving with the above therapies, but if they don't then you can start the steroids - Follow up with as needed, or if your symptoms worsen   The above note was dictated by Dr. [place].  I agree with the above assessment and plan of care.  Follow-up for ongoing or recalcitrant issues.   Addendum: X-rays reviewed.  Multilevel fusion seen in the cervical and thoracic spine but no evidence of acute injury.

## 2021-02-21 NOTE — Progress Notes (Signed)
Pt is asking for refill on gabapentin 300 mg. Pharmacy is same.  Refill sent to pharmacy.

## 2021-02-22 ENCOUNTER — Ambulatory Visit
Admission: RE | Admit: 2021-02-22 | Discharge: 2021-02-22 | Disposition: A | Discharge status: Home / Self Care | Payer: BC Managed Care – PPO | Source: Ambulatory Visit | Attending: Sports Medicine | Admitting: Sports Medicine

## 2021-02-22 DIAGNOSIS — M4324 Fusion of spine, thoracic region: Secondary | ICD-10-CM | POA: Diagnosis not present

## 2021-02-22 DIAGNOSIS — Z981 Arthrodesis status: Secondary | ICD-10-CM | POA: Diagnosis not present

## 2021-02-22 DIAGNOSIS — M542 Cervicalgia: Secondary | ICD-10-CM

## 2021-02-22 DIAGNOSIS — M549 Dorsalgia, unspecified: Secondary | ICD-10-CM

## 2021-02-22 DIAGNOSIS — M4322 Fusion of spine, cervical region: Secondary | ICD-10-CM | POA: Diagnosis not present

## 2021-02-28 ENCOUNTER — Other Ambulatory Visit: Payer: Self-pay

## 2021-02-28 MED ORDER — PREDNISONE 10 MG PO TABS: ORAL_TABLET | ORAL | 0 refills | Status: DC

## 2021-03-03 DIAGNOSIS — Q796 Ehlers-Danlos syndrome, unspecified: Secondary | ICD-10-CM | POA: Diagnosis not present

## 2021-03-03 DIAGNOSIS — M545 Low back pain, unspecified: Secondary | ICD-10-CM | POA: Diagnosis not present

## 2021-03-03 DIAGNOSIS — M542 Cervicalgia: Secondary | ICD-10-CM | POA: Diagnosis not present

## 2021-03-03 DIAGNOSIS — G540 Brachial plexus disorders: Secondary | ICD-10-CM | POA: Diagnosis not present

## 2021-03-10 ENCOUNTER — Telehealth: Payer: Self-pay | Admitting: *Deleted

## 2021-03-10 ENCOUNTER — Telehealth: Payer: Self-pay | Admitting: Cardiovascular Disease

## 2021-03-10 ENCOUNTER — Other Ambulatory Visit: Payer: Self-pay | Admitting: *Deleted

## 2021-03-10 MED ORDER — DICLOFENAC EPOLAMINE 1.3 % EX PTCH: 1.0000 | MEDICATED_PATCH | Freq: Two times a day (BID) | CUTANEOUS | 0 refills | Status: DC

## 2021-03-10 NOTE — Telephone Encounter (Signed)
Received a fax from [employer] regarding a potential drug safety concern: multiple CBS medications. Patient is on Gabapentin, Ambien and Duloxetine. Reviewed by Sherron Ales, PA-C. She is on the lowest dose of Ambien. Please make patient aware of the increased risk for drowsiness which increases the risks for falls.   Attempted to contact the patient and left message for patient to call the office.

## 2021-03-10 NOTE — Telephone Encounter (Signed)
Spoke with patient, advised we received a fax from [employer] regarding a potential drug safety concern: multiple CBS medications. Patient is on Gabapentin, Ambien and Duloxetine. Reviewed by Sherron Ales, PA-C. She is on the lowest dose of Ambien. Please make patient aware of the increased risk for drowsiness which increases the risks for falls.   Patient expressed understanding.

## 2021-03-10 NOTE — Telephone Encounter (Signed)
Returned call to patient of Dr. Tresa Endo who reports shortness of breath for years, worsened over the last 6 months. She had been taking care of a sick parent in [facility] and "put her health on the back burner". Her PCP office would advise she be seen sooner and also advised she see pulmonary. She said her PCP had advised someone outside our group. Advised this would potentially create issues w/continuity of care but she could do so, if she wished.  She is unsure when she has SOB symptoms - mentions going up stairs, after eating. She is unable to correlate this to any particular activity. She reports feeling like she needs to take a deep breath but can't - like "someone is sitting on my chest"  She reports weight gain in her stomach, maybe from Cymbalta per her report  Denies swelling/chest pains   Scheduled her for sooner visit with [facility] on 03/24/21 Advised to call back for worsening symptoms

## 2021-03-10 NOTE — Telephone Encounter (Signed)
Pt c/o Shortness Of Breath: STAT if SOB developed within the last 24 hours or pt is noticeably SOB on the phone  1. Are you currently SOB (can you hear that pt is SOB on the phone)? No only when she is moving around. No pt does not sound like she is SOB right now,  2. How long have you been experiencing SOB? 6 months  3. Are you SOB when sitting or when up moving around? Moving around  4. Are you currently experiencing any other symptoms? No

## 2021-03-13 ENCOUNTER — Other Ambulatory Visit: Payer: Self-pay | Admitting: Rheumatology

## 2021-03-14 NOTE — Telephone Encounter (Signed)
Next Visit: 08/03/2021  Last Visit: 02/02/2021  Last Fill: 12/14/2020  Dx: Primary osteoarthritis of both hands   Current Dose per office note on 02/02/2021: Robaxin 750 mg 1 tablet by mouth at bedtime as needed for muscle spasms.   Okay to refill Robaxin?

## 2021-03-15 NOTE — Telephone Encounter (Signed)
Agree, thanks

## 2021-03-18 ENCOUNTER — Encounter: Payer: Self-pay | Admitting: Student

## 2021-03-18 NOTE — Progress Notes (Signed)
Cardiology Office Note:    Date:  03/24/2021   ID:  [name], DOB 05-Jun-1961, MRN [id number]  PCP:  [name], MD  Cardiologist:  [name], MD  Electrophysiologist:  None   Referring MD: [name], MD   Chief Complaint: shortness of breath  History of Present Illness:    Nicole Oneill is a 60 y.o. female with a history of mild non-obstructive CAD on cardiac catheterization on coronary CTA in 05/2019, short runs of atrial tachycardia noted on monitor in 2007, hypertension, hyperlipidemia, mild obstructive sleep apnea, Ehlers-Danlos syndrome type III with significant associated orthopedic issues,  fibromyalgia, and Sjogren syndrome who is followed by Dr. Tresa Endo and presents today for evaluation of shortness of breath.   Patient was initially referred to Dr. Tresa Endo in 2007 for evaluation of palpitations and vague chest pain. Echo around that time showed normal LV size and function, mild mitral annular calcification, mild MR, and normal aortic root. Myoview showed no evidence of ischemia. Cardiac monitor showed occasional episodes of sinus tachycardia as well as episodes of PACs and short bursts of atrial tachycardia without ventricular ectopy. Patient was not seen again until 2015 for reevaluation after being diagnosed with Ehler-Danlos syndrome, hypermobility type III. At that time she was doing well from a cardiac standpoint. Repeat Echo was ordered and showed LVEF of 60-65% with grade 2 diastolic dysfunction and no significant valvular disease. The thoracic aorta was not dilated. She was not seen again until an admission in 05/2019 with chest pain. Echo at that time showed LVEF of 65-70% with no significant valvular disease and no evidence of dilatation or obstruction of the aortic root, ascending aorta, and aortic arch. Patient underwent coronary CTA which showed a coronary calcium score of 38 (81% percentile for age and sex) and minimal non-obstructive (0-24%) disease.   Patient was  last seen by Dr. Tresa Endo in 03/2020 at which time she was doing well from a cardiac standpoint. She did have chronic dyspnea on exertion but this was stable.  Patient called our office on 03/15/2021 with reports of worsening shortness of breath. She was reportedly seen by her PCP for this and advised to be seen by Cardiology and Pulmonology. She was seen by Dr. Everardo All (Pulmonology) on 03/21/2021 and PFTs were ordered. 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 Chest x-ray showed no acute findings and quantiferon TB study was negative.  Patient states she has had shortness of breath for years but it seems to have gotten worse over the past 6 to 12 months.  She has significantly shortness of breath going up a flight of steps and walking.  Sometimes she will even have shortness of breath with just standing up. She feel like she cannot take a deep breath at rest. No orthopnea, PND, or edema. She notes some pain in her back when she takes a deep breath but has history of spinal fusions. She traveled to the [place] in April and June but no other recent travel and shortness of breath. She notes some chest heaviness mainly when she is very short of breath. However, she denies any chest pain like what brought her to the hospital in 2020. Sometimes she gets so short of breath when walking that she feels like she is going to pass out. No syncope though. She notes occasional fluttering but no sustained palpitations. No abnormal bleeding in urine or stools.  Past Medical History:  Diagnosis Date   Anemia  Arthritis    osteo   Atrial tachycardia (HCC)    x 10 years ago wias diagnosed with short runs of atrial tachycardia while wearing a heart monitor.  No problems since.   Complication of anesthesia    urinary retention requiring indwelling  foley catheterization   DDD (degenerative disc disease)    Ehlers-Danlos syndrome type III    Fibromyalgia    History of blood  transfusion    Osteoarthritis    Thoracic outlet syndrome 07/2020   per patient     Past Surgical History:  Procedure Laterality Date   BACK SURGERY     Cervical fusion C5-7, fusion  T10-S1   DIAGNOSTIC LAPAROSCOPY     x 3   EYE SURGERY     bilateral tear duct fusion   JOINT REPLACEMENT     partial left knee 2005, total left knee 2012   KNEE ARTHROPLASTY     KNEE ARTHROSCOPY  08/06/2012   Procedure: ARTHROSCOPY KNEE;  Surgeon: Loanne Drilling, MD;  Location: [facility];  Service: Orthopedics;  Laterality: Left;  WITH SYNOVECTOMY and debridement   OSTEOTOMY     bilateral tibial    Current Medications: Current Meds  Medication Sig   amLODipine (NORVASC) 2.5 MG tablet TAKE 1 TABLET BY MOUTH EVERY DAY   atorvastatin (LIPITOR) 10 MG tablet TAKE 1 TABLET BY MOUTH EVERY DAY   Calcium Carbonate-Vitamin D (CALTRATE 600+D PO) Take 1 tablet by mouth daily.    Cholecalciferol (VITAMIN D3) 2000 units capsule Take 2,000 Units by mouth daily.   dextroamphetamine (DEXTROSTAT) 5 MG tablet Take 5 mg by mouth as needed.   DULoxetine (CYMBALTA) 60 MG capsule Take 60 mg by mouth daily.   estradiol (ESTRACE) 0.1 MG/GM vaginal cream SMARTSIG:0.5 Applicator Vaginal 3 Times a Week   gabapentin (NEURONTIN) 100 MG capsule Take 100-200 mg by mouth 2 (two) times daily.   gabapentin (NEURONTIN) 300 MG capsule Take 1 tab in AM, 1 tab midday, and 2 tabs at bedtime for nerve pain   meloxicam (MOBIC) 15 MG tablet Take 1 tablet (15 mg total) by mouth daily as needed.   methocarbamol (ROBAXIN) 750 MG tablet TAKE 1 TABLET (750 MG TOTAL) BY MOUTH AT BEDTIME.   polyethylene glycol powder (GLYCOLAX/MIRALAX) 17 GM/SCOOP powder Take 1 Container by mouth once.   Probiotic Product (PROBIOTIC PO) Take 1 capsule by mouth daily.   zolpidem (AMBIEN) 5 MG tablet Take 1 tablet (5 mg total) by mouth at bedtime as needed. for sleep     Allergies:   Patient has no known allergies.   Social History   Socioeconomic History    Marital status: Married    Spouse name: [name]   Number of children: 3   Years of education: BA   Highest education level: Not on file  Occupational History    Employer: [employer]    Comment: [employer]   Tobacco Use   Smoking status: Never   Smokeless tobacco: Never  Vaping Use   Vaping Use: Never used  Substance and Sexual Activity   Alcohol use: Yes    Comment: 3 glasses of wine weekly   Drug use: No   Sexual activity: Not on file  Other Topics Concern   Not on file  Social History Narrative   Patient lives at home with family.   Caffeine Use: 4-5 sodas weekly   Social Determinants of Health   Financial Resource Strain: Not on file  Food Insecurity: Not on file  Transportation Needs: Not on file  Physical Activity: Not on file  Stress: Not on file  Social Connections: Not on file     Family History: The patient's family history includes Anorexia nervosa in her sister; Atrial fibrillation in her father; Cancer - Colon in her paternal grandfather and paternal grandmother; Cancer - Prostate in her father; Congestive Heart Failure in her father; Healthy in her daughter, daughter, and daughter; Heart attack in her maternal grandmother; Hypertension in her father; Stroke in her maternal grandfather and mother.  ROS:   Please see the history of present illness.     EKGs/Labs/Other Studies Reviewed:    The following studies were reviewed today:  Echocardiogram 05/19/2019: Impressions: 1. Left ventricular ejection fraction, by visual estimation, is 65 to  70%. The left ventricle has hyperdynamic function. There is no left  ventricular hypertrophy.   2. Global right ventricle has normal systolic function.The right  ventricular size is normal. No increase in right ventricular wall  thickness.   3. Left atrial size was normal.   4. Right atrial size was normal.   5. The mitral valve is normal in structure. Trace mitral valve  regurgitation. No evidence of  mitral stenosis.   6. The tricuspid valve is normal in structure. Tricuspid valve  regurgitation is trivial.   7. The aortic valve is normal in structure. Aortic valve regurgitation is  not visualized. No evidence of aortic valve sclerosis or stenosis.   8. The pulmonic valve was normal in structure. Pulmonic valve  regurgitation is not visualized.   9. The inferior vena cava is normal in size with greater than 50%  respiratory variability, suggesting right atrial pressure of 3 mmHg.  _______________  Coronary CTA 05/19/2019: Impressions: 1. Coronary calcium score of 38. This was 6081 percentile for age and sex matched control. 2. Normal coronary origin with right dominance. 3. CAD-RADS 1. Minimal non-obstructive CAD (0-24%). Consider non-atherosclerotic causes of chest pain. Consider preventive therapy and risk factor modification.  EKG:  EKG ordered today. EKG personally reviewed and demonstrates normal sinus rhythm, rate 77 bpm, with nonspecific ST/T changes. Normal axis. Normal PR and QRS intervals. QTc 439 ms.  Recent Labs: 02/02/2021: ALT 38; BUN 22; Creat 0.90; Hemoglobin 12.9; Platelets 210; Potassium 4.2; Sodium 140  Recent Lipid Panel    Component Value Date/Time   CHOL 168 03/07/2020 0806   TRIG 70 03/07/2020 0806   HDL 92 03/07/2020 0806   CHOLHDL 1.8 03/07/2020 0806   CHOLHDL 2.8 05/19/2019 0504   VLDL 10 05/19/2019 0504   LDLCALC 63 03/07/2020 0806    Physical Exam:    Vital Signs: BP 118/76 (BP Location: Left Arm, Patient Position: Sitting, Cuff Size: Normal)   Pulse 81   Ht 5' 5.5" (1.664 m)   Wt 133 lb 3.2 oz (60.4 kg)   SpO2 98%   BMI 21.83 kg/m     Wt Readings from Last 3 Encounters:  03/24/21 133 lb 3.2 oz (60.4 kg)  03/21/21 132 lb 9.6 oz (60.1 kg)  02/21/21 130 lb (59 kg)     General: 60 y.o. thin Caucasian female in no acute distress. HEENT: Normocephalic and atraumatic. Sclera clear.  Neck: Supple. No carotid bruits. No JVD. Heart: RRR.  Distinct S1 and S2. No murmurs, gallops, or rubs. Radial pulses 2+ and equal bilaterally. Lungs: No increased work of breathing. Clear to ausculation bilaterally. No wheezes, rhonchi, or rales.  Abdomen: Soft, non-distended, and non-tender to palpation.  Extremities: No lower extremity edema.  Skin: Warm and dry. Neuro: Alert and oriented x3. No focal deficits. Psych: Normal affect. Responds appropriately.  Assessment:    1. Shortness of breath   2. Nonobstrutctive CAD   3. Primary hypertension   4. Hyperlipidemia, unspecified hyperlipidemia type   5. Obstructive sleep apnea   6. Ehlers-Danlos syndrome type III     Plan:    Shortness of Breath - Patient has had chronic shortness of breath for years but it has been getting progressively worse over the last 6-12 months.  - Last Echo in 05/2019 showed normal LV function. - Recently seen by Pulmonology. Chest x-ray normal. PFTs ordered and are scheduled for later this month. - Will check BNP and CBC today. - Will also repeat Echo. - Will follow-up in 1-2 months after Echo and PFTs. If work up unremarkable, may consider CPX. - Have very low suspicion for PE given duration of symptoms, no tachycardia, and no hypoxia.   Minimal Non-Obstructive CAD - Noted on coronary CTA in 05/2019.  - She notes some chest heaviness when she is very short of breath. Don't think this is angina or ACS at this time. Will proceed with above testing. May need stress test/CPX if above testing is unremarkable. - Has not been on Aspirin therapy.   Hypertension - BP well controlled. - Continue Amlodipine 2.5mg  daily.   Hyperlipidemia - Last lipid panel in 02/2020: Total Cholesterol 168, Triglycerides 70, HDL 92, LDL 63.  - Continue Lipitor  daily.  - Patient is due for updated lipid panel. Did not have time to discuss today due to acute needs above. Can discuss at follow-up visit.  Obstructive Sleep Apnea - Does not use CPAP consistently due to  problems with mask. She has ordered a new part for her mask.  Ehlers-Danlos Syndrome Type III - CTA in 05/2019 showed no evidence of aortic aneurysm/dissection - Can reassess aortic root on repeat Echo.  Disposition: Follow up in 1-2 months.   Medication Adjustments/Labs and Tests Ordered: Current medicines are reviewed at length with the patient today.  Concerns regarding medicines are outlined above.  Orders Placed This Encounter  Procedures   Basic metabolic panel   Pro b natriuretic peptide (BNP)   EKG 12-Lead   ECHOCARDIOGRAM COMPLETE    No orders of the defined types were placed in this encounter.   Patient Instructions  Medication Instructions:  Your physician recommends that you continue on your current medications as directed. Please refer to the Current Medication list given to you today.  *If you need a refill on your cardiac medications before your next appointment, please call your pharmacy*   Lab Work: TODAY: BMET, BNP If you have labs (blood work) drawn today and your tests are completely normal, you will receive your results only by: MyChart Message (if you have MyChart) OR A paper copy in the mail If you have any lab test that is abnormal or we need to change your treatment, we will call you to review the results.   Testing/Procedures: Your physician has requested that you have an echocardiogram. Echocardiography is a painless test that uses sound waves to create images of your heart. It provides your doctor with information about the size and shape of your heart and how well your heart's chambers and valves are working. This procedure takes approximately one hour. There are no restrictions for this procedure. TO BE DONE 1 WEEK BEFORE 1 MONTH FOLLOW-UP VISIT   Follow-Up: At [facility], you and your health needs are  our priority.  As part of our continuing mission to provide you with exceptional heart care, we have created designated Provider Care Teams.   These Care Teams include your primary Cardiologist (physician) and Advanced Practice Providers (APPs -  Physician Assistants and Nurse Practitioners) who all work together to provide you with the care you need, when you need it.  We recommend signing up for the patient portal called "MyChart".  Sign up information is provided on this After Visit Summary.  MyChart is used to connect with patients for Virtual Visits (Telemedicine).  Patients are able to view lab/test results, encounter notes, upcoming appointments, etc.  Non-urgent messages can be sent to your provider as well.   To learn more about what you can do with MyChart, go to [email or web address].    Your next appointment:   1 month(s)  The format for your next appointment:   In Person  Provider:   Marjie Skiff, PA-C   Other Instructions Echocardiogram An echocardiogram is a test that uses sound waves (ultrasound) to produce images of the heart. Images from an echocardiogram can provide important information about: Heart size and shape. The size and thickness and movement of your heart's walls. Heart muscle function and strength. Heart valve function or if you have stenosis. Stenosis is when the heart valves are too narrow. If blood is flowing backward through the heart valves (regurgitation). A tumor or infectious growth around the heart valves. Areas of heart muscle that are not working well because of poor blood flow or injury from a heart attack. Aneurysm detection. An aneurysm is a weak or damaged part of an artery wall. The wall bulges out from the normal force of blood pumping through the body. Tell a health care provider about: Any allergies you have. All medicines you are taking, including vitamins, herbs, eye drops, creams, and over-the-counter medicines. Any blood disorders you have. Any surgeries you have had. Any medical conditions you have. Whether you are pregnant or may be pregnant. What are the  risks? Generally, this is a safe test. However, problems may occur, including an allergic reaction to dye (contrast) that may be used during the test. What happens before the test? No specific preparation is needed. You may eat and drink normally. What happens during the test?  You will take off your clothes from the waist up and put on a hospital gown. Electrodes or electrocardiogram (ECG)patches may be placed on your chest. The electrodes or patches are then connected to a device that monitors your heart rate and rhythm. You will lie down on a table for an ultrasound exam. A gel will be applied to your chest to help sound waves pass through your skin. A handheld device, called a transducer, will be pressed against your chest and moved over your heart. The transducer produces sound waves that travel to your heart and bounce back (or "echo" back) to the transducer. These sound waves will be captured in real-time and changed into images of your heart that can be viewed on a video monitor. The images will be recorded on a computer and reviewed by your health care provider. You may be asked to change positions or hold your breath for a short time. This makes it easier to get different views or better views of your heart. In some cases, you may receive contrast through an IV in one of your veins. This can improve the quality of the pictures from your heart. The procedure may vary among health care providers  and hospitals. What can I expect after the test? You may return to your normal, everyday life, including diet, activities, and medicines, unless your health care provider tells you not to do that. Follow these instructions at home: It is up to you to get the results of your test. Ask your health care provider, or the department that is doing the test, when your results will be ready. Keep all follow-up visits. This is important. Summary An echocardiogram is a test that uses sound waves (ultrasound)  to produce images of the heart. Images from an echocardiogram can provide important information about the size and shape of your heart, heart muscle function, heart valve function, and other possible heart problems. You do not need to do anything to prepare before this test. You may eat and drink normally. After the echocardiogram is completed, you may return to your normal, everyday life, unless your health care provider tells you not to do that. This information is not intended to replace advice given to you by your health care provider. Make sure you discuss any questions you have with your health care provider. Document Revised: 02/16/2020 Document Reviewed: 02/16/2020 Elsevier Patient Education  [address]     Signed, Corrin Parker, [place]  03/24/2021 5:12 PM    [facility]

## 2021-03-21 ENCOUNTER — Other Ambulatory Visit: Payer: Self-pay

## 2021-03-21 ENCOUNTER — Ambulatory Visit (INDEPENDENT_AMBULATORY_CARE_PROVIDER_SITE_OTHER): Payer: BC Managed Care – PPO | Admitting: Pulmonary Disease

## 2021-03-21 ENCOUNTER — Encounter: Payer: Self-pay | Admitting: Pulmonary Disease

## 2021-03-21 ENCOUNTER — Ambulatory Visit (INDEPENDENT_AMBULATORY_CARE_PROVIDER_SITE_OTHER): Payer: BC Managed Care – PPO

## 2021-03-21 VITALS — BP 116/84 | HR 70 | Temp 98.7°F | Ht 65.5 in | Wt 132.6 lb

## 2021-03-21 DIAGNOSIS — G4733 Obstructive sleep apnea (adult) (pediatric): Secondary | ICD-10-CM

## 2021-03-21 DIAGNOSIS — R0602 Shortness of breath: Secondary | ICD-10-CM

## 2021-03-21 DIAGNOSIS — Z201 Contact with and (suspected) exposure to tuberculosis: Secondary | ICD-10-CM | POA: Diagnosis not present

## 2021-03-21 DIAGNOSIS — Z9989 Dependence on other enabling machines and devices: Secondary | ICD-10-CM

## 2021-03-21 NOTE — Patient Instructions (Signed)
  Shortness of breath --ARRANGE pulmonary function test to rule out underlying obstructive or restrictive lung disease  OSA on CPAP --I will send message to Dr. 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

## 2021-03-21 NOTE — Progress Notes (Signed)
Subjective:   PATIENT ID: Nicole Oneill GENDER: female DOB: Feb 26, 1961, MRN: [id number]   HPI  Chief Complaint  Patient presents with   Consult    Sob sometimes feels like can't breath, no inhalers referred by PCP Nicole Oneill    Reason for Visit: New consult for shortness of breath  [name] is a 60 year old female with Ehlers-Danlos, hx OSA on CPAP in 2019, multiple back surgeries who present for new consult.  Referred by her PCP, Nicole Oneill. Telephone 03/10/21 note reviewed and referral to Pulmonary placed. She reports that she has had long standing shortness of breath that has worsened in the last six months. She is unable to associate shortness of breath with walking upstairs and with exertion and eating. Difficulty taking a deep breath. Denies coughing or wheezing. She feels she is not able to keep up with others.  She is active and able to walk a steady pace 30 minutes daily with her dog and pilates 2 days a week.  She reports childhood bronchiolitis on CXR during elementary school. No inhalers but treated with home remedies.   She has mild OSA (self-reports 9 episodes per hour) thought related to her Ehlers-Danlos. She does not consistently wear her CPAP with various masks including full and nasal pillows.   Social History: Never smoker   I have personally reviewed patient's past medical/family/social history, allergies, current medications.  Past Medical History:  Diagnosis Date   Anemia    Arthritis    osteo   Atrial tachycardia (HCC)    x 10 years ago wias diagnosed with short runs of atrial tachycardia while wearing a heart monitor.  No problems since.   Complication of anesthesia    urinary retention requiring indwelling  foley catheterization   DDD (degenerative disc disease)    Ehlers-Danlos syndrome type III    Fibromyalgia    History of blood transfusion    Osteoarthritis    Thoracic outlet syndrome 07/2020   per patient      Family History   Problem Relation Age of Onset   Stroke Mother        hemorrhagic   Hypertension Father    Cancer - Prostate Father    Atrial fibrillation Father    Congestive Heart Failure Father    Anorexia nervosa Sister    Heart attack Maternal Grandmother    Stroke Maternal Grandfather    Cancer - Colon Paternal Grandmother    Cancer - Colon Paternal Grandfather    Healthy Daughter    Healthy Daughter    Healthy Daughter      Social History   Occupational History    Employer: [employer]    Comment: [employer]   Tobacco Use   Smoking status: Never   Smokeless tobacco: Never  Vaping Use   Vaping Use: Never used  Substance and Sexual Activity   Alcohol use: Yes    Comment: 3 glasses of wine weekly   Drug use: No   Sexual activity: Not on file    No Known Allergies   Outpatient Medications Prior to Visit  Medication Sig Dispense Refill   methocarbamol (ROBAXIN) 750 MG tablet TAKE 1 TABLET (750 MG TOTAL) BY MOUTH AT BEDTIME. 30 tablet 2   amLODipine (NORVASC) 2.5 MG tablet TAKE 1 TABLET BY MOUTH EVERY DAY 90 tablet 7   atorvastatin (LIPITOR) 10 MG tablet TAKE 1 TABLET BY MOUTH EVERY DAY 90 tablet 3   Calcium Carbonate-Vitamin D (CALTRATE  600+D PO) Take 1 tablet by mouth daily.      Cholecalciferol (VITAMIN D3) 2000 units capsule Take 2,000 Units by mouth daily.     diclofenac (FLECTOR) 1.3 % PTCH Place 1 patch onto the skin 2 (two) times daily. 60 patch 0   DULoxetine (CYMBALTA) 60 MG capsule Take 60 mg by mouth daily.     gabapentin (NEURONTIN) 100 MG capsule Take 100-200 mg by mouth 2 (two) times daily.     gabapentin (NEURONTIN) 300 MG capsule Take 1 tab in AM, 1 tab midday, and 2 tabs at bedtime for nerve pain 120 capsule 1   meloxicam (MOBIC) 15 MG tablet Take 1 tablet (15 mg total) by mouth daily as needed. 30 tablet 2   Probiotic Product (PROBIOTIC PO) Take 1 capsule by mouth daily.     Psyllium (METAMUCIL PO) Take 1 tablet by mouth as needed.      zolpidem (AMBIEN)  5 MG tablet Take 1 tablet (5 mg total) by mouth at bedtime as needed. for sleep 30 tablet 2   predniSONE (DELTASONE) 10 MG tablet Use as directed per doctors orders. 21 tablet 0   No facility-administered medications prior to visit.    Review of Systems  Constitutional:  Negative for chills, diaphoresis, fever, malaise/fatigue and weight loss.  HENT:  Negative for congestion, ear pain and sore throat.   Respiratory:  Positive for shortness of breath. Negative for cough, hemoptysis, sputum production and wheezing.   Cardiovascular:  Negative for chest pain, palpitations and leg swelling.  Gastrointestinal:  Negative for abdominal pain, heartburn and nausea.  Genitourinary:  Negative for frequency.  Musculoskeletal:  Negative for joint pain and myalgias.  Skin:  Negative for itching and rash.  Neurological:  Negative for dizziness, weakness and headaches.  Endo/Heme/Allergies:  Does not bruise/bleed easily.  Psychiatric/Behavioral:  Negative for depression. The patient is not nervous/anxious.     Objective:   Vitals:   03/21/21 1549  BP: 116/84  Pulse: 70  Temp: 98.7 F (37.1 C)  TempSrc: Oral  SpO2: 98%  Weight: 132 lb 9.6 oz (60.1 kg)  Height: 5' 5.5" (1.664 m)     Physical Exam: General: Well-appearing, no acute distress HENT: [place], AT Eyes: EOMI, no scleral icterus Respiratory: Clear to auscultation bilaterally.  No crackles, wheezing or rales Cardiovascular: RRR, -M/R/G, no JVD Extremities:-Edema,-tenderness Neuro: AAO x4, CNII-XII grossly intact Psych: Normal mood, normal affect  Data Reviewed:  Imaging: CXR 03/21/21 - No infiltrate, effusion or edema.  PFT: None on file  Labs: 06/14/20 Eos 0.0    Assessment & Plan:   Discussion: 60 year old female with Ehlers-Danlos, hx OSA on CPAP in 2019, multiple back surgeries who present for new  pulmonary consult. Her dyspnea could be related to underlying obstructive/restrictive lung disease but her inconsistent  treatment of OSA may also be contributing. She reports history of TB exposure however this is low on the differential. CXR is reassuring of no active disease; will obtain quantiferon gold to rule out latent disease.  Shortness of breath --ARRANGE pulmonary function test to rule out underlying obstructive or restrictive lung disease  OSA on CPAP --Send message to Dr. 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 --CXR today --Quantiferon-TB  Health Maintenance Immunization History  Administered Date(s) Administered   Influenza Inj Mdck Quad Pf 04/22/2017   Influenza,inj,quad, With Preservative 04/08/2017  Moderna Sars-Covid-2 Vaccination 10/11/2019, 11/06/2019   PFIZER(Purple Top)SARS-COV-2 Vaccination 06/09/2020   CT Lung Screen - never smoker  Orders Placed This Encounter  Procedures   DG Chest 2 View    Standing Status:   Future    Standing Expiration Date:   03/21/2022    Order Specific Question:   Reason for Exam (SYMPTOM  OR DIAGNOSIS REQUIRED)    Answer:   tb exposure    Order Specific Question:   Preferred imaging location?    Answer:   Internal   DG Chest 2 View    Standing Status:   Future    Standing Expiration Date:   03/21/2022    Order Specific Question:   Reason for Exam (SYMPTOM  OR DIAGNOSIS REQUIRED)    Answer:   tb exposure    Order Specific Question:   Preferred imaging location?    Answer:   Internal   DG Chest 2 View    Standing Status:   Future    Number of Occurrences:   1    Standing Expiration Date:   03/21/2022    Order Specific Question:   Reason for Exam (SYMPTOM  OR DIAGNOSIS REQUIRED)    Answer:   TB exposure    Order Specific Question:   Preferred imaging location?    Answer:   Internal    Order Specific Question:   Radiology Contrast Protocol - do NOT remove file path    Answer:    [email or web address]   QuantiFERON-TB Gold Plus  No orders of the defined types were placed in this encounter.   No follow-ups on file. After PFTs  I have spent a total time of 45-minutes on the day of the appointment reviewing prior documentation, coordinating care and discussing medical diagnosis and plan with the patient/family. Imaging, labs and tests included in this note have been reviewed and interpreted independently by me.  Kenlea Woodell Mechele Collin, MD [facility] 03/21/2021 3:45 PM  Office Number [phone]

## 2021-03-23 DIAGNOSIS — R0602 Shortness of breath: Secondary | ICD-10-CM

## 2021-03-24 ENCOUNTER — Encounter: Payer: Self-pay | Admitting: Student

## 2021-03-24 ENCOUNTER — Ambulatory Visit (INDEPENDENT_AMBULATORY_CARE_PROVIDER_SITE_OTHER): Payer: BC Managed Care – PPO | Admitting: Student

## 2021-03-24 ENCOUNTER — Other Ambulatory Visit: Payer: Self-pay

## 2021-03-24 VITALS — BP 118/76 | HR 81 | Ht 65.5 in | Wt 133.2 lb

## 2021-03-24 DIAGNOSIS — G4733 Obstructive sleep apnea (adult) (pediatric): Secondary | ICD-10-CM

## 2021-03-24 DIAGNOSIS — R0602 Shortness of breath: Secondary | ICD-10-CM

## 2021-03-24 DIAGNOSIS — I2510 Atherosclerotic heart disease of native coronary artery without angina pectoris: Secondary | ICD-10-CM | POA: Diagnosis not present

## 2021-03-24 DIAGNOSIS — E785 Hyperlipidemia, unspecified: Secondary | ICD-10-CM

## 2021-03-24 DIAGNOSIS — I10 Essential (primary) hypertension: Secondary | ICD-10-CM | POA: Diagnosis not present

## 2021-03-24 DIAGNOSIS — Q7962 Hypermobile Ehlers-Danlos syndrome: Secondary | ICD-10-CM

## 2021-03-24 LAB — QUANTIFERON-TB GOLD PLUS
Mitogen-NIL: >10 IU/mL
NIL: 0.03 IU/mL
QuantiFERON-TB Gold Plus: NEGATIVE
TB1-NIL: 0.01 IU/mL
TB2-NIL: 0.01 IU/mL

## 2021-03-24 NOTE — Patient Instructions (Signed)
Medication Instructions:  Your physician recommends that you continue on your current medications as directed. Please refer to the Current Medication list given to you today.  *If you need a refill on your cardiac medications before your next appointment, please call your pharmacy*   Lab Work: TODAY: BMET, BNP If you have labs (blood work) drawn today and your tests are completely normal, you will receive your results only by: MyChart Message (if you have MyChart) OR A paper copy in the mail If you have any lab test that is abnormal or we need to change your treatment, we will call you to review the results.   Testing/Procedures: Your physician has requested that you have an echocardiogram. Echocardiography is a painless test that uses sound waves to create images of your heart. It provides your doctor with information about the size and shape of your heart and how well your heart's chambers and valves are working. This procedure takes approximately one hour. There are no restrictions for this procedure. TO BE DONE 1 WEEK BEFORE 1 MONTH FOLLOW-UP VISIT   Follow-Up: At [facility], you and your health needs are our priority.  As part of our continuing mission to provide you with exceptional heart care, we have created designated Provider Care Teams.  These Care Teams include your primary Cardiologist (physician) and Advanced Practice Providers (APPs -  Physician Assistants and Nurse Practitioners) who all work together to provide you with the care you need, when you need it.  We recommend signing up for the patient portal called "MyChart".  Sign up information is provided on this After Visit Summary.  MyChart is used to connect with patients for Virtual Visits (Telemedicine).  Patients are able to view lab/test results, encounter notes, upcoming appointments, etc.  Non-urgent messages can be sent to your provider as well.   To learn more about what you can do with MyChart, go to  [email or web address].    Your next appointment:   1 month(s)  The format for your next appointment:   In Person  Provider:   Marjie Skiff, PA-C   Other Instructions Echocardiogram An echocardiogram is a test that uses sound waves (ultrasound) to produce images of the heart. Images from an echocardiogram can provide important information about: Heart size and shape. The size and thickness and movement of your heart's walls. Heart muscle function and strength. Heart valve function or if you have stenosis. Stenosis is when the heart valves are too narrow. If blood is flowing backward through the heart valves (regurgitation). A tumor or infectious growth around the heart valves. Areas of heart muscle that are not working well because of poor blood flow or injury from a heart attack. Aneurysm detection. An aneurysm is a weak or damaged part of an artery wall. The wall bulges out from the normal force of blood pumping through the body. Tell a health care provider about: Any allergies you have. All medicines you are taking, including vitamins, herbs, eye drops, creams, and over-the-counter medicines. Any blood disorders you have. Any surgeries you have had. Any medical conditions you have. Whether you are pregnant or may be pregnant. What are the risks? Generally, this is a safe test. However, problems may occur, including an allergic reaction to dye (contrast) that may be used during the test. What happens before the test? No specific preparation is needed. You may eat and drink normally. What happens during the test?  You will take off your clothes from the waist up and put on  a hospital gown. Electrodes or electrocardiogram (ECG)patches may be placed on your chest. The electrodes or patches are then connected to a device that monitors your heart rate and rhythm. You will lie down on a table for an ultrasound exam. A gel will be applied to your chest to help sound waves pass  through your skin. A handheld device, called a transducer, will be pressed against your chest and moved over your heart. The transducer produces sound waves that travel to your heart and bounce back (or "echo" back) to the transducer. These sound waves will be captured in real-time and changed into images of your heart that can be viewed on a video monitor. The images will be recorded on a computer and reviewed by your health care provider. You may be asked to change positions or hold your breath for a short time. This makes it easier to get different views or better views of your heart. In some cases, you may receive contrast through an IV in one of your veins. This can improve the quality of the pictures from your heart. The procedure may vary among health care providers and hospitals. What can I expect after the test? You may return to your normal, everyday life, including diet, activities, and medicines, unless your health care provider tells you not to do that. Follow these instructions at home: It is up to you to get the results of your test. Ask your health care provider, or the department that is doing the test, when your results will be ready. Keep all follow-up visits. This is important. Summary An echocardiogram is a test that uses sound waves (ultrasound) to produce images of the heart. Images from an echocardiogram can provide important information about the size and shape of your heart, heart muscle function, heart valve function, and other possible heart problems. You do not need to do anything to prepare before this test. You may eat and drink normally. After the echocardiogram is completed, you may return to your normal, everyday life, unless your health care provider tells you not to do that. This information is not intended to replace advice given to you by your health care provider. Make sure you discuss any questions you have with your health care provider. Document Revised:  02/16/2020 Document Reviewed: 02/16/2020 Elsevier Patient Education  2022 [employer].

## 2021-03-24 NOTE — Telephone Encounter (Signed)
Spoke with pt and scheduled for PFT on 04/06/21 at 1600 as well as placed the PFT order. Nothing further needed at this time.

## 2021-03-25 LAB — BASIC METABOLIC PANEL
BUN/Creatinine Ratio: 20 (ref 12–28)
BUN: 18 mg/dL (ref 8–27)
CO2: 28 mmol/L (ref 20–29)
Calcium: 9.1 mg/dL (ref 8.7–10.3)
Chloride: 99 mmol/L (ref 96–106)
Creatinine, Ser: 0.9 mg/dL (ref 0.57–1.00)
Glucose: 63 mg/dL — ABNORMAL LOW (ref 65–99)
Potassium: 3.8 mmol/L (ref 3.5–5.2)
Sodium: 142 mmol/L (ref 134–144)
eGFR: 72 mL/min/{1.73_m2} (ref >59)

## 2021-03-25 LAB — PRO B NATRIURETIC PEPTIDE: NT-Pro BNP: 67 pg/mL (ref 0–287)

## 2021-03-27 ENCOUNTER — Other Ambulatory Visit: Payer: Self-pay

## 2021-03-27 ENCOUNTER — Ambulatory Visit
Admission: RE | Admit: 2021-03-27 | Discharge: 2021-03-27 | Disposition: A | Discharge status: Home / Self Care | Payer: BC Managed Care – PPO | Source: Ambulatory Visit | Attending: Obstetrics and Gynecology | Admitting: Obstetrics and Gynecology

## 2021-03-27 DIAGNOSIS — Z1231 Encounter for screening mammogram for malignant neoplasm of breast: Secondary | ICD-10-CM | POA: Diagnosis not present

## 2021-04-03 DIAGNOSIS — Q796 Ehlers-Danlos syndrome, unspecified: Secondary | ICD-10-CM | POA: Diagnosis not present

## 2021-04-03 DIAGNOSIS — M542 Cervicalgia: Secondary | ICD-10-CM | POA: Diagnosis not present

## 2021-04-03 DIAGNOSIS — G540 Brachial plexus disorders: Secondary | ICD-10-CM | POA: Diagnosis not present

## 2021-04-03 DIAGNOSIS — M545 Low back pain, unspecified: Secondary | ICD-10-CM | POA: Diagnosis not present

## 2021-04-04 DIAGNOSIS — R0602 Shortness of breath: Secondary | ICD-10-CM | POA: Insufficient documentation

## 2021-04-04 DIAGNOSIS — G4733 Obstructive sleep apnea (adult) (pediatric): Secondary | ICD-10-CM | POA: Insufficient documentation

## 2021-04-04 DIAGNOSIS — Z9989 Dependence on other enabling machines and devices: Secondary | ICD-10-CM | POA: Insufficient documentation

## 2021-04-06 ENCOUNTER — Ambulatory Visit (INDEPENDENT_AMBULATORY_CARE_PROVIDER_SITE_OTHER): Payer: BC Managed Care – PPO | Admitting: Pulmonary Disease

## 2021-04-06 ENCOUNTER — Other Ambulatory Visit: Payer: Self-pay

## 2021-04-06 DIAGNOSIS — H2513 Age-related nuclear cataract, bilateral: Secondary | ICD-10-CM | POA: Diagnosis not present

## 2021-04-06 DIAGNOSIS — R0602 Shortness of breath: Secondary | ICD-10-CM | POA: Diagnosis not present

## 2021-04-06 DIAGNOSIS — H5213 Myopia, bilateral: Secondary | ICD-10-CM | POA: Diagnosis not present

## 2021-04-06 DIAGNOSIS — H18593 Other hereditary corneal dystrophies, bilateral: Secondary | ICD-10-CM | POA: Diagnosis not present

## 2021-04-06 LAB — PULMONARY FUNCTION TEST
DL/VA % pred: 141 %
DL/VA: 5.86 ml/min/mmHg/L
DLCO cor % pred: 103 %
DLCO cor: 21.9 ml/min/mmHg
DLCO unc % pred: 103 %
DLCO unc: 21.9 ml/min/mmHg
FEF 25-75 Post: 2.83 L/sec
FEF 25-75 Pre: 2.86 L/sec
FEF2575-%Change-Post: -1 %
FEF2575-%Pred-Post: 120 %
FEF2575-%Pred-Pre: 121 %
FEV1-%Change-Post: 1 %
FEV1-%Pred-Post: 82 %
FEV1-%Pred-Pre: 80 %
FEV1-Post: 2.17 L
FEV1-Pre: 2.14 L
FEV1FVC-%Change-Post: 0 %
FEV1FVC-%Pred-Pre: 113 %
FEV6-%Change-Post: 0 %
FEV6-%Pred-Post: 73 %
FEV6-%Pred-Pre: 73 %
FEV6-Post: 2.44 L
FEV6-Pre: 2.43 L
FEV6FVC-%Pred-Post: 103 %
FEV6FVC-%Pred-Pre: 103 %
FVC-%Change-Post: 2 %
FVC-%Pred-Post: 72 %
FVC-%Pred-Pre: 70 %
FVC-Post: 2.49 L
FVC-Pre: 2.43 L
Post FEV1/FVC ratio: 87 %
Post FEV6/FVC ratio: 100 %
Pre FEV1/FVC ratio: 88 %
Pre FEV6/FVC Ratio: 100 %
RV % pred: 121 %
RV: 2.55 L
TLC % pred: 95 %
TLC: 5.04 L

## 2021-04-06 NOTE — Patient Instructions (Signed)
Full PFT performed today. °

## 2021-04-06 NOTE — Progress Notes (Signed)
Full PFT performed today. °

## 2021-04-07 ENCOUNTER — Telehealth: Payer: Self-pay | Admitting: Pulmonary Disease

## 2021-04-07 DIAGNOSIS — G4733 Obstructive sleep apnea (adult) (pediatric): Secondary | ICD-10-CM

## 2021-04-07 NOTE — Telephone Encounter (Signed)
Please reach out to patient for information for date and location of her last sleep study. If her last sleep study is >12 months ago, we will need to schedule for repeat study to see if she is a candidate for [facility]. If she is willing to undergo testing, please order home sleep study.  I have attempted to call today and left detailed voicemail. Ok to try again next week.

## 2021-04-11 NOTE — Telephone Encounter (Signed)
Patient states sleep Doctor is Benjaman Kindler. Deboraha Sprang is going to fax sleep study to our office. Patient phone number is [phone]. Will be available after 11:00 am.

## 2021-04-11 NOTE — Telephone Encounter (Signed)
If she wishes to be considered for [facility] she will need a sleep study in the last 12 months with results demonstrating an AHI 15 and 65 events per hour. She would need to be off Ambien if she can. Ok to order home sleep study if patient agrees to this.  For the PFTs results are negative for significant obstructive or restrictive defect. Flow-volume curves suggest small airway disease. We can discuss further at her next visit. If her dyspnea remains persistent, we can also consider CT imaging for complete work-up.

## 2021-04-11 NOTE — Telephone Encounter (Signed)
Called and spoke with patient, she states it has been 2-3 years since she had her sleep study.  Nicole Oneill is going to fax over the results to [place].  Do you want [place] to order a HST? She is asking if when she does the HST should she not take the Ambien that she takes to help her sleep?  A rep from [facility] told her she should not take it during the sleep test.  She is also wanting to know the results of her PFT from 04/06/21.  Please advise.  Thank you.

## 2021-04-12 NOTE — Telephone Encounter (Signed)
Called and spoke with patient. She verbalized understanding. She did mention that she is not able to sleep without the Ambien. She wanted to know if she could take 1/2 of her dose to help her sleep.

## 2021-04-14 DIAGNOSIS — G4733 Obstructive sleep apnea (adult) (pediatric): Secondary | ICD-10-CM

## 2021-04-14 DIAGNOSIS — Z9989 Dependence on other enabling machines and devices: Secondary | ICD-10-CM

## 2021-04-14 DIAGNOSIS — R0602 Shortness of breath: Secondary | ICD-10-CM

## 2021-04-14 NOTE — Telephone Encounter (Signed)
Called and left message with patient who was identified on message VM. Recommended against ambien for sleep study however if needed, she can take, understanding this can interfere with accurate results.  Please contact patient next week regarding ordering home sleep study.

## 2021-04-17 ENCOUNTER — Telehealth: Payer: Self-pay | Admitting: Cardiovascular Disease

## 2021-04-17 NOTE — Telephone Encounter (Signed)
STAT if patient feels like he/she is going to faint   Are you dizzy now? Yes   Do you feel faint or have you passed out? No   Do you have any other symptoms? Nausea , Sweaty and Claimy Hands are cold and claimy.  She stated she feel better if she lays down.  She stated this has been going on the pas week and gotten worse over the weekend   Have you checked your HR and BP (record if available)?  135/80 on Saturday  70 Hr    [phone]

## 2021-04-17 NOTE — Telephone Encounter (Signed)
Called pt she states "I'm dizzy but I do not feel like I'm going to fall but I just don't feel right. Slight headache, nausea, cold and clammy hands." It got worst Saturday, Sunday I felt better all day. Today I felt dizzy and when I went to my pilates class I felt the same as I did Saturday. I do not think it is a virus.   6 months ago started Cymbalta, weaned off, but my pcp states my symptoms are not related to coming off of Cymbalta.  Over the last 6 months I've been SOB.Echo will be Tuesday. Pulmonologist did PFTs  Pt wants to know what Dr. Tresa Endo thinks is going on.

## 2021-04-17 NOTE — Telephone Encounter (Signed)
JE please advise. Thanks   [name] "[name]"  P [facility] Dr. Everardo All,  Got your msg.  I take 5 mg of ambien at bedtime.  Know you said it might interfere w/ sleep studyresults.  I can try melatonin and one half of the 5 mg ambien. or no ambien at all.  When  I did the at-home sleep study w/[facility], they had me  take all the medications at bedtime that I normally take.  I will do whatever you suggest-just not sure if I'll sleep at all w/o the [place], but am willing to try.  Just let me know.  Thanks!   Sharyn Lull

## 2021-04-17 NOTE — Telephone Encounter (Signed)
Patient apparently had recently seen [facility] and is to undergo an echo Doppler study and has follow-up appointment with [facility] in the next several weeks.  We will need to await the results of the echo as well as PFTs.  Should check blood pressures and particularly make certain she is not orthostatic.

## 2021-04-18 NOTE — Telephone Encounter (Signed)
I have contacted patient and we discussed sleep study options. Since patient has not had a prior in-lab study, recommend this to rule out central sleep apnea. Ok to take home meds including ambien during this study.   Staff, please schedule for split night study for OSA, shortness of breath. 

## 2021-04-18 NOTE — Telephone Encounter (Signed)
Called patient, she states that she does feel it has to do with the Cymbalta medication and coming off of that too quickly. She did change how she took it and it has made her feel better today.  She will get the ECHO completed like suggested and the PFT testing.   Patient will call back with any issues or concerns.

## 2021-04-18 NOTE — Telephone Encounter (Signed)
Order for split night has been placed.

## 2021-04-18 NOTE — Telephone Encounter (Signed)
I have contacted patient and we discussed sleep study options. Since patient has not had a prior in-lab study, recommend this to rule out central sleep apnea. Ok to take home meds including ambien during this study.   Staff, please schedule for split night study for OSA, shortness of breath.

## 2021-04-23 NOTE — Progress Notes (Signed)
Cardiology Office Note:    Date:  05/04/2021   ID:  Nicole Oneill, DOB 09-Dec-1960, MRN [id number]  PCP:  [name], MD  Cardiologist:  [name], MD  Electrophysiologist:  None   Referring MD: [name], MD   Chief Complaint: follow-up of shortness of breath  History of Present Illness:    Nicole Oneill is a 60 y.o. female with a history of mild non-obstructive CAD on cardiac catheterization on coronary CTA in 05/2019, chronic dyspnea on exertion, short runs of atrial tachycardia noted on monitor in 2007, hypertension, hyperlipidemia, mild obstructive sleep apnea, Ehlers-Danlos syndrome type III with significant associated orthopedic issues, fibromyalgia, and Sjogren syndrome who is followed by Dr. Tresa Endo and presents today for follow-up of shortness of breath.   Patient was initially referred to Dr. Tresa Endo in 2007 for evaluation of palpitations and vague chest pain. Echo around that time showed normal LV size and function, mild mitral annular calcification, mild MR, and normal aortic root. Myoview showed no evidence of ischemia. Cardiac monitor showed occasional episodes of sinus tachycardia as well as episodes of PACs and short bursts of atrial tachycardia without ventricular ectopy. Patient was not seen again until 2015 for reevaluation after being diagnosed with Ehler-Danlos syndrome, hypermobility type III. At that time she was doing well from a cardiac standpoint. Repeat Echo was ordered and showed LVEF of 60-65% with grade 2 diastolic dysfunction and no significant valvular disease. The thoracic aorta was not dilated. She was not seen again until an admission in 05/2019 with chest pain. Echo at that time showed LVEF of 65-70% with no significant valvular disease and no evidence of dilatation or obstruction of the aortic root, ascending aorta, and aortic arch. Patient underwent coronary CTA which showed a coronary calcium score of 38 (81% percentile for age and sex) and minimal  non-obstructive (0-24%) disease.    Patient called our office on 03/15/2021 with reports of worsening shortness of breath. She was reportedly seen by her PCP for this and advised to be seen by Cardiology and Pulmonology. She was seen by Dr. Everardo All (Pulmonology) on 03/21/2021 and PFTs were ordered. 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 Chest x-ray showed no acute findings and quantiferon TB study was negative. Patient was then seen by me on 03/24/2021 at which time she reported worsening shortness of breath with mild exertion over the past 6-12 months. BNP at that visit was normal. Patient was scheduled to have PFTs at the end of the month. Echo was ordered with plan to follow-up after PFTs and Echo have been completed.  PFTs on 04/06/2021 were negative for obstructive or restrictive defect. Echo on 04/25/2021 showed LVEF of 60-65% with normal wall motion.   Patient presents today for follow-up.  Here alone.  Patient states she had an appointment with Pulmonology earlier this morning and was told she had "small airways" and did not fully exhale.  However, no official diagnosis of COPD.  She was prescribed an albuterol inhaler to take as needed.  She denies any changes in her shortness of breath.  She continues to have shortness of breath with relatively mild exertion such as walking up a flight of steps.  No shortness of breath at rest.  No orthopnea, PND, lower extremity edema.  No chest pain.  She notes occasional palpitations that she describes as a "flutter" -sounds like premature beats.  She also notes some dizziness but states this has improved as she  has been weaning off Cymbalta.  No syncope.   Past Medical History:  Diagnosis Date   Anemia    Arthritis    osteo   Atrial tachycardia (HCC)    x 10 years ago wias diagnosed with short runs of atrial tachycardia while wearing a heart monitor.  No problems since.   CAD (coronary artery disease)    noted on  coronary CTA in 05/2019   Chronic dyspnea    Complication of anesthesia    urinary retention requiring indwelling  foley catheterization   DDD (degenerative disc disease)    Ehlers-Danlos syndrome type III    Fibromyalgia    History of blood transfusion    Hyperlipidemia    Hypertension    Osteoarthritis    Sleep apnea, obstructive    Thoracic outlet syndrome 07/2020   per patient     Past Surgical History:  Procedure Laterality Date   BACK SURGERY     Cervical fusion C5-7, fusion  T10-S1   DIAGNOSTIC LAPAROSCOPY     x 3   EYE SURGERY     bilateral tear duct fusion   JOINT REPLACEMENT     partial left knee 2005, total left knee 2012   KNEE ARTHROPLASTY     KNEE ARTHROSCOPY  08/06/2012   Procedure: ARTHROSCOPY KNEE;  Surgeon: Loanne Drilling, MD;  Location: [facility];  Service: Orthopedics;  Laterality: Left;  WITH SYNOVECTOMY and debridement   OSTEOTOMY     bilateral tibial    Current Medications: Current Meds  Medication Sig   albuterol (VENTOLIN HFA) 108 (90 Base) MCG/ACT inhaler Inhale 2 puffs into the lungs every 6 (six) hours as needed for wheezing or shortness of breath.   amLODipine (NORVASC) 2.5 MG tablet TAKE 1 TABLET BY MOUTH EVERY DAY   atorvastatin (LIPITOR) 10 MG tablet TAKE 1 TABLET BY MOUTH EVERY DAY   Calcium Carbonate-Vitamin D (CALTRATE 600+D PO) Take 1 tablet by mouth daily.    Cholecalciferol (VITAMIN D3) 2000 units capsule Take 2,000 Units by mouth daily.   dextroamphetamine (DEXTROSTAT) 5 MG tablet Take 5 mg by mouth as needed.   estradiol (ESTRACE) 0.1 MG/GM vaginal cream SMARTSIG:0.5 Applicator Vaginal 3 Times a Week   gabapentin (NEURONTIN) 100 MG capsule Take 100-200 mg by mouth 2 (two) times daily.   gabapentin (NEURONTIN) 300 MG capsule Take 1 tab in AM, 1 tab midday, and 2 tabs at bedtime for nerve pain   meloxicam (MOBIC) 15 MG tablet Take 1 tablet (15 mg total) by mouth daily as needed.   methocarbamol (ROBAXIN) 750 MG tablet TAKE 1 TABLET (750  MG TOTAL) BY MOUTH AT BEDTIME.   polyethylene glycol powder (GLYCOLAX/MIRALAX) 17 GM/SCOOP powder Take 1 Container by mouth once.   Probiotic Product (PROBIOTIC PO) Take 1 capsule by mouth daily.   zolpidem (AMBIEN) 5 MG tablet TAKE 1 TABLET (5 MG TOTAL) BY MOUTH AT BEDTIME AS NEEDED. FOR SLEEP     Allergies:   Patient has no known allergies.   Social History   Socioeconomic History   Marital status: Married    Spouse name: [name]   Number of children: 3   Years of education: BA   Highest education level: Not on file  Occupational History    Employer: [employer]    Comment: [employer]   Tobacco Use   Smoking status: Never   Smokeless tobacco: Never  Vaping Use   Vaping Use: Never used  Substance and Sexual Activity   Alcohol  use: Yes    Comment: 3 glasses of wine weekly   Drug use: No   Sexual activity: Not on file  Other Topics Concern   Not on file  Social History Narrative   Patient lives at home with family.   Caffeine Use: 4-5 sodas weekly   Social Determinants of Health   Financial Resource Strain: Not on file  Food Insecurity: Not on file  Transportation Needs: Not on file  Physical Activity: Not on file  Stress: Not on file  Social Connections: Not on file     Family History: The patient's family history includes Anorexia nervosa in her sister; Atrial fibrillation in her father; Cancer - Colon in her paternal grandfather and paternal grandmother; Cancer - Prostate in her father; Congestive Heart Failure in her father; Healthy in her daughter, daughter, and daughter; Heart attack in her maternal grandmother; Hypertension in her father; Stroke in her maternal grandfather and mother.  ROS:   Please see the history of present illness.     EKGs/Labs/Other Studies Reviewed:    The following studies were reviewed today:  Coronary CTA 05/19/2019: Impressions: 1. Coronary calcium score of 38. This was 4 percentile for age and sex matched  control. 2. Normal coronary origin with right dominance. 3. CAD-RADS 1. Minimal non-obstructive CAD (0-24%). Consider non-atherosclerotic causes of chest pain. Consider preventive therapy and risk factor modification. _______________  Echocardiogram 04/25/2021: Impressions:  1. Left ventricular ejection fraction, by estimation, is 60 to 65%. The  left ventricle has normal function. The left ventricle has no regional  wall motion abnormalities. Left ventricular diastolic parameters were  normal.   2. Right ventricular systolic function is normal. The right ventricular  size is normal. Tricuspid regurgitation signal is inadequate for assessing  PA pressure.   3. The mitral valve is grossly normal. Trivial mitral valve  regurgitation. No evidence of mitral stenosis.   4. The aortic valve is tricuspid. Aortic valve regurgitation is not  visualized. No aortic stenosis is present.   5. The inferior vena cava is normal in size with greater than 50%  respiratory variability, suggesting right atrial pressure of 3 mmHg.   Comparison(s): No significant change from prior study.   Conclusion(s)/Recommendation(s): Normal biventricular function without  evidence of hemodynamically significant valvular heart disease.   EKG:  EKG not ordered today.  Recent Labs: 02/02/2021: ALT 38; Hemoglobin 12.9; Platelets 210 03/24/2021: BUN 18; Creatinine, Ser 0.90; NT-Pro BNP 67; Potassium 3.8; Sodium 142  Recent Lipid Panel    Component Value Date/Time   CHOL 168 03/07/2020 0806   TRIG 70 03/07/2020 0806   HDL 92 03/07/2020 0806   CHOLHDL 1.8 03/07/2020 0806   CHOLHDL 2.8 05/19/2019 0504   VLDL 10 05/19/2019 0504   LDLCALC 63 03/07/2020 0806    Physical Exam:    Vital Signs: BP 117/61   Pulse 83   Ht 5\' 4"  (1.626 m)   Wt 134 lb 3.2 oz (60.9 kg)   SpO2 98%   BMI 23.04 kg/m     Wt Readings from Last 3 Encounters:  05/04/21 134 lb 3.2 oz (60.9 kg)  05/04/21 133 lb (60.3 kg)  04/25/21 130 lb  (59 kg)     General: 60 y.o. Caucasian female in no acute distress. HEENT: Normocephalic and atraumatic. Sclera clear.  Neck: Supple. No carotid bruits. No JVD. Heart: RRR. Distinct S1 and S2. No murmurs, gallops, or rubs. Radial pulses 2+ and equal bilaterally. Lungs: No increased work of breathing. Clear to ausculation  bilaterally. No wheezes, rhonchi, or rales.  Abdomen: Soft, non-distended, and non-tender to palpation.  Extremities: No lower extremity edema.    Skin: Warm and dry. Neuro: Alert and oriented x3. No focal deficits. Psych: Normal affect. Responds appropriately.  Assessment:    1. Shortness of breath   2. Non-obstructive CAD   3. Primary hypertension   4. Hyperlipidemia, unspecified hyperlipidemia type   5. Obstructive sleep apnea   6. Ehlers-Danlos syndrome     Plan:    Shortness of Breath - Patient has had chronic shortness of breath for years but it has been getting progressively worse over the last 6-12 months.  - Work-up has been unremarkable so far. Chest x-ray and BNP normal. Echo showed normal LV function, normal diastolic function, and no significant valvular disease. Coronary CTA in 05/2019 showed only minimal CAD. Pulmonology ordered PFTs which were negative for obstructive and restrictive defects. She saw Pulmonology earlier this morning and was told she had "small airways" and did not full exhale but no official diagnosis of COPD. She was prescribed an albuterol inhaler to take as needed. Pulmonology has also ordered sleep study which is pending. - Will wait to see if she has any improvement with new inhaler. If no improvement, may need to consider repeat ischemic evaluation or CPX.  Minimal Non-Obstructive CAD - Noted on coronary CTA in 05/2019.  - No chest pain. - Has not been on Aspirin therapy given only minimal disease.   Hypertension - BP well controlled. - Continue Amlodipine 2.5mg  daily.    Hyperlipidemia - Last lipid panel in 02/2020:  Total Cholesterol 168, Triglycerides 70, HDL 92, LDL 63.  - Continue Lipitor 10mg  daily.  - Patient states she had labs done last week at her PCP office. Will request a copy of these labs.   Obstructive Sleep Apnea - She has not been using her CPAP machine due to a problem with the mask.  - Pulmonology has ordered repeat sleep study.   Ehlers-Danlos Syndrome Type III - CTA in 05/2019 showed no evidence of aortic aneurysm/dissection - Recent Echo showed normal aortic root and ascending aorta with no evidence of dilitation.  Disposition: Follow up in 6 months.   Medication Adjustments/Labs and Tests Ordered: Current medicines are reviewed at length with the patient today.  Concerns regarding medicines are outlined above.  No orders of the defined types were placed in this encounter.  No orders of the defined types were placed in this encounter.   Patient Instructions  Medication Instructions:  The current medical regimen is effective;  continue present plan and medications as directed. Please refer to the Current Medication list given to you today.   *If you need a refill on your cardiac medications before your next appointment, please call your pharmacy*  Lab Work:   Testing/Procedures:  NONE    NONE   Follow-Up: Your next appointment:  6 month(s) In Person with 06/2019, MD or [name], PA-C    Please call our office 2 months in advance to schedule this appointment   At [facility], you and your health needs are our priority.  As part of our continuing mission to provide you with exceptional heart care, we have created designated Provider Care Teams.  These Care Teams include your primary Cardiologist (physician) and Advanced Practice Providers (APPs -  Physician Assistants and Nurse Practitioners) who all work together to provide you with the care you need, when you need it.  Signed, Corrin Parker, PA-C  05/04/2021 2:45 PM    [facility]

## 2021-04-25 ENCOUNTER — Telehealth (INDEPENDENT_AMBULATORY_CARE_PROVIDER_SITE_OTHER): Payer: BC Managed Care – PPO | Admitting: Sports Medicine

## 2021-04-25 ENCOUNTER — Ambulatory Visit (HOSPITAL_COMMUNITY): Payer: BC Managed Care – PPO | Attending: Cardiovascular Disease

## 2021-04-25 ENCOUNTER — Other Ambulatory Visit: Payer: Self-pay

## 2021-04-25 DIAGNOSIS — Q7962 Hypermobile Ehlers-Danlos syndrome: Secondary | ICD-10-CM | POA: Diagnosis not present

## 2021-04-25 DIAGNOSIS — G4733 Obstructive sleep apnea (adult) (pediatric): Secondary | ICD-10-CM | POA: Diagnosis not present

## 2021-04-25 DIAGNOSIS — I10 Essential (primary) hypertension: Secondary | ICD-10-CM | POA: Insufficient documentation

## 2021-04-25 DIAGNOSIS — E785 Hyperlipidemia, unspecified: Secondary | ICD-10-CM | POA: Diagnosis not present

## 2021-04-25 DIAGNOSIS — I2510 Atherosclerotic heart disease of native coronary artery without angina pectoris: Secondary | ICD-10-CM | POA: Insufficient documentation

## 2021-04-25 DIAGNOSIS — R0602 Shortness of breath: Secondary | ICD-10-CM | POA: Insufficient documentation

## 2021-04-25 LAB — ECHOCARDIOGRAM COMPLETE
Area-P 1/2: 3.77 cm2
Height: 65.5 in
S' Lateral: 2.7 cm
Weight: 2080 oz

## 2021-04-25 NOTE — Assessment & Plan Note (Signed)
I believe her generalized pain pattern may relate to her previous orthopedic issues but could very well be a part of her Ehlers-Danlos syndrome that has led to the orthopedic issues. I asked her to complete weaning off of Cymbalta. I will see her in the office when this is done and consider changing the dose of both gabapentin but also adding a progressive dose of naltrexone. Of interest she has a daughter with Ehlers-Danlos who is started on this treatment and is doing very well. Clinic visit in approximately 3 weeks

## 2021-04-25 NOTE — Progress Notes (Signed)
Patient agrees to a televisit today. She was contacted by telephone in her home. I spoke to her from the [facility] office. Video connection failed. Time of the call was 15 minutes.  Patient has Ehlers-Danlos syndrome and has had multiple orthopedic surgeries over many years She has most recently been treated by Dr. Berline Chough with Cymbalta and this helped a lot for about 6 months. However she has had increasing abdominal symptoms and bloating on the Cymbalta and is now trying to wean off this medication. She also uses meloxicam for pain at times about 1/2 tablet. She continues on gabapentin and her current dose is 200 mg in the morning and 600 at bedtime. The gabapentin clearly helps her symptoms and she wonders if she could take a larger dose of that as well.  Patient continues to try to stay active.  She is participating in a Pilates class. Currently patient has pain that is intermittent but it affects both upper extremities. She has some pain around her hips pelvis and this sometimes radiates into her lower extremities.  She has been considered to have some of the chronic fatigue and myalgia symptoms related to Ehlers-Danlos. She inquires about the need to treatment protocol for naltrexone.

## 2021-04-29 DIAGNOSIS — Z23 Encounter for immunization: Secondary | ICD-10-CM | POA: Diagnosis not present

## 2021-05-03 ENCOUNTER — Other Ambulatory Visit: Payer: Self-pay | Admitting: Rheumatology

## 2021-05-03 DIAGNOSIS — E785 Hyperlipidemia, unspecified: Secondary | ICD-10-CM | POA: Diagnosis not present

## 2021-05-03 DIAGNOSIS — M859 Disorder of bone density and structure, unspecified: Secondary | ICD-10-CM | POA: Diagnosis not present

## 2021-05-03 NOTE — Telephone Encounter (Signed)
Next Visit: 08/03/2021   Last Visit: 02/02/2021   Last Fill:02/02/2021  Dx: Primary insomnia   Current Dose per office note on 02/02/2021: Ambien 5 mg 1 tablet by mouth at bedtime as needed for insomnia  Okay to refill Ambien?

## 2021-05-04 ENCOUNTER — Other Ambulatory Visit: Payer: Self-pay

## 2021-05-04 ENCOUNTER — Ambulatory Visit (INDEPENDENT_AMBULATORY_CARE_PROVIDER_SITE_OTHER): Payer: BC Managed Care – PPO | Admitting: Student

## 2021-05-04 ENCOUNTER — Encounter: Payer: Self-pay | Admitting: Pulmonary Disease

## 2021-05-04 ENCOUNTER — Encounter: Payer: Self-pay | Admitting: Student

## 2021-05-04 ENCOUNTER — Ambulatory Visit (INDEPENDENT_AMBULATORY_CARE_PROVIDER_SITE_OTHER): Payer: BC Managed Care – PPO | Admitting: Pulmonary Disease

## 2021-05-04 VITALS — BP 118/86 | HR 71 | Ht 64.57 in | Wt 133.0 lb

## 2021-05-04 VITALS — BP 117/61 | HR 83 | Ht 64.0 in | Wt 134.2 lb

## 2021-05-04 DIAGNOSIS — I10 Essential (primary) hypertension: Secondary | ICD-10-CM | POA: Diagnosis not present

## 2021-05-04 DIAGNOSIS — R0602 Shortness of breath: Secondary | ICD-10-CM | POA: Diagnosis not present

## 2021-05-04 DIAGNOSIS — Z9989 Dependence on other enabling machines and devices: Secondary | ICD-10-CM

## 2021-05-04 DIAGNOSIS — Q796 Ehlers-Danlos syndrome, unspecified: Secondary | ICD-10-CM

## 2021-05-04 DIAGNOSIS — G4733 Obstructive sleep apnea (adult) (pediatric): Secondary | ICD-10-CM

## 2021-05-04 DIAGNOSIS — E785 Hyperlipidemia, unspecified: Secondary | ICD-10-CM | POA: Insufficient documentation

## 2021-05-04 DIAGNOSIS — I2510 Atherosclerotic heart disease of native coronary artery without angina pectoris: Secondary | ICD-10-CM | POA: Insufficient documentation

## 2021-05-04 MED ORDER — ALBUTEROL SULFATE HFA 108 (90 BASE) MCG/ACT IN AERS: 2.0000 | INHALATION_SPRAY | Freq: Four times a day (QID) | RESPIRATORY_TRACT | 6 refills | Status: DC | PRN

## 2021-05-04 NOTE — Patient Instructions (Signed)
Medication Instructions:  The current medical regimen is effective;  continue present plan and medications as directed. Please refer to the Current Medication list given to you today.   *If you need a refill on your cardiac medications before your next appointment, please call your pharmacy*  Lab Work:   Testing/Procedures:  NONE    NONE   Follow-Up: Your next appointment:  6 month(s) In Person with [name], MD or Marjie Skiff, PA-C    Please call our office 2 months in advance to schedule this appointment   At [facility], you and your health needs are our priority.  As part of our continuing mission to provide you with exceptional heart care, we have created designated Provider Care Teams.  These Care Teams include your primary Cardiologist (physician) and Advanced Practice Providers (APPs -  Physician Assistants and Nurse Practitioners) who all work together to provide you with the care you need, when you need it.

## 2021-05-04 NOTE — Progress Notes (Signed)
Subjective:   PATIENT ID: Nicole Oneill GENDER: female DOB: 24-Nov-1960, MRN: [id number]   HPI  Chief Complaint  Patient presents with   Follow-up    Review PFT results    Reason for Visit: Follow-up shortness of breath  [name] is a 60 year old female with Ehlers-Danlos, hx OSA on CPAP in 2019, multiple back surgeries who present for follow-up.  Initial consult: Referred by her PCP, Dr. Waynard Edwards. She reports that she has had long standing shortness of breath that has worsened in the last six months. She is unable to associate shortness of breath with walking upstairs and with exertion and eating. Difficulty taking a deep breath. Denies coughing or wheezing. She feels she is not able to keep up with others.  She is active and able to walk a steady pace 30 minutes daily with her dog and pilates 2 days a week.  She reports childhood bronchiolitis on CXR during elementary school. No inhalers but treated with home remedies.   She has mild OSA (self-reports 9 episodes per hour) thought related to her Ehlers-Danlos. She does not consistently wear her CPAP with various masks including full and nasal pillows.   05/04/21 Since our last visit, she continues to have unchanged shortness of breath. Worsens with certain activities such as stair stepping but reports being ok during pilates twice a week. No coughing or wheezing.She has not worn her CPAP consistently. Has been taking adderall for her fatigue.  Social History: Never smoker   Past Medical History:  Diagnosis Date   Anemia    Arthritis    osteo   Atrial tachycardia (HCC)    x 10 years ago wias diagnosed with short runs of atrial tachycardia while wearing a heart monitor.  No problems since.   Complication of anesthesia    urinary retention requiring indwelling  foley catheterization   DDD (degenerative disc disease)    Ehlers-Danlos syndrome type III    Fibromyalgia    History of blood transfusion    Osteoarthritis     Thoracic outlet syndrome 07/2020   per patient      Family History  Problem Relation Age of Onset   Stroke Mother        hemorrhagic   Hypertension Father    Cancer - Prostate Father    Atrial fibrillation Father    Congestive Heart Failure Father    Anorexia nervosa Sister    Heart attack Maternal Grandmother    Stroke Maternal Grandfather    Cancer - Colon Paternal Grandmother    Cancer - Colon Paternal Grandfather    Healthy Daughter    Healthy Daughter    Healthy Daughter      Social History   Occupational History    Employer: [employer]    Comment: [employer]   Tobacco Use   Smoking status: Never   Smokeless tobacco: Never  Vaping Use   Vaping Use: Never used  Substance and Sexual Activity   Alcohol use: Yes    Comment: 3 glasses of wine weekly   Drug use: No   Sexual activity: Not on file    No Known Allergies   Outpatient Medications Prior to Visit  Medication Sig Dispense Refill   amLODipine (NORVASC) 2.5 MG tablet TAKE 1 TABLET BY MOUTH EVERY DAY 90 tablet 7   atorvastatin (LIPITOR) 10 MG tablet TAKE 1 TABLET BY MOUTH EVERY DAY 90 tablet 3   Calcium Carbonate-Vitamin D (CALTRATE 600+D PO) Take 1  tablet by mouth daily.      Cholecalciferol (VITAMIN D3) 2000 units capsule Take 2,000 Units by mouth daily.     dextroamphetamine (DEXTROSTAT) 5 MG tablet Take 5 mg by mouth as needed.     estradiol (ESTRACE) 0.1 MG/GM vaginal cream SMARTSIG:0.5 Applicator Vaginal 3 Times a Week     gabapentin (NEURONTIN) 100 MG capsule Take 100-200 mg by mouth 2 (two) times daily.     gabapentin (NEURONTIN) 300 MG capsule Take 1 tab in AM, 1 tab midday, and 2 tabs at bedtime for nerve pain 120 capsule 1   meloxicam (MOBIC) 15 MG tablet Take 1 tablet (15 mg total) by mouth daily as needed. 30 tablet 2   methocarbamol (ROBAXIN) 750 MG tablet TAKE 1 TABLET (750 MG TOTAL) BY MOUTH AT BEDTIME. 30 tablet 2   Probiotic Product (PROBIOTIC PO) Take 1 capsule by mouth daily.      zolpidem (AMBIEN) 5 MG tablet TAKE 1 TABLET (5 MG TOTAL) BY MOUTH AT BEDTIME AS NEEDED. FOR SLEEP 30 tablet 2   DULoxetine (CYMBALTA) 60 MG capsule Take 60 mg by mouth daily. (Patient not taking: Reported on 05/04/2021)     polyethylene glycol powder (GLYCOLAX/MIRALAX) 17 GM/SCOOP powder Take 1 Container by mouth once. (Patient not taking: Reported on 05/04/2021)     No facility-administered medications prior to visit.    Review of Systems  Constitutional:  Positive for malaise/fatigue. Negative for chills, diaphoresis, fever and weight loss.  HENT:  Negative for congestion.   Respiratory:  Positive for shortness of breath. Negative for cough, hemoptysis, sputum production and wheezing.   Cardiovascular:  Negative for chest pain, palpitations and leg swelling.    Objective:   Vitals:   05/04/21 0954  BP: 118/86  Pulse: 71  SpO2: 100%  Weight: 133 lb (60.3 kg)  Height: 5' 4.57" (1.64 m)  SpO2: 100 % O2 Device: None (Room air)  Physical Exam: General: Well-appearing, no acute distress HENT: [place], AT Eyes: EOMI, no scleral icterus Respiratory: Clear to auscultation bilaterally.  No crackles, wheezing or rales Cardiovascular: RRR, -M/R/G, no JVD Extremities:-Edema,-tenderness Neuro: AAO x4, CNII-XII grossly intact Psych: Normal mood, normal affect  Data Reviewed:  Imaging: CXR 03/21/21 - No infiltrate, effusion or edema.  PFT: 04/06/21 FVC 2.49 (72%) FEV1 2.17 (85%) Ratio 88  TLC 95% DLCO 103% Interpretation: Normal spirometry. No significant bronchodilator response.  Labs: 06/14/20 Eos 0.0    Assessment & Plan:   Discussion: 60 year old female with Ehlers-Danlos, hx OSA on CPAP in 2019, multiple back surgeries who presents for follow-up for shortness of breath. Likely multifactorial with non-adherence to CPAP and deconditioning. PFTs reviewed and reassuring with no significant obstructive or restrictive lung disease. 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 Quantiferon neg and chest imaging negative.  Shortness of breath --START Albuterol as needed for shortness of breath or wheezing. Ok to use prior to activity and 30-60 minutes into activity. --Encourage regular daily exercise up to five days a week  OSA on CPAP --Scheduled for in-lab sleep study in November --Counseled on compliance with CPAP for >4 hours --Do not drive when sleepy  Health Maintenance Immunization History  Administered Date(s) Administered   Influenza Inj Mdck Quad Pf 04/22/2017   Influenza,inj,quad, With Preservative 04/08/2017   Moderna Sars-Covid-2 Vaccination 10/11/2019, 11/06/2019, 10/08/2020   PFIZER(Purple Top)SARS-COV-2 Vaccination 06/09/2020   Pfizer Covid-19 Vaccine Bivalent Booster 53yrs & up 03/22/2021   CT Lung Screen - never smoker  No orders of the defined types were placed in this encounter. Meds ordered this encounter  Medications   albuterol (VENTOLIN HFA) 108 (90 Base) MCG/ACT inhaler    Sig: Inhale 2 puffs into the lungs every 6 (six) hours as needed for wheezing or shortness of breath.    Dispense:  8 g    Refill:  6   Return in about 4 months (around 09/04/2021).   I have spent a total time of 32-minutes on the day of the appointment reviewing prior documentation, coordinating care and discussing medical diagnosis and plan with the patient/family. Past medical history, allergies, medications were reviewed. Pertinent imaging, labs and tests included in this note have been reviewed and interpreted independently by me.  Zaelyn Noack Mechele Collin, MD [facility] 05/04/2021 10:00 AM  Office Number [phone]

## 2021-05-04 NOTE — Patient Instructions (Addendum)
Shortness of breath --START Albuterol as needed for shortness of breath or wheezing. Ok to use prior to activity and 30-60 minutes into activity. --Encourage regular daily exercise up to five days a week   OSA on CPAP --Scheduled for in-lab sleep study on November 28 --Counseled on compliance with CPAP for >4 hours once supplies are in --Do not drive when sleepy  Follow-up with me in Feb 2023

## 2021-05-08 ENCOUNTER — Encounter: Payer: Self-pay | Admitting: Pulmonary Disease

## 2021-05-09 DIAGNOSIS — M5441 Lumbago with sciatica, right side: Secondary | ICD-10-CM | POA: Diagnosis not present

## 2021-05-09 DIAGNOSIS — R069 Unspecified abnormalities of breathing: Secondary | ICD-10-CM | POA: Diagnosis not present

## 2021-05-09 DIAGNOSIS — R531 Weakness: Secondary | ICD-10-CM | POA: Diagnosis not present

## 2021-05-09 DIAGNOSIS — M25551 Pain in right hip: Secondary | ICD-10-CM | POA: Diagnosis not present

## 2021-05-10 NOTE — Telephone Encounter (Signed)
JE please advise on the message from the patient.  thanks

## 2021-05-11 DIAGNOSIS — Z23 Encounter for immunization: Secondary | ICD-10-CM | POA: Diagnosis not present

## 2021-05-11 DIAGNOSIS — I2510 Atherosclerotic heart disease of native coronary artery without angina pectoris: Secondary | ICD-10-CM | POA: Diagnosis not present

## 2021-05-11 DIAGNOSIS — Z0000 Encounter for general adult medical examination without abnormal findings: Secondary | ICD-10-CM | POA: Diagnosis not present

## 2021-05-11 DIAGNOSIS — Z1331 Encounter for screening for depression: Secondary | ICD-10-CM | POA: Diagnosis not present

## 2021-05-11 DIAGNOSIS — Z1339 Encounter for screening examination for other mental health and behavioral disorders: Secondary | ICD-10-CM | POA: Diagnosis not present

## 2021-05-16 ENCOUNTER — Ambulatory Visit (INDEPENDENT_AMBULATORY_CARE_PROVIDER_SITE_OTHER): Payer: BC Managed Care – PPO | Admitting: Sports Medicine

## 2021-05-16 DIAGNOSIS — M79671 Pain in right foot: Secondary | ICD-10-CM

## 2021-05-16 DIAGNOSIS — Q7962 Hypermobile Ehlers-Danlos syndrome: Secondary | ICD-10-CM

## 2021-05-16 MED ORDER — AMBULATORY NON FORMULARY MEDICATION: 0 refills | Status: DC

## 2021-05-16 NOTE — Progress Notes (Signed)
PCP: [name], MD  Subjective:   HPI: Patient is a 60 y.o. female here for EDS.  Nicole Oneill continues to have pain all over, most notably the upper back, R hip, and R foot. Her foot and hand are painful at rest. Pain is in the lateral R hip and is characterized as tenderness.  She does have difficulty sleeping on the right side due to the pain.  She was recently tapered off of Cymbalta, and has been off for about 10 days. She in interested in trying Naltrexone for her pain. She continues to go to pilates 2x/week and walks the dog QID. She does notice that it is harder for abduction on the R side in Pilates. She is also having trouble with doing exercises when pressure is put on her wrists.   Past Medical History:  Diagnosis Date   Anemia    Arthritis    osteo   Atrial tachycardia (HCC)    x 10 years ago wias diagnosed with short runs of atrial tachycardia while wearing a heart monitor.  No problems since.   CAD (coronary artery disease)    noted on coronary CTA in 05/2019   Chronic dyspnea    Complication of anesthesia    urinary retention requiring indwelling  foley catheterization   DDD (degenerative disc disease)    Ehlers-Danlos syndrome type III    Fibromyalgia    History of blood transfusion    Hyperlipidemia    Hypertension    Osteoarthritis    Sleep apnea, obstructive    Thoracic outlet syndrome 07/2020   per patient     Current Outpatient Medications on File Prior to Visit  Medication Sig Dispense Refill   albuterol (VENTOLIN HFA) 108 (90 Base) MCG/ACT inhaler Inhale 2 puffs into the lungs every 6 (six) hours as needed for wheezing or shortness of breath. 8 g 6   amLODipine (NORVASC) 2.5 MG tablet TAKE 1 TABLET BY MOUTH EVERY DAY 90 tablet 7   atorvastatin (LIPITOR) 10 MG tablet TAKE 1 TABLET BY MOUTH EVERY DAY 90 tablet 3   Calcium Carbonate-Vitamin D (CALTRATE 600+D PO) Take 1 tablet by mouth daily.      Cholecalciferol (VITAMIN D3) 2000 units capsule Take 2,000 Units  by mouth daily.     dextroamphetamine (DEXTROSTAT) 5 MG tablet Take 5 mg by mouth as needed.     estradiol (ESTRACE) 0.1 MG/GM vaginal cream SMARTSIG:0.5 Applicator Vaginal 3 Times a Week     gabapentin (NEURONTIN) 100 MG capsule Take 100-200 mg by mouth 2 (two) times daily.     gabapentin (NEURONTIN) 300 MG capsule Take 1 tab in AM, 1 tab midday, and 2 tabs at bedtime for nerve pain 120 capsule 1   meloxicam (MOBIC) 15 MG tablet Take 1 tablet (15 mg total) by mouth daily as needed. 30 tablet 2   methocarbamol (ROBAXIN) 750 MG tablet TAKE 1 TABLET (750 MG TOTAL) BY MOUTH AT BEDTIME. 30 tablet 2   polyethylene glycol powder (GLYCOLAX/MIRALAX) 17 GM/SCOOP powder Take 1 Container by mouth once.     Probiotic Product (PROBIOTIC PO) Take 1 capsule by mouth daily.     zolpidem (AMBIEN) 5 MG tablet TAKE 1 TABLET (5 MG TOTAL) BY MOUTH AT BEDTIME AS NEEDED. FOR SLEEP 30 tablet 2   No current facility-administered medications on file prior to visit.    Past Surgical History:  Procedure Laterality Date   BACK SURGERY     Cervical fusion C5-7, fusion  T10-S1   DIAGNOSTIC  LAPAROSCOPY     x 3   EYE SURGERY     bilateral tear duct fusion   JOINT REPLACEMENT     partial left knee 2005, total left knee 2012   KNEE ARTHROPLASTY     KNEE ARTHROSCOPY  08/06/2012   Procedure: ARTHROSCOPY KNEE;  Surgeon: Loanne Drilling, MD;  Location: [facility];  Service: Orthopedics;  Laterality: Left;  WITH SYNOVECTOMY and debridement   OSTEOTOMY     bilateral tibial    No Known Allergies  BP 116/74   Ht 5' 4.5" (1.638 m)   Wt 130 lb (59 kg)   BMI 21.97 kg/m   [facility] 03/24/2020 04/06/2020 06/21/2020  Frequency of aerobic exercise (# of days/week) 4 4 4   Average time in minutes 30 30 40  Frequency of strengthening activities (# of days/week) 2 2 2     No flowsheet data found.      Objective:  Physical Exam:  Gen: NAD, comfortable in exam room MSK: TTP just posterior to the R  greater tuberosity with tenderness into the gluteus medius. No piriformis tenderness. Functional leg length discrepancy with L>R, R innominate anteriorly rotated. No trendelenburg with standing on R leg.  4/5 strength in right hip resisted abduction with added internal and external rotation  Hip abduction much stronger than in past Assessment & Plan:  1. EDS -Initiate naltrexone protocol starting at 1 mg daily x1 week, then 2 mg daily x1 week, and 3 mg daily x1 week, then 4 mg daily x1 week -Follow-up in 4 weeks to assess any improvement in pain  2.  Gluteus medius syndrome -Continue Pilates as above, as this helps with strengthening of the hip abductors  3.  Functional leg length discrepancy -Left leg longer than right leg by measurement of medial malleoli -Added right-sided heel lift   , DO Family Medicine, PGY-3  I observed and examined the patient with the resident and agree with assessment and plan.  Note reviewed and modified by me. , MD

## 2021-05-16 NOTE — Assessment & Plan Note (Signed)
With multiple areas of chronic pain: Trial of naltrexone therapy Reck this in 1 month  OK to continue pilates

## 2021-05-16 NOTE — Assessment & Plan Note (Signed)
Today this presents like tarsal tunnel syndrome  Plan to add scaphoid pads to take pressure off tibial N Added to current orthotics

## 2021-05-18 DIAGNOSIS — L821 Other seborrheic keratosis: Secondary | ICD-10-CM | POA: Diagnosis not present

## 2021-05-18 DIAGNOSIS — L853 Xerosis cutis: Secondary | ICD-10-CM | POA: Diagnosis not present

## 2021-05-18 DIAGNOSIS — L814 Other melanin hyperpigmentation: Secondary | ICD-10-CM | POA: Diagnosis not present

## 2021-05-18 DIAGNOSIS — D225 Melanocytic nevi of trunk: Secondary | ICD-10-CM | POA: Diagnosis not present

## 2021-05-22 ENCOUNTER — Ambulatory Visit: Payer: BC Managed Care – PPO | Admitting: Cardiovascular Disease

## 2021-05-22 ENCOUNTER — Telehealth: Payer: Self-pay | Admitting: Pulmonary Disease

## 2021-05-22 NOTE — Telephone Encounter (Signed)
I called the sleep center and the next available to schedule for a splint night is 06/30/21. Does the patient need to be re-scheduled with a sleep provider? Please advise.

## 2021-05-22 NOTE — Telephone Encounter (Signed)
The patient is already scheduled for 06/05/21. Don't change her appointment.  The question is : Has the insurance approved to cover the study?  She can keep follow-up with me. No sleep provider needed at this time.

## 2021-05-22 NOTE — Telephone Encounter (Signed)
[name] what is the status on this study's auth

## 2021-05-23 NOTE — Telephone Encounter (Signed)
Auth# [id number] 05/23/21-07/21/21 Tobe Sos

## 2021-05-25 ENCOUNTER — Encounter: Payer: Self-pay | Admitting: Sports Medicine

## 2021-06-04 ENCOUNTER — Other Ambulatory Visit: Payer: Self-pay | Admitting: Rheumatology

## 2021-06-05 ENCOUNTER — Other Ambulatory Visit: Payer: Self-pay

## 2021-06-05 ENCOUNTER — Ambulatory Visit (HOSPITAL_BASED_OUTPATIENT_CLINIC_OR_DEPARTMENT_OTHER): Payer: BC Managed Care – PPO | Attending: Pulmonary Disease | Admitting: Pulmonary Disease

## 2021-06-05 DIAGNOSIS — G4736 Sleep related hypoventilation in conditions classified elsewhere: Secondary | ICD-10-CM | POA: Diagnosis not present

## 2021-06-05 DIAGNOSIS — G4733 Obstructive sleep apnea (adult) (pediatric): Secondary | ICD-10-CM | POA: Insufficient documentation

## 2021-06-05 DIAGNOSIS — Z9989 Dependence on other enabling machines and devices: Secondary | ICD-10-CM | POA: Diagnosis not present

## 2021-06-05 NOTE — Telephone Encounter (Signed)
Next Visit: 08/03/2021   Last Visit: 02/02/2021   Last Fill: 03/14/2021   Dx: Primary osteoarthritis of both hands    Current Dose per office note on 02/02/2021: Robaxin 750 mg 1 tablet by mouth at bedtime as needed for muscle spasms.    Okay to refill Robaxin?

## 2021-06-07 DIAGNOSIS — G4733 Obstructive sleep apnea (adult) (pediatric): Secondary | ICD-10-CM

## 2021-06-07 DIAGNOSIS — Z9989 Dependence on other enabling machines and devices: Secondary | ICD-10-CM | POA: Diagnosis not present

## 2021-06-07 NOTE — Procedures (Signed)
     Patient Name: Nicole Oneill, Borelli Date: 06/05/2021 Gender: Female D.O.B: June 10, 1961 Age (years): 63 Referring Provider: Chi Mechele Collin Height (inches): 65 Interpreting Physician: Coralyn Helling MD, ABSM Weight (lbs): 130 RPSGT: Ulyess Mort BMI: 22 MRN: [id number] Neck Size: 13.00  CLINICAL INFORMATION Sleep Study Type: NPSG  Indication for sleep study: Fatigue, Snoring  Epworth Sleepiness Score: 10  SLEEP STUDY TECHNIQUE As per the AASM Manual for the Scoring of Sleep and Associated Events v2.3 (April 2016) with a hypopnea requiring 4% desaturations.  The channels recorded and monitored were frontal, central and occipital EEG, electrooculogram (EOG), submentalis EMG (chin), nasal and oral airflow, thoracic and abdominal wall motion, anterior tibialis EMG, snore microphone, electrocardiogram, and pulse oximetry.  MEDICATIONS Medications self-administered by patient taken the night of the study : AMBIEN, GABAPENTIN, MELOXICAM, METHOCARBAMOL, AMLODIPINE, ATORVASTATIN, naltrexone hcl  SLEEP ARCHITECTURE The study was initiated at 11:19:38 PM and ended at 5:27:49 AM.  Sleep onset time was 26.7 minutes and the sleep efficiency was 77.8%%. The total sleep time was 286.5 minutes.  Stage REM latency was 241.0 minutes.  The patient spent 16.1%% of the night in stage N1 sleep, 70.2%% in stage N2 sleep, 0.0%% in stage N3 and 13.8% in REM.  Alpha intrusion was absent.  Supine sleep was 30.98%.  RESPIRATORY PARAMETERS The overall apnea/hypopnea index (AHI) was 26.6 per hour. There were 12 total apneas, including 11 obstructive, 0 central and 1 mixed apneas. There were 115 hypopneas and 35 RERAs.  The AHI during Stage REM sleep was 6.1 per hour.  AHI while supine was 65.6 per hour.  The mean oxygen saturation was 93.8%. The minimum SpO2 during sleep was 77.0%.  moderate snoring was noted during this study.  CARDIAC DATA The 2 lead EKG demonstrated sinus rhythm. The  mean heart rate was 63.0 beats per minute. Other EKG findings include: None.  LEG MOVEMENT DATA The total PLMS were 0 with a resulting PLMS index of 0.0. Associated arousal with leg movement index was 1.3 .  IMPRESSIONS - Moderate obstructive sleep apnea with an AHI of 26.6 and SpO2 low of 77%. - Supplemental oxygen was not applied during this study. - occurred during this study (AHI = 26.6/h). - The patient snored with moderate snoring volume.  DIAGNOSIS - Obstructive Sleep Apnea (G47.33) - Nocturnal Hypoxemia (G47.36)  RECOMMENDATIONS - Therapeutic CPAP titration to determine optimal pressure required to alleviate sleep disordered breathing. - Alternative therapies include oral appliance, or surgical assessment. - Avoid alcohol, sedatives and other CNS depressants that may worsen sleep apnea and disrupt normal sleep architecture. - Sleep hygiene should be reviewed to assess factors that may improve sleep quality.  [Electronically signed] 06/07/2021 09:33 AM  Coralyn Helling MD, ABSM Diplomate, American Board of Sleep Medicine   NPI: [id number]  [facility] PH: [phone]   FX: [phone] ACCREDITED BY THE AMERICAN ACADEMY OF SLEEP MEDICINE

## 2021-06-09 ENCOUNTER — Other Ambulatory Visit: Payer: Self-pay

## 2021-06-09 MED ORDER — GABAPENTIN 300 MG PO CAPS: ORAL_CAPSULE | ORAL | 1 refills | Status: DC

## 2021-06-15 ENCOUNTER — Telehealth: Payer: Self-pay | Admitting: Pulmonary Disease

## 2021-06-15 ENCOUNTER — Telehealth (INDEPENDENT_AMBULATORY_CARE_PROVIDER_SITE_OTHER): Payer: BC Managed Care – PPO | Admitting: Sports Medicine

## 2021-06-15 ENCOUNTER — Other Ambulatory Visit: Payer: Self-pay

## 2021-06-15 DIAGNOSIS — G4733 Obstructive sleep apnea (adult) (pediatric): Secondary | ICD-10-CM

## 2021-06-15 DIAGNOSIS — Q7962 Hypermobile Ehlers-Danlos syndrome: Secondary | ICD-10-CM

## 2021-06-15 MED ORDER — AMBULATORY NON FORMULARY MEDICATION: 0 refills | Status: DC

## 2021-06-15 NOTE — Telephone Encounter (Signed)
 Pulmonary Telephone Encounter  Reviewed sleep study results:  AHI 26.6. Nadir SpO2 77%.  Discussed results with patient who agrees for evaluation and discussion regarding Inspire device  Assessment/Plan Moderate sleep apnea --REFER to ENT for Inspire device (AHI >15, BMI <22)

## 2021-06-15 NOTE — Progress Notes (Signed)
F/u Naltrexone therapy  No real response yet Still a lot of night pain Pain most of day Wakes with pain Taken this primarily at night  Pain is generalized as before  Has some wrist pain that responded to NTG patches as before  Plan to increase naltrexone to 5 ml at 4 pm 2 week trial  After that go to bid if not responding.  Sterling Big, MD  Video not working and had to do a telephone visit so Dillon

## 2021-06-15 NOTE — Assessment & Plan Note (Signed)
Naltrexone treatment trial   See note

## 2021-06-20 DIAGNOSIS — R531 Weakness: Secondary | ICD-10-CM | POA: Diagnosis not present

## 2021-06-20 DIAGNOSIS — R5382 Chronic fatigue, unspecified: Secondary | ICD-10-CM | POA: Diagnosis not present

## 2021-06-20 DIAGNOSIS — M25551 Pain in right hip: Secondary | ICD-10-CM | POA: Diagnosis not present

## 2021-06-20 DIAGNOSIS — M545 Low back pain, unspecified: Secondary | ICD-10-CM | POA: Diagnosis not present

## 2021-07-06 DIAGNOSIS — H43811 Vitreous degeneration, right eye: Secondary | ICD-10-CM | POA: Diagnosis not present

## 2021-07-11 DIAGNOSIS — M546 Pain in thoracic spine: Secondary | ICD-10-CM | POA: Diagnosis not present

## 2021-07-11 DIAGNOSIS — M542 Cervicalgia: Secondary | ICD-10-CM | POA: Diagnosis not present

## 2021-07-11 DIAGNOSIS — M9901 Segmental and somatic dysfunction of cervical region: Secondary | ICD-10-CM | POA: Diagnosis not present

## 2021-07-11 DIAGNOSIS — M9908 Segmental and somatic dysfunction of rib cage: Secondary | ICD-10-CM | POA: Diagnosis not present

## 2021-07-12 ENCOUNTER — Other Ambulatory Visit: Payer: Self-pay | Admitting: *Deleted

## 2021-07-12 MED ORDER — AMBULATORY NON FORMULARY MEDICATION: 0 refills | Status: DC

## 2021-07-21 NOTE — Progress Notes (Signed)
Office Visit Note  Patient: Nicole Oneill             Date of Birth: 1961-01-08           MRN: [id number]             PCP: Crist Infante, MD Referring: Crist Infante, MD Visit Date: 08/03/2021 Occupation: @GUAROCC @  Subjective:  Medication management  History of Present Illness: Nicole Oneill is a 61 y.o. female with history of osteoarthritis and degenerative disc disease.  She states she continues to have some deformities in her hands due to osteoarthritis but not much pain.  She states the pain is manageable with the naltrexone and meloxicam.  She takes meloxicam 7.5 mg once or twice a day as needed.  Naltrexone was added recently by Dr. Paulla Fore.  She states her pain was very well managed by Cymbalta but she could not tolerate due to constipation and bloating.  She tried Wellbutrin but had side effects as well.  She has been taking Robaxin at bedtime for muscle spasms.  She continues to have some stiffness and discomfort in her entire spine.  Activities of Daily Living:  Patient reports morning stiffness for 30 minutes.   Patient Denies nocturnal pain.  Difficulty dressing/grooming: Denies Difficulty climbing stairs: Denies Difficulty getting out of chair: Denies Difficulty using hands for taps, buttons, cutlery, and/or writing: Reports  Review of Systems  Constitutional:  Positive for fatigue.  HENT:  Positive for mouth dryness and nose dryness. Negative for mouth sores.   Eyes:  Positive for dryness. Negative for pain and itching.  Respiratory:  Negative for shortness of breath and difficulty breathing.   Cardiovascular:  Negative for chest pain and palpitations.  Gastrointestinal:  Positive for constipation. Negative for blood in stool and diarrhea.  Endocrine: Negative for increased urination.  Genitourinary:  Negative for difficulty urinating.  Musculoskeletal:  Positive for myalgias, morning stiffness, muscle tenderness and myalgias. Negative for joint pain, joint pain and joint  swelling.  Skin:  Negative for color change, rash and redness.  Allergic/Immunologic: Negative for susceptible to infections.  Neurological:  Negative for dizziness, numbness, headaches, memory loss and weakness.  Hematological:  Positive for bruising/bleeding tendency.  Psychiatric/Behavioral:  Negative for confusion.    PMFS History:  Patient Active Problem List   Diagnosis Date Noted   Nonobstructive CAD 05/04/2021   Hypertension 05/04/2021   Hyperlipidemia 05/04/2021   Shortness of breath 04/04/2021   OSA on CPAP 04/04/2021   Right shoulder pain 02/19/2019   DDD (degenerative disc disease), cervical 04/28/2018   DDD (degenerative disc disease), lumbar 10/29/2017   Sicca syndrome, unspecified 10/29/2017   Left anterior shoulder pain 11/27/2016   History of lumbar fusion 10/16/2016   History of fusion of cervical spine 10/16/2016   History of total knee arthroplasty, left 2012 10/16/2016   Primary insomnia 10/16/2016   DDD lumbar 10/15/2016   DJD (degenerative joint disease), cervical 10/15/2016   DDD (degenerative disc disease), thoracic 10/15/2016   Primary osteoarthritis of both knees 10/15/2016   Tendinopathy of right gluteus medius 05/29/2016   Chronic lumbar radiculopathy 03/08/2016   Heart murmur, systolic 0000000   Disorder of SI (sacroiliac) joint 06/02/2014   Functional gait abnormality 06/02/2014   Ehlers-Danlos syndrome type III 03/10/2014   Foot arch pain 03/10/2014   Left knee pain 05/26/2013   Primary osteoarthritis of both hands 05/26/2013   Effusion of knee joint, left 08/05/2012   Osteoarthritis of multiple joints 10/12/2010    Past  Medical History:  Diagnosis Date   Anemia    Arthritis    osteo   Atrial tachycardia (HCC)    x 10 years ago wias diagnosed with short runs of atrial tachycardia while wearing a heart monitor.  No problems since.   CAD (coronary artery disease)    noted on coronary CTA in 05/2019   Chronic dyspnea    Complication of  anesthesia    urinary retention requiring indwelling  foley catheterization   DDD (degenerative disc disease)    Ehlers-Danlos syndrome type III    Fibromyalgia    History of blood transfusion    Hyperlipidemia    Hypertension    Osteoarthritis    Sleep apnea, obstructive    Thoracic outlet syndrome 07/2020   per patient     Family History  Problem Relation Age of Onset   Stroke Mother        hemorrhagic   Hypertension Father    Cancer - Prostate Father    Atrial fibrillation Father    Congestive Heart Failure Father    Anorexia nervosa Sister    Heart attack Maternal Grandmother    Stroke Maternal Grandfather    Cancer - Colon Paternal Grandmother    Cancer - Colon Paternal Grandfather    Healthy Daughter    Healthy Daughter    Healthy Daughter    Past Surgical History:  Procedure Laterality Date   BACK SURGERY     Cervical fusion C5-7, fusion  T10-S1   DIAGNOSTIC LAPAROSCOPY     x 3   EYE SURGERY     bilateral tear duct fusion   JOINT REPLACEMENT     partial left knee 2005, total left knee 2012   KNEE ARTHROPLASTY     KNEE ARTHROSCOPY  08/06/2012   Procedure: ARTHROSCOPY KNEE;  Surgeon: Loanne Drilling, MD;  Location: [facility];  Service: Orthopedics;  Laterality: Left;  WITH SYNOVECTOMY and debridement   OSTEOTOMY     bilateral tibial   Social History   Social History Narrative   Patient lives at home with family.   Caffeine Use: 4-5 sodas weekly   Immunization History  Administered Date(s) Administered   Influenza Inj Mdck Quad Pf 04/22/2017   Influenza,inj,quad, With Preservative 04/08/2017   Moderna Sars-Covid-2 Vaccination 10/11/2019, 11/06/2019, 10/08/2020   PFIZER(Purple Top)SARS-COV-2 Vaccination 06/09/2020   Pfizer Covid-19 Vaccine Bivalent Booster 65yrs & up 03/22/2021     Objective: Vital Signs: BP 113/69 (BP Location: Right Arm, Patient Position: Sitting, Cuff Size: Normal)    Pulse 74    Ht 5\' 5"  (1.651 m)    Wt 135 lb (61.2 kg)    BMI 22.47  kg/m    Physical Exam Vitals and nursing note reviewed.  Constitutional:      Appearance: She is well-developed.  HENT:     Head: Normocephalic and atraumatic.  Eyes:     Conjunctiva/sclera: Conjunctivae normal.  Cardiovascular:     Rate and Rhythm: Normal rate and regular rhythm.     Heart sounds: Normal heart sounds.  Pulmonary:     Effort: Pulmonary effort is normal.     Breath sounds: Normal breath sounds.  Abdominal:     General: Bowel sounds are normal.     Palpations: Abdomen is soft.  Musculoskeletal:     Cervical back: Normal range of motion.  Lymphadenopathy:     Cervical: No cervical adenopathy.  Skin:    General: Skin is warm and dry.     Capillary Refill:  Capillary refill takes less than 2 seconds.  Neurological:     Mental Status: She is alert and oriented to person, place, and time.  Psychiatric:        Behavior: Behavior normal.     Musculoskeletal Exam: She had some limitation with lateral rotation.  Thoracic and lumbar spine have limited range of motion.  Shoulder joints, elbow joints, wrist joints with good range of motion.  She had bilateral CMC thickening and subluxation.  PIP and DIP thickening was noted.  No synovitis was noted.  Hip joints were in good range of motion.  She had left knee replacement which is still causing some discomfort and had warmth on palpation.  There was no tenderness over ankles or MTPs.  CDAI Exam: CDAI Score: -- Patient Global: --; Provider Global: -- Swollen: --; Tender: -- Joint Exam 08/03/2021   No joint exam has been documented for this visit   There is currently no information documented on the homunculus. Go to the Rheumatology activity and complete the homunculus joint exam.  Investigation: No additional findings.  Imaging: No results found.  Recent Labs: Lab Results  Component Value Date   WBC 6.3 02/02/2021   HGB 12.9 02/02/2021   PLT 210 02/02/2021   NA 142 03/24/2021   K 3.8 03/24/2021   CL 99  03/24/2021   CO2 28 03/24/2021   GLUCOSE 63 (L) 03/24/2021   BUN 18 03/24/2021   CREATININE 0.90 03/24/2021   BILITOT 0.6 02/02/2021   ALKPHOS 65 03/07/2020   AST 31 02/02/2021   ALT 38 (H) 02/02/2021   PROT 6.7 02/02/2021   ALBUMIN 4.5 03/07/2020   CALCIUM 9.1 03/24/2021   GFRAA 91 08/04/2020   QFTBGOLDPLUS NEGATIVE 03/21/2021    Speciality Comments: No specialty comments available.  Procedures:  No procedures performed Allergies: Patient has no known allergies.   Assessment / Plan:     Visit Diagnoses: Primary osteoarthritis of both hands -she has severe osteoarthritis involving CMC, PIP and DIP joints.  She is on meloxicam 7.5 to 15mg  by mouth daily as needed.  She takes Robaxin 750 mg 1 tablet by mouth at bedtime as needed for muscle spasms.  History of arthroplasty of left knee-she has chronic discomfort and some warmth on palpation.  Trochanteric bursitis of both hips-she is off-and-on pain in her trochanteric area.  She has mild tenderness on palpation today.  IT band stretches were discussed.  DDD (degenerative disc disease), cervical - She was seen by Dr. Oneida Alar. Now she is going to Dr. Paulla Fore and for dry needling.  She had good relief from Cymbalta but had to discontinue due to GI side effects.  She also tried Wellbutrin and discontinued.  She is on naltrexone 5 mL p.o. twice daily by Dr. Paulla Fore.  DDD (degenerative disc disease), lumbar - Status post fusion.  Chronic discomfort  DDD (degenerative disc disease), thoracic - Status post fusion.  Chronic discomfort  Sicca syndrome (HCC)-over-the-counter products were discussed.  Medication monitoring encounter -she is on meloxicam.  I will check labs today.  Plan: CBC with Differential/Platelet, COMPLETE METABOLIC PANEL WITH GFR  Osteopenia of multiple sites - she gets DEXA scan through her GYN.  Ehlers-Danlos syndrome type III - diagnosed by geneticist.  Other fatigue  Primary insomnia - She is on Ambien 5 mg 1  tablet by mouth at bedtime as needed for insomnia.  She is unable to sleep without Ambien.  History of sleep apnea - she is on CPAP  Orders: Orders Placed  This Encounter  Procedures   CBC with Differential/Platelet   COMPLETE METABOLIC PANEL WITH GFR   No orders of the defined types were placed in this encounter.   Follow-Up Instructions: Return in about 6 months (around 01/31/2022) for Osteoarthritis.   Bo Merino, MD  Note - This record has been created using Editor, commissioning.  Chart creation errors have been sought, but may not always  have been located. Such creation errors do not reflect on  the standard of medical care.

## 2021-07-28 DIAGNOSIS — M25552 Pain in left hip: Secondary | ICD-10-CM | POA: Diagnosis not present

## 2021-07-28 DIAGNOSIS — M545 Low back pain, unspecified: Secondary | ICD-10-CM | POA: Diagnosis not present

## 2021-07-28 DIAGNOSIS — M25551 Pain in right hip: Secondary | ICD-10-CM | POA: Diagnosis not present

## 2021-07-28 DIAGNOSIS — R531 Weakness: Secondary | ICD-10-CM | POA: Diagnosis not present

## 2021-08-02 DIAGNOSIS — H18593 Other hereditary corneal dystrophies, bilateral: Secondary | ICD-10-CM | POA: Diagnosis not present

## 2021-08-02 DIAGNOSIS — H43811 Vitreous degeneration, right eye: Secondary | ICD-10-CM | POA: Diagnosis not present

## 2021-08-03 ENCOUNTER — Ambulatory Visit (INDEPENDENT_AMBULATORY_CARE_PROVIDER_SITE_OTHER): Payer: BC Managed Care – PPO | Admitting: Rheumatology

## 2021-08-03 ENCOUNTER — Encounter: Payer: Self-pay | Admitting: Rheumatology

## 2021-08-03 ENCOUNTER — Other Ambulatory Visit: Payer: Self-pay

## 2021-08-03 VITALS — BP 113/69 | HR 74 | Ht 65.0 in | Wt 135.0 lb

## 2021-08-03 DIAGNOSIS — M3500 Sicca syndrome, unspecified: Secondary | ICD-10-CM

## 2021-08-03 DIAGNOSIS — Q7962 Hypermobile Ehlers-Danlos syndrome: Secondary | ICD-10-CM

## 2021-08-03 DIAGNOSIS — M5136 Other intervertebral disc degeneration, lumbar region: Secondary | ICD-10-CM

## 2021-08-03 DIAGNOSIS — M5134 Other intervertebral disc degeneration, thoracic region: Secondary | ICD-10-CM

## 2021-08-03 DIAGNOSIS — M5030 Other cervical disc degeneration, unspecified cervical region: Secondary | ICD-10-CM

## 2021-08-03 DIAGNOSIS — Z8669 Personal history of other diseases of the nervous system and sense organs: Secondary | ICD-10-CM

## 2021-08-03 DIAGNOSIS — M8589 Other specified disorders of bone density and structure, multiple sites: Secondary | ICD-10-CM

## 2021-08-03 DIAGNOSIS — M19041 Primary osteoarthritis, right hand: Secondary | ICD-10-CM | POA: Diagnosis not present

## 2021-08-03 DIAGNOSIS — R5383 Other fatigue: Secondary | ICD-10-CM

## 2021-08-03 DIAGNOSIS — M19042 Primary osteoarthritis, left hand: Secondary | ICD-10-CM

## 2021-08-03 DIAGNOSIS — Z5181 Encounter for therapeutic drug level monitoring: Secondary | ICD-10-CM

## 2021-08-03 DIAGNOSIS — Z96652 Presence of left artificial knee joint: Secondary | ICD-10-CM

## 2021-08-03 DIAGNOSIS — F5101 Primary insomnia: Secondary | ICD-10-CM

## 2021-08-03 DIAGNOSIS — M7061 Trochanteric bursitis, right hip: Secondary | ICD-10-CM | POA: Diagnosis not present

## 2021-08-03 DIAGNOSIS — M7062 Trochanteric bursitis, left hip: Secondary | ICD-10-CM

## 2021-08-03 LAB — COMPLETE METABOLIC PANEL WITH GFR
AG Ratio: 2.3 (calc) (ref 1.0–2.5)
ALT: 14 U/L (ref 6–29)
AST: 17 U/L (ref 10–35)
Albumin: 4.1 g/dL (ref 3.6–5.1)
Alkaline phosphatase (APISO): 57 U/L (ref 37–153)
BUN: 17 mg/dL (ref 7–25)
CO2: 33 mmol/L — ABNORMAL HIGH (ref 20–32)
Calcium: 8.9 mg/dL (ref 8.6–10.4)
Chloride: 104 mmol/L (ref 98–110)
Creat: 0.91 mg/dL (ref 0.50–1.05)
Globulin: 1.8 g/dL (calc) — ABNORMAL LOW (ref 1.9–3.7)
Glucose, Bld: 121 mg/dL — ABNORMAL HIGH (ref 65–99)
Potassium: 4.2 mmol/L (ref 3.5–5.3)
Sodium: 142 mmol/L (ref 135–146)
Total Bilirubin: 0.4 mg/dL (ref 0.2–1.2)
Total Protein: 5.9 g/dL — ABNORMAL LOW (ref 6.1–8.1)
eGFR: 71 mL/min/{1.73_m2} (ref >=60)

## 2021-08-03 LAB — CBC WITH DIFFERENTIAL/PLATELET
Absolute Monocytes: 443 cells/uL (ref 200–950)
Basophils Absolute: 22 cells/uL (ref 0–200)
Basophils Relative: 0.4 %
Eosinophils Absolute: 59 cells/uL (ref 15–500)
Eosinophils Relative: 1.1 %
HCT: 35.2 % (ref 35.0–45.0)
Hemoglobin: 11.7 g/dL (ref 11.7–15.5)
Lymphs Abs: 1480 cells/uL (ref 850–3900)
MCH: 28.7 pg (ref 27.0–33.0)
MCHC: 33.2 g/dL (ref 32.0–36.0)
MCV: 86.5 fL (ref 80.0–100.0)
MPV: 10.2 fL (ref 7.5–12.5)
Monocytes Relative: 8.2 %
Neutro Abs: 3397 cells/uL (ref 1500–7800)
Neutrophils Relative %: 62.9 %
Platelets: 197 10*3/uL (ref 140–400)
RBC: 4.07 10*6/uL (ref 3.80–5.10)
RDW: 12.6 % (ref 11.0–15.0)
Total Lymphocyte: 27.4 %
WBC: 5.4 10*3/uL (ref 3.8–10.8)

## 2021-08-04 DIAGNOSIS — M545 Low back pain, unspecified: Secondary | ICD-10-CM | POA: Diagnosis not present

## 2021-08-04 DIAGNOSIS — M25552 Pain in left hip: Secondary | ICD-10-CM | POA: Diagnosis not present

## 2021-08-04 DIAGNOSIS — M9906 Segmental and somatic dysfunction of lower extremity: Secondary | ICD-10-CM | POA: Diagnosis not present

## 2021-08-04 DIAGNOSIS — M25551 Pain in right hip: Secondary | ICD-10-CM | POA: Diagnosis not present

## 2021-08-04 NOTE — Progress Notes (Signed)
Glucose mildly elevated, probably not a fasting sample.  CBC is normal.

## 2021-08-05 ENCOUNTER — Other Ambulatory Visit: Payer: Self-pay | Admitting: Rheumatology

## 2021-08-10 ENCOUNTER — Other Ambulatory Visit: Payer: Self-pay

## 2021-08-10 MED ORDER — AMBULATORY NON FORMULARY MEDICATION: 3 refills | Status: DC

## 2021-08-14 ENCOUNTER — Ambulatory Visit: Payer: BC Managed Care – PPO | Admitting: Pulmonary Disease

## 2021-08-15 ENCOUNTER — Other Ambulatory Visit: Payer: Self-pay | Admitting: Cardiovascular Disease

## 2021-08-18 DIAGNOSIS — M9901 Segmental and somatic dysfunction of cervical region: Secondary | ICD-10-CM | POA: Diagnosis not present

## 2021-08-18 DIAGNOSIS — M9908 Segmental and somatic dysfunction of rib cage: Secondary | ICD-10-CM | POA: Diagnosis not present

## 2021-08-18 DIAGNOSIS — M542 Cervicalgia: Secondary | ICD-10-CM | POA: Diagnosis not present

## 2021-08-18 DIAGNOSIS — M545 Low back pain, unspecified: Secondary | ICD-10-CM | POA: Diagnosis not present

## 2021-08-24 ENCOUNTER — Other Ambulatory Visit: Payer: Self-pay | Admitting: Physician Assistant

## 2021-08-24 NOTE — Telephone Encounter (Signed)
Next Visit: 01/31/2022  Last Visit: 08/03/2021  Last Fill: 10/17/2020  DX: Primary osteoarthritis of both hands   Current Dose per office note 08/03/2021: meloxicam 7.5 to 15mg  by mouth daily as needed.  Labs: 08/03/2021 Glucose mildly elevated, probably not a fasting sample.  CBC is normal.  Okay to refill Mobic?

## 2021-08-29 IMAGING — DX DG CHEST 1V PORT
1 series · 1 of 1 positions shown · non-contrast
Comparison: Portable exam 5000 hours compared to 01/08/2005

CLINICAL DATA: Sudden onset mid sternal chest pain, dizziness,
shortness of breath, nausea, and vomiting, pain radiating to LEFT
arm, history coronary artery disease, a lower stand low syndrome
type 3

EXAM:
PORTABLE CHEST 1 VIEW

[chest]
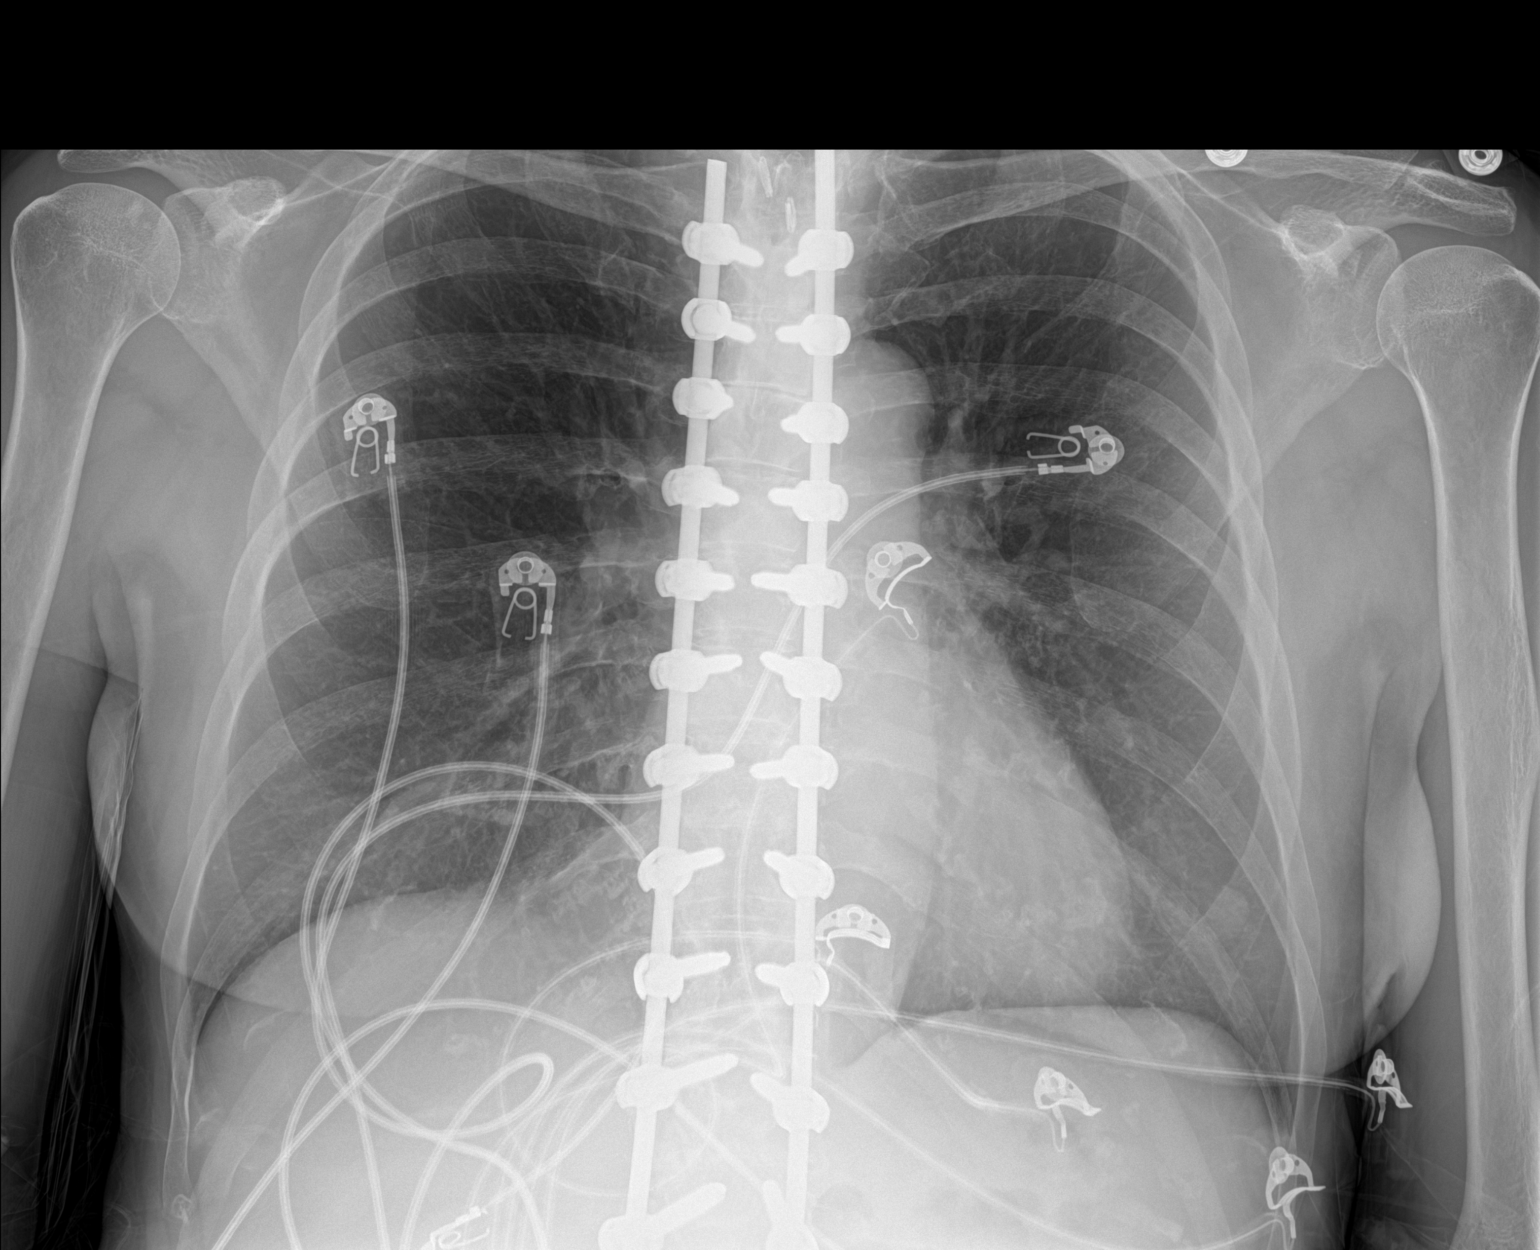

[1 of 1 positions shown; findings below may reference images not displayed]

FINDINGS: Prior spinal fixation and cervical spine fusion.

Normal heart size, mediastinal contours, and pulmonary vascularity.

Lungs clear.

No acute infiltrate, pleural effusion, or pneumothorax.

Resolution of scoliosis since prior study.

Question LEFT nipple shadow versus EKG lead or pulmonary nodule.

No acute osseous findings.
IMPRESSION: No acute infiltrate.

Question LEFT nipple shadow versus EKG lead or pulmonary nodule;
follow-up upright PA chest radiograph with nipple markers
recommended to exclude pulmonary nodule.

## 2021-08-30 ENCOUNTER — Other Ambulatory Visit: Payer: Self-pay | Admitting: Rheumatology

## 2021-08-30 NOTE — Telephone Encounter (Signed)
Next Visit: 01/31/2022   Last Visit: 08/03/2021   Last Fill: 06/05/2021  Dx: Primary osteoarthritis of both hands   Current Dose per office note 08/03/2021: Robaxin 750 mg 1 tablet by mouth at bedtime as needed for muscle spasms  Okay to refill Methocarbamol?

## 2021-09-05 DIAGNOSIS — M9907 Segmental and somatic dysfunction of upper extremity: Secondary | ICD-10-CM | POA: Diagnosis not present

## 2021-09-05 DIAGNOSIS — M25552 Pain in left hip: Secondary | ICD-10-CM | POA: Diagnosis not present

## 2021-09-05 DIAGNOSIS — M25512 Pain in left shoulder: Secondary | ICD-10-CM | POA: Diagnosis not present

## 2021-09-05 DIAGNOSIS — M9908 Segmental and somatic dysfunction of rib cage: Secondary | ICD-10-CM | POA: Diagnosis not present

## 2021-09-07 DIAGNOSIS — H18591 Other hereditary corneal dystrophies, right eye: Secondary | ICD-10-CM | POA: Diagnosis not present

## 2021-09-13 DIAGNOSIS — Z6822 Body mass index (BMI) 22.0-22.9, adult: Secondary | ICD-10-CM | POA: Diagnosis not present

## 2021-09-13 DIAGNOSIS — Z01419 Encounter for gynecological examination (general) (routine) without abnormal findings: Secondary | ICD-10-CM | POA: Diagnosis not present

## 2021-09-25 DIAGNOSIS — M9906 Segmental and somatic dysfunction of lower extremity: Secondary | ICD-10-CM | POA: Diagnosis not present

## 2021-09-25 DIAGNOSIS — M542 Cervicalgia: Secondary | ICD-10-CM | POA: Diagnosis not present

## 2021-09-25 DIAGNOSIS — M25551 Pain in right hip: Secondary | ICD-10-CM | POA: Diagnosis not present

## 2021-09-25 DIAGNOSIS — M9901 Segmental and somatic dysfunction of cervical region: Secondary | ICD-10-CM | POA: Diagnosis not present

## 2021-09-26 ENCOUNTER — Ambulatory Visit: Payer: Self-pay

## 2021-09-26 ENCOUNTER — Other Ambulatory Visit: Payer: Self-pay | Admitting: Sports Medicine

## 2021-09-26 ENCOUNTER — Other Ambulatory Visit: Payer: Self-pay | Admitting: Cardiovascular Disease

## 2021-09-26 ENCOUNTER — Ambulatory Visit (INDEPENDENT_AMBULATORY_CARE_PROVIDER_SITE_OTHER): Payer: BC Managed Care – PPO | Admitting: Sports Medicine

## 2021-09-26 VITALS — BP 105/62 | Ht 64.5 in | Wt 128.0 lb

## 2021-09-26 DIAGNOSIS — M25462 Effusion, left knee: Secondary | ICD-10-CM | POA: Diagnosis not present

## 2021-09-26 DIAGNOSIS — M25562 Pain in left knee: Secondary | ICD-10-CM | POA: Diagnosis not present

## 2021-09-26 DIAGNOSIS — Q7962 Hypermobile Ehlers-Danlos syndrome: Secondary | ICD-10-CM | POA: Diagnosis not present

## 2021-09-26 NOTE — Assessment & Plan Note (Signed)
She now seems to keep some chronic effusions and it appears like capsular stretching ?Knee Is slightly  unstable on testing ? ?Start using a nightly ice compression device and see if we can limit swelling ?Work on a series of quad strength exercises ?Keep up pilates ? ?May want a compression sleeie for walking or activities ?

## 2021-09-26 NOTE — Assessment & Plan Note (Signed)
OK to wean off Naltrexone if no benefit ?May look at other classes of meds since Cymbalta helped in past if we need to try something else ?

## 2021-09-26 NOTE — Progress Notes (Signed)
CC: Left knee pain and seems unstable ? ?Patient has EDS ?Lots of prior surgery including fusion of the entire spine ?She Had total knee replacement on left in 2011 after earlier partial in 2005 ?Now the knee feels unsteady at times ?PT evaluations have focused on strength increase but this has not helped much ? ?Sometimes gets some swelling ? ?Trial of Naltrexone for EDS pain ?She went up to 10 mg ?Now has cut back to 3 mg ?She is not sure that she is seeing much difference or benefit ? ?Otherwise continues pilates and that helps ? ?Cymbalta really helped but then she developed GI side effects ? ?PE ?Pleasant F in NAD ?BP 105/62   Ht 5' 4.5" (1.638 m)   Wt 128 lb (58.1 kg)   BMI 21.63 kg/m?  ? Left knee ?Long scar over anterior knee ?Drawer and Lachman testing show laxity ?Medial and lateral stress seem stable ?Flexion and extension good ?Strength is moderate ? ?Ultrasound of Left knee ? ?There is a spur off superior patella that cuts into the patellar tendon with some slight hypoechoic change ?Metallic plate in place in distal femur with mild accumulation of hypoechoic fluid ?This is noted with capsular distention and swelling on medial and lateral aspect of superior knee ?Some swelling along medial and lateral joint lines ? ?Impression;  some chronic effusions and capsular distention following remote TKR ?

## 2021-10-02 DIAGNOSIS — R051 Acute cough: Secondary | ICD-10-CM | POA: Diagnosis not present

## 2021-10-02 DIAGNOSIS — J101 Influenza due to other identified influenza virus with other respiratory manifestations: Secondary | ICD-10-CM | POA: Diagnosis not present

## 2021-10-10 DIAGNOSIS — G4733 Obstructive sleep apnea (adult) (pediatric): Secondary | ICD-10-CM | POA: Diagnosis not present

## 2021-10-27 DIAGNOSIS — M9902 Segmental and somatic dysfunction of thoracic region: Secondary | ICD-10-CM | POA: Diagnosis not present

## 2021-10-27 DIAGNOSIS — M546 Pain in thoracic spine: Secondary | ICD-10-CM | POA: Diagnosis not present

## 2021-10-27 DIAGNOSIS — M9901 Segmental and somatic dysfunction of cervical region: Secondary | ICD-10-CM | POA: Diagnosis not present

## 2021-10-27 DIAGNOSIS — M545 Low back pain, unspecified: Secondary | ICD-10-CM | POA: Diagnosis not present

## 2021-10-28 ENCOUNTER — Other Ambulatory Visit: Payer: Self-pay | Admitting: Rheumatology

## 2021-10-30 NOTE — Telephone Encounter (Signed)
Next Visit: 01/31/2022 ? ?Last Visit: 08/03/2021 ? ?Last Fill: 08/06/2021 ? ?Dx: Primary insomnia  ? ?Current Dose per office note on 08/03/2021: Ambien 5 mg 1 tablet by mouth at bedtime  ? ?Okay to refill Ambien?   ?

## 2021-11-02 ENCOUNTER — Other Ambulatory Visit: Payer: Self-pay | Admitting: Cardiovascular Disease

## 2021-11-20 ENCOUNTER — Other Ambulatory Visit: Payer: Self-pay | Admitting: Cardiovascular Disease

## 2021-11-20 DIAGNOSIS — M25551 Pain in right hip: Secondary | ICD-10-CM | POA: Diagnosis not present

## 2021-11-20 DIAGNOSIS — M9906 Segmental and somatic dysfunction of lower extremity: Secondary | ICD-10-CM | POA: Diagnosis not present

## 2021-11-20 DIAGNOSIS — M25522 Pain in left elbow: Secondary | ICD-10-CM | POA: Diagnosis not present

## 2021-11-23 ENCOUNTER — Other Ambulatory Visit: Payer: Self-pay

## 2021-11-23 ENCOUNTER — Encounter: Payer: Self-pay | Admitting: Sports Medicine

## 2021-11-23 MED ORDER — GABAPENTIN 300 MG PO CAPS: ORAL_CAPSULE | ORAL | 1 refills | Status: DC

## 2021-11-28 ENCOUNTER — Ambulatory Visit: Payer: BC Managed Care – PPO | Admitting: Sports Medicine

## 2021-11-30 ENCOUNTER — Ambulatory Visit (INDEPENDENT_AMBULATORY_CARE_PROVIDER_SITE_OTHER): Payer: BC Managed Care – PPO | Admitting: Sports Medicine

## 2021-11-30 VITALS — BP 124/80 | Ht 64.5 in | Wt 130.0 lb

## 2021-11-30 DIAGNOSIS — G8929 Other chronic pain: Secondary | ICD-10-CM

## 2021-11-30 DIAGNOSIS — M47816 Spondylosis without myelopathy or radiculopathy, lumbar region: Secondary | ICD-10-CM

## 2021-11-30 DIAGNOSIS — M25562 Pain in left knee: Secondary | ICD-10-CM

## 2021-11-30 MED ORDER — DIAZEPAM 5 MG PO TABS: 5.0000 mg | ORAL_TABLET | Freq: Every day | ORAL | 1 refills | Status: DC

## 2021-11-30 NOTE — Progress Notes (Signed)
Chief complaint left knee feels unstable  Patient had a very successful total knee replacement by Dr. Lequita Halt in 2011.  She had had a unicompartmental replacement before that.  This had done well until the past year or so when has steadily felt more unstable.  On her last visit I felt like she had too much motion and gave her an exercise regimen and a compression sleeve.  That has not made much difference in her symptoms.  She returns for my evaluation.  Problem #2 is tingling and some pain in the left hand fourth and fifth fingers after lifting something that felt a bit too heavy.  She felt like her elbow hyperextended too much.  She has Ehlers-Danlos syndrome and her elbow joint still shows significant hyperextension.  Problem #3 is a recurrence of significant right-sided sciatic pain.  This radiates from her upper buttocks down the back of her thigh it rotates around under the foot all the way to the great toe.  She has multiple levels of fusion in her spine.  She has had great success with epidural injections by Dr. Paulina Fusi with her last 1 being in December 2019.  Physical exam Pleasant female who is in no acute distress BP 124/80   Ht 5' 4.5" (1.638 m)   Wt 130 lb (59 kg)   BMI 21.97 kg/m   While I can get normal flexion and extension of the left main she seems to have too much anterior translation on a simple drawer test.  There is also some slight increased motion on horizontal stress in a neutral position both medially and laterally there is no significant effusion noted  Both elbows hyperextend.  In the cubital tunnel of the left elbow there is some very mild tenderness to palpation.  I was not able to produce a Tinel's sign.  She has normal grip strength.  She has pain with some palpable tenderness along the upper right buttocks.  This is worsened by straight leg raise of the right leg.

## 2021-11-30 NOTE — Assessment & Plan Note (Signed)
Her knee replacement has worked well for many years but I am concerned that some of the components may have loosened or something has changed to give her excess motion. I would like Dr. Lequita Halt to reevaluate this to see if he thinks more aggressive intervention is needed

## 2021-11-30 NOTE — Assessment & Plan Note (Signed)
I believe she is having an L5-S1 radiculopathy down her right leg.  This would correspond with what she has had in past years.  She has had such a good response with epidural injection in the past I would like Dr. Maree Erie to try this with her again.  She is already on gabapentin Still wakes her at night with spasm Does not anemi a has not relieved this  trial on diazepam 5 mg at night to see if this helps block the spasm

## 2021-12-01 ENCOUNTER — Other Ambulatory Visit: Payer: Self-pay | Admitting: Physician Assistant

## 2021-12-01 ENCOUNTER — Other Ambulatory Visit: Payer: Self-pay | Admitting: Cardiovascular Disease

## 2021-12-01 ENCOUNTER — Other Ambulatory Visit: Payer: Self-pay | Admitting: Sports Medicine

## 2021-12-01 DIAGNOSIS — M47816 Spondylosis without myelopathy or radiculopathy, lumbar region: Secondary | ICD-10-CM

## 2021-12-05 ENCOUNTER — Ambulatory Visit
Admission: RE | Admit: 2021-12-05 | Discharge: 2021-12-05 | Disposition: A | Discharge status: Home / Self Care | Payer: BC Managed Care – PPO | Source: Ambulatory Visit | Attending: Sports Medicine | Admitting: Sports Medicine

## 2021-12-05 DIAGNOSIS — M4326 Fusion of spine, lumbar region: Secondary | ICD-10-CM | POA: Diagnosis not present

## 2021-12-05 DIAGNOSIS — M5416 Radiculopathy, lumbar region: Secondary | ICD-10-CM | POA: Diagnosis not present

## 2021-12-05 DIAGNOSIS — M47816 Spondylosis without myelopathy or radiculopathy, lumbar region: Secondary | ICD-10-CM

## 2021-12-05 MED ORDER — METHYLPREDNISOLONE ACETATE 40 MG/ML INJ SUSP (RADIOLOG
80.0000 mg | Freq: Once | INTRAMUSCULAR | Status: AC
  Administered 2021-12-05: 80 mg via EPIDURAL

## 2021-12-05 MED ORDER — IOPAMIDOL (ISOVUE-M 200) INJECTION 41%
1.0000 mL | Freq: Once | INTRAMUSCULAR | Status: AC
  Administered 2021-12-05: 1 mL via EPIDURAL

## 2021-12-05 NOTE — Telephone Encounter (Signed)
Next Visit: 01/31/2022  Last Visit: 08/03/2021  Last Fill: 08/24/2021  DX: Primary osteoarthritis of both hands   Current Dose per office note 08/03/2021: meloxicam 7.5 to 15mg  by mouth daily as needed.  Labs: 08/03/2021 Glucose mildly elevated, probably not a fasting sample.  CBC is normal.  Okay to refill Mobic?

## 2021-12-05 NOTE — Discharge Instructions (Signed)

## 2021-12-21 ENCOUNTER — Other Ambulatory Visit: Payer: Self-pay | Admitting: Rheumatology

## 2021-12-21 MED ORDER — METHOCARBAMOL 750 MG PO TABS: 750.0000 mg | ORAL_TABLET | Freq: Every evening | ORAL | 2 refills | Status: DC | PRN

## 2021-12-21 NOTE — Telephone Encounter (Signed)
Patient called the office requesting a refill of Methocarbamol 750mg  be sent to [facility] at [facility]

## 2021-12-21 NOTE — Telephone Encounter (Signed)
Next Visit: 01/31/2022   Last Visit: 08/03/2021   Last Fill: 08/30/2021   Dx: Primary osteoarthritis of both hands    Current Dose per office note 08/03/2021: Robaxin 750 mg 1 tablet by mouth at bedtime as needed for muscle spasms   Okay to refill Methocarbamol?

## 2021-12-22 ENCOUNTER — Other Ambulatory Visit: Payer: Self-pay | Admitting: Cardiovascular Disease

## 2022-01-04 DIAGNOSIS — Z96652 Presence of left artificial knee joint: Secondary | ICD-10-CM | POA: Diagnosis not present

## 2022-01-07 ENCOUNTER — Other Ambulatory Visit: Payer: Self-pay | Admitting: Cardiovascular Disease

## 2022-01-17 NOTE — Progress Notes (Signed)
Office Visit Note  Patient: Nicole Oneill             Date of Birth: [birth year]           MRN: [id number]             PCP: [name], MD Referring: [name], MD Visit Date: 01/31/2022 Occupation: @GUAROCC @  Subjective:  Medication management.   History of Present Illness: Nicole Oneill is a 61 y.o. female with history of osteoarthritis and degenerative disc disease.  She states she continues to have pain and discomfort in multiple joints.  She has discomfort in her entire spine.  She has been experiencing discomfort in her left shoulder joint for which she has been seeing Dr. 62.  She states her left knee replacement locks and feels loose.  She was evaluated by Dr. Darrick Penna recently who ordered bone scan which is negative for loosening.  She also had epidural injection for ongoing pain and discomfort and radiculopathy to her right lower extremity by Dr. Despina Hick.  She has been taking Ambien 5 mg at bedtime which helps her sleep.  She states the Robaxin 750 mg at bedtime helps her with the muscle spasm and discomfort.  She is unable to sleep without these  medications.  She decided to come off meloxicam as she was concerned about the side effects of the medication.  Activities of Daily Living:  Patient reports morning stiffness for 15-20 minutes.   Patient Reports nocturnal pain.  Difficulty dressing/grooming: Denies Difficulty climbing stairs: Denies Difficulty getting out of chair: Denies Difficulty using hands for taps, buttons, cutlery, and/or writing: Reports  Review of Systems  Constitutional:  Positive for fatigue.  HENT:  Positive for mouth dryness. Negative for mouth sores.   Eyes:  Positive for dryness.  Respiratory:  Negative for shortness of breath.   Cardiovascular:  Negative for chest pain and palpitations.  Gastrointestinal:  Positive for constipation. Negative for blood in stool and diarrhea.  Endocrine: Negative for increased urination.  Genitourinary:  Negative  for involuntary urination.  Musculoskeletal:  Positive for joint pain, joint pain, joint swelling, myalgias, morning stiffness, muscle tenderness and myalgias. Negative for muscle weakness.  Skin:  Negative for color change, rash, hair loss and sensitivity to sunlight.  Allergic/Immunologic: Negative for susceptible to infections.  Neurological:  Negative for dizziness and headaches.  Hematological:  Negative for swollen glands.  Psychiatric/Behavioral:  Negative for depressed mood and sleep disturbance. The patient is not nervous/anxious.     PMFS History:  Patient Active Problem List   Diagnosis Date Noted   Nonobstructive CAD 05/04/2021   Hypertension 05/04/2021   Hyperlipidemia 05/04/2021   Shortness of breath 04/04/2021   OSA on CPAP 04/04/2021   Right shoulder pain 02/19/2019   DDD (degenerative disc disease), cervical 04/28/2018   DDD (degenerative disc disease), lumbar 10/29/2017   Sicca syndrome, unspecified 10/29/2017   Left anterior shoulder pain 11/27/2016   History of lumbar fusion 10/16/2016   History of fusion of cervical spine 10/16/2016   History of total knee arthroplasty, left 2012 10/16/2016   Primary insomnia 10/16/2016   DDD lumbar 10/15/2016   DJD (degenerative joint disease), cervical 10/15/2016   DDD (degenerative disc disease), thoracic 10/15/2016   Primary osteoarthritis of both knees 10/15/2016   Tendinopathy of right gluteus medius 05/29/2016   Chronic lumbar radiculopathy 03/08/2016   Heart murmur, systolic 06/19/2014   Disorder of SI (sacroiliac) joint 06/02/2014   Functional gait abnormality 06/02/2014   Ehlers-Danlos syndrome  type III 03/10/2014   Foot arch pain 03/10/2014   Left knee pain 05/26/2013   Primary osteoarthritis of both hands 05/26/2013   Effusion of knee joint, left 08/05/2012   Osteoarthritis of multiple joints 10/12/2010    Past Medical History:  Diagnosis Date   Anemia    Arthritis    osteo   Atrial tachycardia (HCC)     x 10 years ago wias diagnosed with short runs of atrial tachycardia while wearing a heart monitor.  No problems since.   CAD (coronary artery disease)    noted on coronary CTA in 05/2019   Chronic dyspnea    Complication of anesthesia    urinary retention requiring indwelling  foley catheterization   DDD (degenerative disc disease)    Ehlers-Danlos syndrome type III    Fibromyalgia    History of blood transfusion    Hyperlipidemia    Hypertension    Osteoarthritis    Sleep apnea, obstructive    Thoracic outlet syndrome 07/2020   per patient     Family History  Problem Relation Age of Onset   Stroke Mother        hemorrhagic   Hypertension Father    Cancer - Prostate Father    Atrial fibrillation Father    Congestive Heart Failure Father    Anorexia nervosa Sister    Heart attack Maternal Grandmother    Stroke Maternal Grandfather    Cancer - Colon Paternal Grandmother    Cancer - Colon Paternal Grandfather    Healthy Daughter    Healthy Daughter    Healthy Daughter    Past Surgical History:  Procedure Laterality Date   BACK SURGERY     Cervical fusion C5-7, fusion  T10-S1   DIAGNOSTIC LAPAROSCOPY     x 3   EYE SURGERY     bilateral tear duct fusion   JOINT REPLACEMENT     partial left knee 2005, total left knee 2012   KNEE ARTHROPLASTY     KNEE ARTHROSCOPY  08/06/2012   Procedure: ARTHROSCOPY KNEE;  Surgeon: Loanne Drilling, MD;  Location: [facility];  Service: Orthopedics;  Laterality: Left;  WITH SYNOVECTOMY and debridement   OSTEOTOMY     bilateral tibial   Social History   Social History Narrative   Patient lives at home with family.   Caffeine Use: 4-5 sodas weekly   Immunization History  Administered Date(s) Administered   Influenza Inj Mdck Quad Pf 04/22/2017   Influenza,inj,quad, With Preservative 04/08/2017   Moderna Sars-Covid-2 Vaccination 10/11/2019, 11/06/2019, 10/08/2020   PFIZER(Purple Top)SARS-COV-2 Vaccination 06/09/2020   Pfizer Covid-19  Vaccine Bivalent Booster 23yrs & up 03/22/2021     Objective: Vital Signs: BP 117/71 (BP Location: Right Arm, Patient Position: Sitting, Cuff Size: Normal)   Pulse 71   Ht 5\' 5"  (1.651 m)   Wt 130 lb 6.4 oz (59.1 kg)   BMI 21.70 kg/m    Physical Exam Vitals and nursing note reviewed.  Constitutional:      Appearance: She is well-developed.  HENT:     Head: Normocephalic and atraumatic.  Eyes:     Conjunctiva/sclera: Conjunctivae normal.  Cardiovascular:     Rate and Rhythm: Normal rate and regular rhythm.     Heart sounds: Normal heart sounds.  Pulmonary:     Effort: Pulmonary effort is normal.     Breath sounds: Normal breath sounds.  Abdominal:     General: Bowel sounds are normal.     Palpations: Abdomen is  soft.  Musculoskeletal:     Cervical back: Normal range of motion.  Lymphadenopathy:     Cervical: No cervical adenopathy.  Skin:    General: Skin is warm and dry.     Capillary Refill: Capillary refill takes less than 2 seconds.  Neurological:     Mental Status: She is alert and oriented to person, place, and time.  Psychiatric:        Behavior: Behavior normal.      Musculoskeletal Exam: She had limited rotation of her cervical spine.  She had limited range of motion of her thoracic and lumbar spine.  Shoulder joints, elbow joints, wrist joints with good range of motion.  She had discomfort range of motion of her left shoulder joints.  She has subluxation of bilateral CMC joints with PIP and DIP thickening.  Hip joints with good range of motion.  Left knee joint was replaced.  No warmth swelling or effusion was noted.  She had discomfort range of motion.  There was no tenderness over ankles or MTPs.  CDAI Exam: CDAI Score: -- Patient Global: --; Provider Global: -- Swollen: --; Tender: -- Joint Exam 01/31/2022   No joint exam has been documented for this visit   There is currently no information documented on the homunculus. Go to the Rheumatology activity  and complete the homunculus joint exam.  Investigation: No additional findings.  Imaging: NM Bone Scan 3 Phase  Result Date: 01/24/2022 CLINICAL DATA:  Bilateral knee osteotomies, left knee replacement 2012, left knee pain EXAM: NUCLEAR MEDICINE 3-PHASE BONE SCAN TECHNIQUE: Radionuclide angiographic images, immediate static blood pool images, and 3-hour delayed static images were obtained of the bilateral knees after intravenous injection of radiopharmaceutical. RADIOPHARMACEUTICALS:  20.8 mCi Tc-44m MDP IV COMPARISON:  None Available. FINDINGS: Vascular phase: Photopenia related to left knee arthroplasty. Symmetrical radiotracer distribution otherwise within the bilateral knees. Blood pool phase: Photopenia related to left knee arthroplasty. No abnormal radiotracer accumulation. Delayed phase: Photopenia related to the left knee arthroplasty. Low level radiotracer uptake surrounding the left knee arthroplasty consistent with postsurgical change. There is mild radiotracer uptake within the medial compartment of the right knee, likely degenerative. IMPRESSION: 1. Postsurgical changes from left knee arthroplasty. No scintigraphic evidence of infection or loosening. 2. Degenerative uptake medial compartment right knee. Electronically Signed   By: Sharlet Salina M.D.   On: 01/24/2022 15:16    Recent Labs: Lab Results  Component Value Date   WBC 5.4 08/03/2021   HGB 11.7 08/03/2021   PLT 197 08/03/2021   NA 142 08/03/2021   K 4.2 08/03/2021   CL 104 08/03/2021   CO2 33 (H) 08/03/2021   GLUCOSE 121 (H) 08/03/2021   BUN 17 08/03/2021   CREATININE 0.91 08/03/2021   BILITOT 0.4 08/03/2021   ALKPHOS 65 03/07/2020   AST 17 08/03/2021   ALT 14 08/03/2021   PROT 5.9 (L) 08/03/2021   ALBUMIN 4.5 03/07/2020   CALCIUM 8.9 08/03/2021   GFRAA 91 08/04/2020   QFTBGOLDPLUS NEGATIVE 03/21/2021    Speciality Comments: No specialty comments available.  Procedures:  No procedures performed Allergies:  Patient has no known allergies.   Assessment / Plan:     Visit Diagnoses: Primary osteoarthritis of both hands -she has severe osteoarthritis with bilateral CMC left subluxation.  She also has bilateral PIP and DIP thickening.  Joint protection muscle strengthening was discussed.  She decided to come off meloxicam as she was concerned about the side effects of the medication.  History of  arthroplasty of left knee-she has been experiencing discomfort and instability.  She had a recent bone scan which she will discuss with Dr. Despina Hick.  Trochanteric bursitis of both hips-she has off-and-on discomfort in her trochanteric bursa.  She has been doing stretching exercises.  DDD (degenerative disc disease), cervical -she had limited range of motion of her cervical spine.  She is followed by Dr. Darrick Penna.  DDD (degenerative disc disease), lumbar - Status post fusion.  She complains of chronic discomfort.  She had recent epidural injection by Dr. Karin Golden which gave her some relief.  She was experiencing right-sided radiculopathy.  DDD (degenerative disc disease), thoracic - Status post fusion.  Chronic discomfort  Sicca syndrome (HCC)-she continues to have sicca symptoms.  Over-the-counter products were discussed at length.  Medication management -she recently discontinued meloxicam.  We will check labs today.  Plan: CBC with Differential/Platelet, COMPLETE METABOLIC PANEL WITH GFR  Osteopenia of multiple sites - she gets DEXA scan through her GYN.  I do not have results to review.  Vitamin D deficiency -she has history of vitamin D deficiency.  She has been taking vitamin D.  We will check levels today.  Plan: VITAMIN D 25 Hydroxy (Vit-D Deficiency, Fractures)  Ehlers-Danlos syndrome type III - diagnosed by geneticist.  She is followed by Dr. Darrick Penna.  Other fatigue  Primary insomnia -patient states that she is unable to sleep without Ambien and methocarbamol combination.  Ambien 5 mg 1 tablet by mouth  at bedtime as needed for insomnia.    History of sleep apnea - she is on CPAP  Orders: Orders Placed This Encounter  Procedures   CBC with Differential/Platelet   COMPLETE METABOLIC PANEL WITH GFR   VITAMIN D 25 Hydroxy (Vit-D Deficiency, Fractures)   Meds ordered this encounter  Medications   zolpidem (AMBIEN) 5 MG tablet    Sig: TAKE 1 TABLET BY MOUTH EVERY DAY AT BEDTIME AS NEEDED FOR SLEEP    Dispense:  30 tablet    Refill:  2    This request is for a new prescription for a controlled substance as required by Federal/State law.     Follow-Up Instructions: Return in about 6 months (around 08/03/2022) for Osteoarthritis.   Pollyann Savoy, MD  Note - This record has been created using Animal nutritionist.  Chart creation errors have been sought, but may not always  have been located. Such creation errors do not reflect on  the standard of medical care.

## 2022-01-18 ENCOUNTER — Other Ambulatory Visit: Payer: Self-pay

## 2022-01-18 ENCOUNTER — Encounter: Payer: Self-pay | Admitting: Sports Medicine

## 2022-01-18 ENCOUNTER — Other Ambulatory Visit (HOSPITAL_COMMUNITY): Payer: Self-pay | Admitting: Orthopedic Surgery

## 2022-01-18 ENCOUNTER — Other Ambulatory Visit: Payer: Self-pay | Admitting: Orthopedic Surgery

## 2022-01-18 DIAGNOSIS — Z96652 Presence of left artificial knee joint: Secondary | ICD-10-CM

## 2022-01-18 MED ORDER — GABAPENTIN 300 MG PO CAPS: ORAL_CAPSULE | ORAL | 3 refills | Status: DC

## 2022-01-23 ENCOUNTER — Other Ambulatory Visit: Payer: Self-pay | Admitting: Cardiovascular Disease

## 2022-01-24 ENCOUNTER — Ambulatory Visit (HOSPITAL_COMMUNITY)
Admission: RE | Admit: 2022-01-24 | Discharge: 2022-01-24 | Disposition: A | Discharge status: Home / Self Care | Payer: BC Managed Care – PPO | Source: Ambulatory Visit | Attending: Orthopedic Surgery | Admitting: Orthopedic Surgery

## 2022-01-24 ENCOUNTER — Encounter (HOSPITAL_COMMUNITY)
Admission: RE | Admit: 2022-01-24 | Discharge: 2022-01-24 | Disposition: A | Discharge status: Home / Self Care | Payer: BC Managed Care – PPO | Source: Ambulatory Visit | Attending: Orthopedic Surgery | Admitting: Orthopedic Surgery

## 2022-01-24 DIAGNOSIS — Z96642 Presence of left artificial hip joint: Secondary | ICD-10-CM | POA: Diagnosis not present

## 2022-01-24 DIAGNOSIS — Z96652 Presence of left artificial knee joint: Secondary | ICD-10-CM | POA: Insufficient documentation

## 2022-01-24 DIAGNOSIS — M25562 Pain in left knee: Secondary | ICD-10-CM | POA: Diagnosis not present

## 2022-01-24 DIAGNOSIS — M1711 Unilateral primary osteoarthritis, right knee: Secondary | ICD-10-CM | POA: Diagnosis not present

## 2022-01-24 MED ORDER — TECHNETIUM TC 99M MEDRONATE IV KIT
20.0000 | PACK | Freq: Once | INTRAVENOUS | Status: AC | PRN
  Administered 2022-01-24: 20 via INTRAVENOUS

## 2022-01-31 ENCOUNTER — Encounter: Payer: Self-pay | Admitting: Rheumatology

## 2022-01-31 ENCOUNTER — Ambulatory Visit (INDEPENDENT_AMBULATORY_CARE_PROVIDER_SITE_OTHER): Payer: BC Managed Care – PPO | Admitting: Rheumatology

## 2022-01-31 VITALS — BP 117/71 | HR 71 | Ht 65.0 in | Wt 130.4 lb

## 2022-01-31 DIAGNOSIS — Z79899 Other long term (current) drug therapy: Secondary | ICD-10-CM

## 2022-01-31 DIAGNOSIS — E559 Vitamin D deficiency, unspecified: Secondary | ICD-10-CM | POA: Diagnosis not present

## 2022-01-31 DIAGNOSIS — M5134 Other intervertebral disc degeneration, thoracic region: Secondary | ICD-10-CM

## 2022-01-31 DIAGNOSIS — M5136 Other intervertebral disc degeneration, lumbar region: Secondary | ICD-10-CM

## 2022-01-31 DIAGNOSIS — M7062 Trochanteric bursitis, left hip: Secondary | ICD-10-CM

## 2022-01-31 DIAGNOSIS — M3500 Sicca syndrome, unspecified: Secondary | ICD-10-CM

## 2022-01-31 DIAGNOSIS — M7061 Trochanteric bursitis, right hip: Secondary | ICD-10-CM | POA: Diagnosis not present

## 2022-01-31 DIAGNOSIS — M19041 Primary osteoarthritis, right hand: Secondary | ICD-10-CM

## 2022-01-31 DIAGNOSIS — M19042 Primary osteoarthritis, left hand: Secondary | ICD-10-CM

## 2022-01-31 DIAGNOSIS — R5383 Other fatigue: Secondary | ICD-10-CM

## 2022-01-31 DIAGNOSIS — M5030 Other cervical disc degeneration, unspecified cervical region: Secondary | ICD-10-CM

## 2022-01-31 DIAGNOSIS — Z5181 Encounter for therapeutic drug level monitoring: Secondary | ICD-10-CM

## 2022-01-31 DIAGNOSIS — Q7962 Hypermobile Ehlers-Danlos syndrome: Secondary | ICD-10-CM

## 2022-01-31 DIAGNOSIS — Z96652 Presence of left artificial knee joint: Secondary | ICD-10-CM

## 2022-01-31 DIAGNOSIS — F5101 Primary insomnia: Secondary | ICD-10-CM

## 2022-01-31 DIAGNOSIS — Z8669 Personal history of other diseases of the nervous system and sense organs: Secondary | ICD-10-CM

## 2022-01-31 DIAGNOSIS — M8589 Other specified disorders of bone density and structure, multiple sites: Secondary | ICD-10-CM

## 2022-01-31 MED ORDER — ZOLPIDEM TARTRATE 5 MG PO TABS
ORAL_TABLET | ORAL | 2 refills | Status: DC
Start: 2022-01-31 — End: 2022-05-23

## 2022-02-01 LAB — CBC WITH DIFFERENTIAL/PLATELET
Absolute Monocytes: 386 cells/uL (ref 200–950)
Basophils Absolute: 18 cells/uL (ref 0–200)
Basophils Relative: 0.4 %
Eosinophils Absolute: 78 cells/uL (ref 15–500)
Eosinophils Relative: 1.7 %
HCT: 38.1 % (ref 35.0–45.0)
Hemoglobin: 12.5 g/dL (ref 11.7–15.5)
Lymphs Abs: 1191 cells/uL (ref 850–3900)
MCH: 28.9 pg (ref 27.0–33.0)
MCHC: 32.8 g/dL (ref 32.0–36.0)
MCV: 88 fL (ref 80.0–100.0)
MPV: 10.3 fL (ref 7.5–12.5)
Monocytes Relative: 8.4 %
Neutro Abs: 2926 cells/uL (ref 1500–7800)
Neutrophils Relative %: 63.6 %
Platelets: 178 10*3/uL (ref 140–400)
RBC: 4.33 10*6/uL (ref 3.80–5.10)
RDW: 13.1 % (ref 11.0–15.0)
Total Lymphocyte: 25.9 %
WBC: 4.6 10*3/uL (ref 3.8–10.8)

## 2022-02-01 LAB — COMPLETE METABOLIC PANEL WITH GFR
AG Ratio: 2.3 (calc) (ref 1.0–2.5)
ALT: 19 U/L (ref 6–29)
AST: 18 U/L (ref 10–35)
Albumin: 4.6 g/dL (ref 3.6–5.1)
Alkaline phosphatase (APISO): 59 U/L (ref 37–153)
BUN: 17 mg/dL (ref 7–25)
CO2: 32 mmol/L (ref 20–32)
Calcium: 9.1 mg/dL (ref 8.6–10.4)
Chloride: 107 mmol/L (ref 98–110)
Creat: 0.8 mg/dL (ref 0.50–1.05)
Globulin: 2 g/dL (calc) (ref 1.9–3.7)
Glucose, Bld: 71 mg/dL (ref 65–99)
Potassium: 4.6 mmol/L (ref 3.5–5.3)
Sodium: 145 mmol/L (ref 135–146)
Total Bilirubin: 0.5 mg/dL (ref 0.2–1.2)
Total Protein: 6.6 g/dL (ref 6.1–8.1)
eGFR: 83 mL/min/{1.73_m2} (ref >=60)

## 2022-02-01 LAB — VITAMIN D 25 HYDROXY (VIT D DEFICIENCY, FRACTURES): Vit D, 25-Hydroxy: 37 ng/mL (ref 30–100)

## 2022-02-01 NOTE — Progress Notes (Signed)
CBC, CMP and vitamin D are normal.

## 2022-02-06 ENCOUNTER — Ambulatory Visit: Payer: Self-pay

## 2022-02-06 ENCOUNTER — Ambulatory Visit (INDEPENDENT_AMBULATORY_CARE_PROVIDER_SITE_OTHER): Payer: BC Managed Care – PPO | Admitting: Sports Medicine

## 2022-02-06 VITALS — BP 104/66 | Ht 64.5 in | Wt 130.0 lb

## 2022-02-06 DIAGNOSIS — S5402XD Injury of ulnar nerve at forearm level, left arm, subsequent encounter: Secondary | ICD-10-CM

## 2022-02-06 DIAGNOSIS — M25512 Pain in left shoulder: Secondary | ICD-10-CM

## 2022-02-06 DIAGNOSIS — S5402XA Injury of ulnar nerve at forearm level, left arm, initial encounter: Secondary | ICD-10-CM | POA: Insufficient documentation

## 2022-02-06 NOTE — Assessment & Plan Note (Signed)
I do not suspect this is radiating from her neck or shoulder pain. Stretch injury approximately 92mo with slight improvement with gabapentin 600mg  nightly. Recommend continued conservative management with medications. Follow up if no improvement in 6 weeks.

## 2022-02-06 NOTE — Assessment & Plan Note (Signed)
I suspect overuse injury 2/2 piliates classes. Slight weakness with empty can, supraspinatus tendinopathy. No obvious tear seen on [place] today, small area of hypoechoic region in supraspinatus, suggesting tendon strain. Recommend activity modification to pain and low weight rotator cuff exercises. Return to clinic if no improvement in 6 weeks Can consider formal PT if no improvement in symptoms

## 2022-02-06 NOTE — Progress Notes (Unsigned)
Established Patient Office Visit  Subjective   Patient ID: Nicole Oneill, female    DOB: 1961/01/22  Age: 61 y.o. MRN: [id number]  Left elbow and shoulder pain  Nicole Oneill is a 61yo female who presents today for follow up on L elbow pain with numbness and tingling that started about 15mo after lifting her husbands travel bag, which was heavier than she expected and had a stretch/hyperextension injury. She was previously on gabapentin, increased her dose since last visit for pain relief. Numbness and tingling are occurring less frequently. Symptoms are worse with flexion and during piliates class. She denies any new injuries, weakness, bruising or neck pain. Of note she also has been experiencing L shoulder pan for the past 2 mo, worse with overhead activities. She does participate in activities at piliates with body weight in full shoulder abduction that aggravates her pain. She denies any trauma, radiation of her pain down her arm or neck pain.    Objective:     BP 104/66   Ht 5' 4.5" (1.638 m)   Wt 130 lb (59 kg)   BMI 21.97 kg/m   Physical Exam Vitals reviewed.  Constitutional:      General: She is not in acute distress.    Appearance: Normal appearance. She is normal weight. She is not ill-appearing or toxic-appearing.  Neurological:     Mental Status: She is alert.   L elbow: no obvious deformity or asymmetry. No tenderness to palpation over medial or lateral epicondyle. FROM flexion and hyperextension. Strength 5/5 flexion and extension. Stable to valgus and varus stress. Negative Tinel's at elbow. Sensation to light touch intact. Distal pulse radial 2+. Triceps reflex 2/4 L shoulder: no obvious deformity or asymmetry. Tenderness to palpation over lateral shoulder, no tenderness to palpation to [facility] joint. FROM forward flexion, abduction, internal and external rotation. Strength 5/5 internal and external rotation. Slight weakness and pain on Empty can test.   Ultrasound of Left  Shoulder Biceps tendon visualized with minor tear with hypoechoic change on SAX and LAX Supraspinatus intact Subscapularis  intact Infraspinatus and teres minor intact AC joint with no effusion Soft tissue with some swelling and minor calcifications sub deltoid area above supraspinatus  Impression: minor swelling with possible early subdeltoid bursitis  Ultrasound and interpretation by Dr. Leonor Liv and Sibyl Parr. 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 Stretch injury approximately 50mo with slight improvement with gabapentin 600mg  nightly. Recommend continued conservative management with medications. Follow up if no improvement in 6 weeks.         Other   Left anterior shoulder pain - Primary    I suspect overuse injury 2/2 piliates classes. Slight weakness with empty can, supraspinatus tendinopathy. No obvious tear seen on today, small area of hypoechoic region in supraspinatus, suggesting tendon strain. Recommend activity modification to pain and low weight rotator cuff exercises. Return to clinic if no improvement in 6 weeks Can consider formal PT if no improvement in symptoms      Relevant Orders   [place] LIMITED JOINT SPACE STRUCTURES UP LEFT    Return in about 6 weeks (around 03/20/2022), or if symptoms worsen or fail to improve.    05/20/2022, DO  I observed and examined the  patient with the [facility] resident and agree with assessment and plan.  Note reviewed and modified by me. Sterling Big, MD

## 2022-02-07 ENCOUNTER — Other Ambulatory Visit: Payer: Self-pay | Admitting: Cardiovascular Disease

## 2022-02-08 DIAGNOSIS — Z471 Aftercare following joint replacement surgery: Secondary | ICD-10-CM | POA: Diagnosis not present

## 2022-02-08 DIAGNOSIS — Z96652 Presence of left artificial knee joint: Secondary | ICD-10-CM | POA: Diagnosis not present

## 2022-02-26 ENCOUNTER — Other Ambulatory Visit: Payer: Self-pay | Admitting: Obstetrics and Gynecology

## 2022-02-26 DIAGNOSIS — Z1231 Encounter for screening mammogram for malignant neoplasm of breast: Secondary | ICD-10-CM

## 2022-02-28 ENCOUNTER — Encounter: Payer: Self-pay | Admitting: Sports Medicine

## 2022-02-28 ENCOUNTER — Other Ambulatory Visit: Payer: Self-pay | Admitting: *Deleted

## 2022-02-28 MED ORDER — NITROGLYCERIN 0.2 MG/HR TD PT24
MEDICATED_PATCH | TRANSDERMAL | 1 refills | Status: DC
Start: 2022-02-28 — End: 2022-06-11

## 2022-03-03 ENCOUNTER — Other Ambulatory Visit: Payer: Self-pay | Admitting: Cardiovascular Disease

## 2022-03-03 ENCOUNTER — Other Ambulatory Visit: Payer: Self-pay | Admitting: Rheumatology

## 2022-03-05 DIAGNOSIS — G4733 Obstructive sleep apnea (adult) (pediatric): Secondary | ICD-10-CM | POA: Diagnosis not present

## 2022-03-05 NOTE — Telephone Encounter (Signed)
Next Visit: 08/08/2022  Last Visit: 01/31/2022  Last Fill: 08/30/2021  Dx: DDD (degenerative disc disease), cervical   Current Dose per office note on 01/31/2022: not discussed   Okay to refill Methocarbamol?

## 2022-03-07 DIAGNOSIS — M546 Pain in thoracic spine: Secondary | ICD-10-CM | POA: Diagnosis not present

## 2022-03-07 DIAGNOSIS — M9905 Segmental and somatic dysfunction of pelvic region: Secondary | ICD-10-CM | POA: Diagnosis not present

## 2022-03-07 DIAGNOSIS — M542 Cervicalgia: Secondary | ICD-10-CM | POA: Diagnosis not present

## 2022-03-07 DIAGNOSIS — M9901 Segmental and somatic dysfunction of cervical region: Secondary | ICD-10-CM | POA: Diagnosis not present

## 2022-03-13 ENCOUNTER — Other Ambulatory Visit: Payer: Self-pay | Admitting: Cardiovascular Disease

## 2022-03-26 DIAGNOSIS — M9901 Segmental and somatic dysfunction of cervical region: Secondary | ICD-10-CM | POA: Diagnosis not present

## 2022-03-26 DIAGNOSIS — M546 Pain in thoracic spine: Secondary | ICD-10-CM | POA: Diagnosis not present

## 2022-03-26 DIAGNOSIS — M9900 Segmental and somatic dysfunction of head region: Secondary | ICD-10-CM | POA: Diagnosis not present

## 2022-03-26 DIAGNOSIS — M25512 Pain in left shoulder: Secondary | ICD-10-CM | POA: Diagnosis not present

## 2022-03-28 ENCOUNTER — Ambulatory Visit
Admission: RE | Admit: 2022-03-28 | Discharge: 2022-03-28 | Disposition: A | Discharge status: Home / Self Care | Payer: BC Managed Care – PPO | Source: Ambulatory Visit | Attending: Obstetrics and Gynecology | Admitting: Obstetrics and Gynecology

## 2022-03-28 DIAGNOSIS — Z1231 Encounter for screening mammogram for malignant neoplasm of breast: Secondary | ICD-10-CM | POA: Diagnosis not present

## 2022-03-31 ENCOUNTER — Other Ambulatory Visit: Payer: Self-pay | Admitting: Physician Assistant

## 2022-04-02 NOTE — Telephone Encounter (Signed)
Per office note on 01/31/2022:  She decided to come off meloxicam as she was concerned about the side effects of the medication.

## 2022-04-05 DIAGNOSIS — G4733 Obstructive sleep apnea (adult) (pediatric): Secondary | ICD-10-CM | POA: Diagnosis not present

## 2022-04-09 ENCOUNTER — Other Ambulatory Visit: Payer: Self-pay | Admitting: Cardiovascular Disease

## 2022-04-12 DIAGNOSIS — M546 Pain in thoracic spine: Secondary | ICD-10-CM | POA: Diagnosis not present

## 2022-04-12 DIAGNOSIS — M542 Cervicalgia: Secondary | ICD-10-CM | POA: Diagnosis not present

## 2022-04-12 DIAGNOSIS — M9901 Segmental and somatic dysfunction of cervical region: Secondary | ICD-10-CM | POA: Diagnosis not present

## 2022-04-12 DIAGNOSIS — M25512 Pain in left shoulder: Secondary | ICD-10-CM | POA: Diagnosis not present

## 2022-04-21 DIAGNOSIS — Z23 Encounter for immunization: Secondary | ICD-10-CM | POA: Diagnosis not present

## 2022-04-22 ENCOUNTER — Other Ambulatory Visit: Payer: Self-pay | Admitting: Physician Assistant

## 2022-04-24 ENCOUNTER — Other Ambulatory Visit: Payer: Self-pay | Admitting: *Deleted

## 2022-04-24 MED ORDER — MELOXICAM 7.5 MG PO TABS: 7.5000 mg | ORAL_TABLET | Freq: Every day | ORAL | 2 refills | Status: DC

## 2022-04-24 NOTE — Telephone Encounter (Signed)
I called patient and discussed to reduce the dose of meloxicam to 7.5 mg p.o. daily as needed.  Side effects of meloxicam including the increased risk of GI bleed, hepatic and renal toxicity was also discussed.  Patient voiced understanding.  She was advised to come in 3 months for CBC with differential and CMP with GFR after starting meloxicam.

## 2022-04-24 NOTE — Telephone Encounter (Signed)
Patient contacted the office regarding a prescription for Meloxicam. Patient states at her last visit she had been off the Meloxicam and stayed off of it for about a month. Patient states she has increased hip and knee pain. Patient states she is now taking the medication 3 days on and 4 days off. Patient would like to have a refill sent to the pharmacy, [place].   Next Visit: 08/08/2022   Last Visit: 01/31/2022   Last Fill: 12/05/2021  Dx: Primary osteoarthritis of both hands   Okay to refill Meloxicam?

## 2022-04-29 ENCOUNTER — Other Ambulatory Visit: Payer: Self-pay | Admitting: Sports Medicine

## 2022-05-10 DIAGNOSIS — M542 Cervicalgia: Secondary | ICD-10-CM | POA: Diagnosis not present

## 2022-05-10 DIAGNOSIS — M9908 Segmental and somatic dysfunction of rib cage: Secondary | ICD-10-CM | POA: Diagnosis not present

## 2022-05-10 DIAGNOSIS — M546 Pain in thoracic spine: Secondary | ICD-10-CM | POA: Diagnosis not present

## 2022-05-10 DIAGNOSIS — M9907 Segmental and somatic dysfunction of upper extremity: Secondary | ICD-10-CM | POA: Diagnosis not present

## 2022-05-13 ENCOUNTER — Other Ambulatory Visit: Payer: Self-pay | Admitting: Cardiovascular Disease

## 2022-05-15 ENCOUNTER — Ambulatory Visit (INDEPENDENT_AMBULATORY_CARE_PROVIDER_SITE_OTHER): Payer: BC Managed Care – PPO | Admitting: Sports Medicine

## 2022-05-15 VITALS — BP 130/88 | Ht 64.5 in | Wt 130.0 lb

## 2022-05-15 DIAGNOSIS — M25512 Pain in left shoulder: Secondary | ICD-10-CM

## 2022-05-15 DIAGNOSIS — Q7962 Hypermobile Ehlers-Danlos syndrome: Secondary | ICD-10-CM

## 2022-05-15 DIAGNOSIS — G5622 Lesion of ulnar nerve, left upper limb: Secondary | ICD-10-CM | POA: Diagnosis not present

## 2022-05-15 MED ORDER — MELOXICAM 15 MG PO TABS: 15.0000 mg | ORAL_TABLET | Freq: Every day | ORAL | 5 refills | Status: AC

## 2022-05-15 NOTE — Patient Instructions (Signed)
We will resend prescription for Mobic 15 mg daily.  Recommend labs for kidney and renal function annually.  He may also begin over-the-counter supplementation of vitamin B6 100 mg daily and trial Zostrix over-the-counter topical cream for your ulnar nerve.  Apply a small amount the area at your elbow.  Avoid touching your eyes after using this cream.  Wash your hands thoroughly. Remember to do your exercises daily that we have given you today isometric shoulder abduction, flexion, external rotation and extension.  We will follow-up with you after a couple of weeks.  Continue with your Pilates routine.

## 2022-05-16 DIAGNOSIS — G5622 Lesion of ulnar nerve, left upper limb: Secondary | ICD-10-CM | POA: Insufficient documentation

## 2022-05-16 NOTE — Progress Notes (Signed)
Established Patient Office Visit  Subjective   Patient ID: Nicole Oneill, female    DOB: [birth year]  Age: 61 y.o. MRN: [id number]  Left elbow and shoulder pain follow-up.   She continues to have numbness and tingling in her left elbow especially with extension at Pilates.  She is unable to note any other times that she is experiencing this numbness and tingling she notes that it is random.  She and her Pilates instructor have tried modifications such as placing a pillow under her elbow she reports with minimal improvement.  She did increase her gabapentin in the evenings since her last visit at our recommendation however she reports no improvement.  She also is noticing some mobility in her shoulder with arm circles.  She feels like she cannot complete a normal cervical however on evaluation she is completing active arm circles with full range of motion.  She is pointing to anterior shoulder pain with this activity.  She has been using nitroglycerin patches on the lower lateral shoulder where she has pain.  She notes she had a biceps tear that was seen on ultrasound in April, upon review of previous records she had a very slight tear of her biceps noted in August on ultrasound.  She denies any radiation of pain down her arm.  She has not been complaining any specific shoulder exercises that were reviewed at her previous visit as she did not recall them. Of note between her rheumatologist and primary care physician she was lowered off of her 15 mg daily of Mobic to 7.5.  She had recent kidney and liver function test that did not show any abnormalities.  She is also beginning to taper her Ambien to 2.5 mg in the evenings.  She does not use her stimulant medication daily only as needed for drives.  She is also no longer using estradiol cream.  She would like to go back to 15 mg of Mobic as reports she is not getting the relief like she used to with the 15 mg and she is not having any side effects..  She  follows with rheumatology for fibromyalgia.   ROS as listed above in HPI    Objective:     BP 130/88   Ht 5' 4.5" (1.638 m)   Wt 130 lb (59 kg)   BMI 21.97 kg/m   Physical Exam Vitals reviewed.  Constitutional:      General: She is not in acute distress.    Appearance: Normal appearance. She is normal weight. She is not ill-appearing, toxic-appearing or diaphoretic.  Neurological:     Mental Status: She is alert.   Left elbow: No obvious deformity or asymmetry.  There is hyperextension noted.  No tenderness to palpation.  Positive Tinel's at the elbow. Left shoulder: No obvious deformity or asymmetry.  Full range of motion forward flexion, abduction internal and external rotation.  She does have moderate amount of laxity of her bilateral shoulders.  Strength 5/5 forward flexion and abduction.  Some discomfort with empty can testing but full strength.  Left shoulder laxity with forward arm circles.  This shoulder pain and instability improves with scapular assist test.    Assessment & Plan:   Problem List Items Addressed This Visit       Nervous and Auditory   Ulnar neuropathy of left upper extremity    Recommend increasing her gabapentin to 1200 mg at night, over-the-counter Zostrix and adding 100 mg of B6 to her daily medication regimen.  Discussed with her again this is a long healing process however she should begin to feel better and we will do supportive measures until then.  She should continue with her Pilates regimen using the pillow for support during extension exercises, careful not to hyperextend.  She verbalized understanding.  We will follow-up in 4 to 6 weeks.        Musculoskeletal and Integument   Ehlers-Danlos syndrome type III    New prescription for meloxicam 15 mg daily sent today.  On review of patient's labs within the last 6 months she had no abnormal liver or kidney functions.  If patient is to continue this medication for which I sent refills today she  will need to follow-up at least yearly for annual lab work.  She verbalized understanding.        Other   Left anterior shoulder pain - Primary    Patient was given isometric exercises to be performed twice a day until her pain has improved and then she may taper down to once a day however these are exercises she should be performing regularly.  Recommend she continue with her Pilates regimen.       Return in about 6 weeks (around 06/26/2022).    Claudie Leach, DO  I observed and examined the patient with the [facility] resident and agree with assessment and plan.  Note reviewed and modified by me. Sterling Big, MD

## 2022-05-16 NOTE — Assessment & Plan Note (Signed)
Recommend increasing her gabapentin to 1200 mg at night, over-the-counter Zostrix and adding 100 mg of B6 to her daily medication regimen.  Discussed with her again this is a long healing process however she should begin to feel better and we will do supportive measures until then.  She should continue with her Pilates regimen using the pillow for support during extension exercises, careful not to hyperextend.  She verbalized understanding.  We will follow-up in 4 to 6 weeks.

## 2022-05-16 NOTE — Assessment & Plan Note (Signed)
New prescription for meloxicam 15 mg daily sent today.  On review of patient's labs within the last 6 months she had no abnormal liver or kidney functions.  If patient is to continue this medication for which I sent refills today she will need to follow-up at least yearly for annual lab work.  She verbalized understanding.

## 2022-05-16 NOTE — Assessment & Plan Note (Signed)
Patient was given isometric exercises to be performed twice a day until her pain has improved and then she may taper down to once a day however these are exercises she should be performing regularly.  Recommend she continue with her Pilates regimen.

## 2022-05-23 ENCOUNTER — Other Ambulatory Visit: Payer: Self-pay | Admitting: Rheumatology

## 2022-05-23 NOTE — Telephone Encounter (Signed)
Next Visit: 08/08/2022  Last Visit: 01/31/2022  Last Fill: 01/31/2022  Dx: Primary insomnia   Current Dose per office note on 01/31/2022: Ambien 5 mg 1 tablet by mouth at bedtime as needed for insomnia.     Okay to refill Ambien?

## 2022-05-24 DIAGNOSIS — D225 Melanocytic nevi of trunk: Secondary | ICD-10-CM | POA: Diagnosis not present

## 2022-05-24 DIAGNOSIS — D1801 Hemangioma of skin and subcutaneous tissue: Secondary | ICD-10-CM | POA: Diagnosis not present

## 2022-05-24 DIAGNOSIS — L814 Other melanin hyperpigmentation: Secondary | ICD-10-CM | POA: Diagnosis not present

## 2022-05-24 DIAGNOSIS — L821 Other seborrheic keratosis: Secondary | ICD-10-CM | POA: Diagnosis not present

## 2022-06-11 ENCOUNTER — Ambulatory Visit: Payer: 59 | Attending: Cardiovascular Disease | Admitting: Cardiovascular Disease

## 2022-06-11 VITALS — BP 126/72 | HR 67 | Ht 65.0 in | Wt 135.0 lb

## 2022-06-11 DIAGNOSIS — I10 Essential (primary) hypertension: Secondary | ICD-10-CM | POA: Diagnosis not present

## 2022-06-11 DIAGNOSIS — I2584 Coronary atherosclerosis due to calcified coronary lesion: Secondary | ICD-10-CM

## 2022-06-11 DIAGNOSIS — Q796 Ehlers-Danlos syndrome, unspecified: Secondary | ICD-10-CM | POA: Diagnosis not present

## 2022-06-11 DIAGNOSIS — E785 Hyperlipidemia, unspecified: Secondary | ICD-10-CM

## 2022-06-11 DIAGNOSIS — G4733 Obstructive sleep apnea (adult) (pediatric): Secondary | ICD-10-CM | POA: Diagnosis not present

## 2022-06-11 DIAGNOSIS — I2510 Atherosclerotic heart disease of native coronary artery without angina pectoris: Secondary | ICD-10-CM

## 2022-06-11 NOTE — Patient Instructions (Addendum)
Medication Instructions:  If LDL from PCP blood work is greater than 70 INCREASE Atorvastatin (Lipitor) to 20 mg daily  *If you need a refill on your cardiac medications before your next appointment, please call your pharmacy*  Lab Work: NONE ordered at this time of appointment   If you have labs (blood work) drawn today and your tests are completely normal, you will receive your results only by: MyChart Message (if you have MyChart) OR A paper copy in the mail If you have any lab test that is abnormal or we need to change your treatment, we will call you to review the results.  Testing/Procedures: NONE ordered at this time of appointment   Follow-Up: At [facility], you and your health needs are our priority.  As part of our continuing mission to provide you with exceptional heart care, we have created designated Provider Care Teams.  These Care Teams include your primary Cardiologist (physician) and Advanced Practice Providers (APPs -  Physician Assistants and Nurse Practitioners) who all work together to provide you with the care you need, when you need it.  Your next appointment:   1 year(s)  The format for your next appointment:   In Person  Provider:   [name], MD     Other Instructions  Important Information About Sugar

## 2022-06-11 NOTE — Progress Notes (Signed)
Cardiology Office Note    Date:  06/18/2022   ID:  Nicole Oneill, DOB 1961/01/22, MRN [id number]  PCP:  [name], MD  Cardiologist:  [name], MD   33 month F/U   History of Present Illness:  Nicole Oneill is a 61 y.o. female  who I had initially seen in 2007, and saw again in 2015.  I saw her on June 01, 2019  following her ER and overnight hospitalization for chest pain.  I last saw her on September 10, 2019.  She presents for follow-up evaluation.  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  I had seen her in 2007 when she was experiencing episodes of palpitations and vague chest pain.  Prior to that visit, she had undergone numerous orthopedic procedures with cervical as well as lumbar fusions in addition to knee replacement.  An echo Doppler study in November 2007 revealed normal LV size and function.  She had mild mitral annular calcification.  Her mitral valve was not my myxomatous in appearance and she had evidence for mild mitral regurgitation.  There was trivial tricuspid regurgitation.  Her aortic root dimension was normal and measured 2.3 cm in diameter.  A nuclear perfusion study showed normal perfusion in all regions.  A cardiac monitor showed occasional episodes of sinus tachycardia as well as episodes of PACs, and short bursts of atrial tachycardia without ventricular ectopy.   I saw her when she reestablished with me in 2015 and at that time she had at least 18 surgeries in the last 12 years and had recently undergone underwent complete fusion of her back at [facility].  Due to her hypermobility, osteoarthritis, intervertebral  disc disease and joint pains she ultimately was referred by Dr Roanna Epley to [name] at [facility].  She was diagnosed with Ehlers-Danlos syndrome, hypermobility type III .  She also was seen in the genetics clinic  evaluation.  Because of the association of aortic root dilatation.  She now presents for cardiologic reevaluation.   When I saw her in 2015, she denied any chest pain and was unaware of any palpitations, presyncope or syncope.  It was recommended by [name] that she take take vitamin D and calcium supplements, vitamin C 1000 mg daily , which aids in the cross-linking of collagen fibrils and prolongs life of normal collagen, Epsom salt baths when she has musculoskeletal pain as well as potential oral magnesium supplementation.  From a cardiac standpoint, Nicole Oneill had been doing pretty well but admits that she often gets short of breath with minimal activity but she is not been able to exercise significantly and this is not any change.  On November 9 she was leaning forward doing Pilates exercise when she began to develop substernal chest pain.  She stopped the exercise and drove home.  Her chest pain increased and radiated to her left arm.  She also noticed significant diaphoresis and developed nausea and vomiting.  Due to progression to severe chest tightness EMS was contacted and she was taken to the emergency room.  Due to her [name]- Danlos history she underwent CT imaging which did not disclose any abnormality of her aorta and there was no evidence for dissection.  High-sensitivity troponins were minimally increased in a flat plateau at 43.> 53>42> 34.   Her ECG did not reveal any ST abnormality but showed  QS complex anteroseptally.  She was seen by [name] and subsequently underwent CTA imaging and was found to have a calcium score of 38 which was 81st percentile for age and sex matched control.  There was minimal nonobstructive plaque of 0 to 24% at the ostium of her large dominant RCA.  Remaining vessels were without any plaque.  A 2D echo Doppler study done on May 18, 2019 showed hyperdynamic LV function with an EF estimated 65 to 70% without focal segmental wall motion abnormalities.  There was  trace mitral regurgitation and trace tricuspid regurgitation.  Her aorta was normal dimension and there was no evidence for dilation or obstruction.  During her hospitalization lipid studies were notable for total cholesterol of 221, HDL 78, LDL 133 and triglycerides 52.  She was advised to take Crestor 5 mg 3 times per week.  Subsequent to her ER evaluation she was seen by [name] who advised cardiology follow-up and raised concern for possible Takotsubo cardiomyopathy.    I saw her for reestablishment of cardiology care on June 01, 2019.  At that time she denied any recurrent chest pain symptomatology.  She had been on hormone replacement therapy for over 10 years but stopped therapy approximately 4 months ago.  She continued to experience mild shortness of breath with walking.  At that time she also stated that she had seen Dr. [name] for obstructive sleep apnea and was previously diagnosed of having mild OSA.  She has had issues with a comfortable mask.  During her evaluation, I thoroughly reviewed her image studies including her echo Doppler and CTA evaluations.  Her Doppler study showed hyperdynamic LV function without wall motion abnormalities.  There was no evidence for apical ballooning or apical hypocontractility which I felt argued against Takotsubo cardiomyopathy.  Her coronary CTA revealed mild coronary calcification which I reviewed with her in detail.  With her LDL of 133 I recommended aggressive lipid-lowering therapy in order to achieve a target less than 70 in an attempt to hopefully induce plaque stability and regression.  I recommended she titrate her Crestor from 5 mg 3 times a week to every other day and if tolerates to daily.  Since her blood pressure was elevated I added low-dose amlodipine 2.5 mg daily both for optimal blood pressure as well as potential vasospasm benefit.  I also discussed new mask technology and suggested she discussed the consideration of a ResMed  AirFit N 30i mask with [name].  I saw her September 10, 2019 and since her prior evaluation she had felt well.  She specifically denied any chest pain PND orthopnea and was unaware of palpitations.  .  She apparently stopped taking simvastatin and started atorvastatin but has been taking this 10 mg daily.  She underwent repeat lipid studies on August 30, 2018 which shows marked improvement with total cholesterol now 128.  Her LDL cholesterol has improved from 133 down to 48.  I last saw her on September 10, 2019.  Over the prior 6 months she had remained fairly stable.  She was doing Pilates approximately 4-5 times per week.    She has had some issues with her right knee and she is status post prior left knee replacement.  There was some concern for possible sciatica down her right leg.  She sees Dr. Marta Lamas.  She continues to be on amlodipine 2.5 mg daily and atorvastatin 10 mg.  She takes gabapentin for nerve pain.  During that evaluation her blood  pressure was stable.  She was tolerating atorvastatin.  I discussed sleep apnea and potential adverse cardiovascular consequences.  Since I last saw her, she had undergone a follow-up sleep evaluation on June 05, 2021 by Dr. Craige Cotta which confirmed moderate overall sleep apnea with an AHI of 26.6/h.  In the supine position, sleep apnea was severe with AHI of 65.6.  She had significant oxygen desaturation to 77%.  She previously had seen [name] who had retired.  Presently she does admit to shortness of breath when walking up steps.  She had undergone PFTs.  She does Pilates at least 2 times per week.  She has issues with the extensive fusion of her back of C5-S1 and has some hip problems which she also sees Dr. Darrick Penna.  She continues to be on low-dose amlodipine at 2.5 mg for blood pressure control.  A calcium score May 18, 2019 was 83 agaston and units representing 81 percentile for age and sex matched control.  She is on atorvastatin at 10 mg.  LDL  cholesterol in October 2022 was 82 and she is followed by Dr. Darcus Austin.  She presents for follow-up cardiology evaluation.  Past Medical History:  Diagnosis Date   Anemia    Arthritis    osteo   Atrial tachycardia    x 10 years ago wias diagnosed with short runs of atrial tachycardia while wearing a heart monitor.  No problems since.   CAD (coronary artery disease)    noted on coronary CTA in 05/2019   Chronic dyspnea    Complication of anesthesia    urinary retention requiring indwelling  foley catheterization   DDD (degenerative disc disease)    Ehlers-Danlos syndrome type III    Fibromyalgia    History of blood transfusion    Hyperlipidemia    Hypertension    Osteoarthritis    Sleep apnea, obstructive    Thoracic outlet syndrome 07/2020   per patient     Past Surgical History:  Procedure Laterality Date   BACK SURGERY     Cervical fusion C5-7, fusion  T10-S1   DIAGNOSTIC LAPAROSCOPY     x 3   EYE SURGERY     bilateral tear duct fusion   JOINT REPLACEMENT     partial left knee 2005, total left knee 2012   KNEE ARTHROPLASTY     KNEE ARTHROSCOPY  08/06/2012   Procedure: ARTHROSCOPY KNEE;  Surgeon: Loanne Drilling, MD;  Location: [facility];  Service: Orthopedics;  Laterality: Left;  WITH SYNOVECTOMY and debridement   OSTEOTOMY     bilateral tibial    Current Medications: Outpatient Medications Prior to Visit  Medication Sig Dispense Refill   amLODipine (NORVASC) 2.5 MG tablet Take 1 tablet (2.5 mg total) by mouth daily. 90 tablet 2   atorvastatin (LIPITOR) 10 MG tablet TAKE 1 TABLET BY MOUTH EVERY DAY 90 tablet 3   Calcium Carbonate-Vitamin D (CALTRATE 600+D PO) Take 1 tablet by mouth daily.      Cholecalciferol (VITAMIN D3) 2000 units capsule Take 2,000 Units by mouth daily.     dextroamphetamine (DEXTROSTAT) 5 MG tablet Take 5 mg by mouth as needed.     estradiol (ESTRACE) 0.1 MG/GM vaginal cream Place 1 Applicatorful vaginally at bedtime.     gabapentin (NEURONTIN)  300 MG capsule TAKE 1 CAP IN AM, 1 CAP MIDDAY, AND 2 CAPS AT BEDTIME 120 capsule 3   meloxicam (MOBIC) 15 MG tablet Take 1 tablet (15 mg total) by mouth daily. 30  tablet 5   methocarbamol (ROBAXIN) 750 MG tablet TAKE 1 TABLET (750 MG TOTAL) BY MOUTH EVERY DAY AT BEDTIME AS NEEDED FOR MUSCLE SPASM 30 tablet 2   polyethylene glycol powder (GLYCOLAX/MIRALAX) 17 GM/SCOOP powder Take 1 Container by mouth as needed.     Probiotic Product (PROBIOTIC PO) Take 1 capsule by mouth daily.     zolpidem (AMBIEN) 5 MG tablet TAKE 1 TABLET BY MOUTH AT BEDTIME AS NEEDED FOR SLEEP 30 tablet 2   nitroGLYCERIN (NITRODUR - DOSED IN MG/24 HR) 0.2 mg/hr patch Use 1/4 patch daily to the affected area. 30 patch 1   No facility-administered medications prior to visit.     Allergies:   Patient has no known allergies.   Social History   Socioeconomic History   Marital status: Married    Spouse name: [name]   Number of children: 3   Years of education: BA   Highest education level: Not on file  Occupational History    Employer: [employer]    Comment: [employer]   Tobacco Use   Smoking status: Never    Passive exposure: Never   Smokeless tobacco: Never  Vaping Use   Vaping Use: Never used  Substance and Sexual Activity   Alcohol use: Yes    Comment: 3 glasses of wine weekly   Drug use: No   Sexual activity: Not on file  Other Topics Concern   Not on file  Social History Narrative   Patient lives at home with family.   Caffeine Use: 4-5 sodas weekly   Social Determinants of Health   Financial Resource Strain: Not on file  Food Insecurity: Not on file  Transportation Needs: Not on file  Physical Activity: Not on file  Stress: Not on file  Social Connections: Not on file     Family History:  The patient's family history includes Anorexia nervosa in her sister; Atrial fibrillation in her father; Cancer - Colon in her paternal grandfather and paternal grandmother; Cancer - Prostate in her  father; Congestive Heart Failure in her father; Healthy in her daughter, daughter, and daughter; Heart attack in her maternal grandmother; Hypertension in her father; Stroke in her maternal grandfather and mother.  She believes her mother most likely had Erler's Danlos and sister also has Erler's Danlos syndrome.  ROS General: Negative; No fevers, chills, or night sweats;  HEENT: Negative; No changes in vision or hearing, sinus congestion, difficulty swallowing Pulmonary: Negative; No cough, wheezing, shortness of breath, hemoptysis Cardiovascular: See HPI GI: Negative; No nausea, vomiting, diarrhea, or abdominal pain GU: Negative; No dysuria, hematuria, or difficulty voiding Musculoskeletal: Significant joint issues with multiple back surgeries and ultimate complete fusion of her back from T1-S1 done at [facility] Hematologic/Oncology: Negative; no easy bruising, bleeding Endocrine: Negative; no heat/cold intolerance; no diabetes Neuro: Negative; no changes in balance, headaches Skin: Negative; No rashes or skin lesions Psychiatric: Negative; No behavioral problems, depression Sleep: Positive for OSA and has been on CPAP therapy evaluated by [name]. no daytime sleepiness, hypersomnolence, bruxism, restless legs, hypnogognic hallucinations, no cataplexy Other comprehensive 14 point system review is negative.   PHYSICAL EXAM:   VS:  BP 126/72 (BP Location: Left Arm, Patient Position: Sitting, Cuff Size: Normal)   Pulse 67   Ht  (1.651 m)   Wt 135 lb (61.2 kg)   BMI 22.47 kg/m     Repeat blood pressure by me was 112/70  Wt Readings from Last 3 Encounters:  06/11/22 135 lb (  61.2 kg)  05/15/22 130 lb (59 kg)  02/06/22 130 lb (59 kg)    General: Alert, oriented, no distress.  Skin: normal turgor, no rashes, warm and dry HEENT: Normocephalic, atraumatic. Pupils equal round and reactive to light; sclera anicteric; extraocular muscles intact; Nose without nasal septal  hypertrophy Mouth/Parynx benign; Mallinpatti scale 2 Neck: No JVD, no carotid bruits; normal carotid upstroke Lungs: clear to ausculatation and percussion; no wheezing or rales Chest wall: without tenderness to palpitation Heart: PMI not displaced, RRR, s1 s2 normal, 1/6 systolic murmur, no diastolic murmur, no rubs, gallops, thrills, or heaves Abdomen: soft, nontender; no hepatosplenomehaly, BS+; abdominal aorta nontender and not dilated by palpation. Back: no CVA tenderness Pulses 2+ Musculoskeletal: full range of motion, normal strength, no joint deformities Extremities: no clubbing cyanosis or edema, Homan's sign negative  Neurologic: grossly nonfocal; Cranial nerves grossly wnl Psychologic: Normal mood and affect   Studies/Labs Reviewed:   June 11, 2022 ECG (independently read by me): NSR at 67, PRWP V1-3  March 09, 2020  ECG (independently read by me): NSR at 60; no ST changes, normal intervals    September 10, 2019  ECG (independently read by me): Normal sinus rhythm at 64 bpm.  QS complex V1 V2.  Nonspecific T wave abnormality in lead III.  Low voltage.  No ectopy.  June 01, 2019 ECG (independently read by me): Sinus bradycardia 58 bpm.  QS complex V1 V2 V3, not significantly changed from her ER tracing of May 18, 2019.  Normal intervals.  No ectopy  Recent Labs:    Latest Ref Rng & Units 01/31/2022   10:00 AM 08/03/2021    1:54 PM 03/24/2021    3:33 PM  BMP  Glucose 65 - 99 mg/dL 71  161  63   BUN 7 - 25 mg/dL Creatinine 0.50 - 1.05 mg/dL 0.96  0.45  4.09   BUN/Creat Ratio 6 - 22 (calc) NOT APPLICABLE  NOT APPLICABLE  20   Sodium 135 - 146 mmol/L 145  142  142   Potassium 3.5 - 5.3 mmol/L 4.6  4.2  3.8   Chloride 98 - 110 mmol/L 107  104  99   CO2 20 - 32 mmol/L 32  33  28   Calcium 8.6 - 10.4 mg/dL 9.1  8.9  9.1         Latest Ref Rng & Units 01/31/2022   10:00 AM 08/03/2021    1:54 PM 02/02/2021    1:40 PM  Hepatic Function  Total Protein  6.1 - 8.1 g/dL 6.6  5.9  6.7   AST 10 - 35 U/L ALT 6 - 29 U/L 19  14  38   Total Bilirubin 0.2 - 1.2 mg/dL 0.5  0.4  0.6        Latest Ref Rng & Units 01/31/2022   10:00 AM 08/03/2021    1:54 PM 02/02/2021    1:40 PM  CBC  WBC 3.8 - 10.8 Thousand/uL 4.6  5.4  6.3   Hemoglobin 11.7 - 15.5 g/dL 81.1  91.4  78.2   Hematocrit 35.0 - 45.0 % 38.1  35.2  40.1   Platelets 140 - 400 Thousand/uL 178  197  210    Lab Results  Component Value Date   MCV 88.0 01/31/2022   MCV 86.5 08/03/2021   MCV 88.5 02/02/2021   No results found for: "TSH" No results found for: "  HGBA1C"   BNP No results found for: "BNP"  ProBNP    Component Value Date/Time   PROBNP 67 03/24/2021 1533     Lipid Panel     Component Value Date/Time   CHOL 168 03/07/2020 0806   TRIG 70 03/07/2020 0806   HDL 92 03/07/2020 0806   CHOLHDL 1.8 03/07/2020 0806   CHOLHDL 2.8 05/19/2019 0504   VLDL 10 05/19/2019 0504   LDLCALC 63 03/07/2020 0806   LABVLDL 13 03/07/2020 0806     RADIOLOGY: No results found.    Additional studies/ records that were reviewed today include:   ECHO 05/18/2019 IMPRESSIONS    1. Left ventricular ejection fraction, by visual estimation, is 65 to 70%. The left ventricle has hyperdynamic function. There is no left ventricular hypertrophy.  2. Global right ventricle has normal systolic function.The right ventricular size is normal. No increase in right ventricular wall thickness.  3. Left atrial size was normal.  4. Right atrial size was normal.  5. The mitral valve is normal in structure. Trace mitral valve regurgitation. No evidence of mitral stenosis.  6. The tricuspid valve is normal in structure. Tricuspid valve regurgitation is trivial.  7. The aortic valve is normal in structure. Aortic valve regurgitation is not visualized. No evidence of aortic valve sclerosis or stenosis.  8. The pulmonic valve was normal in structure. Pulmonic valve regurgitation is not  visualized.  9. The inferior vena cava is normal in size with greater than 50% respiratory variability, suggesting right atrial pressure of 3 mmHg.    CORONARY CTA IMPRESSION: 05/19/2019 1. Coronary calcium score of 38. This was 29 percentile for age and sex matched control.   2. Normal coronary origin with right dominance.   3. CAD-RADS 1. Minimal non-obstructive CAD (0-24%). Consider non-atherosclerotic causes of chest pain. Consider preventive therapy and risk factor modification.    ASSESSMENT:    1. Primary hypertension   2. OSA (obstructive sleep apnea)   3. Ehlers-Danlos syndrome   4. Hyperlipidemia with target LDL less than 70   5. Coronary artery calcification     PLAN:  Nicole Oneill is a very pleasant 61 year old female who has a history of Erler's Danlos type III and has had numerous orthopedic issues contributed by hypermobility as well as disc disease.  She developed an episode of severe substernal chest tightness associated with left arm radiation, diaphoresis, nausea and vomiting worrisome for potential ischemic etiology leading to a hospital evaluation in November 2020.  Overnight hospitalization revealed a minimally increased flat high-sensitivity troponin curve more suggestive of possible demand ischemia rather than ACS.  However, it appears that her symptoms first occurred when she was leaning over doing a Pilates move and lifting raising some concern for possible transient vasomotor spasm contributing to her symptomatology.  Her echo Doppler study revealed hyperdynamic LV function without wall motion abnormalities. There was no evidence for apical ballooning or apical hypocontractility arguing against Takotsubo cardiomyopathy.  CTA demonstrated mild coronary calcification involving the ostium of her RCA and her calcium score was mildly increased at 38.  When I saw her in July 2020 I recommended aggressive lipid-lowering therapy.  I further titrated her rosuvastatin and  apparently after 2 weeks this was changed to atorvastatin 10 mg daily.  At my last evaluation she had been on atorvastatin 10 mg with markedly improved LDL cholesterol down to 48 compared to 133.  I have not seen her in over 2-1/2 years.  She had been followed by [name]  for sleep apnea and was reevaluated on June 05, 2021 by Dr. Craige CottaSood which confirmed moderate overall sleep apnea however this was severe with supine sleep and she had significant oxygen desaturation to a nadir of 77%.  She continues to be active doing Pilates at least 2 times per week and denies any chest pain.  However she has noticed some shortness of breath when walking up steps.  She apparently had undergone PFT evaluation.  I have recommended target LDL less than 70 and laboratory done in October 2022 by Dr. Waynard EdwardsPerini was [id number].  She does have some mobility issues with back fusion from C5-S1 and has had some resulting hip discomfort problems.  She will be undergoing repeat laboratory and I have recommended that atorvastatin be titrated if she is not at target LDL less than 70.  Her blood pressure today is stable.  I will see her in 1 year for reevaluation.   Medication Adjustments/Labs and Tests Ordered: Current medicines are reviewed at length with the patient today.  Concerns regarding medicines are outlined above.  Medication changes, Labs and Tests ordered today are listed in the Patient Instructions below. Patient Instructions  Medication Instructions:  If LDL from PCP blood work is greater than 70 INCREASE Atorvastatin (Lipitor) to 20 mg daily  *If you need a refill on your cardiac medications before your next appointment, please call your pharmacy*  Lab Work: NONE ordered at this time of appointment   If you have labs (blood work) drawn today and your tests are completely normal, you will receive your results only by: MyChart Message (if you have MyChart) OR A paper copy in the mail If you have any lab test that is  abnormal or we need to change your treatment, we will call you to review the results.  Testing/Procedures: NONE ordered at this time of appointment   Follow-Up: At [facility], you and your health needs are our priority.  As part of our continuing mission to provide you with exceptional heart care, we have created designated Provider Care Teams.  These Care Teams include your primary Cardiologist (physician) and Advanced Practice Providers (APPs -  Physician Assistants and Nurse Practitioners) who all work together to provide you with the care you need, when you need it.  Your next appointment:   1 year(s)  The format for your next appointment:   In Person  Provider:   Nicki Guadalajarahomas Kjirsten Bloodgood, MD     Other Instructions  Important Information About Sugar         Signed, Nicki Guadalajarahomas Savaya Hakes, MD  06/18/2022 3:06 PM    [facility] [address], [address], [place], [place]  [address] Phone: [phone]

## 2022-06-18 ENCOUNTER — Encounter: Payer: Self-pay | Admitting: Cardiovascular Disease

## 2022-06-19 ENCOUNTER — Other Ambulatory Visit: Payer: Self-pay | Admitting: *Deleted

## 2022-06-19 IMAGING — MG DIGITAL SCREENING BILAT W/ TOMO W/ CAD
8 series · 9 of 24 positions shown · non-contrast
Comparison: Previous exam(s).

CLINICAL DATA: Screening.

EXAM:
DIGITAL SCREENING BILATERAL MAMMOGRAM WITH TOMO AND CAD

[R CC synth-2D]
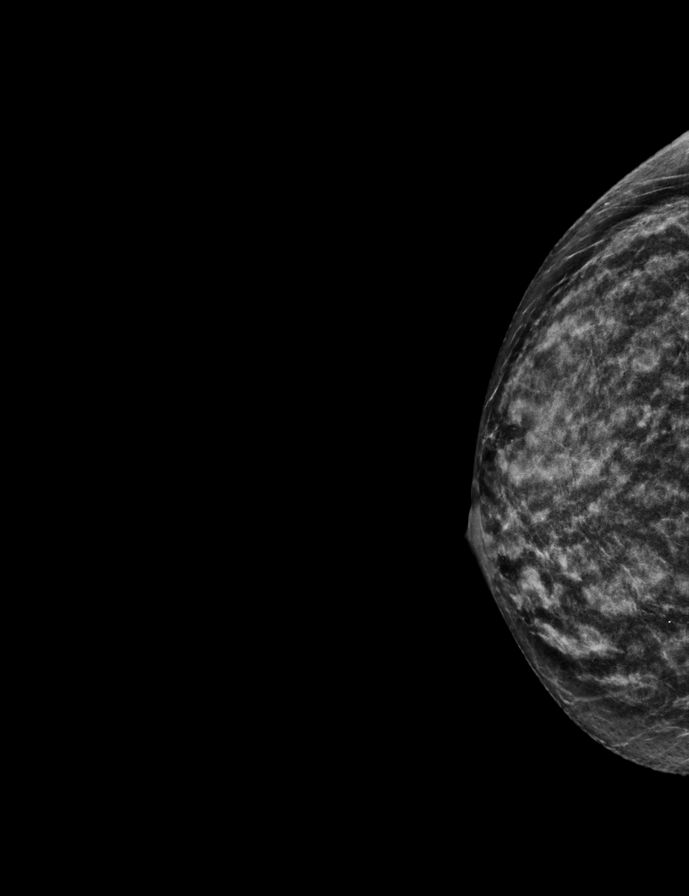

[L MLO synth-2D]
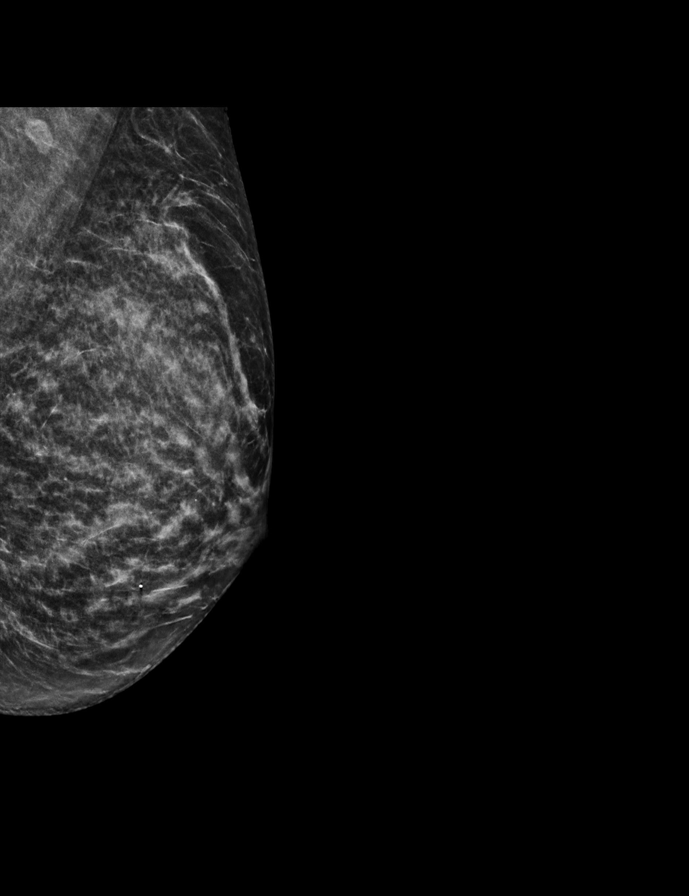

[R MLO synth-2D]
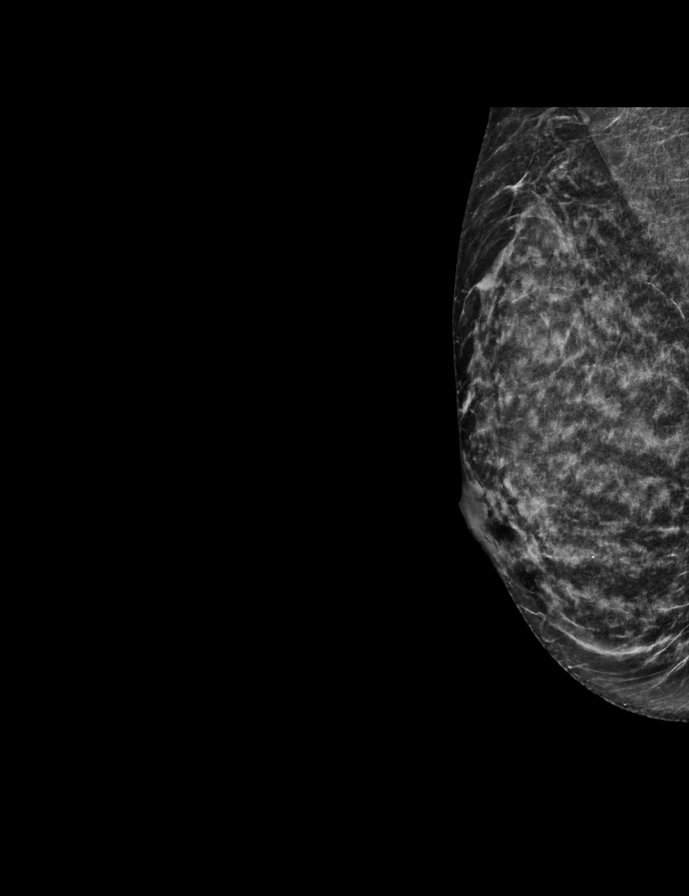

[L CC synth-2D]
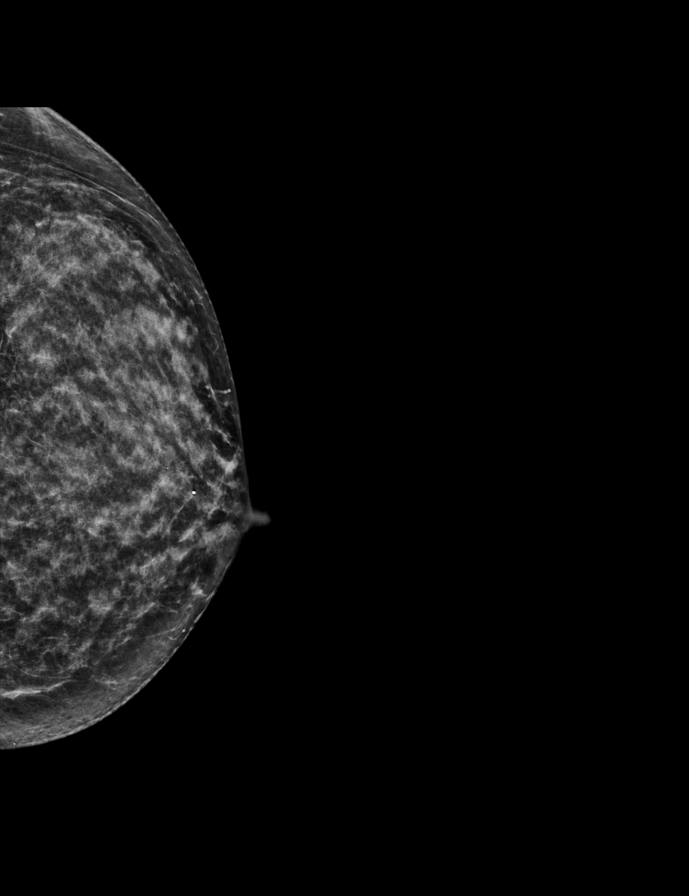

[L CC tomo · 2 of 49 frames shown]
[frame 16/49]
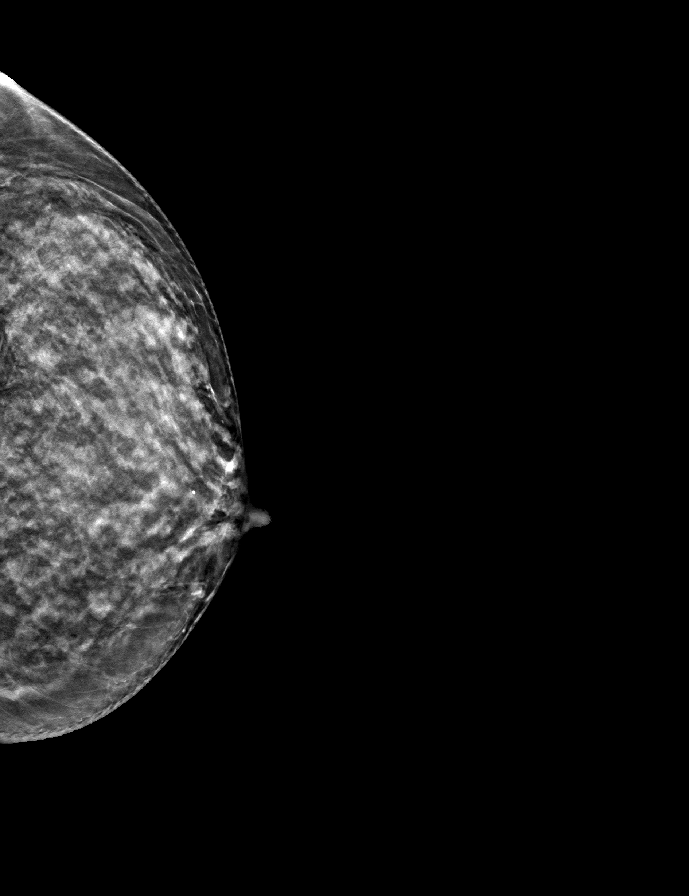
[frame 25/49]
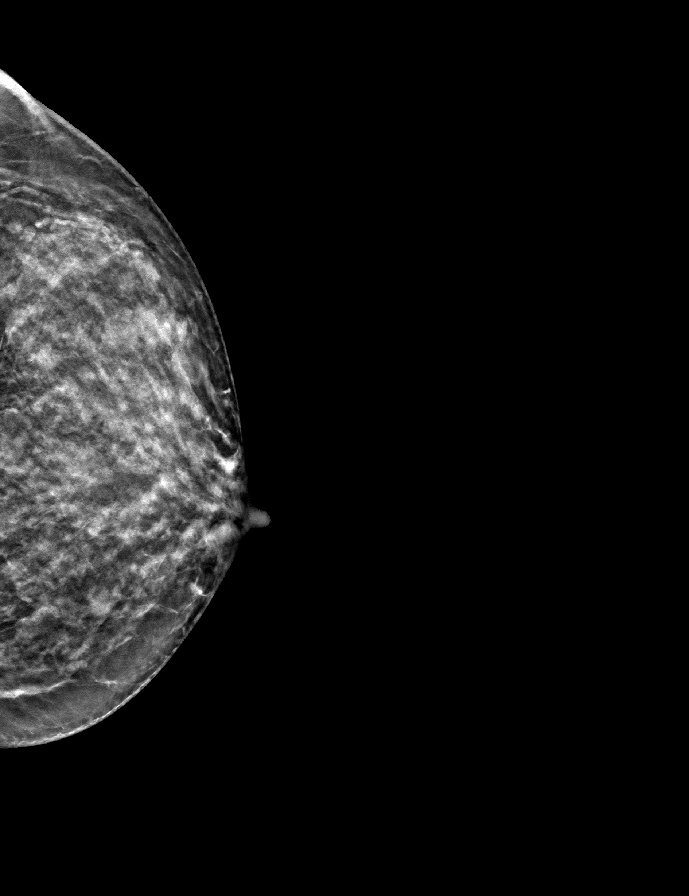

[R CC tomo · tomo slice 23/46.0]
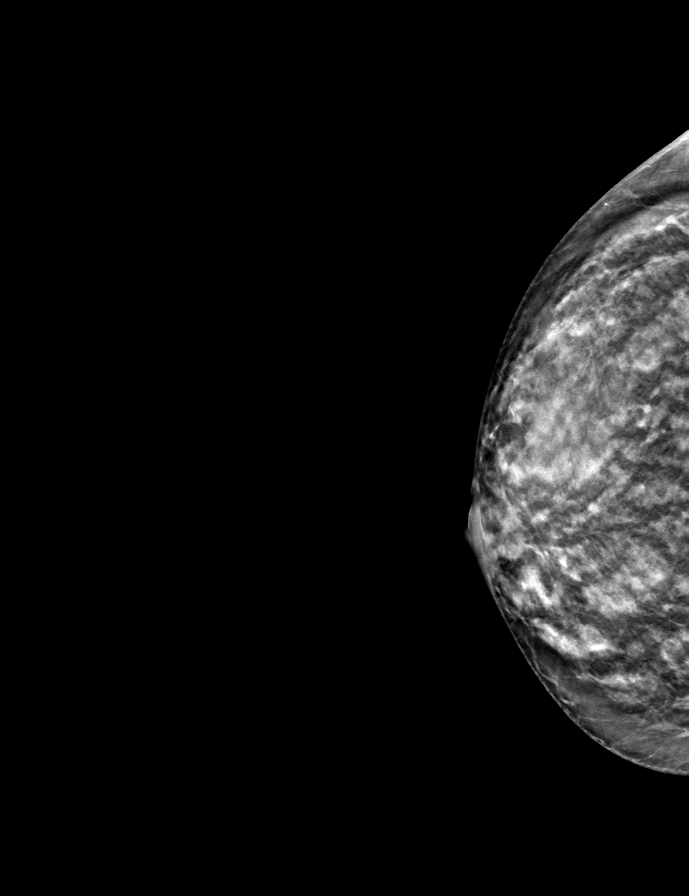

[R MLO tomo · tomo slice 24/47.0]
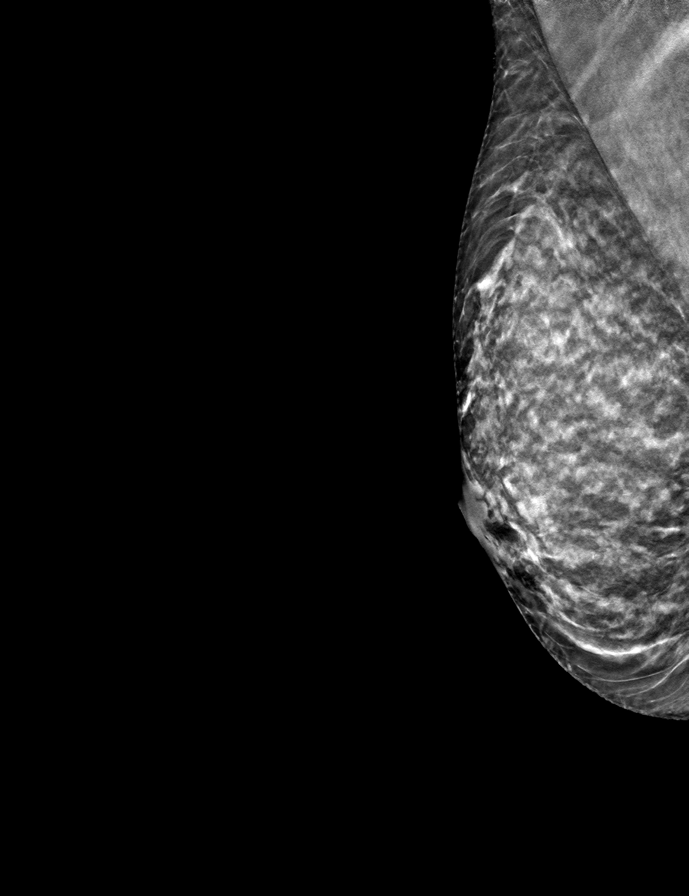

[L MLO tomo · tomo slice 25/48.0]
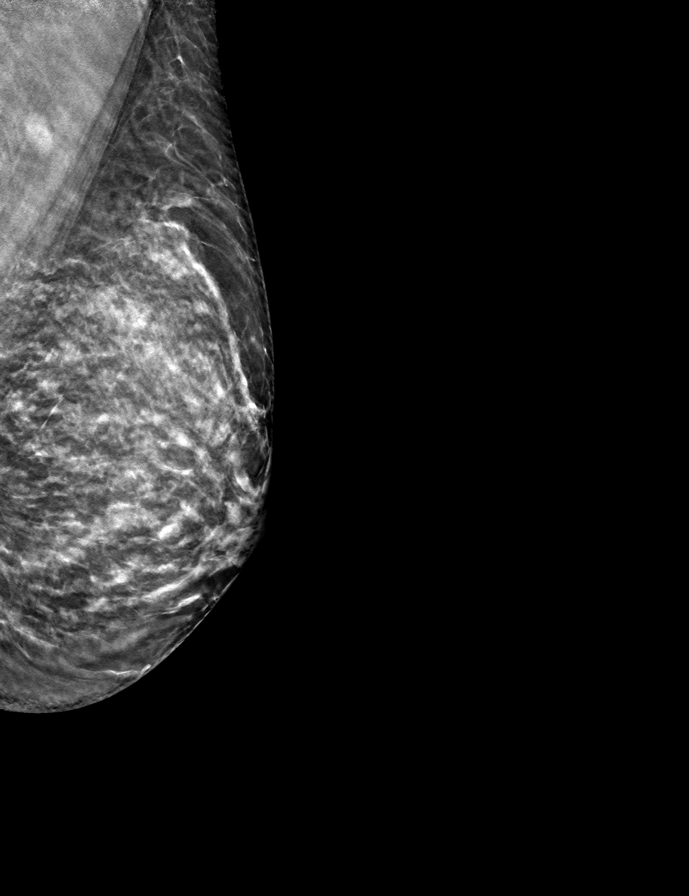

[9 of 24 positions shown; findings below may reference images not displayed]

ACR Breast Density Category c: The breast tissue is heterogeneously
dense, which may obscure small masses.
FINDINGS: There are no findings suspicious for malignancy. Images were
processed with CAD.
IMPRESSION: No mammographic evidence of malignancy. A result letter of this
screening mammogram will be mailed directly to the patient.

RECOMMENDATION:
Screening mammogram in one year. (Code:[id number]-U-LHB)

BI-RADS CATEGORY  1: Negative.

## 2022-06-19 MED ORDER — METHOCARBAMOL 750 MG PO TABS: ORAL_TABLET | ORAL | 2 refills | Status: DC

## 2022-06-19 NOTE — Telephone Encounter (Signed)
Patient called the office requesting a refill on Methocarbamol to [facility]  Next Visit: 08/08/2022  Last Visit: 01/31/2022   Last Fill: 03/05/2022  Dx: DDD (degenerative disc disease), cervical    Current Dose per office note on 01/31/2022: not discussed  Okay to refill Methocarbamol?

## 2022-06-19 NOTE — Addendum Note (Signed)
Addended by: Henriette Combs on: 06/19/2022 04:28 PM   Modules accepted: Orders

## 2022-07-10 ENCOUNTER — Ambulatory Visit: Payer: 59 | Admitting: Sports Medicine

## 2022-07-10 VITALS — BP 104/68 | Ht 64.5 in | Wt 135.0 lb

## 2022-07-10 DIAGNOSIS — G5622 Lesion of ulnar nerve, left upper limb: Secondary | ICD-10-CM | POA: Diagnosis not present

## 2022-07-10 DIAGNOSIS — M5416 Radiculopathy, lumbar region: Secondary | ICD-10-CM | POA: Diagnosis not present

## 2022-07-10 DIAGNOSIS — G5621 Lesion of ulnar nerve, right upper limb: Secondary | ICD-10-CM

## 2022-07-10 DIAGNOSIS — Q7962 Hypermobile Ehlers-Danlos syndrome: Secondary | ICD-10-CM | POA: Diagnosis not present

## 2022-07-10 MED ORDER — NORTRIPTYLINE HCL 25 MG PO CAPS: 25.0000 mg | ORAL_CAPSULE | Freq: Every day | ORAL | 2 refills | Status: DC

## 2022-07-10 NOTE — Assessment & Plan Note (Signed)
This is not as severe as last visit.  GB helps sleep but unsure if it is helping elbow sxs  Continue to avoid hyperextension in pilates and other activity

## 2022-07-10 NOTE — Progress Notes (Signed)
Established Patient Office Visit  Subjective   Patient ID: Nicole Oneill, female    DOB: September 22, 1960  Age: 62 y.o. 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  GB helps sleep but unsure if it is helping elbow sxs  Continue to avoid hyperextension in pilates and other activity      Relevant Medications   nortriptyline (PAMELOR) 25 MG capsule     Other   Ehlers-Danlos syndrome type III    With multiple issues with joint of muscle pain we will try low dose nortriptyline at HS to see if this lessens her pain Cymbalta really helped but had to stop for GI sideeffects  Also plans another trial of dry needling for painful trap and other paraspinal mm      Chronic lumbar radiculopathy - Primary   Relevant Medications   nortriptyline (PAMELOR) 25 MG capsule   Ulnar neuropathy of left upper extremity   Relevant Medications   nortriptyline (PAMELOR) 25 MG capsule    Routine f/u q 3 mos   Stefanie Libel, MD   Established Patient Office Visit  Subjective   Patient ID: Nicole Oneill, female    DOB: February 11, 1961  Age: 62 y.o. MRN: [id number]  CC: Severe chronic back pain, L elbow pain, L shoulder pain, R elbow pain   Mrs. Keaghan Bowens is a 62 year old female with EDS who presents for follow-up of left elbow pain. Currently back pain is her worst problem, feels like it is "burning/screaming." Recently returned from a trip to [facility], walked a lot, slept on different mattress, and flew, which have contributed to pain in the  past.   Decreased L shoulder exercises due to them aggravating left elbow. Intermittent tingling in left and right elbow from activity like Pilates. Left elbow pain started after lifting heavy item, no clear trigger for right elbow pain which has been ongoing for months. Tingling radiates downward towards wrist.   Gabapentin helps with sleep but unsure if it helps pain. Tried Cymbalta in the past which was helpful but discontinued due to constipation.    Objective:     BP 104/68   Ht 5' 4.5" (1.638 m)   Wt 135 lb (61.2 kg)   BMI 22.81 kg/m    Physical Exam  Vitals reviewed.  Constitutional General: Pleasant, normal appearance. Occasionally appears uncomfortable secondary to back pain.   L shoulder: Full ROM with pain from forward flexion. Intact external rotation against resistance; full rotator cuff testing and biceps testing is wnl.  L elbow: Mild tenderness at cubital tunnel   R elbow: No tenderness   Cervical spine: Decreased lateral range of motion. Spasm left trapezius/  ML scar down spine.    Assessment & Plan:  See problem list   Stefani Dama, Medical Student  I observed and examined the  patient with the Medical student and agree with assessment and plan.  Note reviewed and modified by me.  Present for entire exam.  Ila Mcgill, MD

## 2022-07-10 NOTE — Assessment & Plan Note (Signed)
With multiple issues with joint of muscle pain we will try low dose nortriptyline at HS to see if this lessens her pain Cymbalta really helped but had to stop for GI sideeffects  Also plans another trial of dry needling for painful trap and other paraspinal mm

## 2022-07-12 ENCOUNTER — Other Ambulatory Visit: Payer: Self-pay

## 2022-07-12 ENCOUNTER — Ambulatory Visit: Payer: BC Managed Care – PPO | Admitting: Sports Medicine

## 2022-07-12 MED ORDER — ZOLPIDEM TARTRATE 5 MG PO TABS: ORAL_TABLET | ORAL | 0 refills | Status: DC

## 2022-07-12 NOTE — Telephone Encounter (Signed)
Patient called and states her pharmacy changed based on insurance changing. She now uses [facility] on [address] and requested a new prescription for [employer].   Next Visit: 08/08/2022  Last Visit: 01/31/2022  Last Fill: 05/23/2022  DX: Primary insomnia   Current Dose per office note on 01/31/2022: Ambien 5 mg 1 tablet by mouth at bedtime as needed for insomnia.   Okay to refill ambien?

## 2022-07-23 ENCOUNTER — Other Ambulatory Visit: Payer: Self-pay | Admitting: Sports Medicine

## 2022-07-26 NOTE — Progress Notes (Signed)
Office Visit Note  Patient: Nicole Oneill             Date of Birth: 1961-02-14           MRN: [id number]             PCP: [name], MD Referring: [name], MD Visit Date: 08/08/2022 Occupation: @GUAROCC @  Subjective:  Generalized pain  History of Present Illness: Nicole Oneill is a 62 y.o. female history of osteoarthritis, degenerative disc disease and EDS.  She states that she tried to back off on meloxicam but due to increased pain she started taking meloxicam 15 mg p.o. daily only on as needed basis.  She has been followed closely by Dr. 62.  She continues to have pain and discomfort in her neck, upper back and lower back.  She has had epidural injections in the past which were helpful.  She continues to have insomnia.  She tried to cut back on Ambien to 2.5 mg but it does not help.  She has been taking Robaxin as needed for muscle spasms.  She will be getting repeat DEXA scan through Dr. Darrick Penna.    Activities of Daily Living:  Patient reports morning stiffness for 1 hour.   Patient Reports nocturnal pain.  Difficulty dressing/grooming: Denies Difficulty climbing stairs: Denies Difficulty getting out of chair: Denies Difficulty using hands for taps, buttons, cutlery, and/or writing: Reports  Review of Systems  Constitutional:  Positive for fatigue.  HENT:  Positive for mouth dryness. Negative for mouth sores.   Eyes:  Positive for dryness.  Respiratory:  Negative for shortness of breath.   Cardiovascular:  Negative for chest pain and palpitations.  Gastrointestinal:  Positive for constipation. Negative for blood in stool and diarrhea.  Endocrine: Negative for increased urination.  Genitourinary:  Negative for involuntary urination.  Musculoskeletal:  Positive for joint pain, joint pain, joint swelling, myalgias, muscle weakness, morning stiffness, muscle tenderness and myalgias. Negative for gait problem.  Skin:  Negative for color change, rash, hair loss and  sensitivity to sunlight.  Allergic/Immunologic: Negative for susceptible to infections.  Neurological:  Negative for dizziness and headaches.  Hematological:  Negative for swollen glands.  Psychiatric/Behavioral:  Positive for sleep disturbance. Negative for depressed mood. The patient is not nervous/anxious.     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 02/06/2022   Nonobstructive CAD 05/04/2021   Hypertension 05/04/2021   Hyperlipidemia 05/04/2021   Shortness of breath 04/04/2021   OSA on CPAP 04/04/2021   Right shoulder pain 02/19/2019   DDD (degenerative disc disease), cervical 04/28/2018   DDD (degenerative disc disease), lumbar 10/29/2017   Sicca syndrome, unspecified 10/29/2017   Left anterior shoulder pain 11/27/2016   History of lumbar fusion 10/16/2016   History of fusion of cervical spine 10/16/2016   History of total knee arthroplasty, left 2012 10/16/2016   Primary insomnia 10/16/2016   DDD lumbar 10/15/2016   DJD (degenerative joint disease), cervical 10/15/2016   DDD (degenerative disc disease), thoracic 10/15/2016   Primary osteoarthritis of both knees 10/15/2016   Tendinopathy of right gluteus medius 05/29/2016   Chronic lumbar radiculopathy 03/08/2016   Heart murmur, systolic 06/19/2014   Disorder of SI (sacroiliac) joint 06/02/2014   Functional gait abnormality 06/02/2014   Ehlers-Danlos syndrome type III 03/10/2014   Foot  arch pain 03/10/2014   Left knee pain 05/26/2013   Primary osteoarthritis of both hands 05/26/2013   Effusion of knee joint, left 08/05/2012   Osteoarthritis of multiple joints 10/12/2010    Past Medical History:  Diagnosis Date   Anemia    Arthritis    osteo   Atrial tachycardia    x 10 years ago wias diagnosed with short runs of atrial tachycardia while wearing  a heart monitor.  No problems since.   CAD (coronary artery disease)    noted on coronary CTA in 05/2019   Chronic dyspnea    Complication of anesthesia    urinary retention requiring indwelling  foley catheterization   DDD (degenerative disc disease)    Ehlers-Danlos syndrome type III    Fibromyalgia    History of blood transfusion    Hyperlipidemia    Hypertension    Osteoarthritis    Sleep apnea, obstructive    Thoracic outlet syndrome 07/2020   per patient     Family History  Problem Relation Age of Onset   Stroke Mother        hemorrhagic   Hypertension Father    Cancer - Prostate Father    Atrial fibrillation Father    Congestive Heart Failure Father    Anorexia nervosa Sister    Heart attack Maternal Grandmother    Stroke Maternal Grandfather    Cancer - Colon Paternal Grandmother    Cancer - Colon Paternal Grandfather    Healthy Daughter    Healthy Daughter    Healthy Daughter    Past Surgical History:  Procedure Laterality Date   BACK SURGERY     Cervical fusion C5-7, fusion  T10-S1   DIAGNOSTIC LAPAROSCOPY     x 3   EYE SURGERY     bilateral tear duct fusion   JOINT REPLACEMENT     partial left knee 2005, total left knee 2012   KNEE ARTHROPLASTY     KNEE ARTHROSCOPY  08/06/2012   Procedure: ARTHROSCOPY KNEE;  Surgeon: Gearlean Alf, MD;  Location: [facility];  Service: Orthopedics;  Laterality: Left;  WITH SYNOVECTOMY and debridement   OSTEOTOMY     bilateral tibial   Social History   Social History Narrative   Patient lives at home with family.   Caffeine Use: 4-5 sodas weekly   Immunization History  Administered Date(s) Administered   Influenza Inj Mdck Quad Pf 04/22/2017   Influenza,inj,quad, With Preservative 04/08/2017   Moderna Sars-Covid-2 Vaccination 10/11/2019, 11/06/2019, 10/08/2020   PFIZER(Purple Top)SARS-COV-2 Vaccination 06/09/2020   Pfizer Covid-19 Vaccine Bivalent Booster 51yrs & up 03/22/2021     Objective: Vital Signs: BP  105/66 (BP Location: Left Arm, Patient Position: Sitting, Cuff Size: Normal)   Pulse 67   Resp 15   Ht 5\' 5"  (1.651 m)   Wt 134 lb (60.8 kg)   BMI 22.30 kg/m    Physical Exam Vitals and nursing note reviewed.  Constitutional:      Appearance: She is well-developed.  HENT:     Head: Normocephalic and atraumatic.  Eyes:     Conjunctiva/sclera: Conjunctivae normal.  Cardiovascular:     Rate and Rhythm: Normal rate and regular rhythm.     Heart sounds: Normal heart sounds.  Pulmonary:     Effort: Pulmonary effort is normal.     Breath sounds: Normal breath sounds.  Abdominal:     General: Bowel sounds are normal.     Palpations: Abdomen is soft.  Musculoskeletal:  Cervical back: Normal range of motion.  Lymphadenopathy:     Cervical: No cervical adenopathy.  Skin:    General: Skin is warm and dry.     Capillary Refill: Capillary refill takes less than 2 seconds.  Neurological:     Mental Status: She is alert and oriented to person, place, and time.  Psychiatric:        Behavior: Behavior normal.      Musculoskeletal Exam: She had limited range of motion of the cervical spine.  She had limited range of motion of thoracic and lumbar spine which is fused.  Shoulders, elbows, wrist joints were in good range of motion.  She had bilateral CMC subluxation and PIP and DIP thickening.  Hip joints were in good range of motion.  Left knee joint is replaced.  There was no tenderness over ankles or MTPs.  CDAI Exam: CDAI Score: -- Patient Global: --; Provider Global: -- Swollen: --; Tender: -- Joint Exam 08/08/2022   No joint exam has been documented for this visit   There is currently no information documented on the homunculus. Go to the Rheumatology activity and complete the homunculus joint exam.  Investigation: No additional findings.  Imaging: No results found.  Recent Labs: Lab Results  Component Value Date   WBC 4.6 01/31/2022   HGB 12.5 01/31/2022   PLT 178  01/31/2022   NA 145 01/31/2022   K 4.6 01/31/2022   CL 107 01/31/2022   CO2 32 01/31/2022   GLUCOSE 71 01/31/2022   BUN 17 01/31/2022   CREATININE 0.80 01/31/2022   BILITOT 0.5 01/31/2022   ALKPHOS 65 03/07/2020   AST 18 01/31/2022   ALT 19 01/31/2022   PROT 6.6 01/31/2022   ALBUMIN 4.5 03/07/2020   CALCIUM 9.1 01/31/2022   GFRAA 91 08/04/2020   QFTBGOLDPLUS NEGATIVE 03/21/2021     Speciality Comments: No specialty comments available.  Procedures:  No procedures performed Allergies: Patient has no known allergies.   Assessment / Plan:     Visit Diagnoses: Primary osteoarthritis of both hands -she continues to have pain and discomfort in her bilateral hands.  She has severe osteoarthritis with bilateral CMC left subluxation.  A brisk for bilateral CMC braces was given.  Joint protection muscle strengthening was discussed.  She has been taking meloxicam 15 mg p.o. daily as needed.  She had labs done by Dr. Waynard Edwards recently.  History of arthroplasty of left knee-doing well.  Trochanteric bursitis of both hips-she continues to have off-and-on discomfort in bilateral trochanteric region.  DDD (degenerative disc disease), cervical -she had limited range of motion.  She continues to have discomfort in his cervical spine.  She is followed by Dr. Darrick Penna.  DDD (degenerative disc disease), lumbar -chronic pain.  Status post fusion.  DDD (degenerative disc disease), thoracic -chronic pain.  Status post fusion.  She is followed by Dr. Berline Chough and Dr. Darrick Penna.  She takes Robaxin 750 mg p.o. nightly as needed.  Sicca syndrome (HCC)-she continues to dry mouth and dry eyes.  She states her dry eyes are getting worse with the use of CPAP machine.  Medication management -following labs were reviewed: June 06, 2022 labs from her PCPs office showed WBC 5.7, hemoglobin 13.1, platelets 168, AST 22, ALT 28, creatinine 0.8, TSH normal, LDL 73, vitamin D43.7Plan: CBC with Differential/Platelet,  COMPLETE METABOLIC PANEL WITH GFR  Osteopenia of multiple sites -patient plans to get repeat DEXA scan through her PCP.  Vitamin D deficiency-she is on vitamin D ointment.  Ehlers-Danlos syndrome type III - diagnosed by geneticist.  She is followed by Dr. Oneida Alar.  Other fatigue  Primary insomnia - Ambien 5 mg 1 tablet by mouth at bedtime as needed for insomnia.  History of sleep apnea - she is on CPAP  Orders: Orders Placed This Encounter  Procedures   CBC with Differential/Platelet   COMPLETE METABOLIC PANEL WITH GFR   No orders of the defined types were placed in this encounter.   Face-to-face time spent with patient was 30 minutes. Greater than 50% of time was spent in counseling and coordination of care.  Follow-Up Instructions: Return in about 6 months (around 02/06/2023) for Osteoarthritis.   Bo Merino, MD  Note - This record has been created using Editor, commissioning.  Chart creation errors have been sought, but may not always  have been located. Such creation errors do not reflect on  the standard of medical care.

## 2022-08-08 ENCOUNTER — Encounter: Payer: Self-pay | Admitting: Rheumatology

## 2022-08-08 ENCOUNTER — Ambulatory Visit: Payer: 59 | Attending: Rheumatology | Admitting: Rheumatology

## 2022-08-08 VITALS — BP 105/66 | HR 67 | Resp 15 | Ht 65.0 in | Wt 134.0 lb

## 2022-08-08 DIAGNOSIS — M5030 Other cervical disc degeneration, unspecified cervical region: Secondary | ICD-10-CM

## 2022-08-08 DIAGNOSIS — M5136 Other intervertebral disc degeneration, lumbar region: Secondary | ICD-10-CM

## 2022-08-08 DIAGNOSIS — F5101 Primary insomnia: Secondary | ICD-10-CM

## 2022-08-08 DIAGNOSIS — M7061 Trochanteric bursitis, right hip: Secondary | ICD-10-CM | POA: Diagnosis not present

## 2022-08-08 DIAGNOSIS — Z79899 Other long term (current) drug therapy: Secondary | ICD-10-CM

## 2022-08-08 DIAGNOSIS — M3500 Sicca syndrome, unspecified: Secondary | ICD-10-CM

## 2022-08-08 DIAGNOSIS — M19041 Primary osteoarthritis, right hand: Secondary | ICD-10-CM

## 2022-08-08 DIAGNOSIS — M5134 Other intervertebral disc degeneration, thoracic region: Secondary | ICD-10-CM

## 2022-08-08 DIAGNOSIS — Z96652 Presence of left artificial knee joint: Secondary | ICD-10-CM | POA: Diagnosis not present

## 2022-08-08 DIAGNOSIS — M7062 Trochanteric bursitis, left hip: Secondary | ICD-10-CM

## 2022-08-08 DIAGNOSIS — M8589 Other specified disorders of bone density and structure, multiple sites: Secondary | ICD-10-CM

## 2022-08-08 DIAGNOSIS — Q7962 Hypermobile Ehlers-Danlos syndrome: Secondary | ICD-10-CM

## 2022-08-08 DIAGNOSIS — Z8669 Personal history of other diseases of the nervous system and sense organs: Secondary | ICD-10-CM

## 2022-08-08 DIAGNOSIS — R5383 Other fatigue: Secondary | ICD-10-CM

## 2022-08-08 DIAGNOSIS — E559 Vitamin D deficiency, unspecified: Secondary | ICD-10-CM

## 2022-08-08 DIAGNOSIS — M19042 Primary osteoarthritis, left hand: Secondary | ICD-10-CM

## 2022-08-22 ENCOUNTER — Other Ambulatory Visit: Payer: Self-pay | Admitting: Rheumatology

## 2022-09-18 ENCOUNTER — Other Ambulatory Visit: Payer: Self-pay | Admitting: Rheumatology

## 2022-09-18 NOTE — Telephone Encounter (Signed)
Next Visit: Due July 2024. Message sent to the front to schedule.   Last Visit: 08/08/2022  Last Fill: 06/19/2022 (Robaxin), 08/22/2022 (Ambien)  Dx: Primary insomnia, DDD (degenerative disc disease), thoracic   Current Dose per office note on 08/08/2022: Ambien 5 mg 1 tablet by mouth at bedtime as needed for insomnia. Robaxin 750 mg p.o. nightly as needed.   Okay to refill Ambien and Robaxin?

## 2022-10-18 ENCOUNTER — Other Ambulatory Visit: Payer: Self-pay | Admitting: Rheumatology

## 2022-10-18 NOTE — Telephone Encounter (Signed)
Please schedule patient a follow up visit. Patient due July 2024. Thanks!

## 2022-10-18 NOTE — Telephone Encounter (Signed)
Last Fill: 09/18/2022  Next Visit: Due July 2024. Message sent to the front to schedule.   Last Visit: 08/08/2022   Dx: Primary insomnia   Current Dose per office note on 08/08/2022: Ambien 5 mg 1 tablet by mouth at bedtime as needed for insomnia   Okay to refill Ambien?

## 2022-10-30 ENCOUNTER — Ambulatory Visit: Payer: 59 | Admitting: Sports Medicine

## 2022-11-08 ENCOUNTER — Other Ambulatory Visit: Payer: Self-pay

## 2022-11-08 ENCOUNTER — Ambulatory Visit (INDEPENDENT_AMBULATORY_CARE_PROVIDER_SITE_OTHER): Payer: 59 | Admitting: Sports Medicine

## 2022-11-08 ENCOUNTER — Encounter: Payer: Self-pay | Admitting: Sports Medicine

## 2022-11-08 VITALS — BP 122/74 | Ht 64.5 in | Wt 130.0 lb

## 2022-11-08 DIAGNOSIS — G8929 Other chronic pain: Secondary | ICD-10-CM

## 2022-11-08 DIAGNOSIS — Q7962 Hypermobile Ehlers-Danlos syndrome: Secondary | ICD-10-CM

## 2022-11-08 DIAGNOSIS — M25562 Pain in left knee: Secondary | ICD-10-CM

## 2022-11-08 MED ORDER — COLCHICINE 0.6 MG PO TABS: 0.6000 mg | ORAL_TABLET | Freq: Two times a day (BID) | ORAL | 1 refills | Status: DC

## 2022-11-08 MED ORDER — GABAPENTIN 300 MG PO CAPS: ORAL_CAPSULE | ORAL | 1 refills | Status: DC

## 2022-11-08 NOTE — Progress Notes (Signed)
Nicole Oneill - 62 y.o. female MRN [id number]  Date of birth: 1960-09-15    CHIEF COMPLAINT:   L Knee pain and bilat CMC pain and bilat foot pain    SUBJECTIVE:   HPI:  Pleasant 62 year old female with history of left TKA done in 2012 and Ehlers-Danlos comes to clinic for multiple complaints  Left knee pain -knee has been bothering her for years ever since she had total knee replacement in 2012.  She has constant dull aching pain in the medial aspect of the knee.  She says the knee feels loose and unstable.  She saw Dr. Despina Hick who did her surgery and he did laboratory testing to see if any reaction to components and reassured her that the knee was not loose.  However, she still feels unstable with it.  Bilateral thumb pain -patient with known history of CMC arthritis bilaterally.  She has not been taking anti-inflammatories regularly.  She has tried wearing thumb braces but keeps losing them and she would like new ones today.  Foot pain -pain at the forefoot at the base of the second toes bilaterally for a number of months.  She has orthotics in her shoes.  These are in good condition but fairly old.  She would like to have these looked at today.  ROS:     See HPI  PERTINENT  PMH / PSH FH / / SH:  Past Medical, Surgical, Social, and Family History Reviewed & Updated in the EMR.  Pertinent findings include:  none  OBJECTIVE:   Physical Exam:  Vital signs are reviewed. BP 122/74   Ht 5' 4.5" (1.638 m)   Wt 130 lb (59 kg)   BMI 21.97 kg/m    GEN: Alert and oriented, NAD Pulm: Breathing unlabored PSY: normal mood, congruent affect  MSK: Left knee -no obvious deformity.  No erythema.  No ecchymoses.  She is tender to palpation of the medial joint line.  Nontender palpation lateral joint line.  Full range of motion flexion extension.  5/5 strength throughout.  She has no ligamentous instability with valgus or varus stress.  She does have some anterior tibial translation that is greater  than the contralateral side. The translation occurs with only light force.  Bilateral hands -bossing at the [facility] joints bilaterally.  She is tender to palpation there.  No overlying erythema.  Full range of motion of the thumbs bilaterally.  Feet -bilateral collapse of the longitudinal arch.  Good maintenance of transverse arch bilaterally.  Hindfoot valgus and pronated on the left.  Symmetric heel rise bilaterally.  ULTRASOUND: MSK ultrasound Left knee: Images were obtained both in the transverse and longitudinal plane. Patellar and quadriceps tendons were well visualized with no abnormalities. There is a moderate effusion in the suprapatellar pouch visualized both long and short axis. Hyperechoic hardware visible at both medial and lateral joint line  Impression: Chronic knee effusion  Interpreted by myself and Dr. Darrick Penna on 11/08/2022  ASSESSMENT & PLAN:  1.  Left knee pain -Patient reports a sensation of instability and has an effusion on her exam.  She has received reassurance from her surgeon who did the replacement years ago but she would like a second opinion.  I will refer her to Dr. Blanchie Dessert for second opinion.  I did encourage her to use compression sleeve on left when active and continue to use ice.  2. Bilateral CMC arthritis -Recommend continued use of anti-inflammatories as needed and topical Voltaren gel 4 times per day.  Will give her new thumb braces today.  Can have an injection there in the future if desired  3. Bilateral Foot Pain -Made some adjustments to her orthotics today by adding bilateral small scaphoid pads and a medial heel wedge on the left to to correct her hindfoot valgus and overpronation on that side.  Can follow-up for new custom orthotics as needed  Arvella Nigh, MD PGY-4, Sports Medicine Fellow [facility]  I observed and examined the patient with the [facility] resident and agree with assessment and plan.  Note reviewed and  modified by me. Sterling Big, MD

## 2022-11-08 NOTE — Assessment & Plan Note (Signed)
She has not tolerated or responded that well to most pain medicines Low dose Naltrexone did not help Cymbalta really helped but gave her GI sxs that she could not tolerate Still gets some benefit form Gabapentin Mobic helps but Dr Titus Dubin has discouraged her from taking this daily Feels that she has joint aches and pain daily  We will give her a trial of Colchicine 0.6 mg bid for one month to see if this affects her pain. Ok to use some Mobic intermittently. Reck 1 month to monitor med change

## 2022-11-12 ENCOUNTER — Encounter: Payer: Self-pay | Admitting: Sports Medicine

## 2022-11-16 ENCOUNTER — Other Ambulatory Visit: Payer: Self-pay | Admitting: *Deleted

## 2022-11-16 ENCOUNTER — Other Ambulatory Visit: Payer: Self-pay | Admitting: Rheumatology

## 2022-11-16 NOTE — Telephone Encounter (Signed)
Last Fill: 10/18/2022  Next Visit: Message sent to front desk to schedule appt. Return in about 6 months (around 02/06/2023) for Osteoarthritis.   Last Visit: 08/08/2022  Dx: Primary insomnia   Current Dose per office note on 08/08/2022: Ambien 5 mg 1 tablet by mouth at bedtime as needed for insomnia.   Okay to refill Ambien?

## 2022-11-16 NOTE — Telephone Encounter (Signed)
Please call patient to schedule appt. Thank you.  Return in about 6 months (around 02/06/2023) for Osteoarthritis.

## 2022-11-19 MED ORDER — ZOLPIDEM TARTRATE 5 MG PO TABS: 5.0000 mg | ORAL_TABLET | Freq: Every evening | ORAL | 0 refills | Status: DC | PRN

## 2022-12-04 ENCOUNTER — Ambulatory Visit (INDEPENDENT_AMBULATORY_CARE_PROVIDER_SITE_OTHER): Payer: 59 | Admitting: Sports Medicine

## 2022-12-04 VITALS — BP 110/78 | Ht 64.5 in | Wt 130.0 lb

## 2022-12-04 DIAGNOSIS — R2689 Other abnormalities of gait and mobility: Secondary | ICD-10-CM

## 2022-12-04 DIAGNOSIS — M25562 Pain in left knee: Secondary | ICD-10-CM

## 2022-12-04 DIAGNOSIS — G8929 Other chronic pain: Secondary | ICD-10-CM

## 2022-12-04 NOTE — Assessment & Plan Note (Signed)
I modified her orthotics today.  These do give her some relief if we can give her support without too much pressure on her arches.  We remove the scaphoid pads and they seem to feel better

## 2022-12-04 NOTE — Progress Notes (Signed)
Chief complaint generalized muscle aching and left knee feeling unstable  Patient is status post a total knee on the left and I sent her to Dr. Anselm Pancoast for his opinion.  He feels that the components are stable and that she should undergo more extensive physical therapy.  She does feel that after a few physical therapy sessions she seen some improvement in her stability.  Another issue is pressure on her arch where we added scaphoid pads to her orthotics.  I remove these today and she felt more comfortable.  Third problem is chronic musculoskeletal pain since she stopped her meloxicam.  I reassured her that I thought she could use the meloxicam and Dr. Ladona Mow suggested that she take it 3 weeks and off 1 week.  I tried her on colchicine which gave her significant relief of some muscle aches but she had a sore throat and wondered if that could have been a side effect of the medication.  We may try this again to see if it is helpful.  Physical exam Pleasant female in no acute distress BP 110/78   Ht 5' 4.5" (1.638 m)   Wt 130 lb (59 kg)   BMI 21.97 kg/m  Left knee still feels looser on a drawer test to me than the right but does have an endpoint There is no swelling or pain and testing the knee today  Feet show loss of longitudinal arch and some pronation However the orthotics to correct this pretty well even without the scaphoid pads so we will continue with that

## 2022-12-04 NOTE — Assessment & Plan Note (Signed)
She will continue to do physical therapy I wanted her to add hamstring exercises to see if that would improve the stability  Note on simple hamstring strength testing she gets cramps in both hamstrings and this could relate to her previous lumbar fusions  We will follow this up in about 6 weeks

## 2022-12-09 ENCOUNTER — Other Ambulatory Visit: Payer: Self-pay | Admitting: Physician Assistant

## 2022-12-10 NOTE — Telephone Encounter (Signed)
Last Fill: 11/19/2022  Next Visit: 02/07/2023  Last Visit: 08/08/2022  Dx: Primary insomnia   Current Dose per office note on 08/08/2022: Ambien 5 mg 1 tablet by mouth at bedtime as needed for insomnia.   Okay to refill Ambien?

## 2023-01-07 ENCOUNTER — Other Ambulatory Visit: Payer: Self-pay | Admitting: Physician Assistant

## 2023-01-07 NOTE — Telephone Encounter (Signed)
Last Fill: 12/10/2022  Next Visit: 01/29/2023  Last Visit: 08/08/2022  Dx: Primary insomnia   Current Dose per office note on 08/08/2022: Ambien 5 mg 1 tablet by mouth at bedtime as needed for insomnia.   Okay to refill Ambien?

## 2023-01-09 ENCOUNTER — Telehealth: Payer: Self-pay

## 2023-01-09 NOTE — Telephone Encounter (Signed)
Patient contacted the office and states her Ambien was denied according to her pharmacy. Advised patient we sent in the Ambien on 01/07/2023 and received a receipt from the pharmacy. Contacted the pharmacy and spoke to a pharmacist who advised the prescription was only denied to her because she was trying to pick it up too early. The Pharmacist states that the soonest the patient can pick up the medication is 01/14/2023. Contacted the patient and advised she tried to pick it up to soon and, per the Pharmacist, the soonest she can pick up the prescription is 01/14/2023. Patient verbalized understanding.

## 2023-01-15 ENCOUNTER — Other Ambulatory Visit: Payer: Self-pay | Admitting: Rheumatology

## 2023-01-15 NOTE — Telephone Encounter (Signed)
Last Fill: 09/18/2022  Next Visit: 01/29/2023  Last Visit: 08/08/2022  Dx: DDD (degenerative disc disease), thoracic   Current Dose per office note on 08/08/2022: Robaxin 750 mg p.o. nightly as needed.   Okay to refill Methocarbamol?

## 2023-01-15 NOTE — Progress Notes (Deleted)
Office Visit Note  Patient: Nicole Oneill             Date of Birth: 1961-04-16           MRN: [id number]             PCP: [name], MD Referring: [name], MD Visit Date: 01/29/2023 Occupation: @GUAROCC @  Subjective:  No chief complaint on file.   History of Present Illness: Nicole Oneill is a 62 y.o. female ***     Activities of Daily Living:  Patient reports morning stiffness for *** {minute/hour:19697}.   Patient {ACTIONS;DENIES/REPORTS:21021675::"Denies"} nocturnal pain.  Difficulty dressing/grooming: {ACTIONS;DENIES/REPORTS:21021675::"Denies"} Difficulty climbing stairs: {ACTIONS;DENIES/REPORTS:21021675::"Denies"} Difficulty getting out of chair: {ACTIONS;DENIES/REPORTS:21021675::"Denies"} Difficulty using hands for taps, buttons, cutlery, and/or writing: {ACTIONS;DENIES/REPORTS:21021675::"Denies"}  No Rheumatology ROS completed.   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 02/06/2022   Nonobstructive CAD 05/04/2021   Hypertension 05/04/2021   Hyperlipidemia 05/04/2021   Shortness of breath 04/04/2021   OSA on CPAP 04/04/2021   Right shoulder pain 02/19/2019   DDD (degenerative disc disease), cervical 04/28/2018   DDD (degenerative disc disease), lumbar 10/29/2017   Sicca syndrome, unspecified 10/29/2017   Left anterior shoulder pain 11/27/2016   History of lumbar fusion 10/16/2016   History of fusion of cervical spine 10/16/2016   History of total knee arthroplasty, left 2012 10/16/2016   Primary insomnia 10/16/2016   DDD lumbar 10/15/2016   DJD (degenerative joint disease), cervical 10/15/2016   DDD (degenerative disc disease), thoracic 10/15/2016   Primary osteoarthritis of both knees 10/15/2016   Tendinopathy of right gluteus medius 05/29/2016   Chronic lumbar radiculopathy  03/08/2016   Heart murmur, systolic 06/19/2014   Disorder of SI (sacroiliac) joint 06/02/2014   Functional gait abnormality 06/02/2014   Ehlers-Danlos syndrome type III 03/10/2014   Foot arch pain 03/10/2014   Left knee pain 05/26/2013   Primary osteoarthritis of both hands 05/26/2013   Effusion of knee joint, left 08/05/2012   Osteoarthritis of multiple joints 10/12/2010    Past Medical History:  Diagnosis Date   Anemia    Arthritis    osteo   Atrial tachycardia    x 10 years ago wias diagnosed with short runs of atrial tachycardia while wearing a heart monitor.  No problems since.   CAD (coronary artery disease)    noted on coronary CTA in 05/2019   Chronic dyspnea    Complication of anesthesia    urinary retention requiring indwelling  foley catheterization   DDD (degenerative disc disease)    Ehlers-Danlos syndrome type III    Fibromyalgia    History of blood transfusion    Hyperlipidemia    Hypertension    Osteoarthritis    Sleep apnea, obstructive    Thoracic outlet syndrome 07/2020   per patient     Family History  Problem Relation Age of Onset   Stroke Mother        hemorrhagic   Hypertension Father    Cancer - Prostate Father    Atrial fibrillation Father    Congestive Heart Failure Father    Anorexia nervosa Sister    Heart attack Maternal Grandmother    Stroke Maternal Grandfather    Cancer - Colon Paternal Grandmother    Cancer - Colon Paternal Grandfather    Healthy Daughter  Healthy Daughter    Healthy Daughter    Past Surgical History:  Procedure Laterality Date   BACK SURGERY     Cervical fusion C5-7, fusion  T10-S1   DIAGNOSTIC LAPAROSCOPY     x 3   EYE SURGERY     bilateral tear duct fusion   JOINT REPLACEMENT     partial left knee 2005, total left knee 2012   KNEE ARTHROPLASTY     KNEE ARTHROSCOPY  08/06/2012   Procedure: ARTHROSCOPY KNEE;  Surgeon: Loanne Drilling, MD;  Location: [facility];  Service: Orthopedics;  Laterality: Left;   WITH SYNOVECTOMY and debridement   OSTEOTOMY     bilateral tibial   Social History   Social History Narrative   Patient lives at home with family.   Caffeine Use: 4-5 sodas weekly   Immunization History  Administered Date(s) Administered   Influenza Inj Mdck Quad Pf 04/22/2017   Influenza,inj,quad, With Preservative 04/08/2017   Moderna Sars-Covid-2 Vaccination 10/11/2019, 11/06/2019, 10/08/2020   PFIZER(Purple Top)SARS-COV-2 Vaccination 06/09/2020   Pfizer Covid-19 Vaccine Bivalent Booster 57yrs & up 03/22/2021     Objective: Vital Signs: There were no vitals taken for this visit.   Physical Exam   Musculoskeletal Exam: ***  CDAI Exam: CDAI Score: -- Patient Global: --; Provider Global: -- Swollen: --; Tender: -- Joint Exam 01/29/2023   No joint exam has been documented for this visit   There is currently no information documented on the homunculus. Go to the Rheumatology activity and complete the homunculus joint exam.  Investigation: No additional findings.  Imaging: No results found.  Recent Labs: Lab Results  Component Value Date   WBC 4.6 01/31/2022   HGB 12.5 01/31/2022   PLT 178 01/31/2022   NA 145 01/31/2022   K 4.6 01/31/2022   CL 107 01/31/2022   CO2 32 01/31/2022   GLUCOSE 71 01/31/2022   BUN 17 01/31/2022   CREATININE 0.80 01/31/2022   BILITOT 0.5 01/31/2022   ALKPHOS 65 03/07/2020   AST 18 01/31/2022   ALT 19 01/31/2022   PROT 6.6 01/31/2022   ALBUMIN 4.5 03/07/2020   CALCIUM 9.1 01/31/2022   GFRAA 91 08/04/2020   QFTBGOLDPLUS NEGATIVE 03/21/2021    Speciality Comments: No specialty comments available.  Procedures:  No procedures performed Allergies: Patient has no known allergies.   Assessment / Plan:     Visit Diagnoses: Primary osteoarthritis of both hands  History of arthroplasty of left knee  Trochanteric bursitis of both hips  DDD (degenerative disc disease), cervical  DDD (degenerative disc disease), lumbar  DDD  (degenerative disc disease), thoracic  Sicca syndrome (HCC)  Osteopenia of multiple sites  Vitamin D deficiency  Medication management  Ehlers-Danlos syndrome type III  Other fatigue  Primary insomnia  History of sleep apnea  Orders: No orders of the defined types were placed in this encounter.  No orders of the defined types were placed in this encounter.   Face-to-face time spent with patient was *** minutes. Greater than 50% of time was spent in counseling and coordination of care.  Follow-Up Instructions: No follow-ups on file.   Gearldine Bienenstock, PA-C  Note - This record has been created using Dragon software.  Chart creation errors have been sought, but may not always  have been located. Such creation errors do not reflect on  the standard of medical care.

## 2023-01-29 ENCOUNTER — Ambulatory Visit: Payer: 59 | Admitting: Physician Assistant

## 2023-01-29 DIAGNOSIS — M19041 Primary osteoarthritis, right hand: Secondary | ICD-10-CM

## 2023-01-29 DIAGNOSIS — M3500 Sicca syndrome, unspecified: Secondary | ICD-10-CM

## 2023-01-29 DIAGNOSIS — Q7962 Hypermobile Ehlers-Danlos syndrome: Secondary | ICD-10-CM

## 2023-01-29 DIAGNOSIS — M5030 Other cervical disc degeneration, unspecified cervical region: Secondary | ICD-10-CM

## 2023-01-29 DIAGNOSIS — Z96652 Presence of left artificial knee joint: Secondary | ICD-10-CM

## 2023-01-29 DIAGNOSIS — Z8669 Personal history of other diseases of the nervous system and sense organs: Secondary | ICD-10-CM

## 2023-01-29 DIAGNOSIS — F5101 Primary insomnia: Secondary | ICD-10-CM

## 2023-01-29 DIAGNOSIS — Z79899 Other long term (current) drug therapy: Secondary | ICD-10-CM

## 2023-01-29 DIAGNOSIS — E559 Vitamin D deficiency, unspecified: Secondary | ICD-10-CM

## 2023-01-29 DIAGNOSIS — M8589 Other specified disorders of bone density and structure, multiple sites: Secondary | ICD-10-CM

## 2023-01-29 DIAGNOSIS — R5383 Other fatigue: Secondary | ICD-10-CM

## 2023-01-29 DIAGNOSIS — M7061 Trochanteric bursitis, right hip: Secondary | ICD-10-CM

## 2023-01-29 DIAGNOSIS — M5134 Other intervertebral disc degeneration, thoracic region: Secondary | ICD-10-CM

## 2023-01-29 DIAGNOSIS — M5136 Other intervertebral disc degeneration, lumbar region: Secondary | ICD-10-CM

## 2023-01-29 NOTE — Progress Notes (Unsigned)
Office Visit Note  Patient: Nicole Oneill             Date of Birth: 01/23/1961           MRN: [id number]             PCP: [name], MD Referring: [name], MD Visit Date: 02/12/2023 Occupation: @GUAROCC @  Subjective:  Refill Remus Loffler   History of Present Illness: Nicole Oneill is a 62 y.o. female with history of ostearthritis and DDD.  Patient continues to have intermittent arthralgias and joint stiffness.  She has been taking meloxicam sparingly for pain relief.  If she takes meloxicam for several days consecutively she notices increased gastritis.  She has found meloxicam to be effective at helping with her arthralgias but is hesitant to take it on consecutive days long-term.  Patient continues to find methocarbamol to be helpful at alleviating muscle spasms.  She takes Ambien 5 mg 1 tablet at bedtime for insomnia.  Patient requested a refill of Ambien to be sent to the pharmacy today to take with her on her upcoming trip to [place].   Activities of Daily Living:  Patient reports morning stiffness for 30 minutes.   Patient Reports nocturnal pain.  Difficulty dressing/grooming: Denies Difficulty climbing stairs: Denies Difficulty getting out of chair: Denies Difficulty using hands for taps, buttons, cutlery, and/or writing: Reports  Review of Systems  Constitutional:  Negative for fatigue.  HENT:  Positive for mouth dryness. Negative for mouth sores.   Eyes:  Positive for dryness.  Respiratory:  Positive for shortness of breath.   Cardiovascular:  Negative for chest pain and palpitations.  Gastrointestinal:  Positive for constipation. Negative for blood in stool and diarrhea.  Endocrine: Negative for increased urination.  Genitourinary:  Negative for involuntary urination.  Musculoskeletal:  Positive for joint pain, joint pain, joint swelling, myalgias, muscle weakness, morning stiffness, muscle tenderness and myalgias. Negative for gait problem.  Skin:  Negative for color  change, rash, hair loss and sensitivity to sunlight.  Allergic/Immunologic: Negative for susceptible to infections.  Neurological:  Negative for dizziness and headaches.  Hematological:  Negative for swollen glands.  Psychiatric/Behavioral:  Positive for sleep disturbance. Negative for depressed mood. The patient is not nervous/anxious.     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 02/06/2022   Nonobstructive CAD 05/04/2021   Hypertension 05/04/2021   Hyperlipidemia 05/04/2021   Shortness of breath 04/04/2021   OSA on CPAP 04/04/2021   Right shoulder pain 02/19/2019   DDD (degenerative disc disease), cervical 04/28/2018   DDD (degenerative disc disease), lumbar 10/29/2017   Sicca syndrome, unspecified 10/29/2017   Left anterior shoulder pain 11/27/2016   History of lumbar fusion 10/16/2016   History of fusion of cervical spine 10/16/2016   History of total knee arthroplasty, left 2012 10/16/2016   Primary insomnia 10/16/2016   DDD lumbar 10/15/2016   DJD (degenerative joint disease), cervical 10/15/2016   DDD (degenerative disc disease), thoracic 10/15/2016   Primary osteoarthritis of both knees 10/15/2016   Tendinopathy of right gluteus medius 05/29/2016   Chronic lumbar radiculopathy 03/08/2016   Heart murmur, systolic 06/19/2014   Disorder of SI (sacroiliac) joint 06/02/2014   Functional gait abnormality 06/02/2014   Ehlers-Danlos syndrome type III 03/10/2014   Foot arch pain 03/10/2014   Left knee  pain 05/26/2013   Primary osteoarthritis of both hands 05/26/2013   Effusion of knee joint, left 08/05/2012   Osteoarthritis of multiple joints 10/12/2010    Past Medical History:  Diagnosis Date   Anemia    Arthritis    osteo   Atrial tachycardia    x 10 years ago wias diagnosed with short runs of  atrial tachycardia while wearing a heart monitor.  No problems since.   CAD (coronary artery disease)    noted on coronary CTA in 05/2019   Chronic dyspnea    Complication of anesthesia    urinary retention requiring indwelling  foley catheterization   DDD (degenerative disc disease)    Ehlers-Danlos syndrome type III    Fibromyalgia    History of blood transfusion    Hyperlipidemia    Hypertension    Osteoarthritis    Sleep apnea, obstructive    Thoracic outlet syndrome 07/2020   per patient     Family History  Problem Relation Age of Onset   Stroke Mother        hemorrhagic   Hypertension Father    Cancer - Prostate Father    Atrial fibrillation Father    Congestive Heart Failure Father    Anorexia nervosa Sister    Heart attack Maternal Grandmother    Stroke Maternal Grandfather    Cancer - Colon Paternal Grandmother    Cancer - Colon Paternal Grandfather    Healthy Daughter    Healthy Daughter    Healthy Daughter    Past Surgical History:  Procedure Laterality Date   BACK SURGERY     Cervical fusion C5-7, fusion  T10-S1   DIAGNOSTIC LAPAROSCOPY     x 3   EYE SURGERY     bilateral tear duct fusion   JOINT REPLACEMENT     partial left knee 2005, total left knee 2012   KNEE ARTHROPLASTY     KNEE ARTHROSCOPY  08/06/2012   Procedure: ARTHROSCOPY KNEE;  Surgeon: Loanne Drilling, MD;  Location: [facility];  Service: Orthopedics;  Laterality: Left;  WITH SYNOVECTOMY and debridement   OSTEOTOMY     bilateral tibial   Social History   Social History Narrative   Patient lives at home with family.   Caffeine Use: 4-5 sodas weekly   Immunization History  Administered Date(s) Administered   Influenza Inj Mdck Quad Pf 04/22/2017   Influenza,inj,quad, With Preservative 04/08/2017   Moderna Sars-Covid-2 Vaccination 10/11/2019, 11/06/2019, 10/08/2020   PFIZER(Purple Top)SARS-COV-2 Vaccination 06/09/2020   Pfizer Covid-19 Vaccine Bivalent Booster 29yrs & up 03/22/2021      Objective: Vital Signs: BP 115/73 (BP Location: Left Arm, Patient Position: Sitting, Cuff Size: Normal)   Pulse 88   Resp 14   Ht 5\' 5"  (1.651 m)   Wt 131 lb (59.4 kg)   BMI 21.80 kg/m    Physical Exam Vitals and nursing note reviewed.  Constitutional:      Appearance: She is well-developed.  HENT:     Head: Normocephalic and atraumatic.  Eyes:     Conjunctiva/sclera: Conjunctivae normal.  Cardiovascular:     Rate and Rhythm: Normal rate and regular rhythm.     Heart sounds: Normal heart sounds.  Pulmonary:     Effort: Pulmonary effort is normal.     Breath sounds: Normal breath sounds.  Abdominal:     General: Bowel sounds are normal.     Palpations: Abdomen is soft.  Musculoskeletal:     Cervical back: Normal range of  motion.  Lymphadenopathy:     Cervical: No cervical adenopathy.  Skin:    General: Skin is warm and dry.     Capillary Refill: Capillary refill takes less than 2 seconds.  Neurological:     Mental Status: She is alert and oriented to person, place, and time.  Psychiatric:        Behavior: Behavior normal.      Musculoskeletal Exam: C-spine has limited range of motion.  Limited mobility of the thoracic and lumbar spine.  Shoulder joints have good range of motion.  Tenderness of both elbows.  Wrist joints have good range of motion with no tenderness or synovitis.  Subluxation and thickening of bilateral CMC joints.  PIP and DIP thickening consistent with osteoarthritis of both hands.  Hip joints have good range of motion with no groin pain.  Left knee replacement has good range of motion with no warmth or effusion.  Right knee joint has good range of motion with no warmth or effusion.  Ankle joints have good range of motion with no tenderness or joint swelling.  CDAI Exam: CDAI Score: -- Patient Global: --; Provider Global: -- Swollen: --; Tender: -- Joint Exam 02/12/2023   No joint exam has been documented for this visit   There is currently no  information documented on the homunculus. Go to the Rheumatology activity and complete the homunculus joint exam.  Investigation: No additional findings.  Imaging: No results found.  Recent Labs: Lab Results  Component Value Date   WBC 4.6 01/31/2022   HGB 12.5 01/31/2022   PLT 178 01/31/2022   NA 145 01/31/2022   K 4.6 01/31/2022   CL 107 01/31/2022   CO2 32 01/31/2022   GLUCOSE 71 01/31/2022   BUN 17 01/31/2022   CREATININE 0.80 01/31/2022   BILITOT 0.5 01/31/2022   ALKPHOS 65 03/07/2020   AST 18 01/31/2022   ALT 19 01/31/2022   PROT 6.6 01/31/2022   ALBUMIN 4.5 03/07/2020   CALCIUM 9.1 01/31/2022   GFRAA 91 08/04/2020   QFTBGOLDPLUS NEGATIVE 03/21/2021    Speciality Comments: No specialty comments available.  Procedures:  No procedures performed Allergies: Patient has no known allergies.   Assessment / Plan:     Visit Diagnoses: Primary osteoarthritis of both hands - She has CMC joint thickening and subluxation bilaterally.  PIP and DIP thickening consistent with osteoarthritis of both hands.  No tenderness or synovitis over MCP joints.  She takes meloxicam 15mg  1 tablet by mouth daily as needed for pain relief.  If she takes meloxicam on consecutive days for a long period of time she experiences symptoms of gastritis.  Discussed the importance of taking meloxicam with food and to take it sparingly. Discussed the importance of joint protection and muscle strengthening.  She was vies notify us if she develops any new or worsening symptoms.  She will follow-up in the office in 6 months or sooner if needed.  History of arthroplasty of left knee: Performed by Dr. Lequita Halt.  Doing well.  Good range of motion with no warmth or effusion.  Trochanteric bursitis of both hips: Good range of motion of both hip joints with no groin pain.  Not currently symptomatic.  DDD (degenerative disc disease), cervical: C-spine has limited range of motion.  No symptoms of radiculopathy at  this time.  DDD (degenerative disc disease), thoracic - Status post fusion.  She is followed by Dr. Berline Chough and Dr. Darrick Penna.  She takes Robaxin 750 mg 1 tablet at bedtime  as needed for muscle spasms.    DDD (degenerative disc disease), lumbar - S/p fusion: Limited mobility.  Sicca syndrome (HCC): Chronic.  She has been experiencing increased dryness in the right eye.  Upcoming appointment at [facility] ophthalmology in fall 2024.  Osteopenia of multiple sites: DEXA ordered by PCP.  According to the patient her bone density has been stable. No recent falls or fractures. She is taking calcium 600 mg daily and a vitamin D supplement daily.  Vitamin D deficiency: She is taking a calcium and vitamin D supplement daily.  Primary insomnia: She is taking Ambien 5 mg 1 tablet at bedtime for insomnia.  Patient requested a refill to be sent to the pharmacy today for which she can pick up prior to leaving for [place].  Other medical conditions are listed as follows:  Ehlers-Danlos syndrome type III  Other fatigue  History of sleep apnea  Orders: No orders of the defined types were placed in this encounter.  Meds ordered this encounter  Medications   zolpidem (AMBIEN) 5 MG tablet    Sig: Take 1 tablet (5 mg total) by mouth at bedtime as needed for sleep.    Dispense:  30 tablet    Refill:  0    Please dispense today 02/12/23    Follow-Up Instructions: Return in about 6 months (around 08/15/2023) for Osteoarthritis, DDD.   Gearldine Bienenstock, PA-C  Note - This record has been created using Dragon software.  Chart creation errors have been sought, but may not always  have been located. Such creation errors do not reflect on  the standard of medical care.

## 2023-02-07 ENCOUNTER — Ambulatory Visit: Payer: 59 | Admitting: Physician Assistant

## 2023-02-12 ENCOUNTER — Encounter: Payer: Self-pay | Admitting: Physician Assistant

## 2023-02-12 ENCOUNTER — Ambulatory Visit: Payer: 59 | Attending: Physician Assistant | Admitting: Physician Assistant

## 2023-02-12 ENCOUNTER — Other Ambulatory Visit: Payer: Self-pay | Admitting: Cardiovascular Disease

## 2023-02-12 VITALS — BP 115/73 | HR 88 | Resp 14 | Ht 65.0 in | Wt 131.0 lb

## 2023-02-12 DIAGNOSIS — M7061 Trochanteric bursitis, right hip: Secondary | ICD-10-CM | POA: Diagnosis not present

## 2023-02-12 DIAGNOSIS — M7062 Trochanteric bursitis, left hip: Secondary | ICD-10-CM

## 2023-02-12 DIAGNOSIS — F5101 Primary insomnia: Secondary | ICD-10-CM

## 2023-02-12 DIAGNOSIS — M5030 Other cervical disc degeneration, unspecified cervical region: Secondary | ICD-10-CM

## 2023-02-12 DIAGNOSIS — Z8669 Personal history of other diseases of the nervous system and sense organs: Secondary | ICD-10-CM

## 2023-02-12 DIAGNOSIS — M19041 Primary osteoarthritis, right hand: Secondary | ICD-10-CM | POA: Diagnosis not present

## 2023-02-12 DIAGNOSIS — M8589 Other specified disorders of bone density and structure, multiple sites: Secondary | ICD-10-CM

## 2023-02-12 DIAGNOSIS — Z96652 Presence of left artificial knee joint: Secondary | ICD-10-CM

## 2023-02-12 DIAGNOSIS — M3500 Sicca syndrome, unspecified: Secondary | ICD-10-CM

## 2023-02-12 DIAGNOSIS — M5134 Other intervertebral disc degeneration, thoracic region: Secondary | ICD-10-CM

## 2023-02-12 DIAGNOSIS — M19042 Primary osteoarthritis, left hand: Secondary | ICD-10-CM

## 2023-02-12 DIAGNOSIS — Q7962 Hypermobile Ehlers-Danlos syndrome: Secondary | ICD-10-CM

## 2023-02-12 DIAGNOSIS — R5383 Other fatigue: Secondary | ICD-10-CM

## 2023-02-12 DIAGNOSIS — E559 Vitamin D deficiency, unspecified: Secondary | ICD-10-CM

## 2023-02-12 DIAGNOSIS — M5136 Other intervertebral disc degeneration, lumbar region: Secondary | ICD-10-CM

## 2023-02-12 MED ORDER — ZOLPIDEM TARTRATE 5 MG PO TABS: 5.0000 mg | ORAL_TABLET | Freq: Every evening | ORAL | 0 refills | Status: DC | PRN

## 2023-02-26 ENCOUNTER — Other Ambulatory Visit: Payer: Self-pay | Admitting: Obstetrics and Gynecology

## 2023-02-26 DIAGNOSIS — Z1231 Encounter for screening mammogram for malignant neoplasm of breast: Secondary | ICD-10-CM

## 2023-03-07 ENCOUNTER — Encounter: Payer: Self-pay | Admitting: Ophthalmology

## 2023-03-07 DIAGNOSIS — G453 Amaurosis fugax: Secondary | ICD-10-CM

## 2023-03-08 ENCOUNTER — Other Ambulatory Visit: Payer: Self-pay | Admitting: Registered Nurse

## 2023-03-08 ENCOUNTER — Ambulatory Visit: Admission: RE | Admit: 2023-03-08 | Payer: 59 | Source: Ambulatory Visit

## 2023-03-08 ENCOUNTER — Ambulatory Visit
Admission: RE | Admit: 2023-03-08 | Discharge: 2023-03-08 | Disposition: A | Discharge status: Home / Self Care | Payer: 59 | Source: Ambulatory Visit | Attending: Registered Nurse | Admitting: Registered Nurse

## 2023-03-08 DIAGNOSIS — H53129 Transient visual loss, unspecified eye: Secondary | ICD-10-CM

## 2023-03-08 MED ORDER — IOPAMIDOL (ISOVUE-370) INJECTION 76%
75.0000 mL | Freq: Once | INTRAVENOUS | Status: AC | PRN
  Administered 2023-03-08: 75 mL via INTRAVENOUS

## 2023-03-09 ENCOUNTER — Ambulatory Visit (HOSPITAL_BASED_OUTPATIENT_CLINIC_OR_DEPARTMENT_OTHER): Payer: 59

## 2023-03-14 ENCOUNTER — Other Ambulatory Visit: Payer: Self-pay | Admitting: *Deleted

## 2023-03-14 MED ORDER — ZOLPIDEM TARTRATE 5 MG PO TABS: 5.0000 mg | ORAL_TABLET | Freq: Every evening | ORAL | 0 refills | Status: DC | PRN

## 2023-03-14 NOTE — Telephone Encounter (Signed)
Last Fill: 02/12/2023  Next Visit: 08/15/2023  Last Visit: 02/12/2023  Dx: Primary insomnia   Current Dose per office note on 02/12/2023: Ambien 5 mg 1 tablet at bedtime for insomnia.   Okay to refill Ambien?

## 2023-03-14 NOTE — Addendum Note (Signed)
Addended by: Henriette Combs on: 03/14/2023 01:06 PM   Modules accepted: Orders

## 2023-03-14 NOTE — Telephone Encounter (Signed)
Patient contacted the office to request a medication refill.   1. Name of Medication: Ambien  2. How are you currently taking this medication (dosage and times per day)? 5 mg po at bedtime   3. What pharmacy would you like for that to be sent to? [facility]- [address]

## 2023-03-19 HISTORY — PX: EYE SURGERY: SHX253

## 2023-04-14 ENCOUNTER — Other Ambulatory Visit: Payer: Self-pay | Admitting: Internal Medicine

## 2023-04-15 NOTE — Telephone Encounter (Signed)
Last Fill: 03/14/2023   Next Visit: 08/15/2023   Last Visit: 02/12/2023   Dx: Primary insomnia    Current Dose per office note on 02/12/2023: Ambien 5 mg 1 tablet at bedtime for insomnia.    Okay to refill Ambien?

## 2023-04-17 ENCOUNTER — Other Ambulatory Visit: Payer: Self-pay | Admitting: *Deleted

## 2023-04-17 ENCOUNTER — Ambulatory Visit: Payer: 59 | Admitting: Sports Medicine

## 2023-04-17 VITALS — Ht 65.0 in | Wt 130.0 lb

## 2023-04-17 DIAGNOSIS — M25552 Pain in left hip: Secondary | ICD-10-CM | POA: Diagnosis not present

## 2023-04-17 MED ORDER — METHOCARBAMOL 750 MG PO TABS: ORAL_TABLET | ORAL | 2 refills | Status: DC

## 2023-04-17 NOTE — Progress Notes (Signed)
PCP: [name], MD  Subjective:   HPI: Patient is a 62 y.o. female here for for left hip pain.  Patient states that she has been unable to do Pilates for the past 3 weeks because she had eye surgery recently.  Patient states that whenever she does not do any of her exercises she feels like she has the left-sided hip pain.  Patient states the pain is located on the lateral leg at the greater trochanter and travels down.  Patient denies any inguinal pain.  Patient states that the pain does limit stuff such as walking.  Patient was taking meloxicam daily for 20 years but stopped for the past 6 months, patient resumed taking it 1 week ago. The meloxicam does lessen her overall joint and muscle pain  Past Medical History:  Diagnosis Date   Anemia    Arthritis    osteo   Atrial tachycardia    x 10 years ago wias diagnosed with short runs of atrial tachycardia while wearing a heart monitor.  No problems since.   CAD (coronary artery disease)    noted on coronary CTA in 05/2019   Chronic dyspnea    Complication of anesthesia    urinary retention requiring indwelling  foley catheterization   DDD (degenerative disc disease)    Ehlers-Danlos syndrome type III    Fibromyalgia    History of blood transfusion    Hyperlipidemia    Hypertension    Osteoarthritis    Sleep apnea, obstructive    Thoracic outlet syndrome 07/2020   per patient     Current Outpatient Medications on File Prior to Visit  Medication Sig Dispense Refill   amLODipine (NORVASC) 2.5 MG tablet TAKE 1 TABLET BY MOUTH ONCE DAILY 90 tablet 1   amoxicillin-clavulanate (AUGMENTIN) 875-125 MG tablet TAKE 1 TABLET EVERY 12 HOURS BY MOUTH FOR 10 DAYS.     amphetamine-dextroamphetamine (ADDERALL) 5 MG tablet TAKE 1 TABLET BY MOUTH EVERY DAY AS NEEDED FOR ATTENTION     atorvastatin (LIPITOR) 20 MG tablet Take 20 mg by mouth daily.     Calcium Carbonate-Vitamin D (CALTRATE 600+D PO) Take 1 tablet by mouth daily.       Cholecalciferol (VITAMIN D3) 2000 units capsule Take 2,000 Units by mouth daily.     colchicine 0.6 MG tablet Take 1 tablet (0.6 mg total) by mouth 2 (two) times daily. 60 tablet 1   dextroamphetamine (DEXTROSTAT) 5 MG tablet Take 5 mg by mouth as needed. (Patient not taking: Reported on 02/12/2023)     doxycycline (VIBRA-TABS) 100 MG tablet Take 100 mg by mouth daily.     estradiol (ESTRACE) 0.1 MG/GM vaginal cream Place 1 Applicatorful vaginally at bedtime.     Famotidine (PEPCID PO) Take by mouth.     gabapentin (NEURONTIN) 100 MG capsule Take 100-200 mg by mouth 2 (two) times daily as needed.     gabapentin (NEURONTIN) 300 MG capsule TAKE 1 CAPSULE BY MOUTH EVERY MORNING THEN 1 CAPSULE AT MIDDAY THEN 2 CAPSULES AT BEDTIME 360 capsule 1   L-THREONINE PO Take by mouth.     meloxicam (MOBIC) 15 MG tablet Take 1 tablet (15 mg total) by mouth daily. 30 tablet 5   methocarbamol (ROBAXIN) 750 MG tablet TAKE 1 TABLET(750 MG) BY MOUTH EVERY DAY AT BEDTIME AS NEEDED FOR MUSCLE SPASM 30 tablet 2   mupirocin ointment (BACTROBAN) 2 % 1 application Externally Twice a day for 5 days     Polyethyl Glycol-Propyl Glycol 0.4-0.3 %  SOLN Apply to eye.     polyethylene glycol powder (GLYCOLAX/MIRALAX) 17 GM/SCOOP powder Take 1 Container by mouth as needed.     Probiotic Product (PROBIOTIC PO) Take 1 capsule by mouth daily.     Pyridoxine HCl (VITAMIN B-6 PO) Take by mouth.     Simethicone (GAS-X PO) Gas Relief     zolpidem (AMBIEN) 5 MG tablet TAKE 1 TABLET(5 MG) BY MOUTH AT BEDTIME AS NEEDED FOR SLEEP 30 tablet 0   No current facility-administered medications on file prior to visit.    Past Surgical History:  Procedure Laterality Date   BACK SURGERY     Cervical fusion C5-7, fusion  T10-S1   DIAGNOSTIC LAPAROSCOPY     x 3   EYE SURGERY     bilateral tear duct fusion   JOINT REPLACEMENT     partial left knee 2005, total left knee 2012   KNEE ARTHROPLASTY     KNEE ARTHROSCOPY  08/06/2012   Procedure:  ARTHROSCOPY KNEE;  Surgeon: Loanne Drilling, MD;  Location: [facility];  Service: Orthopedics;  Laterality: Left;  WITH SYNOVECTOMY and debridement   OSTEOTOMY     bilateral tibial    No Known Allergies  Ht 5\' 5"  (1.651 m)   Wt 130 lb (59 kg)   BMI 21.63 kg/m      03/24/2020    3:16 PM 04/06/2020    1:36 PM 06/21/2020   11:15 AM  [facility]  Frequency of aerobic exercise (# of days/week) 4 4 4   Average time in minutes 30 30 40  Frequency of strengthening activities (# of days/week) 2 2 2         No data to display              Objective:  Physical Exam:  Gen: NAD, comfortable in exam room  Hip, Left: No obvious rash, erythema, ecchymosis, or edema. Passive Log Roll equivalent b/l without restriction. ROM full in all directions; Strength 4/5 in abduction, rest of strength WNL. Pelvic alignment unremarkable to inspection and palpation. Non-antalgic gait without trendelenburg / unsteadiness. Greater trochanter with tenderness to palpation. No tenderness over piriformis. No SI joint tenderness and normal minimal SI movement.  Provocative Testing:    - FABER/FADIR test: NEG   - Ober's test: NEG    - Thomas test: NEG   - Trendelenburg test: NEG   - Ely's test: NEG   - Hop test: NEG    Assessment & Plan:  1. Hip pain, acute, left -Patient symptoms are likely related to some abductor weakness which could be causing some trochanteric bursitis.  Given that patient has not been doing any Pilates/strengthening exercises, her symptoms have likely been aggravated.  At this time, will do at home exercises which patient has been given as well as restarting the meloxicam.  Patient was advised to start Pilates again.  If patient does not have any improvement with the at home exercises, walks, as well as the Pilates, will consider steroid injection versus shockwave therapy. Patient symptoms likely stemming from her hypermobility syndrome.   Brenton Grills MD, PGY-4   Sports Medicine Fellow [facility]  I observed and examined the patient with the sports medicine resident and agree with assessment and plan.  Note reviewed and modified by me. Sterling Big MD

## 2023-04-17 NOTE — Telephone Encounter (Signed)
Refill request received via fax from [facility]  for Methocarbamol   Last Fill: 01/15/2023  Next Visit: 08/15/2023  Last Visit: 02/12/2023  Dx: DDD (degenerative disc disease), thoracic   Current Dose per office note on 02/12/2023: Robaxin 750 mg 1 tablet at bedtime as needed for muscle spasms.    Okay to refill Methocarbamol?

## 2023-04-18 ENCOUNTER — Ambulatory Visit
Admission: RE | Admit: 2023-04-18 | Discharge: 2023-04-18 | Disposition: A | Discharge status: Home / Self Care | Payer: 59 | Source: Ambulatory Visit | Attending: Obstetrics and Gynecology | Admitting: Obstetrics and Gynecology

## 2023-04-18 DIAGNOSIS — Z1231 Encounter for screening mammogram for malignant neoplasm of breast: Secondary | ICD-10-CM

## 2023-05-01 ENCOUNTER — Encounter: Payer: Self-pay | Admitting: Sports Medicine

## 2023-05-01 ENCOUNTER — Ambulatory Visit: Payer: 59 | Admitting: Sports Medicine

## 2023-05-13 ENCOUNTER — Other Ambulatory Visit: Payer: Self-pay | Admitting: Internal Medicine

## 2023-05-13 MED ORDER — ZOLPIDEM TARTRATE 5 MG PO TABS: 5.0000 mg | ORAL_TABLET | Freq: Every evening | ORAL | 0 refills | Status: DC | PRN

## 2023-05-13 NOTE — Telephone Encounter (Signed)
Last Fill: 04/15/2023  Next Visit: 08/15/2023  Last Visit: 02/12/2023  Dx: Primary insomnia   Current Dose per office note on 02/12/2023: Ambien 5 mg 1 tablet at bedtime for insomnia.   Okay to refill Ambien?

## 2023-05-17 ENCOUNTER — Encounter: Payer: Self-pay | Admitting: Cardiovascular Disease

## 2023-05-17 ENCOUNTER — Encounter: Payer: Self-pay | Admitting: Sports Medicine

## 2023-05-17 DIAGNOSIS — E785 Hyperlipidemia, unspecified: Secondary | ICD-10-CM

## 2023-05-20 ENCOUNTER — Telehealth: Payer: Self-pay | Admitting: Cardiovascular Disease

## 2023-05-20 MED ORDER — ATORVASTATIN CALCIUM 10 MG PO TABS: 10.0000 mg | ORAL_TABLET | Freq: Every day | ORAL | 3 refills | Status: AC

## 2023-05-20 NOTE — Telephone Encounter (Signed)
*  STAT* If patient is at the pharmacy, call can be transferred to refill team.   1. Which medications need to be refilled? (please list name of each medication and dose if known) atorvastatin (LIPITOR) 10 MG tablet    2. Would you like to learn more about the convenience, safety, & potential cost savings by using the [facility]?      3. Are you open to using the [facility] (Type [facility]. ).   4. Which pharmacy/location (including street and city if local pharmacy) is medication to be sent to? [id number] - [place], [place] - [address] AT [facility] OF [address]    5. Do they need a 30 day or 90 day supply? 90

## 2023-05-20 NOTE — Telephone Encounter (Signed)
Patient is following up requesting a call back regarding extreme muscle pain. She would like to know if Atorvastatin can be reduced. Please advise.

## 2023-05-20 NOTE — Telephone Encounter (Signed)
Spoke to pt, she has lots of muscle issues so she can not tolerate 20 mg Lipitor. Advised her to take 10 mg daily and we will repeat lab end of Jan,2025. LDLc was slightly above goal  (73mg /dl) when she was on Lipitor 10 mg last year 05/2023. July 2024 Lipitor dose was increased to 20 mg and since then she is having muscle aches.

## 2023-05-21 NOTE — Telephone Encounter (Signed)
Per Dr. Tresa Endo, it is ok for pt to take 10mg  of the Atorvastatin due to the muscle aches.

## 2023-05-29 ENCOUNTER — Other Ambulatory Visit: Payer: Self-pay | Admitting: Physician Assistant

## 2023-05-29 ENCOUNTER — Other Ambulatory Visit: Payer: Self-pay | Admitting: Cardiovascular Disease

## 2023-05-30 ENCOUNTER — Other Ambulatory Visit: Payer: Self-pay | Admitting: Cardiovascular Disease

## 2023-05-30 ENCOUNTER — Ambulatory Visit (INDEPENDENT_AMBULATORY_CARE_PROVIDER_SITE_OTHER): Payer: 59 | Admitting: Family Medicine

## 2023-05-30 VITALS — BP 118/73 | Ht 65.0 in | Wt 125.0 lb

## 2023-05-30 DIAGNOSIS — M25552 Pain in left hip: Secondary | ICD-10-CM | POA: Diagnosis not present

## 2023-05-30 MED ORDER — PREDNISONE 10 MG PO TABS: ORAL_TABLET | ORAL | 0 refills | Status: DC

## 2023-05-30 NOTE — Progress Notes (Signed)
PCP: [name], MD  Subjective:   HPI: Patient is a 62 y.o. female here for follow-up of left hip pain.  Most recently seen 04/17/2023 at which time she was felt to have sequela of EDS and trochanteric bursitis.  She was given meloxicam and advised to start Pilates/home exercises again.  Today, her pain is worse.  She recently went to Pilates and slept in a different bed that she feels exacerbated her pain.  It is mostly at her lower back/buttocks on the left.  It feels tight.  Painful to palpation.  Before Pilates and sleeping a different bed, he was getting some better.  Past Medical History:  Diagnosis Date   Anemia    Arthritis    osteo   Atrial tachycardia (HCC)    x 10 years ago wias diagnosed with short runs of atrial tachycardia while wearing a heart monitor.  No problems since.   CAD (coronary artery disease)    noted on coronary CTA in 05/2019   Chronic dyspnea    Complication of anesthesia    urinary retention requiring indwelling  foley catheterization   DDD (degenerative disc disease)    Ehlers-Danlos syndrome type III    Fibromyalgia    History of blood transfusion    Hyperlipidemia    Hypertension    Osteoarthritis    Sleep apnea, obstructive    Thoracic outlet syndrome 07/2020   per patient     Current Outpatient Medications on File Prior to Visit  Medication Sig Dispense Refill   amLODipine (NORVASC) 2.5 MG tablet TAKE 1 TABLET BY MOUTH ONCE DAILY 90 tablet 1   amoxicillin-clavulanate (AUGMENTIN) 875-125 MG tablet TAKE 1 TABLET EVERY 12 HOURS BY MOUTH FOR 10 DAYS.     amphetamine-dextroamphetamine (ADDERALL) 5 MG tablet TAKE 1 TABLET BY MOUTH EVERY DAY AS NEEDED FOR ATTENTION     atorvastatin (LIPITOR) 10 MG tablet Take 1 tablet (10 mg total) by mouth daily. 90 tablet 3   Calcium Carbonate-Vitamin D (CALTRATE 600+D PO) Take 1 tablet by mouth daily.      Cholecalciferol (VITAMIN D3) 2000 units capsule Take 2,000 Units by mouth daily.     colchicine 0.6 MG  tablet Take 1 tablet (0.6 mg total) by mouth 2 (two) times daily. 60 tablet 1   dextroamphetamine (DEXTROSTAT) 5 MG tablet Take 5 mg by mouth as needed. (Patient not taking: Reported on 02/12/2023)     doxycycline (VIBRA-TABS) 100 MG tablet Take 100 mg by mouth daily.     estradiol (ESTRACE) 0.1 MG/GM vaginal cream Place 1 Applicatorful vaginally at bedtime.     Famotidine (PEPCID PO) Take by mouth.     gabapentin (NEURONTIN) 100 MG capsule Take 100-200 mg by mouth 2 (two) times daily as needed.     gabapentin (NEURONTIN) 300 MG capsule TAKE 1 CAPSULE BY MOUTH EVERY MORNING THEN 1 CAPSULE AT MIDDAY THEN 2 CAPSULES AT BEDTIME 360 capsule 1   L-THREONINE PO Take by mouth.     meloxicam (MOBIC) 15 MG tablet Take 1 tablet (15 mg total) by mouth daily. 30 tablet 5   methocarbamol (ROBAXIN) 750 MG tablet TAKE 1 TABLET(750 MG) BY MOUTH EVERY DAY AT BEDTIME AS NEEDED FOR MUSCLE SPASM 30 tablet 2   mupirocin ointment (BACTROBAN) 2 % 1 application Externally Twice a day for 5 days     Polyethyl Glycol-Propyl Glycol 0.4-0.3 % SOLN Apply to eye.     polyethylene glycol powder (GLYCOLAX/MIRALAX) 17 GM/SCOOP powder Take 1 Container by mouth  as needed.     Probiotic Product (PROBIOTIC PO) Take 1 capsule by mouth daily.     Pyridoxine HCl (VITAMIN B-6 PO) Take by mouth.     Simethicone (GAS-X PO) Gas Relief     zolpidem (AMBIEN) 5 MG tablet Take 1 tablet (5 mg total) by mouth at bedtime as needed for sleep. 30 tablet 0   No current facility-administered medications on file prior to visit.    Past Surgical History:  Procedure Laterality Date   BACK SURGERY     Cervical fusion C5-7, fusion  T10-S1   DIAGNOSTIC LAPAROSCOPY     x 3   EYE SURGERY     bilateral tear duct fusion   JOINT REPLACEMENT     partial left knee 2005, total left knee 2012   KNEE ARTHROPLASTY     KNEE ARTHROSCOPY  08/06/2012   Procedure: ARTHROSCOPY KNEE;  Surgeon: Loanne Drilling, MD;  Location: [facility];  Service: Orthopedics;   Laterality: Left;  WITH SYNOVECTOMY and debridement   OSTEOTOMY     bilateral tibial    No Known Allergies  There were no vitals taken for this visit.     03/24/2020    3:16 PM 04/06/2020    1:36 PM 06/21/2020   11:15 AM  [facility]  Frequency of aerobic exercise (# of days/week) 4 4 4   Average time in minutes 30 30 40  Frequency of strengthening activities (# of days/week) 2 2 2         No data to display              Objective:  Physical Exam:  Gen: NAD, comfortable in exam room  Low back: No gross deformity Pain to palpation along all paraspinal muscles as well as superior gluteal muscles on left side Pain with left hip abduction against resistance and 4/5 strength. Restricted range of motion of back Strength and sensation in bilateral lower extremities normal, gait normal Negative straight leg raise, FADIR, FABER testing   Assessment & Plan:  1.  Left gluteus medius and maximus strain: In setting of overuse.  Advised meloxicam, heating or icing depending on response, and rest.  Discussed taking prednisone Dosepak if meloxicam does not alleviate symptoms.  Follow-up in 2 weeks.  Janeal Holmes, MD PGY-2, [facility]

## 2023-05-30 NOTE — Patient Instructions (Signed)
You have a gluteal muscle strain (medius and maximus). Hold the exercises for now. Take meloxicam 15mg  daily with food. If this isn't helping enough go ahead and start the prednisone dose pack (don't take meloxicam, aleve, ibuprofen if you take this). Heat or ice, whichever feels better 15 minutes at a time as needed. Follow up in 2 weeks.

## 2023-06-03 ENCOUNTER — Other Ambulatory Visit: Payer: Self-pay | Admitting: Sports Medicine

## 2023-06-13 ENCOUNTER — Ambulatory Visit: Payer: 59 | Admitting: Family Medicine

## 2023-06-13 ENCOUNTER — Other Ambulatory Visit: Payer: Self-pay

## 2023-06-13 DIAGNOSIS — E785 Hyperlipidemia, unspecified: Secondary | ICD-10-CM

## 2023-06-15 ENCOUNTER — Other Ambulatory Visit: Payer: Self-pay | Admitting: Physician Assistant

## 2023-06-15 LAB — LIPID PANEL
Chol/HDL Ratio: 1.8 {ratio} (ref 0.0–4.4)
Cholesterol, Total: 171 mg/dL (ref 100–199)
HDL: 97 mg/dL (ref >39)
LDL Chol Calc (NIH): 65 mg/dL (ref 0–99)
Triglycerides: 44 mg/dL (ref 0–149)
VLDL Cholesterol Cal: 9 mg/dL (ref 5–40)

## 2023-06-17 NOTE — Telephone Encounter (Signed)
Last Fill: 05/13/2023   Next Visit: 08/15/2023   Last Visit: 02/12/2023   Dx: Primary insomnia    Current Dose per office note on 02/12/2023: Ambien 5 mg 1 tablet at bedtime for insomnia.    Okay to refill Ambien?

## 2023-06-18 ENCOUNTER — Other Ambulatory Visit: Payer: Self-pay | Admitting: *Deleted

## 2023-06-18 DIAGNOSIS — Z79899 Other long term (current) drug therapy: Secondary | ICD-10-CM

## 2023-06-19 ENCOUNTER — Other Ambulatory Visit: Payer: Self-pay | Admitting: Sports Medicine

## 2023-06-19 ENCOUNTER — Encounter: Payer: Self-pay | Admitting: Cardiovascular Disease

## 2023-06-19 ENCOUNTER — Other Ambulatory Visit: Payer: Self-pay | Admitting: Physician Assistant

## 2023-06-19 ENCOUNTER — Ambulatory Visit: Payer: 59 | Attending: Cardiovascular Disease | Admitting: Cardiovascular Disease

## 2023-06-19 DIAGNOSIS — I2510 Atherosclerotic heart disease of native coronary artery without angina pectoris: Secondary | ICD-10-CM | POA: Diagnosis not present

## 2023-06-19 DIAGNOSIS — I10 Essential (primary) hypertension: Secondary | ICD-10-CM | POA: Diagnosis not present

## 2023-06-19 DIAGNOSIS — R0609 Other forms of dyspnea: Secondary | ICD-10-CM

## 2023-06-19 DIAGNOSIS — E785 Hyperlipidemia, unspecified: Secondary | ICD-10-CM

## 2023-06-19 DIAGNOSIS — Q796 Ehlers-Danlos syndrome, unspecified: Secondary | ICD-10-CM

## 2023-06-19 DIAGNOSIS — R0602 Shortness of breath: Secondary | ICD-10-CM

## 2023-06-19 DIAGNOSIS — R0789 Other chest pain: Secondary | ICD-10-CM | POA: Diagnosis not present

## 2023-06-19 DIAGNOSIS — G4733 Obstructive sleep apnea (adult) (pediatric): Secondary | ICD-10-CM

## 2023-06-19 LAB — COMPLETE METABOLIC PANEL WITH GFR
AG Ratio: 2.4 (calc) (ref 1.0–2.5)
ALT: 14 U/L (ref 6–29)
AST: 17 U/L (ref 10–35)
Albumin: 4.5 g/dL (ref 3.6–5.1)
Alkaline phosphatase (APISO): 57 U/L (ref 37–153)
BUN: 17 mg/dL (ref 7–25)
CO2: 32 mmol/L (ref 20–32)
Calcium: 9.2 mg/dL (ref 8.6–10.4)
Chloride: 103 mmol/L (ref 98–110)
Creat: 0.8 mg/dL (ref 0.50–1.05)
Globulin: 1.9 g/dL (ref 1.9–3.7)
Glucose, Bld: 78 mg/dL (ref 65–99)
Potassium: 4.3 mmol/L (ref 3.5–5.3)
Sodium: 141 mmol/L (ref 135–146)
Total Bilirubin: 0.6 mg/dL (ref 0.2–1.2)
Total Protein: 6.4 g/dL (ref 6.1–8.1)
eGFR: 82 mL/min/{1.73_m2} (ref >=60)

## 2023-06-19 LAB — CBC WITH DIFFERENTIAL/PLATELET
Absolute Lymphocytes: 1043 {cells}/uL (ref 850–3900)
Absolute Monocytes: 343 {cells}/uL (ref 200–950)
Basophils Absolute: 9 {cells}/uL (ref 0–200)
Basophils Relative: 0.2 %
Eosinophils Absolute: 48 {cells}/uL (ref 15–500)
Eosinophils Relative: 1.1 %
HCT: 39.7 % (ref 35.0–45.0)
Hemoglobin: 12.8 g/dL (ref 11.7–15.5)
MCH: 28.6 pg (ref 27.0–33.0)
MCHC: 32.2 g/dL (ref 32.0–36.0)
MCV: 88.8 fL (ref 80.0–100.0)
MPV: 10.4 fL (ref 7.5–12.5)
Monocytes Relative: 7.8 %
Neutro Abs: 2957 {cells}/uL (ref 1500–7800)
Neutrophils Relative %: 67.2 %
Platelets: 197 10*3/uL (ref 140–400)
RBC: 4.47 10*6/uL (ref 3.80–5.10)
RDW: 12.1 % (ref 11.0–15.0)
Total Lymphocyte: 23.7 %
WBC: 4.4 10*3/uL (ref 3.8–10.8)

## 2023-06-19 NOTE — Progress Notes (Signed)
CBC and CMP are normal.

## 2023-06-19 NOTE — Progress Notes (Signed)
Cardiology Office Note    Date:  06/21/2023   ID:  Nicole Oneill, DOB 09-10-1960, MRN [id number]  PCP:  [name], MD  Cardiologist:  [name], MD   12 month F/U   History of Present Illness:  AVRIEL Oneill is a 62 y.o. female  who I had initially seen in 2007, and saw again in 2015.  I saw her on June 01, 2019  following her ER and overnight hospitalization for chest pain and last saw her on June 18, 2022.  She presents for 1 year follow-up evaluation.  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  I had seen her in 2007 when she was experiencing episodes of palpitations and vague chest pain.  Prior to that visit, she had undergone numerous orthopedic procedures with cervical as well as lumbar fusions in addition to knee replacement.  An echo Doppler study in November 2007 revealed normal LV size and function.  She had mild mitral annular calcification.  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  There was trivial tricuspid regurgitation.  Her aortic root dimension was normal and measured 2.3 cm in diameter.  A nuclear perfusion study showed normal perfusion in all regions.  A cardiac monitor showed occasional episodes of sinus tachycardia as well as episodes of PACs, and short bursts of atrial tachycardia without ventricular ectopy.   I saw her when she reestablished with me in 2015 and at that time she had at least 18 surgeries in the last 12 years and had recently undergone underwent complete fusion of her back at [facility].  Due to her hypermobility, osteoarthritis, intervertebral  disc disease and joint pains she ultimately was referred by Dr Nicole Oneill to [name] at [facility].  She was diagnosed with Ehlers-Danlos syndrome, hypermobility type III .  She also was seen in the genetics  clinic evaluation.  Because of the association of aortic root dilatation.  She now presents for cardiologic reevaluation.   When I saw her in 2015, she denied any chest pain and was unaware of any palpitations, presyncope or syncope.  It was recommended by [name] that she take take vitamin D and calcium supplements, vitamin C 1000 mg daily , which aids in the cross-linking of collagen fibrils and prolongs life of normal collagen, Epsom salt baths when she has musculoskeletal pain as well as potential oral magnesium supplementation.  From a cardiac standpoint, Nicole Oneill had been doing pretty well but admits that she often gets short of breath with minimal activity but she is not been able to exercise significantly and this is not any change.  On November 9 she was leaning forward doing Pilates exercise when she began to develop substernal chest pain.  She stopped the exercise and drove home.  Her chest pain increased and radiated to her left arm.  She also noticed significant diaphoresis and developed nausea and vomiting.  Due to progression to severe chest tightness EMS was contacted and she was taken to the emergency room.  Due to her [name]- Danlos history she underwent CT imaging which did not disclose any abnormality of her aorta and there was no evidence for dissection.  High-sensitivity troponins were minimally increased in a flat plateau at 43.> 53>42> 34.   Her ECG did not reveal any ST abnormality but showed QS  complex anteroseptally.  She was seen by [name] and subsequently underwent CTA imaging and was found to have a calcium score of 38 which was 81st percentile for age and sex matched control.  There was minimal nonobstructive plaque of 0 to 24% at the ostium of her large dominant RCA.  Remaining vessels were without any plaque.  A 2D echo Doppler study done on May 18, 2019 showed hyperdynamic LV function with an EF estimated 65 to 70% without focal segmental wall motion abnormalities.   There was trace mitral regurgitation and trace tricuspid regurgitation.  Her aorta was normal dimension and there was no evidence for dilation or obstruction.  During her hospitalization lipid studies were notable for total cholesterol of 221, HDL 78, LDL 133 and triglycerides 52.  She was advised to take Crestor 5 mg 3 times per week.  Subsequent to her ER evaluation she was seen by [name] who advised cardiology follow-up and raised concern for possible Takotsubo cardiomyopathy.    I saw her for reestablishment of cardiology care on June 01, 2019.  At that time she denied any recurrent chest pain symptomatology.  She had been on hormone replacement therapy for over 10 years but stopped therapy approximately 4 months ago.  She continued to experience mild shortness of breath with walking.  At that time she also stated that she had seen Dr. [name] for obstructive sleep apnea and was previously diagnosed of having mild OSA.  She has had issues with a comfortable mask.  During her evaluation, I thoroughly reviewed her image studies including her echo Doppler and CTA evaluations.  Her Doppler study showed hyperdynamic LV function without wall motion abnormalities.  There was no evidence for apical ballooning or apical hypocontractility which I felt argued against Takotsubo cardiomyopathy.  Her coronary CTA revealed mild coronary calcification which I reviewed with her in detail.  With her LDL of 133 I recommended aggressive lipid-lowering therapy in order to achieve a target less than 70 in an attempt to hopefully induce plaque stability and regression.  I recommended she titrate her Crestor from 5 mg 3 times a week to every other day and if tolerates to daily.  Since her blood pressure was elevated I added low-dose amlodipine 2.5 mg daily both for optimal blood pressure as well as potential vasospasm benefit.  I also discussed new mask technology and suggested she discussed the consideration of  a ResMed AirFit N 30i mask with [name].  I saw her September 10, 2019 and since her prior evaluation she had felt well.  She specifically denied any chest pain PND orthopnea and was unaware of palpitations.  .  She apparently stopped taking simvastatin and started atorvastatin but has been taking this 10 mg daily.  She underwent repeat lipid studies on August 30, 2018 which shows marked improvement with total cholesterol now 128.  Her LDL cholesterol has improved from 133 down to 48.  I saw her on September 10, 2019.  Over the prior 6 months she had remained fairly stable.  She was doing Pilates approximately 4-5 times per week.    She has had some issues with her right knee and she is status post prior left knee replacement.  There was some concern for possible sciatica down her right leg.  She sees Dr. Marta Lamas.  She continues to be on amlodipine 2.5 mg daily and atorvastatin 10 mg.  She takes gabapentin for nerve pain.  During that evaluation her blood pressure was  stable.  She was tolerating atorvastatin.  I discussed sleep apnea and potential adverse cardiovascular consequences.  After not having seen her in 33 months, I last saw her on December for, 2023.  She had undergone a follow-up sleep evaluation on June 05, 2021 by Dr. Craige Cotta which confirmed moderate overall sleep apnea with an AHI of 26.6/h.  In the supine position, sleep apnea was severe with AHI of 65.6.  She had significant oxygen desaturation to 77%.  She previously had seen [name] who had retired.  Presently she does admit to shortness of breath when walking up steps.  She had undergone PFTs.  She does Pilates at least 2 times per week.  She has issues with the extensive fusion of her back of C5-S1 and has some hip problems which she also sees Dr. Darrick Penna.  She continues to be on low-dose amlodipine at 2.5 mg for blood pressure control.  A calcium score May 18, 2019 was 54 agaston and units representing 81 percentile for age and sex  matched control.  She is on atorvastatin at 10 mg.  LDL cholesterol in October 2022 was 82 and she is followed by Dr. Darcus Austin.  She continues to be active and was doing Pilates at least 2 times per week without chest pain development.  Aggressive lipid management was recommended.  Since I last saw her, she has had had issues and apparently in September 2024 experienced a sensation of a curtain over her left eye, her pupil became dilated.  She ultimately went to bed and by morning this had normalized.  1 week later she had undergone eyelid surgery.  Carotid Dopplers were stable.  Due to joint discomfort, she had reduced her atorvastatin dose.  Recently, she has noticed that her heart rate increases when she walks up a hill and she has noticed more shortness of breath and at times notes a sensation of a chest pressure.  She is on amlodipine 2.5 mg daily.  She is on reduced dose Lipitor at 10 mg.  She takes albuterol as needed for ADD.  She recently underwent laboratory in December 2024 which showed total cholesterol 171, triglycerides 44, HDL 97, and LDL 65.  CBC was 12.8 hematocrit 39.7.  Liver transaminases were normal.  Creatinine was stable at 0.80.  She presents for cardiology evaluation.  She sees Dr. Craige Cotta for sleep apnea.  An Epworth scale calculated at 3 today.  Past Medical History:  Diagnosis Date   Anemia    Arthritis    osteo   Atrial tachycardia (HCC)    x 10 years ago wias diagnosed with short runs of atrial tachycardia while wearing a heart monitor.  No problems since.   CAD (coronary artery disease)    noted on coronary CTA in 05/2019   Chronic dyspnea    Complication of anesthesia    urinary retention requiring indwelling  foley catheterization   DDD (degenerative disc disease)    Ehlers-Danlos syndrome type III    Fibromyalgia    History of blood transfusion    Hyperlipidemia    Hypertension    Osteoarthritis    Sleep apnea, obstructive    Thoracic outlet syndrome 07/2020    per patient     Past Surgical History:  Procedure Laterality Date   BACK SURGERY     Cervical fusion C5-7, fusion  T10-S1   DIAGNOSTIC LAPAROSCOPY     x 3   EYE SURGERY     bilateral tear duct fusion   JOINT  REPLACEMENT     partial left knee 2005, total left knee 2012   KNEE ARTHROPLASTY     KNEE ARTHROSCOPY  08/06/2012   Procedure: ARTHROSCOPY KNEE;  Surgeon: Loanne Drilling, MD;  Location: [facility];  Service: Orthopedics;  Laterality: Left;  WITH SYNOVECTOMY and debridement   OSTEOTOMY     bilateral tibial    Current Medications: Outpatient Medications Prior to Visit  Medication Sig Dispense Refill   amLODipine (NORVASC) 2.5 MG tablet TAKE 1 TABLET BY MOUTH EVERY DAY 30 tablet 0   amphetamine-dextroamphetamine (ADDERALL) 5 MG tablet TAKE 1 TABLET BY MOUTH EVERY DAY AS NEEDED FOR ATTENTION     atorvastatin (LIPITOR) 10 MG tablet Take 1 tablet (10 mg total) by mouth daily. 90 tablet 3   Calcium Carbonate-Vitamin D (CALTRATE 600+D PO) Take 1 tablet by mouth daily.      Cholecalciferol (VITAMIN D3) 2000 units capsule Take 2,000 Units by mouth daily.     dextroamphetamine (DEXTROSTAT) 5 MG tablet Take 5 mg by mouth as needed.     doxycycline (VIBRA-TABS) 100 MG tablet Take 100 mg by mouth daily.     estradiol (ESTRACE) 0.1 MG/GM vaginal cream Place 1 Applicatorful vaginally at bedtime.     Famotidine (PEPCID PO) Take by mouth.     L-THREONINE PO Take by mouth.     meloxicam (MOBIC) 15 MG tablet Take 1 tablet (15 mg total) by mouth daily. 30 tablet 5   methocarbamol (ROBAXIN) 750 MG tablet TAKE 1 TABLET(750 MG) BY MOUTH EVERY DAY AT BEDTIME AS NEEDED FOR MUSCLE SPASM 30 tablet 2   mupirocin ointment (BACTROBAN) 2 % 1 application Externally Twice a day for 5 days     Polyethyl Glycol-Propyl Glycol 0.4-0.3 % SOLN Apply to eye.     polyethylene glycol powder (GLYCOLAX/MIRALAX) 17 GM/SCOOP powder Take 1 Container by mouth as needed.     Probiotic Product (PROBIOTIC PO) Take 1 capsule by  mouth daily.     Pyridoxine HCl (VITAMIN B-6 PO) Take by mouth.     Simethicone (GAS-X PO) Gas Relief     zolpidem (AMBIEN) 5 MG tablet TAKE 1 TABLET(5 MG) BY MOUTH AT BEDTIME AS NEEDED FOR SLEEP 30 tablet 0   gabapentin (NEURONTIN) 300 MG capsule TAKE 1 CAPSULE BY MOUTH EVERY MORNING THEN 1 CAPSULE AT MIDDAY THEN 2 CAPSULES AT BEDTIME 360 capsule 1   amoxicillin-clavulanate (AUGMENTIN) 875-125 MG tablet TAKE 1 TABLET EVERY 12 HOURS BY MOUTH FOR 10 DAYS.     colchicine 0.6 MG tablet Take 1 tablet (0.6 mg total) by mouth 2 (two) times daily. 60 tablet 1   gabapentin (NEURONTIN) 100 MG capsule Take 100-200 mg by mouth 2 (two) times daily as needed.     predniSONE (DELTASONE) 10 MG tablet 6 tabs po day 1, 5 tabs po day 2, 4 tabs po day 3, 3 tabs po day 4, 2 tabs po day 5, 1 tab po day 6 21 tablet 0   No facility-administered medications prior to visit.     Allergies:   Patient has no known allergies.   Social History   Socioeconomic History   Marital status: Married    Spouse name: [name]   Number of children: 3   Years of education: BA   Highest education level: Not on file  Occupational History    Employer: [employer]    Comment: [employer]   Tobacco Use   Smoking status: Never    Passive exposure: Never  Smokeless tobacco: Never  Vaping Use   Vaping status: Never Used  Substance and Sexual Activity   Alcohol use: Yes    Alcohol/week: 3.0 standard drinks of alcohol    Types: 3 Glasses of wine per week   Drug use: No   Sexual activity: Not on file  Other Topics Concern   Not on file  Social History Narrative   Patient lives at home with family.   Caffeine Use: 4-5 sodas weekly   Social Drivers of Corporate investment banker Strain: Not on file  Food Insecurity: Not on file  Transportation Needs: Not on file  Physical Activity: Not on file  Stress: Not on file  Social Connections: Not on file     Family History:  The patient's family history includes  Anorexia nervosa in her sister; Atrial fibrillation in her father; Cancer - Colon in her paternal grandfather and paternal grandmother; Cancer - Prostate in her father; Congestive Heart Failure in her father; Healthy in her daughter, daughter, and daughter; Heart attack in her maternal grandmother; Hypertension in her father; Stroke in her maternal grandfather and mother.  She believes her mother most likely had Erler's Danlos and sister also has Erler's Danlos syndrome.  ROS General: Negative; No fevers, chills, or night sweats; Ehlers-Danlos syndrome HEENT: Negative; No changes in vision or hearing, sinus congestion, difficulty swallowing Pulmonary: Negative; No cough, wheezing, shortness of breath, hemoptysis Cardiovascular: See HPI GI: Negative; No nausea, vomiting, diarrhea, or abdominal pain GU: Negative; No dysuria, hematuria, or difficulty voiding Musculoskeletal: Significant joint issues with multiple back surgeries and ultimate complete fusion of her back from T1-S1 done at [facility] Hematologic/Oncology: Negative; no easy bruising, bleeding Endocrine: Negative; no heat/cold intolerance; no diabetes Neuro: Negative; no changes in balance, headaches Skin: Negative; No rashes or skin lesions Psychiatric: Negative; No behavioral problems, depression Sleep: Positive for OSA and has been on CPAP therapy evaluated by [name]. no daytime sleepiness, hypersomnolence, bruxism, restless legs, hypnogognic hallucinations, no cataplexy Other comprehensive 14 point system review is negative.   PHYSICAL EXAM:   VS:  BP 114/74   Pulse 69   Ht 5' 5.5" (1.664 m)   Wt 135 lb 9.6 oz (61.5 kg)   SpO2 95%   BMI 22.22 kg/m     Repeat blood pressure by me was 112/70  Wt Readings from Last 3 Encounters:  06/19/23 135 lb 9.6 oz (61.5 kg)  05/30/23 125 lb (56.7 kg)  04/17/23 130 lb (59 kg)    General: Alert, oriented, no distress.  Skin: normal turgor, no rashes, warm and dry HEENT: Normocephalic,  atraumatic. Pupils equal round and reactive to light; sclera anicteric; extraocular muscles intact; Nose without nasal septal hypertrophy Mouth/Parynx benign; Mallinpatti scale 2 Neck: No JVD, no carotid bruits; normal carotid upstroke Lungs: clear to ausculatation and percussion; no wheezing or rales Chest wall: without tenderness to palpitation Heart: PMI not displaced, RRR, s1 s2 normal, 1/6 systolic murmur, no diastolic murmur, no rubs, gallops, thrills, or heaves Abdomen: soft, nontender; no hepatosplenomehaly, BS+; abdominal aorta nontender and not dilated by palpation. Back: no CVA tenderness Pulses 2+ Musculoskeletal: full range of motion, normal strength, no joint deformities Extremities: no clubbing cyanosis or edema, Homan's sign negative  Neurologic: grossly nonfocal; Cranial nerves grossly wnl Psychologic: Normal mood and affect   Studies/Labs Reviewed:   EKG Interpretation Date/Time:  Wednesday June 19 2023 10:43:39 EST Ventricular Rate:  69 PR Interval:  194 QRS Duration:  72 QT Interval:  366 QTC Calculation:  392 R Axis:   31  Text Interpretation: Normal sinus rhythm Septal infarct , age undetermined When compared with ECG of 18-May-2019 12:09, PREVIOUS ECG IS PRESENT Confirmed by [name] ([id number]) on 06/19/2023 11:28:20 AM    June 11, 2022 ECG (independently read by me): NSR at 67, PRWP V1-3  March 09, 2020  ECG (independently read by me): NSR at 60; no ST changes, normal intervals    September 10, 2019  ECG (independently read by me): Normal sinus rhythm at 64 bpm.  QS complex V1 V2.  Nonspecific T wave abnormality in lead III.  Low voltage.  No ectopy.  June 01, 2019 ECG (independently read by me): Sinus bradycardia 58 bpm.  QS complex V1 V2 V3, not significantly changed from her ER tracing of May 18, 2019.  Normal intervals.  No ectopy  Recent Labs:    Latest Ref Rng & Units 06/18/2023   10:43 AM 01/31/2022   10:00 AM 08/03/2021    1:54 PM   BMP  Glucose 65 - 99 mg/dL 78  71  604   BUN 7 - 25 mg/dL 17  17  17    Creatinine 0.50 - 1.05 mg/dL 5.40  9.81  1.91   BUN/Creat Ratio 6 - 22 (calc) SEE NOTE:  NOT APPLICABLE  NOT APPLICABLE   Sodium 135 - 146 mmol/L 141  145  142   Potassium 3.5 - 5.3 mmol/L 4.3  4.6  4.2   Chloride 98 - 110 mmol/L 103  107  104   CO2 20 - 32 mmol/L 32  32  33   Calcium 8.6 - 10.4 mg/dL 9.2  9.1  8.9         Latest Ref Rng & Units 06/18/2023   10:43 AM 01/31/2022   10:00 AM 08/03/2021    1:54 PM  Hepatic Function  Total Protein 6.1 - 8.1 g/dL 6.4  6.6  5.9   AST 10 - 35 U/L 17  18  17    ALT 6 - 29 U/L 14  19  14    Total Bilirubin 0.2 - 1.2 mg/dL 0.6  0.5  0.4        Latest Ref Rng & Units 06/18/2023   10:43 AM 01/31/2022   10:00 AM 08/03/2021    1:54 PM  CBC  WBC 3.8 - 10.8 Thousand/uL 4.4  4.6  5.4   Hemoglobin 11.7 - 15.5 g/dL 47.8  29.5  62.1   Hematocrit 35.0 - 45.0 % 39.7  38.1  35.2   Platelets 140 - 400 Thousand/uL 197  178  197    Lab Results  Component Value Date   MCV 88.8 06/18/2023   MCV 88.0 01/31/2022   MCV 86.5 08/03/2021   No results found for: "TSH" No results found for: "HGBA1C"   BNP No results found for: "BNP"  ProBNP    Component Value Date/Time   PROBNP 67 03/24/2021 1533     Lipid Panel     Component Value Date/Time   CHOL 171 06/14/2023 0947   TRIG 44 06/14/2023 0947   HDL 97 06/14/2023 0947   CHOLHDL 1.8 06/14/2023 0947   CHOLHDL 2.8 05/19/2019 0504   VLDL 10 05/19/2019 0504   LDLCALC 65 06/14/2023 0947   LABVLDL 9 06/14/2023 0947     RADIOLOGY: No results found.    Additional studies/ records that were reviewed today include:   ECHO 05/18/2019 IMPRESSIONS    1. Left ventricular ejection fraction, by visual estimation, is 65 to  70%. The left ventricle has hyperdynamic function. There is no left ventricular hypertrophy.  2. Global right ventricle has normal systolic function.The right ventricular size is normal. No increase in  right ventricular wall thickness.  3. Left atrial size was normal.  4. Right atrial size was normal.  5. The mitral valve is normal in structure. Trace mitral valve regurgitation. No evidence of mitral stenosis.  6. The tricuspid valve is normal in structure. Tricuspid valve regurgitation is trivial.  7. The aortic valve is normal in structure. Aortic valve regurgitation is not visualized. No evidence of aortic valve sclerosis or stenosis.  8. The pulmonic valve was normal in structure. Pulmonic valve regurgitation is not visualized.  9. The inferior vena cava is normal in size with greater than 50% respiratory variability, suggesting right atrial pressure of 3 mmHg.    CORONARY CTA IMPRESSION: 05/19/2019 1. Coronary calcium score of 38. This was 64 percentile for age and sex matched control.   2. Normal coronary origin with right dominance.   3. CAD-RADS 1. Minimal non-obstructive CAD (0-24%). Consider non-atherosclerotic causes of chest pain. Consider preventive therapy and risk factor modification.    ASSESSMENT:    1. Coronary artery calcification   2. Chest tightness   3. Exertional dyspnea   4. Primary hypertension   5. Hyperlipidemia with target LDL less than 70   6. Ehlers-Danlos syndrome   7. OSA (obstructive sleep apnea)     PLAN:  Ms. Trinetta Freytes is a very pleasant 62 year old female who has a history of Erler's Danlos type III and has had numerous orthopedic issues contributed by hypermobility as well as disc disease.  She developed an episode of severe substernal chest tightness associated with left arm radiation, diaphoresis, nausea and vomiting worrisome for potential ischemic etiology leading to a hospital evaluation in November 2020.  Overnight hospitalization revealed a minimally increased flat high-sensitivity troponin curve more suggestive of possible demand ischemia rather than ACS.  However, it appears that her symptoms first occurred when she was leaning over  doing a Pilates move and lifting raising some concern for possible transient vasomotor spasm contributing to her symptomatology.  Her echo Doppler study revealed hyperdynamic LV function without wall motion abnormalities. There was no evidence for apical ballooning or apical hypocontractility arguing against Takotsubo cardiomyopathy.  CTA demonstrated mild coronary calcification involving the ostium of her RCA and her calcium score was mildly increased at 38.  When I saw her in July 2020 I recommended aggressive lipid-lowering therapy.  I further titrated her rosuvastatin and apparently after 2 weeks this was changed to atorvastatin 10 mg daily.  Subsequent laboratory demonstrated markedly improved  LDL cholesterol down to 48 compared to 133.  I have not seen her in over 2-1/2 years.  She had been followed by [name] for sleep apnea and was reevaluated on June 05, 2021 by Dr. Craige Cotta which confirmed moderate overall sleep apnea however this was severe with supine sleep and she had significant oxygen desaturation to a nadir of 77%.  Since my last evaluation 1 year ago, she has noticed some change in symptomatology.  Specifically, she has noticed more exertional shortness of breath particularly when walking uphill and also at times has noticed some chest pressure sensation.  I have recommended we obtain a follow-up echo Doppler study for reassessment of LV systolic and diastolic function and valvular architecture.  I have also recommended nuclear PET stress imaging for assessment of coronary perfusion and potential ischemia.  Repeat laboratory in the  fasting state with comprehensive metabolic panel, fasting lipid panel and LP(a) was recommended.  I will see her in March/April for follow-up evaluation.   Medication Adjustments/Labs and Tests Ordered: Current medicines are reviewed at length with the patient today.  Concerns regarding medicines are outlined above.  Medication changes, Labs and Tests ordered  today are listed in the Patient Instructions below. Patient Instructions  Medication Instructions:  The current medical regimen is effective;  continue present plan and medications as directed. Please refer to the Current Medication list given to you today.   *If you need a refill on your cardiac medications before your next appointment, please call your pharmacy*   Lab Work: RETURN IN MARCH FOR FASTING LABS If you have labs (blood work) drawn today and your tests are completely normal, you will receive your results only by: MyChart Message (if you have MyChart) OR A paper copy in the mail If you have any lab test that is abnormal or we need to change your treatment, we will call you to review the results.   Testing/Procedures: Your physician has requested that you have an echocardiogram. Echocardiography is a painless test that uses sound waves to create images of your heart. It provides your doctor with information about the size and shape of your heart and how well your heart's chambers and valves are working. This procedure takes approximately one hour. There are no restrictions for this procedure. Please do NOT wear cologne, perfume, aftershave, or lotions (deodorant is allowed). Please arrive 15 minutes prior to your appointment time.  Please note: We ask at that you not bring children with you during ultrasound (echo/ vascular) testing. Due to room size and safety concerns, children are not allowed in the ultrasound rooms during exams. Our front office staff cannot provide observation of children in our lobby area while testing is being conducted. An adult accompanying a patient to their appointment will only be allowed in the ultrasound room at the discretion of the ultrasound technician under special circumstances. We apologize for any inconvenience.       Please report to Radiology at the [facility] Main Entrance 30 minutes early for your test.  [address]  [place], [place] [address]                         OR   Please report to Radiology at [facility] Main Entrance, medical mall, 30 mins prior to your test.  [address]  [place], [place]  [phone]  How to Prepare for Your Cardiac PET/CT Stress Test:  Nothing to eat or drink, except water, 3 hours prior to arrival time.  NO caffeine/decaffeinated products, or chocolate 12 hours prior to arrival. (Please note decaffeinated beverages (teas/coffees) still contain caffeine).  If you have caffeine within 12 hours prior, the test will need to be rescheduled.  Medication instructions: Do not take nitrates (isosorbide mononitrate, Ranexa) the day before or day of test Do not take tamsulosin the day before or morning of test Hold theophylline containing medications for 12 hours. Hold Dipyridamole 48 hours prior to the test.  Diabetic Preparation: If able to eat breakfast prior to 3 hour fasting, you may take all medications, including your insulin. Do not worry if you miss your breakfast dose of insulin - start at your next meal. If you do not eat prior to 3 hour fast-Hold all diabetes (oral and insulin) medications. Patients who wear a continuous  glucose monitor MUST remove the device prior to scanning.  You may take your remaining medications with water.  NO perfume, cologne or lotion on chest or abdomen area. FEMALES - Please avoid wearing dresses to this appointment.  Total time is 1 to 2 hours; you may want to bring reading material for the waiting time.  IF YOU THINK YOU MAY BE PREGNANT, OR ARE NURSING PLEASE INFORM THE TECHNOLOGIST.  In preparation for your appointment, medication and supplies will be purchased.  Appointment availability is limited, so if you need to cancel or reschedule, please call the Radiology Department at [phone] [facility]) OR [phone] [facility]) 24 hours in advance to avoid a cancellation fee of $100.00  What to Expect  When you Arrive:  Once you arrive and check in for your appointment, you will be taken to a preparation room within the Radiology Department.  A technologist or Nurse will obtain your medical history, verify that you are correctly prepped for the exam, and explain the procedure.  Afterwards, an IV will be started in your arm and electrodes will be placed on your skin for EKG monitoring during the stress portion of the exam. Then you will be escorted to the PET/CT scanner.  There, staff will get you positioned on the scanner and obtain a blood pressure and EKG.  During the exam, you will continue to be connected to the EKG and blood pressure machines.  A small, safe amount of a radioactive tracer will be injected in your IV to obtain a series of pictures of your heart along with an injection of a stress agent.    After your Exam:  It is recommended that you eat a meal and drink a caffeinated beverage to counter act any effects of the stress agent.  Drink plenty of fluids for the remainder of the day and urinate frequently for the first couple of hours after the exam.  Your doctor will inform you of your test results within 7-10 business days.  For more information and frequently asked questions, please visit our website: [email or web address]  For questions about your test or how to prepare for your test, please call: Cardiac Imaging Nurse Navigators Office: [phone]   .   Follow-Up: At [facility], you and your health needs are our priority.  As part of our continuing mission to provide you with exceptional heart care, we have created designated Provider Care Teams.  These Care Teams include your primary Cardiologist (physician) and Advanced Practice Providers (APPs -  Physician Assistants and Nurse Practitioners) who all work together to provide you with the care you need, when you need it.  We recommend signing up for the patient portal called "MyChart".  Sign up  information is provided on this After Visit Summary.  MyChart is used to connect with patients for Virtual Visits (Telemedicine).  Patients are able to view lab/test results, encounter notes, upcoming appointments, etc.  Non-urgent messages can be sent to your provider as well.   To learn more about what you can do with MyChart, go to [email or web address].    Your next appointment:   4 month(s)  Provider:   [name], MD        Signed, [name], MD  06/21/2023 1:21 PM    [facility] [address], [address], [place], [place]  [address] Phone: [phone]

## 2023-06-19 NOTE — Patient Instructions (Signed)
Medication Instructions:  The current medical regimen is effective;  continue present plan and medications as directed. Please refer to the Current Medication list given to you today.   *If you need a refill on your cardiac medications before your next appointment, please call your pharmacy*   Lab Work: RETURN IN MARCH FOR FASTING LABS If you have labs (blood work) drawn today and your tests are completely normal, you will receive your results only by: MyChart Message (if you have MyChart) OR A paper copy in the mail If you have any lab test that is abnormal or we need to change your treatment, we will call you to review the results.   Testing/Procedures: Your physician has requested that you have an echocardiogram. Echocardiography is a painless test that uses sound waves to create images of your heart. It provides your doctor with information about the size and shape of your heart and how well your heart's chambers and valves are working. This procedure takes approximately one hour. There are no restrictions for this procedure. Please do NOT wear cologne, perfume, aftershave, or lotions (deodorant is allowed). Please arrive 15 minutes prior to your appointment time.  Please note: We ask at that you not bring children with you during ultrasound (echo/ vascular) testing. Due to room size and safety concerns, children are not allowed in the ultrasound rooms during exams. Our front office staff cannot provide observation of children in our lobby area while testing is being conducted. An adult accompanying a patient to their appointment will only be allowed in the ultrasound room at the discretion of the ultrasound technician under special circumstances. We apologize for any inconvenience.       Please report to Radiology at the [facility] Main Entrance 30 minutes early for your test.  [address] [place], [place] [address]                         OR   Please report to  Radiology at [facility] Main Entrance, medical mall, 30 mins prior to your test.  [address]  [place], [place]  [phone]  How to Prepare for Your Cardiac PET/CT Stress Test:  Nothing to eat or drink, except water, 3 hours prior to arrival time.  NO caffeine/decaffeinated products, or chocolate 12 hours prior to arrival. (Please note decaffeinated beverages (teas/coffees) still contain caffeine).  If you have caffeine within 12 hours prior, the test will need to be rescheduled.  Medication instructions: Do not take nitrates (isosorbide mononitrate, Ranexa) the day before or day of test Do not take tamsulosin the day before or morning of test Hold theophylline containing medications for 12 hours. Hold Dipyridamole 48 hours prior to the test.  Diabetic Preparation: If able to eat breakfast prior to 3 hour fasting, you may take all medications, including your insulin. Do not worry if you miss your breakfast dose of insulin - start at your next meal. If you do not eat prior to 3 hour fast-Hold all diabetes (oral and insulin) medications. Patients who wear a continuous glucose monitor MUST remove the device prior to scanning.  You may take your remaining medications with water.  NO perfume, cologne or lotion on chest or abdomen area. FEMALES - Please avoid wearing dresses to this appointment.  Total time is 1 to 2 hours; you may want to bring reading material for the waiting time.  IF YOU THINK YOU MAY BE PREGNANT, OR  ARE NURSING PLEASE INFORM THE TECHNOLOGIST.  In preparation for your appointment, medication and supplies will be purchased.  Appointment availability is limited, so if you need to cancel or reschedule, please call the Radiology Department at [phone] [facility]) OR [phone] [facility]) 24 hours in advance to avoid a cancellation fee of $100.00  What to Expect When you Arrive:  Once you arrive and check in for your appointment, you  will be taken to a preparation room within the Radiology Department.  A technologist or Nurse will obtain your medical history, verify that you are correctly prepped for the exam, and explain the procedure.  Afterwards, an IV will be started in your arm and electrodes will be placed on your skin for EKG monitoring during the stress portion of the exam. Then you will be escorted to the PET/CT scanner.  There, staff will get you positioned on the scanner and obtain a blood pressure and EKG.  During the exam, you will continue to be connected to the EKG and blood pressure machines.  A small, safe amount of a radioactive tracer will be injected in your IV to obtain a series of pictures of your heart along with an injection of a stress agent.    After your Exam:  It is recommended that you eat a meal and drink a caffeinated beverage to counter act any effects of the stress agent.  Drink plenty of fluids for the remainder of the day and urinate frequently for the first couple of hours after the exam.  Your doctor will inform you of your test results within 7-10 business days.  For more information and frequently asked questions, please visit our website: [email or web address]  For questions about your test or how to prepare for your test, please call: Cardiac Imaging Nurse Navigators Office: [phone]   .   Follow-Up: At [facility], you and your health needs are our priority.  As part of our continuing mission to provide you with exceptional heart care, we have created designated Provider Care Teams.  These Care Teams include your primary Cardiologist (physician) and Advanced Practice Providers (APPs -  Physician Assistants and Nurse Practitioners) who all work together to provide you with the care you need, when you need it.  We recommend signing up for the patient portal called "MyChart".  Sign up information is provided on this After Visit Summary.  MyChart is used to  connect with patients for Virtual Visits (Telemedicine).  Patients are able to view lab/test results, encounter notes, upcoming appointments, etc.  Non-urgent messages can be sent to your provider as well.   To learn more about what you can do with MyChart, go to [email or web address].    Your next appointment:   4 month(s)  Provider:   [name], MD

## 2023-06-20 ENCOUNTER — Other Ambulatory Visit: Payer: Self-pay

## 2023-06-20 MED ORDER — GABAPENTIN 300 MG PO CAPS: ORAL_CAPSULE | ORAL | 1 refills | Status: DC

## 2023-06-20 NOTE — Progress Notes (Signed)
Pt called requesting Gabapentin refill.

## 2023-06-21 ENCOUNTER — Encounter: Payer: Self-pay | Admitting: Cardiovascular Disease

## 2023-07-01 ENCOUNTER — Telehealth: Payer: Self-pay | Admitting: Cardiovascular Disease

## 2023-07-01 NOTE — Telephone Encounter (Signed)
Patient wants to know when she will need to do her lab tests.

## 2023-07-01 NOTE — Telephone Encounter (Signed)
Spoke to patient she was calling to make sure ok to wait and have Lpa done same time as other other lab Dr.Kelly ordered to have done in 09/2023.Advised ok to have all done 09/2023.

## 2023-07-16 ENCOUNTER — Ambulatory Visit: Payer: 59 | Admitting: Sports Medicine

## 2023-07-16 ENCOUNTER — Encounter: Payer: Self-pay | Admitting: Sports Medicine

## 2023-07-16 ENCOUNTER — Other Ambulatory Visit: Payer: Self-pay | Admitting: Cardiovascular Disease

## 2023-07-16 ENCOUNTER — Other Ambulatory Visit: Payer: Self-pay

## 2023-07-16 ENCOUNTER — Ambulatory Visit
Admission: RE | Admit: 2023-07-16 | Discharge: 2023-07-16 | Disposition: A | Discharge status: Home / Self Care | Payer: 59 | Source: Ambulatory Visit | Attending: Sports Medicine | Admitting: Sports Medicine

## 2023-07-16 VITALS — BP 118/80 | Ht 65.0 in | Wt 130.0 lb

## 2023-07-16 DIAGNOSIS — M25552 Pain in left hip: Secondary | ICD-10-CM

## 2023-07-16 DIAGNOSIS — S76012D Strain of muscle, fascia and tendon of left hip, subsequent encounter: Secondary | ICD-10-CM

## 2023-07-16 DIAGNOSIS — S46812A Strain of other muscles, fascia and tendons at shoulder and upper arm level, left arm, initial encounter: Secondary | ICD-10-CM | POA: Insufficient documentation

## 2023-07-16 DIAGNOSIS — M51361 Other intervertebral disc degeneration, lumbar region with lower extremity pain only: Secondary | ICD-10-CM

## 2023-07-16 DIAGNOSIS — S76019A Strain of muscle, fascia and tendon of unspecified hip, initial encounter: Secondary | ICD-10-CM | POA: Insufficient documentation

## 2023-07-16 MED ORDER — METHYLPREDNISOLONE ACETATE 40 MG/ML IJ SUSP
40.0000 mg | Freq: Once | INTRAMUSCULAR | Status: AC
Start: 2023-07-16 — End: 2023-07-16
  Administered 2023-07-16: 40 mg via INTRA_ARTICULAR

## 2023-07-16 NOTE — Assessment & Plan Note (Signed)
-   Nicole Oneill is having ongoing pain in the gluteus medius muscle with hypoechoic changes on ultrasound indicative of some strained muscles without any definitive tearing or bursitis.  She has been unable to perform her physical therapy due to pain. - Injection given as below.  Hopefully this will give her some relief for her to perform her exercises therapy.  Tear mechanism we will start her better with gentle home exercises - Will also order an x-ray of the lumbosacral area given her radicular symptoms.  Will follow-up with her on the results of this.

## 2023-07-16 NOTE — Patient Instructions (Signed)
 Get x-rays when you leave today. Start the hip exercises in 48 hours. Follow up with Dr. Webb Silversmith for a shockwave treatment on your upper trap.

## 2023-07-16 NOTE — Assessment & Plan Note (Signed)
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 - Follow-up at next available appointment.  Hopefully this will give her some lasting relief.

## 2023-07-16 NOTE — Progress Notes (Signed)
 Nicole Oneill - 63 y.o. female MRN [id number]  Date of birth: 01/14/1961  PCP: Nicole Anes, MD  Subjective:  No chief complaint on file.  Left gluteal pain, numbness over the left knee, and left trapezius pain  HPI: Past Medical, Surgical, Social, and Family History Reviewed & Updated per EMR.   Patient is a 63 y.o. female here for follow up on left gluteal pain.  She was doing physical therapy for the knee and strengthening of the left hip abductors but had to stop due to pain over the area.  She has increased pain with prolonged sitting and rest makes it feel better.  She has not had any specific trauma to the area or falls.  She does note some pain that shoots down the back of her left leg and numbness over the medial aspect of her left knee as well.  She does have a significant history of multiple back surgeries with fusion from neck to sacrum.  She did receive lumbar injection therapy on the right side for radiculopathy in the past. She is also having some spastic pain of the left trapezius which responds minimally to manipulative therapy.  She has not had any trauma to the area or numbness/weakness of the upper extremity.  Past Medical History:  Diagnosis Date   Anemia    Arthritis    osteo   Atrial tachycardia (HCC)    x 10 years ago wias diagnosed with short runs of atrial tachycardia while wearing a heart monitor.  No problems since.   CAD (coronary artery disease)    noted on coronary CTA in 05/2019   Chronic dyspnea    Complication of anesthesia    urinary retention requiring indwelling  foley catheterization   DDD (degenerative disc disease)    Ehlers-Danlos syndrome type III    Fibromyalgia    History of blood transfusion    Hyperlipidemia    Hypertension    Osteoarthritis    Sleep apnea, obstructive    Thoracic outlet syndrome 07/2020   per patient     Current Outpatient Medications on File Prior to Visit  Medication Sig Dispense Refill   amLODipine  (NORVASC ) 2.5  MG tablet TAKE 1 TABLET BY MOUTH EVERY DAY 30 tablet 0   amphetamine-dextroamphetamine (ADDERALL) 5 MG tablet TAKE 1 TABLET BY MOUTH EVERY DAY AS NEEDED FOR ATTENTION     atorvastatin  (LIPITOR) 10 MG tablet Take 1 tablet (10 mg total) by mouth daily. 90 tablet 3   Calcium  Carbonate-Vitamin D  (CALTRATE 600+D PO) Take 1 tablet by mouth daily.      Cholecalciferol  (VITAMIN D3) 2000 units capsule Take 2,000 Units by mouth daily.     dextroamphetamine (DEXTROSTAT) 5 MG tablet Take 5 mg by mouth as needed.     doxycycline (VIBRA-TABS) 100 MG tablet Take 100 mg by mouth daily.     estradiol (ESTRACE) 0.1 MG/GM vaginal cream Place 1 Applicatorful vaginally at bedtime.     Famotidine (PEPCID PO) Take by mouth.     gabapentin  (NEURONTIN ) 300 MG capsule TAKE 1 CAPSULE BY MOUTH EVERY MORNING THEN 1 CAPSULE AT MIDDAY THEN 2 CAPSULES AT BEDTIME 360 capsule 1   L-THREONINE PO Take by mouth.     meloxicam  (MOBIC ) 15 MG tablet Take 1 tablet (15 mg total) by mouth daily. 30 tablet 5   methocarbamol  (ROBAXIN ) 750 MG tablet TAKE 1 TABLET(750 MG) BY MOUTH EVERY DAY AT BEDTIME AS NEEDED FOR MUSCLE SPASM 30 tablet 2   mupirocin ointment (BACTROBAN)  2 % 1 application Externally Twice a day for 5 days     Polyethyl Glycol-Propyl Glycol 0.4-0.3 % SOLN Apply to eye.     polyethylene glycol powder (GLYCOLAX/MIRALAX) 17 GM/SCOOP powder Take 1 Container by mouth as needed.     Probiotic Product (PROBIOTIC PO) Take 1 capsule by mouth daily.     Pyridoxine HCl (VITAMIN B-6 PO) Take by mouth.     Simethicone (GAS-X PO) Gas Relief     zolpidem  (AMBIEN ) 5 MG tablet TAKE 1 TABLET(5 MG) BY MOUTH AT BEDTIME AS NEEDED FOR SLEEP 30 tablet 0   No current facility-administered medications on file prior to visit.    Past Surgical History:  Procedure Laterality Date   BACK SURGERY     Cervical fusion C5-7, fusion  T10-S1   DIAGNOSTIC LAPAROSCOPY     x 3   EYE SURGERY     bilateral tear duct fusion   JOINT REPLACEMENT      partial left knee 2005, total left knee 2012   KNEE ARTHROPLASTY     KNEE ARTHROSCOPY  08/06/2012   Procedure: ARTHROSCOPY KNEE;  Surgeon: Dempsey LULLA Moan, MD;  Location: [facility];  Service: Orthopedics;  Laterality: Left;  WITH SYNOVECTOMY and debridement   OSTEOTOMY     bilateral tibial    No Known Allergies      Objective:  Physical Exam: VS: BP:118/80  HR: bpm  TEMP: ( )  RESP:   HT:5' 5 (165.1 cm)   WT:130 lb (59 kg)  BMI:21.63  Gen: NAD, speaks clearly, comfortable in exam room Respiratory: Normal respiratory effort on room air. No signs of distress Skin: No rashes, abrasions, or ecchymosis MSK:  Observation of the scapula shows smooth movement with full abduction of the upper extremities. No spastic contraction palpated. There is some marked tightness with tenderness to palpation of the left trapezius.  Left Hip: Gate normal ROM: Full with some tightness and extension passively Strength Flexion: 5/5, Extension: 5/5, Abduction: 3/5, Adduction: 5/5 Trendelenburg sign positive on the left Greater trochanter tender to palpation posteriorly Anterior hip joint NTTP The proximal gluteus muscle is tender at the medial aspect of the iliac crest Ober's positive on the right No tenderness over piriformis.  Posterior Hip: FABER neg for SI pain FADIR neg for anterior hip pain, log roll negative  Full ROM and strength at the knee and ankle DTRs 2+ and equal bilat at the patella and achilles  Limited US  of the Left greater trochanter and gluteus muscles: hypoechoic changes within the proximal, mid belly, and distal gluteus medius with disorganization of the fibers but no definitive tears. Hypoechoic change and slight calcification at insertion of glut. Med and min. Tendons to superior GT  Impression: Gluteus Medius tendinopathy  Ultrasound and interpretation by Nicole Oneill. Fields, MD     Assessment & Plan:   Strain of gluteus medius - Nicole Oneill is having ongoing  pain in the gluteus medius muscle with hypoechoic changes on ultrasound indicative of some strained muscles without any definitive tearing or bursitis.  She has been unable to perform her physical therapy due to pain. - Injection given as below.  Hopefully this will give her some relief for her to perform her exercises therapy.  Tear mechanism we will start her better with gentle home exercises - Will also order an x-ray of the lumbosacral area given her radicular symptoms.  Will follow-up with her on the results of this.  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 - Follow-up at next available appointment.  Hopefully this will give her some lasting relief.     Lonni Ford MD [facility] Sports Medicine Fellow  I observed and examined the patient with the [facility] resident and agree with assessment and plan.  Note reviewed and modified by me.  IF she gets a good response to ESWT for her trapezius she might also consider this for the lateral hip. KB Harvey COME

## 2023-07-17 ENCOUNTER — Other Ambulatory Visit: Payer: Self-pay | Admitting: *Deleted

## 2023-07-17 ENCOUNTER — Other Ambulatory Visit: Payer: Self-pay | Admitting: Cardiovascular Disease

## 2023-07-17 MED ORDER — AMLODIPINE BESYLATE 2.5 MG PO TABS: 2.5000 mg | ORAL_TABLET | Freq: Every day | ORAL | 0 refills | Status: DC

## 2023-07-17 MED ORDER — ZOLPIDEM TARTRATE 5 MG PO TABS: 5.0000 mg | ORAL_TABLET | Freq: Every evening | ORAL | 0 refills | Status: DC | PRN

## 2023-07-17 NOTE — Telephone Encounter (Signed)
 Last Fill: 06/17/2023   Next Visit: 08/15/2023   Last Visit: 02/12/2023   Dx: Primary insomnia    Current Dose per office note on 02/12/2023: Ambien 5 mg 1 tablet at bedtime for insomnia.    Okay to refill Ambien?

## 2023-07-18 ENCOUNTER — Ambulatory Visit: Payer: 59 | Admitting: Sports Medicine

## 2023-07-18 ENCOUNTER — Other Ambulatory Visit: Payer: Self-pay | Admitting: Rheumatology

## 2023-07-18 NOTE — Telephone Encounter (Signed)
 Last Fill: 04/17/2023   Next Visit: 08/15/2023   Last Visit: 02/12/2023   Dx: DDD (degenerative disc disease), thoracic    Current Dose per office note on 02/12/2023: Robaxin  750 mg 1 tablet at bedtime as needed for muscle spasms.     Okay to refill Methocarbamol ?

## 2023-07-19 ENCOUNTER — Ambulatory Visit (INDEPENDENT_AMBULATORY_CARE_PROVIDER_SITE_OTHER): Payer: Self-pay | Admitting: Family Medicine

## 2023-07-19 DIAGNOSIS — S46812A Strain of other muscles, fascia and tendons at shoulder and upper arm level, left arm, initial encounter: Secondary | ICD-10-CM

## 2023-07-19 NOTE — Progress Notes (Signed)
 Patient returns today for shockwave treatment for trapezius spasms. She's actually having these on both sides in addition to the pain throughout her back. Pain worse on the left side however. Discussed risks/benefits of shockwave.  Procedure: ECSWT Indications:  bilateral trapezius spasms   Procedure Details Consent: Risks of procedure as well as the alternatives and risks of each were explained to the patient.  Written consent for procedure obtained. Time Out: Verified patient identification, verified procedure, site was marked, verified correct patient position, medications/allergies/relevent history reviewed.  The area was cleaned with alcohol  swab.     The left and right trapezius  was targeted for Extracorporeal shockwave therapy.    Preset: status post muscle injury Power Level: 110 Frequency: 14 Impulse/cycles: 2000 left, 1000 right trapezius Head size: large   Patient tolerated procedure well without immediate complications   Follow up in 1 week for second treatment.  Expect 4-6 treatments.

## 2023-07-24 ENCOUNTER — Ambulatory Visit (HOSPITAL_COMMUNITY): Payer: 59 | Attending: Cardiovascular Disease

## 2023-07-24 ENCOUNTER — Encounter: Payer: Self-pay | Admitting: *Deleted

## 2023-07-24 ENCOUNTER — Other Ambulatory Visit: Payer: Self-pay | Admitting: Sports Medicine

## 2023-07-24 DIAGNOSIS — R0789 Other chest pain: Secondary | ICD-10-CM | POA: Diagnosis present

## 2023-07-24 LAB — ECHOCARDIOGRAM COMPLETE
Area-P 1/2: 4.29 cm2
S' Lateral: 2.4 cm

## 2023-07-26 ENCOUNTER — Encounter: Payer: Self-pay | Admitting: Family Medicine

## 2023-07-26 ENCOUNTER — Ambulatory Visit (INDEPENDENT_AMBULATORY_CARE_PROVIDER_SITE_OTHER): Payer: Self-pay | Admitting: Family Medicine

## 2023-07-26 DIAGNOSIS — S46812A Strain of other muscles, fascia and tendons at shoulder and upper arm level, left arm, initial encounter: Secondary | ICD-10-CM

## 2023-07-26 DIAGNOSIS — S46811S Strain of other muscles, fascia and tendons at shoulder and upper arm level, right arm, sequela: Secondary | ICD-10-CM

## 2023-07-26 MED ORDER — DIAZEPAM 5 MG PO TABS: 5.0000 mg | ORAL_TABLET | Freq: Every day | ORAL | 1 refills | Status: DC

## 2023-07-26 NOTE — Progress Notes (Signed)
Patient presenting for second shockwave therapy for her bilateral trapezius.  Patient states that after the previous 1 she felt better but did note that the next day she did have some spasms.  Patient notes some mild improvement after the previous 1 and would like to go ahead and get another treatment today.  This will be the second session.  Of note, patient has been complaining of some left-sided sciatic leg pain that starts in her gluteal region and travels down the back of her leg into her knees.  Patient has an extensive lumbar spinal fusion history.  Discussed with patient that if she feels like the radiating pain/numbness continues then at the next session can consider starting her on oral steroids.  Patient is also wondering if she get a refill on her Valium because it is helping her sleep at night.  Procedure: ECSWT Indications:  Bilateral trapezius spasms   Procedure Details Consent: Risks of procedure as well as the alternatives and risks of each were explained to the patient.  Verbal consent for procedure obtained. Time Out: Verified patient identification, verified procedure, site was marked, verified correct patient position. The area was cleaned with alcohol swab.     The left and right was targeted for Extracorporeal shockwave therapy.    Preset: S/p muscle injury Power Level: 110 Frequency: 14 Impulse/cycles: 3000 left, 2000 right trapezium Head size: Regular   Patient tolerated procedure well without immediate complications.   Brenton Grills MD, PGY-4  Sports Medicine Fellow [facility]

## 2023-07-29 NOTE — Patient Instructions (Signed)

## 2023-08-01 ENCOUNTER — Ambulatory Visit (INDEPENDENT_AMBULATORY_CARE_PROVIDER_SITE_OTHER): Payer: Self-pay | Admitting: Family Medicine

## 2023-08-01 VITALS — Ht 65.0 in

## 2023-08-01 DIAGNOSIS — S46811S Strain of other muscles, fascia and tendons at shoulder and upper arm level, right arm, sequela: Secondary | ICD-10-CM

## 2023-08-01 DIAGNOSIS — S46812D Strain of other muscles, fascia and tendons at shoulder and upper arm level, left arm, subsequent encounter: Secondary | ICD-10-CM

## 2023-08-01 NOTE — Progress Notes (Signed)
 Office Visit Note  Patient: Nicole Oneill             Date of Birth: 05-04-1961           MRN: [id number]             PCP: Shayne Anes, MD Referring: Shayne Anes, MD Visit Date: 08/15/2023 Occupation: @GUAROCC @  Subjective:  Pain in multiple joints.  History of Present Illness: Nicole Oneill is a 63 y.o. female with osteoarthritis, and degenerative disc disease .  She returns today after her last visit in August 2024.  She states she had bilateral eyelid surgery September 2024 at [facility] and had good results.  She continues to have discomfort in her bilateral hands and decreased grip strength.  She has been having pain in her bilateral feet.  She continues to have discomfort in her left knee joint which is replaced.  She has been also having some discomfort in the left trochanteric region.  She states she has episodic pain which radiates down to her left lower extremity for which she is planning to see Dr. Alvis.  She has neck and upper back with stiffness.  As she continues to have dry mouth and dry eye symptoms.  She has been getting DEXA scan through Dr. Mathews in the last DEXA scan per patient showed osteopenia.  She has been taking vitamin D .  She continues to have insomnia for which she takes Ambien  5 mg p.o. nightly.  She is  unable to sleep without Ambien .  She walks for exercise and also does Pilates twice a week.  She has a stress test  coming up for routine checkup.    Activities of Daily Living:  Patient reports morning stiffness for all day. Patient Reports nocturnal pain.  Difficulty dressing/grooming: Denies Difficulty climbing stairs: Denies Difficulty getting out of chair: Denies Difficulty using hands for taps, buttons, cutlery, and/or writing: Reports  Review of Systems  Constitutional:  Positive for fatigue.  HENT:  Positive for mouth dryness and nose dryness. Negative for mouth sores.   Eyes:  Positive for dryness.  Respiratory:  Positive for shortness  of breath. Negative for difficulty breathing.   Cardiovascular:  Negative for chest pain and palpitations.  Gastrointestinal:  Negative for blood in stool, constipation and diarrhea.  Endocrine: Negative for increased urination.  Genitourinary:  Negative for involuntary urination.  Musculoskeletal:  Positive for joint pain, gait problem, joint pain, joint swelling, myalgias, muscle weakness, morning stiffness, muscle tenderness and myalgias.  Skin:  Positive for color change. Negative for rash, hair loss and sensitivity to sunlight.  Allergic/Immunologic: Negative for susceptible to infections.  Neurological:  Positive for dizziness. Negative for headaches.  Hematological:  Negative for swollen glands.  Psychiatric/Behavioral:  Positive for sleep disturbance. Negative for depressed mood. The patient is not nervous/anxious.     PMFS History:  Patient Active Problem List   Diagnosis Date Noted   Strain of gluteus medius 07/16/2023   Trapezius strain, left, initial encounter 07/16/2023   Ulnar neuropathy at elbow of right upper extremity 07/10/2022   Ulnar neuropathy of left upper extremity 05/16/2022   Damage to left ulnar nerve 02/06/2022   Nonobstructive CAD 05/04/2021   Hypertension 05/04/2021   Hyperlipidemia 05/04/2021   Shortness of breath 04/04/2021   OSA on CPAP 04/04/2021   Right shoulder pain 02/19/2019   DDD (degenerative disc disease), cervical 04/28/2018   DDD (degenerative disc disease), lumbar 10/29/2017   Sicca syndrome (HCC) 10/29/2017  Left anterior shoulder pain 11/27/2016   History of lumbar fusion 10/16/2016   History of fusion of cervical spine 10/16/2016   History of total knee arthroplasty, left 2012 10/16/2016   Primary insomnia 10/16/2016   DDD lumbar 10/15/2016   DJD (degenerative joint disease), cervical 10/15/2016   DDD (degenerative disc disease), thoracic 10/15/2016   Primary osteoarthritis of both knees 10/15/2016   Tendinopathy of right gluteus  medius 05/29/2016   Chronic lumbar radiculopathy 03/08/2016   Heart murmur, systolic 06/19/2014   Disorder of SI (sacroiliac) joint 06/02/2014   Functional gait abnormality 06/02/2014   Ehlers-Danlos syndrome type III 03/10/2014   Foot arch pain 03/10/2014   Left knee pain 05/26/2013   Primary osteoarthritis of both hands 05/26/2013   Effusion of knee joint, left 08/05/2012   Osteoarthritis of multiple joints 10/12/2010    Past Medical History:  Diagnosis Date   Anemia    Arthritis    osteo   Atrial tachycardia (HCC)    x 10 years ago wias diagnosed with short runs of atrial tachycardia while wearing a heart monitor.  No problems since.   CAD (coronary artery disease)    noted on coronary CTA in 05/2019   Chronic dyspnea    Complication of anesthesia    urinary retention requiring indwelling  foley catheterization   DDD (degenerative disc disease)    Ehlers-Danlos syndrome type III    Fibromyalgia    History of blood transfusion    Hyperlipidemia    Hypertension    Osteoarthritis    Sleep apnea, obstructive    Thoracic outlet syndrome 07/2020   per patient     Family History  Problem Relation Age of Onset   Stroke Mother        hemorrhagic   Hypertension Father    Cancer - Prostate Father    Atrial fibrillation Father    Congestive Heart Failure Father    Anorexia nervosa Sister    Heart attack Maternal Grandmother    Stroke Maternal Grandfather    Cancer - Colon Paternal Grandmother    Cancer - Colon Paternal Grandfather    Healthy Daughter    Healthy Daughter    Healthy Daughter    Past Surgical History:  Procedure Laterality Date   BACK SURGERY     Cervical fusion C5-7, fusion  T10-S1   DIAGNOSTIC LAPAROSCOPY     x 3   EYE SURGERY     bilateral tear duct fusion   EYE SURGERY Bilateral 03/19/2023   eyelid surgery and cyst removed from left tearduct   JOINT REPLACEMENT     partial left knee 2005, total left knee 2012   KNEE ARTHROPLASTY     KNEE  ARTHROSCOPY  08/06/2012   Procedure: ARTHROSCOPY KNEE;  Surgeon: Dempsey LULLA Moan, MD;  Location: [facility];  Service: Orthopedics;  Laterality: Left;  WITH SYNOVECTOMY and debridement   OSTEOTOMY     bilateral tibial   Social History   Social History Narrative   Patient lives at home with family.   Caffeine Use: 4-5 sodas weekly   Immunization History  Administered Date(s) Administered   Influenza Inj Mdck Quad Pf 04/22/2017   Influenza,inj,quad, With Preservative 04/08/2017   Moderna Sars-Covid-2 Vaccination 10/11/2019, 11/06/2019, 10/08/2020   PFIZER(Purple Top)SARS-COV-2 Vaccination 06/09/2020   Pfizer Covid-19 Vaccine Bivalent Booster 3yrs & up 03/22/2021     Objective: Vital Signs: BP 120/76 (BP Location: Left Arm, Patient Position: Sitting, Cuff Size: Normal)   Pulse 74   Resp  14   Ht 5' 5 (1.651 m)   Wt 133 lb 12.8 oz (60.7 kg)   BMI 22.27 kg/m    Physical Exam Vitals and nursing note reviewed.  Constitutional:      Appearance: She is well-developed.  HENT:     Head: Normocephalic and atraumatic.  Eyes:     Conjunctiva/sclera: Conjunctivae normal.  Cardiovascular:     Rate and Rhythm: Normal rate and regular rhythm.     Heart sounds: Normal heart sounds.  Pulmonary:     Effort: Pulmonary effort is normal.     Breath sounds: Normal breath sounds.  Abdominal:     General: Bowel sounds are normal.     Palpations: Abdomen is soft.  Musculoskeletal:     Cervical back: Normal range of motion.  Lymphadenopathy:     Cervical: No cervical adenopathy.  Skin:    General: Skin is warm and dry.     Capillary Refill: Capillary refill takes less than 2 seconds.  Neurological:     Mental Status: She is alert and oriented to person, place, and time.  Psychiatric:        Behavior: Behavior normal.      Musculoskeletal Exam: Limited range of motion of the cervical spine.  She had limited range of motion of the thoracic and lumbar spine due to previous fusion.  Shoulder  joints, elbow joints, wrist joints with good range of motion.  Bilateral CMC subluxation, PIP and DIP thickening was noted.  No synovitis was noted.  Hip joints were in good range of motion.  She had tenderness over left trochanteric bursa.  Knee joints in good range of motion.  Left knee joint was replaced.  She had bilateral PIP and DIP thickening in her feet and tenderness over plantar fascia.  CDAI Exam: CDAI Score: -- Patient Global: --; Provider Global: -- Swollen: --; Tender: -- Joint Exam 08/15/2023   No joint exam has been documented for this visit   There is currently no information documented on the homunculus. Go to the Rheumatology activity and complete the homunculus joint exam.  Investigation: No additional findings.  Imaging: ECHOCARDIOGRAM COMPLETE Result Date: 07/24/2023    ECHOCARDIOGRAM REPORT   Patient Name:   TYA HAUGHEY  Date of Exam: 07/24/2023 Medical Rec #:  [id number]     Height:       65.0 in Accession #:    [id number]    Weight:       130.0 lb Date of Birth:  [birth year]     BSA:          1.647 m Patient Age:    62 years      BP:           133/80 mmHg Patient Gender: F             HR:           57 bpm. Exam Location:  [address] Procedure: 2D Echo, 3D Echo, Cardiac Doppler, Color Doppler and Strain Analysis Indications:    786.05 SOB  History:        Patient has prior history of Echocardiogram examinations, most                 recent 04/25/2021. Risk Factors:Sleep Apnea, Hypertension and                 HLD.  Sonographer:    Waldo Guadalajara RCS Referring Phys: [phone] THOMAS A KELLY IMPRESSIONS  1. Left ventricular ejection  fraction, by estimation, is 60 to 65%. The left ventricle has normal function. The left ventricle has no regional wall motion abnormalities. Left ventricular diastolic parameters were normal. The average left ventricular global longitudinal strain is -22.3 %. The global longitudinal strain is normal.  2. Right ventricular systolic function is normal. The  right ventricular size is normal. There is normal pulmonary artery systolic pressure.  3. The mitral valve is normal in structure. Mild mitral valve regurgitation. No evidence of mitral stenosis.  4. The aortic valve is normal in structure. Aortic valve regurgitation is not visualized. No aortic stenosis is present.  5. The inferior vena cava is normal in size with greater than 50% respiratory variability, suggesting right atrial pressure of 3 mmHg. FINDINGS  Left Ventricle: Left ventricular ejection fraction, by estimation, is 60 to 65%. The left ventricle has normal function. The left ventricle has no regional wall motion abnormalities. The average left ventricular global longitudinal strain is -22.3 %. The global longitudinal strain is normal. The left ventricular internal cavity size was normal in size. There is no left ventricular hypertrophy. Left ventricular diastolic parameters were normal. Right Ventricle: The right ventricular size is normal. No increase in right ventricular wall thickness. Right ventricular systolic function is normal. There is normal pulmonary artery systolic pressure. The tricuspid regurgitant velocity is 1.71 m/s, and  with an assumed right atrial pressure of 3 mmHg, the estimated right ventricular systolic pressure is 14.7 mmHg. Left Atrium: Left atrial size was normal in size. Right Atrium: Right atrial size was normal in size. Pericardium: There is no evidence of pericardial effusion. Presence of epicardial fat layer. Mitral Valve: The mitral valve is normal in structure. Mild mitral valve regurgitation. No evidence of mitral valve stenosis. Tricuspid Valve: The tricuspid valve is normal in structure. Tricuspid valve regurgitation is mild . No evidence of tricuspid stenosis. Aortic Valve: The aortic valve is normal in structure. Aortic valve regurgitation is not visualized. No aortic stenosis is present. Pulmonic Valve: The pulmonic valve was normal in structure. Pulmonic valve  regurgitation is not visualized. No evidence of pulmonic stenosis. Aorta: The aortic root is normal in size and structure. Venous: The inferior vena cava is normal in size with greater than 50% respiratory variability, suggesting right atrial pressure of 3 mmHg. IAS/Shunts: No atrial level shunt detected by color flow Doppler.  LEFT VENTRICLE PLAX 2D LVIDd:         4.00 cm   Diastology LVIDs:         2.40 cm   LV e' medial:    11.60 cm/s LV PW:         1.00 cm   LV E/e' medial:  7.6 LV IVS:        0.90 cm   LV e' lateral:   10.60 cm/s LVOT diam:     1.70 cm   LV E/e' lateral: 8.4 LV SV:         52 LV SV Index:   31        2D Longitudinal Strain LVOT Area:     2.27 cm  2D Strain GLS (A2C):   -25.7 %                          2D Strain GLS (A3C):   -19.4 %                          2D Strain GLS (  A4C):   -21.9 %                          2D Strain GLS Avg:     -22.3 %                           3D Volume EF:                          3D EF:        72 %                          LV EDV:       51 ml                          LV ESV:       14 ml                          LV SV:        37 ml RIGHT VENTRICLE RV Basal diam:  2.90 cm RV S prime:     11.70 cm/s TAPSE (M-mode): 2.0 cm RVSP:           14.7 mmHg LEFT ATRIUM             Index        RIGHT ATRIUM           Index LA diam:        3.20 cm 1.94 cm/m   RA Pressure: 3.00 mmHg LA Vol (A2C):   24.6 ml 14.93 ml/m  RA Area:     11.80 cm LA Vol (A4C):   54.8 ml 33.27 ml/m  RA Volume:   26.30 ml  15.97 ml/m LA Biplane Vol: 39.2 ml 23.80 ml/m  AORTIC VALVE LVOT Vmax:   102.00 cm/s LVOT Vmean:  65.000 cm/s LVOT VTI:    0.228 m  AORTA Ao Root diam: 2.40 cm Ao Asc diam:  3.10 cm MITRAL VALVE               TRICUSPID VALVE MV Area (PHT):             TR Peak grad:   11.7 mmHg MV Decel Time:             TR Vmax:        171.00 cm/s MV E velocity: 88.60 cm/s  Estimated RAP:  3.00 mmHg MV A velocity: 77.00 cm/s  RVSP:           14.7 mmHg MV E/A ratio:  1.15                             SHUNTS                            Systemic VTI:  0.23 m                            Systemic Diam: 1.70 cm Dub Tobb DO Electronically signed by Dub Huntsman DO Signature Date/Time: 07/24/2023/12:09:01 PM    Final    DG Lumbar Spine 2-3 Views Result Date: 07/20/2023 CLINICAL DATA:  Left hip and back pain with numbness in left knee for 6 months. EXAM: LUMBAR SPINE - 2-3 VIEW COMPARISON:  CT lumbar spine 05/25/2014 FINDINGS: Long segment thoracolumbosacral fusion changes are again seen. The hardware is intact without periprosthetic fracture or lucency. Abandoned screw fragment again seen in the L5 vertebral body. IMPRESSION: No acute abnormality of the lumbar spine. Postsurgical changes of the thoracolumbosacral fusion again seen. No evidence of hardware complication. Electronically Signed   By: Aliene Lloyd M.D.   On: 07/20/2023 13:46    Recent Labs: Lab Results  Component Value Date   WBC 4.4 06/18/2023   HGB 12.8 06/18/2023   PLT 197 06/18/2023   NA 141 06/18/2023   K 4.3 06/18/2023   CL 103 06/18/2023   CO2 32 06/18/2023   GLUCOSE 78 06/18/2023   BUN 17 06/18/2023   CREATININE 0.80 06/18/2023   BILITOT 0.6 06/18/2023   ALKPHOS 65 03/07/2020   AST 17 06/18/2023   ALT 14 06/18/2023   PROT 6.4 06/18/2023   ALBUMIN 4.5 03/07/2020   CALCIUM  9.2 06/18/2023   GFRAA 91 08/04/2020   QFTBGOLDPLUS NEGATIVE 03/21/2021    Speciality Comments: No specialty comments available.  Procedures:  No procedures performed Allergies: Patient has no known allergies.   Assessment / Plan:     Visit Diagnoses: Primary osteoarthritis of both hands - CMC subluxation, bilateral PIP and DIP thickening.  The patient states she has been on meloxicam  15 mg p.o. daily as needed.  She was experiencing increased pain when she came off meloxicam .  Labs from December 2024 were reviewed which are within normal limits.  Trochanteric bursitis of both hips-she has intermittent pain in the trochanteric bursa.  A handout  on IT band stretches was given.  History of arthroplasty of left knee - By Dr. Hiram.  She continues to have chronic pain.  Plantar fasciitis, bilateral-she has been having discomfort in bilateral feet to over the plantar fascia.  A handout on plantar fascia exercises were given.  Proper fitting shoes were advised.  DDD (degenerative disc disease), cervical-she continues to have a stiffness and decreased range of motion.  DDD (degenerative disc disease), thoracic -she has limited range of motion of the thoracic spine due to previous fusion.  She is followed by Dr. Marquette and Dr. Harvey.  She is on Robaxin -750 milligrams p.o. nightly.  Lumbar spondylosis - Status post fusion.  She has limited range of motion.  She complains of left-sided sciatica symptoms.  She has an appointment coming up with Dr. Garry.  Sicca syndrome (HCC)-she currently history of dry mouth and dry eye symptoms.  Over-the-counter products were discussed.  Other medical problems listed as follows:  Ehlers-Danlos syndrome type III  Osteopenia of multiple sites-DEXA scan is followed by Dr. Shayne.  Patient is on vitamin D .  She stopped calcium .  Vitamin D  deficiency-she takes vitamin D  supplement..  Primary insomnia-she is unable to sleep without Ambien .  She is on Ambien  5 mg p.o. nightly as needed.  Other fatigue-related to insomnia.  History of sleep apnea-she uses CPAP at night.  Patient states she has restless sleep at night.  Orders: No orders of the defined types were placed in this encounter.  No orders of the defined types were placed in this encounter.    Follow-Up Instructions: Return for Osteoarthritis.   Maya Nash, MD  Note - This record has been created using Animal nutritionist.  Chart creation errors have been sought, but may not always  have been located.  Such creation errors do not reflect on  the standard of medical care.

## 2023-08-02 NOTE — Progress Notes (Signed)
Patient returns for third shockwave treatment to bilateral trapezius muscles. Feels this is helping her. Having some pain lower back too. Hips have improved with home exercises.  Procedure: ECSWT Indications:  bilateral trapezius spasms.   Procedure Details Consent: Risks of procedure as well as the alternatives and risks of each were explained to the patient.  Written consent for procedure obtained. Time Out: Verified patient identification, verified procedure, site was marked, verified correct patient position, medications/allergies/relevent history reviewed.  The area was cleaned with alcohol swab.     Bilateral trapezius muscles were targeted for Extracorporeal shockwave therapy.    Preset: status post muscle injury Power Level: 110 Frequency: 13 Impulse/cycles: 2500 left, 2500 right Head size: large   Patient tolerated procedure well without immediate complications.  Follow up in 1 week for fourth treatment.

## 2023-08-08 ENCOUNTER — Ambulatory Visit (INDEPENDENT_AMBULATORY_CARE_PROVIDER_SITE_OTHER): Payer: Self-pay | Admitting: Family Medicine

## 2023-08-08 DIAGNOSIS — S46811S Strain of other muscles, fascia and tendons at shoulder and upper arm level, right arm, sequela: Secondary | ICD-10-CM

## 2023-08-08 DIAGNOSIS — S46812D Strain of other muscles, fascia and tendons at shoulder and upper arm level, left arm, subsequent encounter: Secondary | ICD-10-CM

## 2023-08-08 NOTE — Progress Notes (Signed)
Patient returns for fourth shockwave treatment to bilateral trapezius muscles. Still noting improvement.  Procedure: ECSWT Indications:  bilateral trapezius spasms   Procedure Details Consent: Risks of procedure as well as the alternatives and risks of each were explained to the patient.  Written consent for procedure obtained. Time Out: Verified patient identification, verified procedure, site was marked, verified correct patient position, medications/allergies/relevent history reviewed.  The area was cleaned with alcohol swab.     Bilateral trapezius muscles were targeted for Extracorporeal shockwave therapy.    Preset: status post muscle injury Power Level: 110 Frequency: 14 Impulse/cycles: 2500 left, 2500 right Head size: large   Patient tolerated procedure well without immediate complications. She will see how she feels over next week with consideration of additional treatments.

## 2023-08-12 ENCOUNTER — Other Ambulatory Visit: Payer: Self-pay | Admitting: Rheumatology

## 2023-08-13 ENCOUNTER — Other Ambulatory Visit: Payer: Self-pay | Admitting: Sports Medicine

## 2023-08-13 MED ORDER — ZOLPIDEM TARTRATE 5 MG PO TABS: 5.0000 mg | ORAL_TABLET | Freq: Every evening | ORAL | 0 refills | Status: AC | PRN

## 2023-08-13 NOTE — Telephone Encounter (Signed)
I noted that patient was given a prescription for diazepam total 30-day supply with 1 refill.  She cannot take Ambien with diazepam.

## 2023-08-13 NOTE — Telephone Encounter (Signed)
Last Fill: 07/17/2023   Next Visit: 08/15/2023   Last Visit: 02/12/2023   Dx: Primary insomnia    Current Dose per office note on 02/12/2023: Ambien 5 mg 1 tablet at bedtime for insomnia.    Okay to refill Ambien?

## 2023-08-13 NOTE — Telephone Encounter (Signed)
 Patient advised Dr. Dolphus noted that patient was given a prescription for diazepam  total 30-day supply with 1 refill. Patient advised she cannot take Ambien  with diazepam . Patient states she uses it very seldom. Patient states she used a 30 day supply in the span on 2 years.

## 2023-08-15 ENCOUNTER — Ambulatory Visit: Payer: 59 | Admitting: Sports Medicine

## 2023-08-15 ENCOUNTER — Ambulatory Visit: Payer: 59 | Attending: Rheumatology | Admitting: Rheumatology

## 2023-08-15 ENCOUNTER — Encounter: Payer: Self-pay | Admitting: Rheumatology

## 2023-08-15 VITALS — BP 120/76 | HR 74 | Resp 14 | Ht 65.0 in | Wt 133.8 lb

## 2023-08-15 DIAGNOSIS — M7061 Trochanteric bursitis, right hip: Secondary | ICD-10-CM | POA: Diagnosis not present

## 2023-08-15 DIAGNOSIS — M5030 Other cervical disc degeneration, unspecified cervical region: Secondary | ICD-10-CM

## 2023-08-15 DIAGNOSIS — Q7962 Hypermobile Ehlers-Danlos syndrome: Secondary | ICD-10-CM

## 2023-08-15 DIAGNOSIS — E559 Vitamin D deficiency, unspecified: Secondary | ICD-10-CM

## 2023-08-15 DIAGNOSIS — M3500 Sicca syndrome, unspecified: Secondary | ICD-10-CM

## 2023-08-15 DIAGNOSIS — M47816 Spondylosis without myelopathy or radiculopathy, lumbar region: Secondary | ICD-10-CM

## 2023-08-15 DIAGNOSIS — M8589 Other specified disorders of bone density and structure, multiple sites: Secondary | ICD-10-CM

## 2023-08-15 DIAGNOSIS — M722 Plantar fascial fibromatosis: Secondary | ICD-10-CM | POA: Diagnosis not present

## 2023-08-15 DIAGNOSIS — M5134 Other intervertebral disc degeneration, thoracic region: Secondary | ICD-10-CM

## 2023-08-15 DIAGNOSIS — M19042 Primary osteoarthritis, left hand: Secondary | ICD-10-CM

## 2023-08-15 DIAGNOSIS — Z96652 Presence of left artificial knee joint: Secondary | ICD-10-CM

## 2023-08-15 DIAGNOSIS — R5383 Other fatigue: Secondary | ICD-10-CM

## 2023-08-15 DIAGNOSIS — Z8669 Personal history of other diseases of the nervous system and sense organs: Secondary | ICD-10-CM

## 2023-08-15 DIAGNOSIS — F5101 Primary insomnia: Secondary | ICD-10-CM

## 2023-08-15 DIAGNOSIS — M19041 Primary osteoarthritis, right hand: Secondary | ICD-10-CM

## 2023-08-15 DIAGNOSIS — M7062 Trochanteric bursitis, left hip: Secondary | ICD-10-CM

## 2023-08-15 NOTE — Patient Instructions (Signed)
 Exercises for Plantar Fasciitis Foot and leg exercises can help if you have plantar fasciitis. Only do the exercises you were told to do. Make sure you know how to do the exercises safely. Follow the steps below. It's normal to feel mild discomfort. Stop if you feel pain or your pain gets worse. Do not start these exercises until told by your health care provider. Stretching and range-of-motion exercises These exercises warm up your muscles and joints. They also help with movement and flexibility of your foot. They can help with pain. Plantar fascia stretch This exercise will stretch your plantar fascia, which is a band of thick tissue on the bottom of your foot. Sit with your left / right leg crossed over your other knee. Hold your heel with one hand with that thumb near your arch. With your other hand, hold your toes. Gently pull your toes back toward the top of your foot. You should feel a stretch on the bottom of your toes, on the bottom of your foot, or both. Hold this stretch for __________ seconds. Slowly let go of your toes. Go back to the starting position. Repeat __________ times. Do this exercise __________ times a day. Gastroc stretch, standing This exercise is called an upper calf, or gastroc, stretch. It stretches the muscles in the back of your upper calf. Stand with your hands against a wall. Extend your left / right leg behind you. Bend your front knee just a little. Keep your heels on the floor, your toes facing forward, and your back knee straight. Shift your weight toward the wall. Do not arch your back. You should feel a gentle stretch in your upper calf. Hold this position for __________ seconds. Repeat __________ times. Do this exercise __________ times a day. Soleus stretch, standing This exercise is called a lower calf, or soleus, stretch. It stretches the muscles in the back of your lower calf. Stand with your hands against a wall. Extend your left / right leg behind  you, and bend your front knee slightly. Keep your heels on the floor and your toes facing forward. Bend your back knee and shift your weight slightly over your back leg. You should feel a gentle stretch deep in your lower calf. Hold this position for __________ seconds. Repeat __________ times. Do this exercise __________ times a day. Gastroc and soleus stretch, standing step This exercise stretches the muscles in the back of your lower leg. This includes your gastroc and soleus muscles. Stand with the ball of your left / right foot on the front of a step. The ball of your foot is on the walking surface, right under your toes. Keep your other foot firmly on the same step. Hold on to the wall or a railing for balance. Slowly lift your other foot, letting your body weight press your heel down over the edge of the front of the step. Keep your knee straight and unbent. You should feel a stretch in your calf. Hold this position for __________ seconds. Return both feet to the step. Repeat this exercise with a slight bend in your left / right knee. Repeat __________ times with your left / right knee straight and __________ times with your left / right knee bent. Do this exercise __________ times a day. Balance exercise This exercise builds your balance and strength control of your arch. It helps take pressure off your plantar fascia. Single leg stand If this exercise is too easy, you can try it with your eyes closed  or while standing on a pillow. Without shoes, stand near a railing or in a doorway. You may hold on to the railing or doorway as needed. Stand on your left / right foot. Keep your big toe down on the floor. Lift the arch of your foot. You should feel a stretch across the bottom of your foot and arch. Do not let your foot roll inward. Hold this position for __________ seconds. Repeat __________ times. Do this exercise __________ times a day. This information is not intended to replace  advice given to you by your health care provider. Make sure you discuss any questions you have with your health care provider. Document Revised: 11/26/2022 Document Reviewed: 11/26/2022 [employer] Patient Education  2024 [employer]  Iliotibial Band Syndrome Rehab Ask your health care provider which exercises are safe for you. Do exercises exactly as told by your provider and adjust them as told. It's normal to feel mild stretching, pulling, tightness, or discomfort as you do these exercises. Stop right away if you feel sudden pain or your pain gets a lot worse. Do not begin these exercises until told by your provider. Stretching and range-of-motion exercises These exercises warm up your muscles and joints. They also improve the movement and flexibility of your hip and pelvis. Quadriceps stretch, prone  Lie face down (prone) on a firm surface like a bed or padded floor. Bend your left / right knee. Reach back to hold your ankle or pant leg. If you can't reach your ankle or pant leg, use a belt looped around your foot and grab the belt instead. Gently pull your heel toward your butt. Your knee should not slide out to the side. You should feel a stretch in the front of your thigh and knee, also called the quadriceps. Hold this position for __________ seconds. Repeat __________ times. Complete this exercise __________ times a day. Iliotibial band stretch The iliotibial band is a strip of tissue that runs along the outside of your hip down to your knee. Lie on your side with your left / right leg on top. Bend both knees and grab your left / right ankle. Stretch out your bottom arm to help you balance. Slowly bring your top knee back so your thigh goes behind your back. Slowly lower your top leg toward the floor until you feel a gentle stretch on the outside of your left / right hip and thigh. If you don't feel a stretch and your knee won't go farther, place the heel of your other foot on top of your  knee and pull your knee down toward the floor with your foot. Hold this position for __________ seconds. Repeat __________ times. Complete this exercise __________ times a day. Strengthening exercises These exercises build strength and endurance in your hip and pelvis. Endurance means your muscles can keep working even when they're tired. Straight leg raises, side-lying This exercise strengthens the muscles that rotate the leg at the hip and move it away from your body. These muscles are called hip abductors. Lie on your side with your left / right leg on top. Lie so your head, shoulder, hip, and knee line up. You can bend your bottom knee to help you balance. Roll your hips slightly forward so they're stacked directly over each other. Your left / right knee should face forward. Tense the muscles in your outer thigh and hip. Lift your top leg 4-6 inches (10-15 cm) off the ground. Hold this position for __________ seconds. Slowly lower  your leg back down to the starting position. Let your muscles fully relax before doing this exercise again. Repeat __________ times. Complete this exercise __________ times a day. Leg raises, prone This exercise strengthens the muscles that move the hips backward. These muscles are called hip extensors. Lie face down (prone) on your bed or a firm surface. You can put a pillow under your hips for comfort and to support your lower back. Bend your left / right knee so your foot points straight up toward the ceiling. Keep the other leg straight and behind you. Squeeze your butt muscles. Lift your left / right thigh off the firm surface. Do not let your back arch. Tense your thigh muscle as hard as you can without having more knee pain. Hold this position for __________ seconds. Slowly lower your leg to the starting position. Allow your leg to relax all the way. Repeat __________ times. Complete this exercise __________ times a day. Hip hike  Stand sideways on a  bottom step. Place your feet so that your left / right leg is on the step, and the other foot is hanging off the side. If you need support for balance, hold onto a railing or wall. Keep your knees straight and your abdomen square, meaning your hips are level. Then, lift your left / right hip up toward the ceiling. Slowly let your leg that's hanging off the step lower towards the floor. Your foot should get closer to the ground. Do not lean or bend your knees during this movement. Repeat __________ times. Complete this exercise __________ times a day. This information is not intended to replace advice given to you by your health care provider. Make sure you discuss any questions you have with your health care provider. Document Revised: 09/07/2022 Document Reviewed: 09/07/2022 Elsevier Patient Education  2024 [employer].

## 2023-08-16 ENCOUNTER — Other Ambulatory Visit: Payer: Self-pay | Admitting: Rheumatology

## 2023-08-19 ENCOUNTER — Encounter (HOSPITAL_COMMUNITY): Payer: Self-pay

## 2023-08-20 ENCOUNTER — Telehealth (HOSPITAL_COMMUNITY): Payer: Self-pay | Admitting: *Deleted

## 2023-08-20 ENCOUNTER — Ambulatory Visit: Payer: 59 | Admitting: Sports Medicine

## 2023-08-20 NOTE — Telephone Encounter (Signed)
Attempted to call patient regarding upcoming cardiac PET appointment. Left message on voicemail with name and callback number  Larey Brick RN Navigator Cardiac Imaging [facility] [phone] Office [phone] Cell  Reminder to avoid caffeine 12 hours prior to her cardiac PET scan.

## 2023-08-20 NOTE — Telephone Encounter (Signed)
Patient returning call about her upcoming cardiac imaging study; pt verbalizes understanding of appt date/time, parking situation and where to check in, pre-test NPO status, and verified current allergies; name and call back number provided for further questions should they arise  Nicole Brick RN Navigator Cardiac Imaging [facility] [phone] office [phone] cell  Patient aware to avoid caffeine 12 hours prior to her cardiac PET scan.

## 2023-08-21 ENCOUNTER — Ambulatory Visit (HOSPITAL_COMMUNITY): Admission: RE | Admit: 2023-08-21 | Payer: 59 | Source: Ambulatory Visit

## 2023-09-26 LAB — LIPID PANEL
Chol/HDL Ratio: 1.6 ratio (ref 0.0–4.4)
Cholesterol, Total: 164 mg/dL (ref 100–199)
HDL: 102 mg/dL (ref >39)
LDL Chol Calc (NIH): 53 mg/dL (ref 0–99)
Triglycerides: 41 mg/dL (ref 0–149)
VLDL Cholesterol Cal: 9 mg/dL (ref 5–40)

## 2023-09-26 LAB — COMPREHENSIVE METABOLIC PANEL
ALT: 14 IU/L (ref 0–32)
AST: 16 IU/L (ref 0–40)
Albumin: 4.6 g/dL (ref 3.9–4.9)
Alkaline Phosphatase: 66 IU/L (ref 44–121)
BUN/Creatinine Ratio: 17 (ref 12–28)
BUN: 15 mg/dL (ref 8–27)
Bilirubin Total: 0.5 mg/dL (ref 0.0–1.2)
CO2: 26 mmol/L (ref 20–29)
Calcium: 9.2 mg/dL (ref 8.7–10.3)
Chloride: 104 mmol/L (ref 96–106)
Creatinine, Ser: 0.89 mg/dL (ref 0.57–1.00)
Globulin, Total: 1.7 g/dL (ref 1.5–4.5)
Glucose: 85 mg/dL (ref 70–99)
Potassium: 4.2 mmol/L (ref 3.5–5.2)
Sodium: 144 mmol/L (ref 134–144)
Total Protein: 6.3 g/dL (ref 6.0–8.5)
eGFR: 72 mL/min/{1.73_m2} (ref >59)

## 2023-09-26 LAB — LIPOPROTEIN A (LPA)

## 2023-10-21 ENCOUNTER — Other Ambulatory Visit: Payer: Self-pay | Admitting: *Deleted

## 2023-10-21 MED ORDER — METHOCARBAMOL 750 MG PO TABS: ORAL_TABLET | ORAL | 2 refills | Status: DC

## 2023-10-21 NOTE — Addendum Note (Signed)
 Addended by: Adrianne Horn on: 10/21/2023 02:41 PM   Modules accepted: Orders

## 2023-10-21 NOTE — Telephone Encounter (Signed)
 Patient contacted the office requesting a refill on Methocarbamol.  Last Fill: 07/18/2023  Next Visit: 02/12/2024  Last Visit: 08/15/2023  Dx: DDD (degenerative disc disease), thoracic   Current Dose per office note on 08/15/2023: Robaxin-750 milligrams p.o. nightly   Okay to refill Methocarbamol?

## 2023-10-24 ENCOUNTER — Other Ambulatory Visit: Payer: Self-pay | Admitting: Sports Medicine

## 2023-11-05 ENCOUNTER — Other Ambulatory Visit: Payer: Self-pay | Admitting: Sports Medicine

## 2023-11-05 ENCOUNTER — Other Ambulatory Visit: Payer: Self-pay

## 2023-11-05 DIAGNOSIS — M47816 Spondylosis without myelopathy or radiculopathy, lumbar region: Secondary | ICD-10-CM

## 2023-11-05 NOTE — Progress Notes (Signed)
 Pt called asking for repeat lumbar ESI for right hip pain. Order placed.

## 2023-11-06 ENCOUNTER — Other Ambulatory Visit (HOSPITAL_COMMUNITY): Payer: 59

## 2023-11-07 ENCOUNTER — Ambulatory Visit: Admitting: Sports Medicine

## 2023-11-08 NOTE — Discharge Instructions (Signed)

## 2023-11-11 ENCOUNTER — Other Ambulatory Visit: Payer: Self-pay | Admitting: Sports Medicine

## 2023-11-11 ENCOUNTER — Ambulatory Visit
Admission: RE | Admit: 2023-11-11 | Discharge: 2023-11-11 | Disposition: A | Discharge status: Home / Self Care | Source: Ambulatory Visit | Attending: Sports Medicine | Admitting: Sports Medicine

## 2023-11-11 DIAGNOSIS — M47816 Spondylosis without myelopathy or radiculopathy, lumbar region: Secondary | ICD-10-CM

## 2023-11-11 MED ORDER — IOPAMIDOL (ISOVUE-M 200) INJECTION 41%
1.0000 mL | Freq: Once | INTRAMUSCULAR | Status: AC
  Administered 2023-11-11: 1 mL via EPIDURAL

## 2023-11-11 MED ORDER — METHYLPREDNISOLONE ACETATE 40 MG/ML INJ SUSP (RADIOLOG
80.0000 mg | Freq: Once | INTRAMUSCULAR | Status: AC
  Administered 2023-11-11: 80 mg via EPIDURAL

## 2023-11-14 ENCOUNTER — Encounter: Payer: Self-pay | Admitting: Sports Medicine

## 2023-11-20 ENCOUNTER — Ambulatory Visit: Payer: 59 | Attending: Cardiovascular Disease | Admitting: Cardiovascular Disease

## 2023-11-20 ENCOUNTER — Encounter: Payer: Self-pay | Admitting: Cardiovascular Disease

## 2023-11-20 VITALS — BP 112/72 | HR 64 | Ht 65.0 in | Wt 134.4 lb

## 2023-11-20 DIAGNOSIS — G4733 Obstructive sleep apnea (adult) (pediatric): Secondary | ICD-10-CM | POA: Diagnosis present

## 2023-11-20 DIAGNOSIS — Q796 Ehlers-Danlos syndrome, unspecified: Secondary | ICD-10-CM

## 2023-11-20 DIAGNOSIS — R0609 Other forms of dyspnea: Secondary | ICD-10-CM

## 2023-11-20 DIAGNOSIS — E785 Hyperlipidemia, unspecified: Secondary | ICD-10-CM

## 2023-11-20 DIAGNOSIS — I10 Essential (primary) hypertension: Secondary | ICD-10-CM

## 2023-11-20 DIAGNOSIS — I2510 Atherosclerotic heart disease of native coronary artery without angina pectoris: Secondary | ICD-10-CM

## 2023-11-20 NOTE — Progress Notes (Signed)
 Cardiology Office Note    Date:  11/24/2023   ID:  ARMEDA PLUMB, DOB 10-06-1960, MRN [id number]  PCP:  Aldo Hun, MD  Cardiologist:  Magnus Schuller, MD   6 month F/U   History of Present Illness:  Nicole Oneill is a 63 y.o. female  who I had initially seen in 2007, and saw again in 2015.  I saw her on June 01, 2019  following her ER and overnight hospitalization for chest pain and last saw her on June 20, 2023.  She presents for 6 month follow-up evaluation.  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  I had seen her in 2007 when she was experiencing episodes of palpitations and vague chest pain.  Prior to that visit, she had undergone numerous orthopedic procedures with cervical as well as lumbar fusions in addition to knee replacement.  An echo Doppler study in November 2007 revealed normal LV size and function.  She had mild mitral annular calcification.  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  There was trivial tricuspid regurgitation.  Her aortic root dimension was normal and measured 2.3 cm in diameter.  A nuclear perfusion study showed normal perfusion in all regions.  A cardiac monitor showed occasional episodes of sinus tachycardia as well as episodes of PACs, and short bursts of atrial tachycardia without ventricular ectopy.   I saw her when she reestablished with me in 2015 and at that time she had at least 18 surgeries in the last 12 years and had recently undergone underwent complete fusion of her back at [facility].  Due to her hypermobility, osteoarthritis, intervertebral  disc disease and joint pains she ultimately was referred by Dr Clyda Dark to Dr. Aileen Householder at [facility].  She was diagnosed with Ehlers-Danlos syndrome, hypermobility type III .  She also was seen in the genetics  clinic evaluation.  Because of the association of aortic root dilatation.  She now presents for cardiologic reevaluation.   When I saw her in 2015, she denied any chest pain and was unaware of any palpitations, presyncope or syncope.  It was recommended by Dr. Aileen Householder that she take take vitamin D  and calcium  supplements, vitamin C 1000 mg daily , which aids in the cross-linking of collagen fibrils and prolongs life of normal collagen, Epsom salt baths when she has musculoskeletal pain as well as potential oral magnesium supplementation.  From a cardiac standpoint, Nicole Oneill had been doing pretty well but admits that she often gets short of breath with minimal activity but she is not been able to exercise significantly and this is not any change.  On November 9 she was leaning forward doing Pilates exercise when she began to develop substernal chest pain.  She stopped the exercise and drove home.  Her chest pain increased and radiated to her left arm.  She also noticed significant diaphoresis and developed nausea and vomiting.  Due to progression to severe chest tightness EMS was contacted and she was taken to the emergency room.  Due to her [place] Rhine- Danlos history she underwent CT imaging which did not disclose any abnormality of her aorta and there was no evidence for dissection.  High-sensitivity troponins were minimally increased in a flat plateau at 43.> 53>42> 34.   Her ECG did not reveal any ST abnormality but showed QS  complex anteroseptally.  She was seen by Dr. Nicholette Barley and subsequently underwent CTA imaging and was found to have a calcium  score of 38 which was 81st percentile for age and sex matched control.  There was minimal nonobstructive plaque of 0 to 24% at the ostium of her large dominant RCA.  Remaining vessels were without any plaque.  A 2D echo Doppler study done on May 18, 2019 showed hyperdynamic LV function with an EF estimated 65 to 70% without focal segmental wall motion abnormalities.   There was trace mitral regurgitation and trace tricuspid regurgitation.  Her aorta was normal dimension and there was no evidence for dilation or obstruction.  During her hospitalization lipid studies were notable for total cholesterol of 221, HDL 78, LDL 133 and triglycerides 52.  She was advised to take Crestor  5 mg 3 times per week.  Subsequent to her ER evaluation she was seen by Dr. Lavonia Powers Perrini who advised cardiology follow-up and raised concern for possible Takotsubo cardiomyopathy.    I saw her for reestablishment of cardiology care on June 01, 2019.  At that time she denied any recurrent chest pain symptomatology.  She had been on hormone replacement therapy for over 10 years but stopped therapy approximately 4 months ago.  She continued to experience mild shortness of breath with walking.  At that time she also stated that she had seen Dr. Trudy Fusi for obstructive sleep apnea and was previously diagnosed of having mild OSA.  She has had issues with a comfortable mask.  During her evaluation, I thoroughly reviewed her image studies including her echo Doppler and CTA evaluations.  Her Doppler study showed hyperdynamic LV function without wall motion abnormalities.  There was no evidence for apical ballooning or apical hypocontractility which I felt argued against Takotsubo cardiomyopathy.  Her coronary CTA revealed mild coronary calcification which I reviewed with her in detail.  With her LDL of 133 I recommended aggressive lipid-lowering therapy in order to achieve a target less than 70 in an attempt to hopefully induce plaque stability and regression.  I recommended she titrate her Crestor  from 5 mg 3 times a week to every other day and if tolerates to daily.  Since her blood pressure was elevated I added low-dose amlodipine  2.5 mg daily both for optimal blood pressure as well as potential vasospasm benefit.  I also discussed new mask technology and suggested she discussed the consideration of  a ResMed AirFit N 30i mask with Dr. Karlene Overcast.  I saw her September 10, 2019 and since her prior evaluation she had felt well.  She specifically denied any chest pain PND orthopnea and was unaware of palpitations.  .  She apparently stopped taking simvastatin and started atorvastatin  but has been taking this 10 mg daily.  She underwent repeat lipid studies on August 30, 2018 which shows marked improvement with total cholesterol now 128.  Her LDL cholesterol has improved from 133 down to 48.  I saw her on September 10, 2019.  Over the prior 6 months she had remained fairly stable.  She was doing Pilates approximately 4-5 times per week.    She has had some issues with her right knee and she is status post prior left knee replacement.  There was some concern for possible sciatica down her right leg.  She sees Dr. Drucella Georgi.  She continues to be on amlodipine  2.5 mg daily and atorvastatin  10 mg.  She takes gabapentin  for nerve pain.  During that evaluation her blood pressure was  stable.  She was tolerating atorvastatin .  I discussed sleep apnea and potential adverse cardiovascular consequences.  After not having seen her in 33 months, I saw her on December for, 2023.  She had undergone a follow-up sleep evaluation on June 05, 2021 by Dr. Matilde Son which confirmed moderate overall sleep apnea with an AHI of 26.6/h.  In the supine position, sleep apnea was severe with AHI of 65.6.  She had significant oxygen desaturation to 77%.  She previously had seen Dr. Karlene Overcast who had retired.  Presently she does admit to shortness of breath when walking up steps.  She had undergone PFTs.  She does Pilates at least 2 times per week.  She has issues with the extensive fusion of her back of C5-S1 and has some hip problems which she also sees Dr. Nolene Baumgarten.  She continues to be on low-dose amlodipine  at 2.5 mg for blood pressure control.  A calcium  score May 18, 2019 was 70 agaston and units representing 81 percentile for age and sex  matched control.  She is on atorvastatin  at 10 mg.  LDL cholesterol in October 2022 was 82 and she is followed by Dr. Alcide Aly.  She continues to be active and was doing Pilates at least 2 times per week without chest pain development.  Aggressive lipid management was recommended.  I last saw her on June 19, 2023. In September 2024  she experienced a sensation of a curtain over her left eye, her pupil became dilated.  She ultimately went to bed and by morning this had normalized.  1 week later she had undergone eyelid surgery.  Carotid Dopplers were stable.  Due to joint discomfort, she had reduced her atorvastatin  dose.  Recently, she has noticed that her heart rate increases when she walks up a hill and she has noticed more shortness of breath and at times notes a sensation of a chest pressure.  She is on amlodipine  2.5 mg daily.  She is on reduced dose Lipitor at 10 mg.  She takes albuterol  as needed for ADD.  She recently underwent laboratory in December 2024 which showed total cholesterol 171, triglycerides 44, HDL 97, and LDL 65.  CBC was 12.8 hematocrit 39.7.  Liver transaminases were normal.  Creatinine was stable at 0.80.  She presents for cardiology evaluation.  She sees Dr. Matilde Son for sleep apnea.  An Epworth scale calculated at 3 today.  During that evaluation I recommended a follow-up echo Doppler study and also nuclear PET stress imaging for assessment of coronary perfusion and potential ischemia.  Fasting laboratory including LP(a) was recommended.  A 2D echo Doppler study on July 24, 2023 showed normal LV systolic and diastolic function with EF 60 to 65%.  Global strain was normal.  There is mild MR.  Due to insurance issues, she had to postpone her cardiac PET stress test and this will be scheduled in several months.  Presently, she feels well and continues to be on atorvastatin  10 mg for hyperlipidemia.  She is on amlodipine  2.5 mg for hypertension.  She takes Neurontin  300 mg 1 capsule  in the morning, capsule at midday and 2 capsules at bedtime.  She is now on Medicare.  She presents for evaluation.  Past Medical History:  Diagnosis Date   Anemia    Arthritis    osteo   Atrial tachycardia (HCC)    x 10 years ago wias diagnosed with short runs of atrial tachycardia while wearing a heart monitor.  No problems since.  CAD (coronary artery disease)    noted on coronary CTA in 05/2019   Chronic dyspnea    Complication of anesthesia    urinary retention requiring indwelling  foley catheterization   DDD (degenerative disc disease)    Ehlers-Danlos syndrome type III    Fibromyalgia    History of blood transfusion    Hyperlipidemia    Hypertension    Osteoarthritis    Sleep apnea, obstructive    Thoracic outlet syndrome 07/2020   per patient     Past Surgical History:  Procedure Laterality Date   BACK SURGERY     Cervical fusion C5-7, fusion  T10-S1   DIAGNOSTIC LAPAROSCOPY     x 3   EYE SURGERY     bilateral tear duct fusion   EYE SURGERY Bilateral 03/19/2023   eyelid surgery and cyst removed from left tearduct   JOINT REPLACEMENT     partial left knee 2005, total left knee 2012   KNEE ARTHROPLASTY     KNEE ARTHROSCOPY  08/06/2012   Procedure: ARTHROSCOPY KNEE;  Surgeon: Aurther Blue, MD;  Location: [facility]S;  Service: Orthopedics;  Laterality: Left;  WITH SYNOVECTOMY and debridement   OSTEOTOMY     bilateral tibial    Current Medications: Outpatient Medications Prior to Visit  Medication Sig Dispense Refill   amLODipine  (NORVASC ) 2.5 MG tablet TAKE 1 TABLET(2.5 MG) BY MOUTH DAILY 90 tablet 3   amphetamine-dextroamphetamine (ADDERALL) 5 MG tablet TAKE 1 TABLET BY MOUTH EVERY DAY AS NEEDED FOR ATTENTION     Calcium  Carbonate-Vitamin D  (CALTRATE 600+D PO) Take 1 tablet by mouth daily.      Cholecalciferol  (VITAMIN D3) 2000 units capsule Take 2,000 Units by mouth daily.     dextroamphetamine (DEXTROSTAT) 5 MG tablet Take 5 mg by mouth as needed.      diazepam  (VALIUM ) 5 MG tablet Take 1 tablet (5 mg total) by mouth at bedtime. (Patient taking differently: Take 5 mg by mouth at bedtime as needed.) 30 tablet 1   doxycycline (VIBRA-TABS) 100 MG tablet Take 100 mg by mouth as needed.     estradiol (ESTRACE) 0.1 MG/GM vaginal cream Place 1 Applicatorful vaginally at bedtime.     Famotidine (PEPCID PO) Take by mouth as needed.     gabapentin  (NEURONTIN ) 300 MG capsule TAKE 1 CAPSULE BY MOUTH EVERY MORNING THEN 1 CAPSULE AT MIDDAY THEN 2 CAPSULES AT BEDTIME 360 capsule 1   meloxicam  (MOBIC ) 15 MG tablet Take 1 tablet (15 mg total) by mouth daily. (Patient taking differently: Take 15mg  daily for 4 days then stop for 3 days.) 30 tablet 5   methocarbamol  (ROBAXIN ) 750 MG tablet TAKE 1 TABLET(750 MG) BY MOUTH EVERY DAY AT BEDTIME AS NEEDED FOR MUSCLE SPASM 30 tablet 2   Polyethyl Glycol-Propyl Glycol 0.4-0.3 % SOLN Apply to eye.     Probiotic Product (PROBIOTIC PO) Take 1 capsule by mouth daily.     Simethicone (GAS-X PO) as needed.     zolpidem  (AMBIEN ) 5 MG tablet Take 1 tablet (5 mg total) by mouth at bedtime as needed for sleep. 30 tablet 0   atorvastatin  (LIPITOR) 10 MG tablet Take 1 tablet (10 mg total) by mouth daily. 90 tablet 3   fluorometholone (FML) 0.1 % ophthalmic suspension Apply to eye.     No facility-administered medications prior to visit.     Allergies:   Patient has no known allergies.   Social History   Socioeconomic History   Marital status: Married  Spouse name: Weston Hamming   Number of children: 3   Years of education: BA   Highest education level: Not on file  Occupational History    Employer: [employer]    Comment: [employer]   Tobacco Use   Smoking status: Never    Passive exposure: Never   Smokeless tobacco: Never  Vaping Use   Vaping status: Never Used  Substance and Sexual Activity   Alcohol  use: Yes    Alcohol /week: 3.0 standard drinks of alcohol     Types: 3 Glasses of wine per week    Comment:  occ   Drug use: No   Sexual activity: Not on file  Other Topics Concern   Not on file  Social History Narrative   Patient lives at home with family.   Caffeine Use: 4-5 sodas weekly   Social Drivers of Corporate investment banker Strain: Not on file  Food Insecurity: Not on file  Transportation Needs: Not on file  Physical Activity: Not on file  Stress: Not on file  Social Connections: Not on file     Family History:  The patient's family history includes Anorexia nervosa in her sister; Atrial fibrillation in her father; Cancer - Colon in her paternal grandfather and paternal grandmother; Cancer - Prostate in her father; Congestive Heart Failure in her father; Healthy in her daughter, daughter, and daughter; Heart attack in her maternal grandmother; Hypertension in her father; Stroke in her maternal grandfather and mother.  She believes her mother most likely had Erler's Danlos and sister also has Erler's Danlos syndrome.  ROS General: Negative; No fevers, chills, or night sweats; Ehlers-Danlos syndrome HEENT: Negative; No changes in vision or hearing, sinus congestion, difficulty swallowing Pulmonary: Negative; No cough, wheezing, shortness of breath, hemoptysis Cardiovascular: See HPI GI: Negative; No nausea, vomiting, diarrhea, or abdominal pain GU: Negative; No dysuria, hematuria, or difficulty voiding Musculoskeletal: Significant joint issues with multiple back surgeries and ultimate complete fusion of her back from T1-S1 done at [facility] Hematologic/Oncology: Negative; no easy bruising, bleeding Endocrine: Negative; no heat/cold intolerance; no diabetes Neuro: Negative; no changes in balance, headaches Skin: Negative; No rashes or skin lesions Psychiatric: Negative; No behavioral problems, depression Sleep: Positive for OSA and has been on CPAP therapy initially evaluated by Dr. Karlene Overcast. no daytime sleepiness, hypersomnolence, bruxism, restless legs, hypnogognic hallucinations, no  cataplexy Other comprehensive 14 point system review is negative.   PHYSICAL EXAM:   VS:  BP 112/72   Pulse 64   Ht 5\' 5"  (1.651 m)   Wt 134 lb 6.4 oz (61 kg)   SpO2 97%   BMI 22.37 kg/m     Repeat blood pressure by me was 126/70 supine and 125/72 standing  Wt Readings from Last 3 Encounters:  11/20/23 134 lb 6.4 oz (61 kg)  08/15/23 133 lb 12.8 oz (60.7 kg)  07/16/23 130 lb (59 kg)    General: Alert, oriented, no distress.  Skin: normal turgor, no rashes, warm and dry HEENT: Normocephalic, atraumatic. Pupils equal round and reactive to light; sclera anicteric; extraocular muscles intact;  Nose without nasal septal hypertrophy Mouth/Parynx benign; Mallinpatti scale 2 Neck: No JVD, no carotid bruits; normal carotid upstroke Lungs: clear to ausculatation and percussion; no wheezing or rales Chest wall: without tenderness to palpitation Heart: PMI not displaced, RRR, s1 s2 normal, 1/6 systolic murmur, no diastolic murmur, no rubs, gallops, thrills, or heaves Abdomen: soft, nontender; no hepatosplenomehaly, BS+; abdominal aorta nontender and not dilated by palpation. Back: no CVA  tenderness Pulses 2+ Musculoskeletal: full range of motion, normal strength, no joint deformities Extremities: no clubbing cyanosis or edema, Homan's sign negative  Neurologic: grossly nonfocal; Cranial nerves grossly wnl Psychologic: Normal mood and affect     Studies/Labs Reviewed:   EKG Interpretation Date/Time:  Wednesday Nov 20 2023 10:58:14 EDT Ventricular Rate:  64 PR Interval:  182 QRS Duration:  74 QT Interval:  400 QTC Calculation: 412 R Axis:   25  Text Interpretation: Normal sinus rhythm Normal ECG When compared with ECG of 19-Jun-2023 10:43, Criteria for Septal infarct are no longer Present Confirmed by Magnus Schuller ([id number]) on 11/24/2023 9:37:32 PM    June 11, 2022 ECG (independently read by me): NSR at 67, PRWP V1-3  March 09, 2020  ECG (independently read by me): NSR at  60; no ST changes, normal intervals    September 10, 2019  ECG (independently read by me): Normal sinus rhythm at 64 bpm.  QS complex V1 V2.  Nonspecific T wave abnormality in lead III.  Low voltage.  No ectopy.  June 01, 2019 ECG (independently read by me): Sinus bradycardia 58 bpm.  QS complex V1 V2 V3, not significantly changed from her ER tracing of May 18, 2019.  Normal intervals.  No ectopy  Recent Labs:    Latest Ref Rng & Units 09/25/2023    9:24 AM 06/18/2023   10:43 AM 01/31/2022   10:00 AM  BMP  Glucose 70 - 99 mg/dL 85  78  71   BUN 8 - 27 mg/dL 15  17  17    Creatinine 0.57 - 1.00 mg/dL 6.04  5.40  9.81   BUN/Creat Ratio 12 - 28 17  SEE NOTE:  NOT APPLICABLE   Sodium 134 - 144 mmol/L 144  141  145   Potassium 3.5 - 5.2 mmol/L 4.2  4.3  4.6   Chloride 96 - 106 mmol/L 104  103  107   CO2 20 - 29 mmol/L 26  32  32   Calcium  8.7 - 10.3 mg/dL 9.2  9.2  9.1         Latest Ref Rng & Units 09/25/2023    9:24 AM 06/18/2023   10:43 AM 01/31/2022   10:00 AM  Hepatic Function  Total Protein 6.0 - 8.5 g/dL 6.3  6.4  6.6   Albumin 3.9 - 4.9 g/dL 4.6     AST 0 - 40 IU/L 16  17  18    ALT 0 - 32 IU/L 14  14  19    Alk Phosphatase 44 - 121 IU/L 66     Total Bilirubin 0.0 - 1.2 mg/dL 0.5  0.6  0.5        Latest Ref Rng & Units 06/18/2023   10:43 AM 01/31/2022   10:00 AM 08/03/2021    1:54 PM  CBC  WBC 3.8 - 10.8 Thousand/uL 4.4  4.6  5.4   Hemoglobin 11.7 - 15.5 g/dL 19.1  47.8  29.5   Hematocrit 35.0 - 45.0 % 39.7  38.1  35.2   Platelets 140 - 400 Thousand/uL 197  178  197    Lab Results  Component Value Date   MCV 88.8 06/18/2023   MCV 88.0 01/31/2022   MCV 86.5 08/03/2021   No results found for: "TSH" No results found for: "HGBA1C"   BNP No results found for: "BNP"  ProBNP    Component Value Date/Time   PROBNP 67 03/24/2021 1533     Lipid Panel  Component Value Date/Time   CHOL 164 09/25/2023 0924   TRIG 41 09/25/2023 0924   HDL 102 09/25/2023  0924   CHOLHDL 1.6 09/25/2023 0924   CHOLHDL 2.8 05/19/2019 0504   VLDL 10 05/19/2019 0504   LDLCALC 53 09/25/2023 0924   LABVLDL 9 09/25/2023 0924     RADIOLOGY: DG Epidural/Nerve Root Result Date: 11/11/2023 CLINICAL DATA:  Lumbosacral spondylosis without myelopathy. Lumbar radiculopathy. History of spinal fusion. Excellent response to the right L5 nerve root block on 12/05/2021. Recurrent right-sided low back and leg pain. EXAM: EPIDURAL/NERVE ROOT FLUOROSCOPY: Radiation Exposure Index (as provided by the fluoroscopic device): 3.10 mGy Kerma PROCEDURE: The procedure, risks, benefits, and alternatives were explained to the patient. Questions regarding the procedure were encouraged and answered. The patient understands and consents to the procedure. RIGHT L5 NERVE ROOT BLOCK AND TRANSFORAMINAL EPIDURAL: A posterior oblique approach was taken to the intervertebral foramen on the right at L5-S1 using a curved 5 inch 22 gauge spinal needle. Injection of Isovue -M 200 outlined the right L5 nerve root and showed good epidural spread. No vascular opacification is seen. 80 mg of Depo-Medrol  mixed with 1.5 mL of 1% lidocaine were instilled. The procedure was well-tolerated, and the patient was discharged thirty minutes following the injection in good condition. COMPLICATIONS: None IMPRESSION: Technically successful injection consisting of a right L5 nerve root block and transforaminal epidural. Electronically Signed   By: Aundra Lee M.D.   On: 11/11/2023 15:11      Additional studies/ records that were reviewed today include:   ECHO 05/18/2019 IMPRESSIONS    1. Left ventricular ejection fraction, by visual estimation, is 65 to 70%. The left ventricle has hyperdynamic function. There is no left ventricular hypertrophy.  2. Global right ventricle has normal systolic function.The right ventricular size is normal. No increase in right ventricular wall thickness.  3. Left atrial size was normal.  4. Right  atrial size was normal.  5. The mitral valve is normal in structure. Trace mitral valve regurgitation. No evidence of mitral stenosis.  6. The tricuspid valve is normal in structure. Tricuspid valve regurgitation is trivial.  7. The aortic valve is normal in structure. Aortic valve regurgitation is not visualized. No evidence of aortic valve sclerosis or stenosis.  8. The pulmonic valve was normal in structure. Pulmonic valve regurgitation is not visualized.  9. The inferior vena cava is normal in size with greater than 50% respiratory variability, suggesting right atrial pressure of 3 mmHg.    CORONARY CTA IMPRESSION: 05/19/2019 1. Coronary calcium  score of 38. This was 58 percentile for age and sex matched control.   2. Normal coronary origin with right dominance.   3. CAD-RADS 1. Minimal non-obstructive CAD (0-24%). Consider non-atherosclerotic causes of chest pain. Consider preventive therapy and risk factor modification.    ASSESSMENT:    1. Coronary artery calcification   2. Primary hypertension   3. Ehlers-Danlos syndrome   4. Exertional dyspnea   5. OSA (obstructive sleep apnea)   6. Hyperlipidemia with target LDL less than 70     PLAN:  Nicole Oneill is a very pleasant 63 year old female who has a history of Ehlers- Danlos type III and has had numerous orthopedic issues contributed by hypermobility as well as disc disease.  She developed an episode of severe substernal chest tightness associated with left arm radiation, diaphoresis, nausea and vomiting worrisome for potential ischemic etiology leading to a hospital evaluation in November 2020.  Overnight hospitalization revealed a minimally increased flat  high-sensitivity troponin curve more suggestive of possible demand ischemia rather than ACS.  However, it appears that her symptoms first occurred when she was leaning over doing a Pilates move and lifting raising some concern for possible transient vasomotor spasm  contributing to her symptomatology.  Her echo Doppler study revealed hyperdynamic LV function without wall motion abnormalities. There was no evidence for apical ballooning or apical hypocontractility arguing against Takotsubo cardiomyopathy.  CTA demonstrated mild coronary calcification involving the ostium of her RCA and her calcium  score was mildly increased at 38.  When I saw her in July 2020 I recommended aggressive lipid-lowering therapy.  I further titrated her rosuvastatin  and apparently after 2 weeks this was changed to atorvastatin  10 mg daily.  Subsequent laboratory demonstrated markedly improved  LDL cholesterol down to 48 compared to 133.  I have not seen her in over 2-1/2 years.  She had been followed by Dr. Karlene Overcast for sleep apnea and was reevaluated on June 05, 2021 by Dr. Matilde Son which confirmed moderate overall sleep apnea however this was severe with supine sleep and she had significant oxygen desaturation to a nadir of 77%.  When last seen by me in December 2024 she had noticed more exertional shortness of breath particularly walking uphill as well as a vague chest sensation.  She underwent 2D echo Doppler study in July 24, 2023 which continued to show normal LV systolic and diastolic function with EF 60 to 65%.  She had normal global strain pattern.  There was mild MR.  I had scheduled her for a nuclear study but apparently this has been delayed due to her insurance change.  She is now scheduled for a NM PET CT cardiac perfusion study tentatively scheduled for January 15, 2024.  Her blood pressure today is stable on current therapy.  There is no edema.  I discussed with her my plans for upcoming retirement.  I will transition her to the cardiology care of Dr. Carson Clara with plan initial evaluation in August 2025 or sooner as needed.    Medication Adjustments/Labs and Tests Ordered: Current medicines are reviewed at length with the patient today.  Concerns regarding medicines are  outlined above.  Medication changes, Labs and Tests ordered today are listed in the Patient Instructions below. Patient Instructions  Medication Instructions:  NO CHANGES *If you need a refill on your cardiac medications before your next appointment, please call your pharmacy*  Lab Work: NO LABS If you have labs (blood work) drawn today and your tests are completely normal, you will receive your results only by: MyChart Message (if you have MyChart) OR A paper copy in the mail If you have any lab test that is abnormal or we need to change your treatment, we will call you to review the results.  Testing/Procedures: NO TESTING  Follow-Up: At [facility], you and your health needs are our priority.  As part of our continuing mission to provide you with exceptional heart care, our providers are all part of one team.  This team includes your primary Cardiologist (physician) and Advanced Practice Providers or APPs (Physician Assistants and Nurse Practitioners) who all work together to provide you with the care you need, when you need it.  Your next appointment:   4 month(s)  Provider:   Carson Clara, MD   Signed, Magnus Schuller, MD  11/24/2023 9:48 PM    [facility] [address], [place], [place]  [address] Phone: [phone]

## 2023-11-20 NOTE — Patient Instructions (Signed)
 Medication Instructions:  NO CHANGES *If you need a refill on your cardiac medications before your next appointment, please call your pharmacy*  Lab Work: NO LABS If you have labs (blood work) drawn today and your tests are completely normal, you will receive your results only by: MyChart Message (if you have MyChart) OR A paper copy in the mail If you have any lab test that is abnormal or we need to change your treatment, we will call you to review the results.  Testing/Procedures: NO TESTING  Follow-Up: At [facility], you and your health needs are our priority.  As part of our continuing mission to provide you with exceptional heart care, our providers are all part of one team.  This team includes your primary Cardiologist (physician) and Advanced Practice Providers or APPs (Physician Assistants and Nurse Practitioners) who all work together to provide you with the care you need, when you need it.  Your next appointment:   4 month(s)  Provider:   Carson Clara, MD

## 2023-11-24 ENCOUNTER — Encounter: Payer: Self-pay | Admitting: Cardiovascular Disease

## 2023-12-26 ENCOUNTER — Ambulatory Visit: Admitting: Sports Medicine

## 2023-12-26 ENCOUNTER — Other Ambulatory Visit: Payer: Self-pay

## 2023-12-26 ENCOUNTER — Encounter: Payer: Self-pay | Admitting: Sports Medicine

## 2023-12-26 VITALS — BP 123/66 | Ht 65.0 in | Wt 134.0 lb

## 2023-12-26 DIAGNOSIS — M25572 Pain in left ankle and joints of left foot: Secondary | ICD-10-CM

## 2023-12-26 NOTE — Progress Notes (Incomplete)
 PCP: Aldo Hun, MD  Chief Complaint: Left foot pain Subjective:   HPI: Patient is a 62 y.o. female here for ***.  ***  Past Medical History:  Diagnosis Date   Anemia    Arthritis    osteo   Atrial tachycardia (HCC)    x 10 years ago wias diagnosed with short runs of atrial tachycardia while wearing a heart monitor.  No problems since.   CAD (coronary artery disease)    noted on coronary CTA in 05/2019   Chronic dyspnea    Complication of anesthesia    urinary retention requiring indwelling  foley catheterization   DDD (degenerative disc disease)    Ehlers-Danlos syndrome type III    Fibromyalgia    History of blood transfusion    Hyperlipidemia    Hypertension    Osteoarthritis    Sleep apnea, obstructive    Thoracic outlet syndrome 07/2020   per patient     Current Outpatient Medications on File Prior to Visit  Medication Sig Dispense Refill   amLODipine  (NORVASC ) 2.5 MG tablet TAKE 1 TABLET(2.5 MG) BY MOUTH DAILY 90 tablet 3   amphetamine-dextroamphetamine (ADDERALL) 5 MG tablet TAKE 1 TABLET BY MOUTH EVERY DAY AS NEEDED FOR ATTENTION     atorvastatin  (LIPITOR) 10 MG tablet Take 1 tablet (10 mg total) by mouth daily. 90 tablet 3   Calcium  Carbonate-Vitamin D  (CALTRATE 600+D PO) Take 1 tablet by mouth daily.      Cholecalciferol  (VITAMIN D3) 2000 units capsule Take 2,000 Units by mouth daily.     dextroamphetamine (DEXTROSTAT) 5 MG tablet Take 5 mg by mouth as needed.     diazepam  (VALIUM ) 5 MG tablet Take 1 tablet (5 mg total) by mouth at bedtime. (Patient taking differently: Take 5 mg by mouth at bedtime as needed.) 30 tablet 1   doxycycline (VIBRA-TABS) 100 MG tablet Take 100 mg by mouth as needed.     estradiol (ESTRACE) 0.1 MG/GM vaginal cream Place 1 Applicatorful vaginally at bedtime.     Famotidine (PEPCID PO) Take by mouth as needed.     gabapentin  (NEURONTIN ) 300 MG capsule TAKE 1 CAPSULE BY MOUTH EVERY MORNING THEN 1 CAPSULE AT MIDDAY THEN 2 CAPSULES AT  BEDTIME 360 capsule 1   meloxicam  (MOBIC ) 15 MG tablet Take 1 tablet (15 mg total) by mouth daily. (Patient taking differently: Take 15mg  daily for 4 days then stop for 3 days.) 30 tablet 5   methocarbamol  (ROBAXIN ) 750 MG tablet TAKE 1 TABLET(750 MG) BY MOUTH EVERY DAY AT BEDTIME AS NEEDED FOR MUSCLE SPASM 30 tablet 2   Polyethyl Glycol-Propyl Glycol 0.4-0.3 % SOLN Apply to eye.     Probiotic Product (PROBIOTIC PO) Take 1 capsule by mouth daily.     Simethicone (GAS-X PO) as needed.     zolpidem  (AMBIEN ) 5 MG tablet Take 1 tablet (5 mg total) by mouth at bedtime as needed for sleep. 30 tablet 0   No current facility-administered medications on file prior to visit.    Past Surgical History:  Procedure Laterality Date   BACK SURGERY     Cervical fusion C5-7, fusion  T10-S1   DIAGNOSTIC LAPAROSCOPY     x 3   EYE SURGERY     bilateral tear duct fusion   EYE SURGERY Bilateral 03/19/2023   eyelid surgery and cyst removed from left tearduct   JOINT REPLACEMENT     partial left knee 2005, total left knee 2012   KNEE ARTHROPLASTY  KNEE ARTHROSCOPY  08/06/2012   Procedure: ARTHROSCOPY KNEE;  Surgeon: Aurther Blue, MD;  Location: [facility];  Service: Orthopedics;  Laterality: Left;  WITH SYNOVECTOMY and debridement   OSTEOTOMY     bilateral tibial    No Known Allergies  BP 123/66 (BP Location: Left Arm, Patient Position: Sitting)   Ht 5' 5 (1.651 m)   Wt 134 lb (60.8 kg)   BMI 22.30 kg/m      03/24/2020    3:16 PM 04/06/2020    1:36 PM 06/21/2020   11:15 AM  [facility]  Frequency of aerobic exercise (# of days/week) 4 4 4   Average time in minutes 30 30 40  Frequency of strengthening activities (# of days/week) 2 2 2         No data to display              Objective:  Physical Exam:  Gen: NAD, comfortable in exam room  ***   Assessment & Plan:  1. ***   Jude Norton MD, PGY-4  Sports Medicine Fellow [facility]

## 2023-12-26 NOTE — Progress Notes (Incomplete)
 PCP: Aldo Hun, MD  Chief Complaint: left foot pain, left hip pain  Subjective:   HPI: Patient is a 63 y.o. female here for left foot pain and left hip.  Patient notes that she has been having some pain of the left midfoot dorsal side.  Patient states that the pain has been bothering her for a few months now.  Patient notes that there is 1 area particularly that bothers her and she does not wear any socks because of the pain over that area.  Patient states the pain feels like dull and aching rather than sharp and stabbing.  Patient also notes some lateral hip pain.  Patient notes that that pain is very tender to palpation is more sharp stabbing.  Patient denies any injury on the side  Past Medical History:  Diagnosis Date   Anemia    Arthritis    osteo   Atrial tachycardia (HCC)    x 10 years ago wias diagnosed with short runs of atrial tachycardia while wearing a heart monitor.  No problems since.   CAD (coronary artery disease)    noted on coronary CTA in 05/2019   Chronic dyspnea    Complication of anesthesia    urinary retention requiring indwelling  foley catheterization   DDD (degenerative disc disease)    Ehlers-Danlos syndrome type III    Fibromyalgia    History of blood transfusion    Hyperlipidemia    Hypertension    Osteoarthritis    Sleep apnea, obstructive    Thoracic outlet syndrome 07/2020   per patient     Current Outpatient Medications on File Prior to Visit  Medication Sig Dispense Refill   amLODipine  (NORVASC ) 2.5 MG tablet TAKE 1 TABLET(2.5 MG) BY MOUTH DAILY 90 tablet 3   amphetamine-dextroamphetamine (ADDERALL) 5 MG tablet TAKE 1 TABLET BY MOUTH EVERY DAY AS NEEDED FOR ATTENTION     atorvastatin  (LIPITOR) 10 MG tablet Take 1 tablet (10 mg total) by mouth daily. 90 tablet 3   Calcium  Carbonate-Vitamin D  (CALTRATE 600+D PO) Take 1 tablet by mouth daily.      Cholecalciferol  (VITAMIN D3) 2000 units capsule Take 2,000 Units by mouth daily.      dextroamphetamine (DEXTROSTAT) 5 MG tablet Take 5 mg by mouth as needed.     diazepam  (VALIUM ) 5 MG tablet Take 1 tablet (5 mg total) by mouth at bedtime. (Patient taking differently: Take 5 mg by mouth at bedtime as needed.) 30 tablet 1   doxycycline (VIBRA-TABS) 100 MG tablet Take 100 mg by mouth as needed.     estradiol (ESTRACE) 0.1 MG/GM vaginal cream Place 1 Applicatorful vaginally at bedtime.     Famotidine (PEPCID PO) Take by mouth as needed.     gabapentin  (NEURONTIN ) 300 MG capsule TAKE 1 CAPSULE BY MOUTH EVERY MORNING THEN 1 CAPSULE AT MIDDAY THEN 2 CAPSULES AT BEDTIME 360 capsule 1   meloxicam  (MOBIC ) 15 MG tablet Take 1 tablet (15 mg total) by mouth daily. (Patient taking differently: Take 15mg  daily for 4 days then stop for 3 days.) 30 tablet 5   methocarbamol  (ROBAXIN ) 750 MG tablet TAKE 1 TABLET(750 MG) BY MOUTH EVERY DAY AT BEDTIME AS NEEDED FOR MUSCLE SPASM 30 tablet 2   Polyethyl Glycol-Propyl Glycol 0.4-0.3 % SOLN Apply to eye.     Probiotic Product (PROBIOTIC PO) Take 1 capsule by mouth daily.     Simethicone (GAS-X PO) as needed.     zolpidem  (AMBIEN ) 5 MG tablet Take 1 tablet (  5 mg total) by mouth at bedtime as needed for sleep. 30 tablet 0   No current facility-administered medications on file prior to visit.    Past Surgical History:  Procedure Laterality Date   BACK SURGERY     Cervical fusion C5-7, fusion  T10-S1   DIAGNOSTIC LAPAROSCOPY     x 3   EYE SURGERY     bilateral tear duct fusion   EYE SURGERY Bilateral 03/19/2023   eyelid surgery and cyst removed from left tearduct   JOINT REPLACEMENT     partial left knee 2005, total left knee 2012   KNEE ARTHROPLASTY     KNEE ARTHROSCOPY  08/06/2012   Procedure: ARTHROSCOPY KNEE;  Surgeon: Aurther Blue, MD;  Location: [facility];  Service: Orthopedics;  Laterality: Left;  WITH SYNOVECTOMY and debridement   OSTEOTOMY     bilateral tibial    No Known Allergies  BP 123/66 (BP Location: Left Arm, Patient  Position: Sitting)   Ht 5' 5 (1.651 m)   Wt 134 lb (60.8 kg)   BMI 22.30 kg/m      03/24/2020    3:16 PM 04/06/2020    1:36 PM 06/21/2020   11:15 AM  [facility]  Frequency of aerobic exercise (# of days/week) 4 4 4   Average time in minutes 30 30 40  Frequency of strengthening activities (# of days/week) 2 2 2         No data to display              Objective:  Physical Exam:  Gen: NAD, comfortable in exam room  Inspection reveals no gross abnormalities of the left hip.  There is mild tenderness to palpation of the greater trochanteric area.  Range of motion is full with hip abduction though there is some weakness noted of the left hip abductor compared to the right.  There is pain noted against hip abduction with resistance.  Left foot: No swelling, there is noted bony abnormality at the tarsometatarsal area.  This area is noted to be tender to palpation.  No tenderness to palpation distally or proximally from this area.  Range of motion is full with plantar and dorsiflexion.    Ultrasound left foot: Ultrasound left foot shows some swelling in the long axis of the tarsal metatarsal joint with a hypoechoic effusion.  There is also noted hyperechoic changes consistent with degenerative arthritic change.  Impression: Degenerative changes of the tarsometatarsal joint with noted effusion. Assessment & Plan:  1. 1. Pain of joint of left ankle and foot (Primary) - Patient's left foot pain is likely related to some tarsal metatarsal degenerative changes.  Ultrasound does show some fluid accumulation in this area.  Discussed with patient that she would benefit from adding a scaphoid pad back into her orthotic as well as a foot strap.  Will also go ahead and do topical Voltaren at least twice a day. - Patient's left hip pain likely secondary to greater trochanteric pain syndrome.  Will go ahead and give patient exercises to do in the meantime patient may take  meloxicam  as needed. - US  LIMITED JOINT SPACE STRUCTURES LOW LEFT; Future    Jude Norton MD, PGY-4  Sports Medicine Fellow [facility]

## 2024-01-06 ENCOUNTER — Other Ambulatory Visit: Payer: Self-pay | Admitting: Sports Medicine

## 2024-01-14 ENCOUNTER — Other Ambulatory Visit: Payer: Self-pay | Admitting: Rheumatology

## 2024-01-14 ENCOUNTER — Encounter (HOSPITAL_COMMUNITY): Payer: Self-pay

## 2024-01-14 NOTE — Telephone Encounter (Signed)
 Last Fill: 10/21/2023  Next Visit: 02/12/2024  Last Visit: 08/15/2023  Dx: DDD (degenerative disc disease), thoracic   Current Dose per office note on 08/15/2023: Robaxin -750 milligrams p.o. nightly   Okay to refill Methocarbamol ?

## 2024-01-15 ENCOUNTER — Ambulatory Visit (HOSPITAL_COMMUNITY)
Admission: RE | Admit: 2024-01-15 | Discharge: 2024-01-15 | Disposition: A | Discharge status: Home / Self Care | Source: Ambulatory Visit | Attending: Cardiovascular Disease | Admitting: Cardiovascular Disease

## 2024-01-15 DIAGNOSIS — R0789 Other chest pain: Secondary | ICD-10-CM | POA: Insufficient documentation

## 2024-01-15 MED ORDER — RUBIDIUM RB82 GENERATOR (RUBYFILL)
16.1200 | PACK | Freq: Once | INTRAVENOUS | Status: AC
Start: 2024-01-15 — End: 2024-01-15
  Administered 2024-01-15: 16.12 via INTRAVENOUS

## 2024-01-15 MED ORDER — RUBIDIUM RB82 GENERATOR (RUBYFILL)
16.1200 | PACK | Freq: Once | INTRAVENOUS | Status: AC
  Administered 2024-01-15: 15.72 via INTRAVENOUS

## 2024-01-15 MED ORDER — REGADENOSON 0.4 MG/5ML IV SOLN
INTRAVENOUS | Status: AC
  Filled 2024-01-15: qty 5

## 2024-01-15 MED ORDER — REGADENOSON 0.4 MG/5ML IV SOLN
0.4000 mg | Freq: Once | INTRAVENOUS | Status: AC
  Administered 2024-01-15: 0.4 mg via INTRAVENOUS

## 2024-01-16 LAB — NM PET CT CARDIAC PERFUSION MULTI W/ABSOLUTE BLOODFLOW
MBFR: 3.35
Nuc Rest EF: 62 %
Nuc Stress EF: 73 %
Rest MBF: 0.84 ml/g/min
Rest Nuclear Isotope Dose: 16.1 mCi
Stress MBF: 2.81 ml/g/min
Stress Nuclear Isotope Dose: 15.7 mCi
TID: 0.98

## 2024-01-17 ENCOUNTER — Ambulatory Visit: Payer: Self-pay | Admitting: Cardiovascular Disease

## 2024-01-28 ENCOUNTER — Ambulatory Visit (INDEPENDENT_AMBULATORY_CARE_PROVIDER_SITE_OTHER): Admitting: Sports Medicine

## 2024-01-28 VITALS — BP 124/70 | Ht 65.0 in | Wt 130.0 lb

## 2024-01-28 DIAGNOSIS — Q7962 Hypermobile Ehlers-Danlos syndrome: Secondary | ICD-10-CM

## 2024-01-28 DIAGNOSIS — M170 Bilateral primary osteoarthritis of knee: Secondary | ICD-10-CM | POA: Diagnosis not present

## 2024-01-28 NOTE — Progress Notes (Signed)
 Chief complaint left knee feels unstable and some lateral left hip pain  On last visit patient was having significant foot pain Patient will padding with scaphoid pads to her orthotics seem to relieve most of the symptoms This has taken pressure off her first MTP joint  Left knee which has been replaced in the past still feels unstable at times She will periodically get some swelling She continues Pilates and exercise classes which helps somewhat She is fairly inconsistent with her hip abduction exercises  Physical exam Pleasant white female in no acute distress BP 124/70   Ht 5' 5 (1.651 m)   Wt 130 lb (59 kg)   BMI 21.63 kg/m   Left knee Midline scar noted Small effusion present to palpation There is some increased laxity on Lachman testing There is also some clicking and laxity on medial lateral stress She has full extension and 130 degrees of flexion  Left hip abduction is still significantly weak

## 2024-01-28 NOTE — Assessment & Plan Note (Signed)
 Patient has had a remote total left knee replacement plus before that time she had had a partial knee and before that she had had a wedge osteotomy of both knees which led to significant improvement on the right but not on the left  Currently she does have some increased laxity but good function and solid endpoints  I think she should continue with a more consistent home exercise program to strengthen her hip abductors and try to stabilize the excess knee motion The other classes that she is doing are good and she should keep those up Use her compression sleeve when she is doing much walking or standing  After discussion we decided to try to get her to more consistently do her hip abduction exercise series trying to get to this on a daily basis  We will recheck in 2 months and see if she is making progress

## 2024-01-28 NOTE — Assessment & Plan Note (Signed)
 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

## 2024-01-28 NOTE — Patient Instructions (Addendum)
 [facility] [phone] [place] Haldeman-Englert, MD Will fax referral to his office at [phone] Phone: [phone]

## 2024-01-29 NOTE — Progress Notes (Signed)
 Office Visit Note  Patient: Nicole Oneill             Date of Birth: [birth year]           MRN: [id number]             PCP: Shayne Anes, MD Referring: Shayne Anes, MD Visit Date: 02/12/2024 Occupation: @GUAROCC @  Subjective:  Pain in joints  History of Present Illness: Nicole Oneill is a 63 y.o. female with osteoarthritis, degenerative disc disease.  She returns today after her last visit in February 2025.  She states she has been having some discomfort in the left trochanteric region which affects her left knee.  She has been seeing Dr. Harvey who gave her  exercises which she is trying to do at home.  She also had some problems with left ulnar neuropathy which is improving gradually.  She continues to have some discomfort in her hands, knees, hips and her feet.  She takes meloxicam  15 mg p.o. 3 times a week as needed for pain.  She continues to take her vitamin D  supplement for osteopenia.  She continues to have insomnia and has been on Ambien  5 mg p.o. nightly which helps her to relax at night.    Activities of Daily Living:  Patient reports morning stiffness for all day. Patient Reports nocturnal pain.  Difficulty dressing/grooming: Denies Difficulty climbing stairs: Denies Difficulty getting out of chair: Denies Difficulty using hands for taps, buttons, cutlery, and/or writing: Reports  Review of Systems  Constitutional:  Negative for fatigue.  HENT:  Positive for mouth dryness. Negative for mouth sores.   Eyes:  Positive for dryness.  Respiratory:  Negative for difficulty breathing.   Cardiovascular:  Negative for chest pain and palpitations.  Gastrointestinal:  Positive for constipation. Negative for blood in stool and diarrhea.  Endocrine: Negative for increased urination.  Genitourinary:  Negative for involuntary urination.  Musculoskeletal:  Positive for joint pain, joint pain, myalgias, muscle weakness, morning stiffness, muscle tenderness and myalgias. Negative for gait  problem and joint swelling.  Skin:  Negative for color change, rash, hair loss and sensitivity to sunlight.  Allergic/Immunologic: Negative for susceptible to infections.  Neurological:  Positive for numbness. Negative for dizziness and headaches.  Hematological:  Negative for swollen glands.  Psychiatric/Behavioral:  Negative for depressed mood and sleep disturbance. The patient is not nervous/anxious.     PMFS History:  Patient Active Problem List   Diagnosis Date Noted   Strain of gluteus medius 07/16/2023   Trapezius strain, left, initial encounter 07/16/2023   Ulnar neuropathy at elbow of right upper extremity 07/10/2022   Ulnar neuropathy of left upper extremity 05/16/2022   Damage to left ulnar nerve 02/06/2022   Nonobstructive CAD 05/04/2021   Hypertension 05/04/2021   Hyperlipidemia 05/04/2021   Shortness of breath 04/04/2021   OSA on CPAP 04/04/2021   Right shoulder pain 02/19/2019   DDD (degenerative disc disease), cervical 04/28/2018   DDD (degenerative disc disease), lumbar 10/29/2017   Sicca syndrome (HCC) 10/29/2017   Left anterior shoulder pain 11/27/2016   History of lumbar fusion 10/16/2016   History of fusion of cervical spine 10/16/2016   History of total knee arthroplasty, left 2012 10/16/2016   Primary insomnia 10/16/2016   DDD lumbar 10/15/2016   DJD (degenerative joint disease), cervical 10/15/2016   DDD (degenerative disc disease), thoracic 10/15/2016   Primary osteoarthritis of both knees 10/15/2016   Tendinopathy of right gluteus medius 05/29/2016   Chronic lumbar radiculopathy 03/08/2016  Heart murmur, systolic 06/19/2014   Disorder of SI (sacroiliac) joint 06/02/2014   Functional gait abnormality 06/02/2014   Ehlers-Danlos syndrome type III 03/10/2014   Foot arch pain 03/10/2014   Left knee pain 05/26/2013   Primary osteoarthritis of both hands 05/26/2013   Effusion of knee joint, left 08/05/2012   Osteoarthritis of multiple joints 10/12/2010     Past Medical History:  Diagnosis Date   Anemia    Arthritis    osteo   Atrial tachycardia (HCC)    x 10 years ago wias diagnosed with short runs of atrial tachycardia while wearing a heart monitor.  No problems since.   CAD (coronary artery disease)    noted on coronary CTA in 05/2019   Chronic dyspnea    Complication of anesthesia    urinary retention requiring indwelling  foley catheterization   DDD (degenerative disc disease)    Ehlers-Danlos syndrome type III    Fibromyalgia    History of blood transfusion    Hyperlipidemia    Hypertension    Osteoarthritis    Sleep apnea, obstructive    Thoracic outlet syndrome 07/2020   per patient     Family History  Problem Relation Age of Onset   Stroke Mother        hemorrhagic   Hypertension Father    Cancer - Prostate Father    Atrial fibrillation Father    Congestive Heart Failure Father    Anorexia nervosa Sister    Heart attack Maternal Grandmother    Stroke Maternal Grandfather    Cancer - Colon Paternal Grandmother    Cancer - Colon Paternal Grandfather    Healthy Daughter    Healthy Daughter    Healthy Daughter    Past Surgical History:  Procedure Laterality Date   BACK SURGERY     Cervical fusion C5-7, fusion  T10-S1   DIAGNOSTIC LAPAROSCOPY     x 3   EYE SURGERY     bilateral tear duct fusion   EYE SURGERY Bilateral 03/19/2023   eyelid surgery and cyst removed from left tearduct   JOINT REPLACEMENT     partial left knee 2005, total left knee 2012   KNEE ARTHROPLASTY     KNEE ARTHROSCOPY  08/06/2012   Procedure: ARTHROSCOPY KNEE;  Surgeon: Dempsey LULLA Moan, MD;  Location: [facility];  Service: Orthopedics;  Laterality: Left;  WITH SYNOVECTOMY and debridement   OSTEOTOMY     bilateral tibial   Social History   Social History Narrative   Patient lives at home with family.   Caffeine Use: 4-5 sodas weekly   Immunization History  Administered Date(s) Administered   Influenza Inj Mdck Quad Pf 04/22/2017    Influenza,inj,quad, With Preservative 04/08/2017   Moderna Sars-Covid-2 Vaccination 10/11/2019, 11/06/2019, 10/08/2020   PFIZER(Purple Top)SARS-COV-2 Vaccination 06/09/2020   Pfizer Covid-19 Vaccine Bivalent Booster 27yrs & up 03/22/2021     Objective: Vital Signs: BP 111/73 (BP Location: Left Arm, Patient Position: Sitting, Cuff Size: Normal)   Pulse 69   Resp 16   Ht 5' 5 (1.651 m)   Wt 132 lb (59.9 kg)   BMI 21.97 kg/m    Physical Exam Vitals and nursing note reviewed.  Constitutional:      Appearance: She is well-developed.  HENT:     Head: Normocephalic and atraumatic.  Eyes:     Conjunctiva/sclera: Conjunctivae normal.  Cardiovascular:     Rate and Rhythm: Normal rate and regular rhythm.     Heart sounds: Normal  heart sounds.  Pulmonary:     Effort: Pulmonary effort is normal.     Breath sounds: Normal breath sounds.  Abdominal:     General: Bowel sounds are normal.     Palpations: Abdomen is soft.  Musculoskeletal:     Cervical back: Normal range of motion.  Lymphadenopathy:     Cervical: No cervical adenopathy.  Skin:    General: Skin is warm and dry.     Capillary Refill: Capillary refill takes less than 2 seconds.  Neurological:     Mental Status: She is alert and oriented to person, place, and time.  Psychiatric:        Behavior: Behavior normal.      Musculoskeletal Exam: She has some limitation with lateral rotation of the cervical spine.  She had limited range of motion of her thoracic and lumbar spine due to spinal fusion.  Shoulders, elbows, wrist joints were in good range of motion.  She had bilateral CMC subluxation and bilateral first MCP subluxation.  Bilateral PIP and DIP thickening was noted.  No synovitis was noted.  She had discomfort range of motion of the right hip joint.  She had tenderness over the left trochanteric bursa.  Left knee joint was replaced.  There was no tenderness over her ankles or MTPs.  There was no plantar fasciitis or  Achilles tendinitis.  CDAI Exam: CDAI Score: -- Patient Global: --; Provider Global: -- Swollen: --; Tender: -- Joint Exam 02/12/2024   No joint exam has been documented for this visit   There is currently no information documented on the homunculus. Go to the Rheumatology activity and complete the homunculus joint exam.  Investigation: No additional findings.  Imaging: NM PET CT CARDIAC PERFUSION MULTI W/ABSOLUTE BLOODFLOW Result Date: 01/16/2024   The study is normal. The study is low risk.   LV perfusion is normal. There is no evidence of ischemia. There is no evidence of infarction.   Rest left ventricular function is normal. Rest EF: 62%. Stress left ventricular function is normal. Stress EF: 73%. End diastolic cavity size is normal. End systolic cavity size is normal.   Myocardial blood flow was computed to be 0.48ml/g/min at rest and 2.81ml/g/min at stress. Global myocardial blood flow reserve was 3.35 and was normal.   Coronary calcium  was present on the attenuation correction CT images. Mild coronary calcifications were present. Coronary calcifications were present in the right coronary artery distribution(s).   Electronically signed by Lonni Nanas, MD CLINICAL DATA:  This over-read does not include interpretation of cardiac or coronary anatomy or pathology. The cardiac PET-CT interpretation by the cardiologist is attached. COMPARISON:  None Available. FINDINGS: No suspicious nodules, masses, or infiltrates are identified in the visualized portion of the lungs. No pleural fluid seen. The visualized portions of the mediastinum and chest wall are unremarkable. Thoracic posterior spinal fixation rods noted. IMPRESSION: No significant non-cardiac abnormality identified. Electronically Signed   By: Norleen DELENA Kil M.D.   On: 01/15/2024 13:19   Recent Labs: Lab Results  Component Value Date   WBC 4.4 06/18/2023   HGB 12.8 06/18/2023   PLT 197 06/18/2023   NA 144 09/25/2023   K 4.2  09/25/2023   CL 104 09/25/2023   CO2 26 09/25/2023   GLUCOSE 85 09/25/2023   BUN 15 09/25/2023   CREATININE 0.89 09/25/2023   BILITOT 0.5 09/25/2023   ALKPHOS 66 09/25/2023   AST 16 09/25/2023   ALT 14 09/25/2023   PROT 6.3 09/25/2023  ALBUMIN 4.6 09/25/2023   CALCIUM  9.2 09/25/2023   GFRAA 91 08/04/2020   QFTBGOLDPLUS NEGATIVE 03/21/2021    Speciality Comments: No specialty comments available.  Procedures:  No procedures performed Allergies: Patient has no known allergies.   Assessment / Plan:     Visit Diagnoses: Primary osteoarthritis of both hands -she continues to have pain and stiffness in her bilateral hands.  CMC and bilateral first MCP subluxation was noted, bilateral PIP and DIP thickening.she is on meloxicam  15 mg p.o. daily as needed.  She takes it about 3 times week.  Trochanteric bursitis of both hips-patient states she has been having increased left trochanteric bursitis pain and has been followed by Dr. Harvey.  She is doing some exercises at home.  Pain in right hip -she has been having increased discomfort in her right hip joint.  She states she has difficulty getting in and out of the car.  She has been driving a lot to visit her grandchildren.  She had good range of motion of her right hip joint.  Plan: XR HIP UNILAT W OR W/O PELVIS 2-3 VIEWS RIGHT.  X-rays with history of early osteoarthritis.  X-ray findings were reviewed with the patient.  Patient will discuss possible cortisone injection with Dr. Harvey.  A handout on hip exercises was given.  History of arthroplasty of left knee - Dr. Hiram.  Involved.  Plantar fasciitis, bilateral-no recent episodes.  DDD (degenerative disc disease), cervical-she continues to have some stiffness and discomfort in her cervical spine.  DDD (degenerative disc disease), thoracic - previous fusion.  She is followed by Dr. Marquette and Dr. Harvey.  Medication monitoring encounter -she is on long-term NSAIDs.  Plan: CBC with  Differential/Platelet, Comprehensive metabolic panel with GFR  Lumbar spondylosis - Status post fusion.  She has limited range of motion.   Sicca syndrome (HCC)-over-the-counter products were discussed.  Ehlers-Danlos syndrome type III-followed by Dr. Harvey.  Osteopenia of multiple sites - DEXA scan is followed by Dr. Shayne.  Patient is on vitamin D .  She stopped calcium .  Vitamin D  deficiency - Plan: VITAMIN D  25 Hydroxy (Vit-D Deficiency, Fractures)  Primary insomnia -she continues to have insomnia without Ambien .  She is on Ambien  5 mg p.o. nightly as needed.  Other fatigue-related to insomnia.  History of sleep apnea  Orders: Orders Placed This Encounter  Procedures   XR HIP UNILAT W OR W/O PELVIS 2-3 VIEWS RIGHT   CBC with Differential/Platelet   Comprehensive metabolic panel with GFR   VITAMIN D  25 Hydroxy (Vit-D Deficiency, Fractures)   No orders of the defined types were placed in this encounter.    Follow-Up Instructions: Return for Osteoarthritis.   Maya Nash, MD  Note - This record has been created using Animal nutritionist.  Chart creation errors have been sought, but may not always  have been located. Such creation errors do not reflect on  the standard of medical care.

## 2024-02-12 ENCOUNTER — Ambulatory Visit: Payer: 59 | Attending: Rheumatology | Admitting: Rheumatology

## 2024-02-12 ENCOUNTER — Ambulatory Visit (INDEPENDENT_AMBULATORY_CARE_PROVIDER_SITE_OTHER)

## 2024-02-12 ENCOUNTER — Encounter: Payer: Self-pay | Admitting: Rheumatology

## 2024-02-12 VITALS — BP 111/73 | HR 69 | Resp 16 | Ht 65.0 in | Wt 132.0 lb

## 2024-02-12 DIAGNOSIS — E559 Vitamin D deficiency, unspecified: Secondary | ICD-10-CM | POA: Diagnosis present

## 2024-02-12 DIAGNOSIS — R5383 Other fatigue: Secondary | ICD-10-CM | POA: Diagnosis present

## 2024-02-12 DIAGNOSIS — M7062 Trochanteric bursitis, left hip: Secondary | ICD-10-CM | POA: Diagnosis present

## 2024-02-12 DIAGNOSIS — M7061 Trochanteric bursitis, right hip: Secondary | ICD-10-CM | POA: Diagnosis present

## 2024-02-12 DIAGNOSIS — M19041 Primary osteoarthritis, right hand: Secondary | ICD-10-CM | POA: Diagnosis present

## 2024-02-12 DIAGNOSIS — F5101 Primary insomnia: Secondary | ICD-10-CM | POA: Insufficient documentation

## 2024-02-12 DIAGNOSIS — M47816 Spondylosis without myelopathy or radiculopathy, lumbar region: Secondary | ICD-10-CM | POA: Diagnosis present

## 2024-02-12 DIAGNOSIS — Z96652 Presence of left artificial knee joint: Secondary | ICD-10-CM | POA: Insufficient documentation

## 2024-02-12 DIAGNOSIS — M8589 Other specified disorders of bone density and structure, multiple sites: Secondary | ICD-10-CM | POA: Diagnosis present

## 2024-02-12 DIAGNOSIS — M3500 Sicca syndrome, unspecified: Secondary | ICD-10-CM | POA: Insufficient documentation

## 2024-02-12 DIAGNOSIS — M722 Plantar fascial fibromatosis: Secondary | ICD-10-CM | POA: Diagnosis present

## 2024-02-12 DIAGNOSIS — M19042 Primary osteoarthritis, left hand: Secondary | ICD-10-CM | POA: Diagnosis present

## 2024-02-12 DIAGNOSIS — Z5181 Encounter for therapeutic drug level monitoring: Secondary | ICD-10-CM | POA: Insufficient documentation

## 2024-02-12 DIAGNOSIS — Z8669 Personal history of other diseases of the nervous system and sense organs: Secondary | ICD-10-CM | POA: Insufficient documentation

## 2024-02-12 DIAGNOSIS — M25551 Pain in right hip: Secondary | ICD-10-CM | POA: Insufficient documentation

## 2024-02-12 DIAGNOSIS — M5134 Other intervertebral disc degeneration, thoracic region: Secondary | ICD-10-CM | POA: Insufficient documentation

## 2024-02-12 DIAGNOSIS — M5030 Other cervical disc degeneration, unspecified cervical region: Secondary | ICD-10-CM | POA: Insufficient documentation

## 2024-02-12 DIAGNOSIS — Q7962 Hypermobile Ehlers-Danlos syndrome: Secondary | ICD-10-CM | POA: Insufficient documentation

## 2024-02-12 LAB — COMPREHENSIVE METABOLIC PANEL WITH GFR
AG Ratio: 2.7 (calc) — ABNORMAL HIGH (ref 1.0–2.5)
ALT: 12 U/L (ref 6–29)
AST: 15 U/L (ref 10–35)
Albumin: 4.6 g/dL (ref 3.6–5.1)
Alkaline phosphatase (APISO): 56 U/L (ref 37–153)
BUN: 20 mg/dL (ref 7–25)
CO2: 32 mmol/L (ref 20–32)
Calcium: 9.1 mg/dL (ref 8.6–10.4)
Chloride: 104 mmol/L (ref 98–110)
Creat: 0.8 mg/dL (ref 0.50–1.05)
Globulin: 1.7 g/dL — ABNORMAL LOW (ref 1.9–3.7)
Glucose, Bld: 71 mg/dL (ref 65–99)
Potassium: 4.5 mmol/L (ref 3.5–5.3)
Sodium: 141 mmol/L (ref 135–146)
Total Bilirubin: 0.6 mg/dL (ref 0.2–1.2)
Total Protein: 6.3 g/dL (ref 6.1–8.1)
eGFR: 82 mL/min/1.73m2 (ref >=60)

## 2024-02-12 LAB — CBC WITH DIFFERENTIAL/PLATELET
Absolute Lymphocytes: 1181 {cells}/uL (ref 850–3900)
Absolute Monocytes: 374 {cells}/uL (ref 200–950)
Basophils Absolute: 29 {cells}/uL (ref 0–200)
Basophils Relative: 0.6 %
Eosinophils Absolute: 62 {cells}/uL (ref 15–500)
Eosinophils Relative: 1.3 %
HCT: 40 % (ref 35.0–45.0)
Hemoglobin: 12.8 g/dL (ref 11.7–15.5)
MCH: 29.1 pg (ref 27.0–33.0)
MCHC: 32 g/dL (ref 32.0–36.0)
MCV: 90.9 fL (ref 80.0–100.0)
MPV: 10.2 fL (ref 7.5–12.5)
Monocytes Relative: 7.8 %
Neutro Abs: 3154 {cells}/uL (ref 1500–7800)
Neutrophils Relative %: 65.7 %
Platelets: 205 Thousand/uL (ref 140–400)
RBC: 4.4 Million/uL (ref 3.80–5.10)
RDW: 13 % (ref 11.0–15.0)
Total Lymphocyte: 24.6 %
WBC: 4.8 Thousand/uL (ref 3.8–10.8)

## 2024-02-12 LAB — VITAMIN D 25 HYDROXY (VIT D DEFICIENCY, FRACTURES): Vit D, 25-Hydroxy: 41 ng/mL (ref 30–100)

## 2024-02-12 NOTE — Patient Instructions (Signed)
Hip Exercises Ask your health care provider which exercises are safe for you. Do exercises exactly as told by your provider and adjust them as told. It is normal to feel mild stretching, pulling, tightness, or discomfort as you do these exercises. Stop right away if you feel sudden pain or your pain gets worse. Do not begin these exercises until told by your provider. Stretching and range-of-motion exercises These exercises warm up your muscles and joints and improve the movement and flexibility of your hip. They also help to relieve pain, numbness, and tingling. You may be asked to limit your range of motion if you had a hip replacement. Talk to your provider about these limits. Hamstrings, supine  Lie on your back (supine position). Loop a belt, towel, or exercise band over the ball of your left / right foot. The ball of your foot is on the walking surface, right under your toes. Straighten your left / right knee and slowly pull on the belt, towel, or band to raise your leg until you feel a gentle stretch behind your knee (hamstring). Do not let your knee bend while you do this. Keep your other leg flat on the floor. Hold this position for __________ seconds. Slowly return your leg to the starting position. Repeat __________ times. Complete this exercise __________ times a day. Hip rotation  Lie on your back on a firm surface. With your left / right hand, gently pull your left / right knee toward the shoulder that is on the same side of the body. Stop when your knee is pointing toward the ceiling. Hold your left / right ankle with your other hand. Keeping your knee steady, gently pull your left / right ankle toward your other shoulder until you feel a stretch in your butt. Keep your hips and shoulders firmly planted while you do this stretch. Hold this position for __________ seconds. Repeat __________ times. Complete this exercise __________ times a day. Seated stretch This exercise is  sometimes called hamstrings and adductors stretch. Sit on the floor with your legs stretched wide. Keep your knees straight during this exercise. Keeping your head and back in a straight line, bend at your waist to reach for your left foot (position A). You should feel a stretch in your right inner thigh (adductors). Hold this position for __________ seconds. Then slowly return to the upright position. Keeping your head and back in a straight line, bend at your waist to reach forward (position B). You should feel a stretch behind both of your thighs and knees (hamstrings). Hold this position for __________ seconds. Then slowly return to the upright position. Keeping your head and back in a straight line, bend at your waist to reach for your right foot (position C). You should feel a stretch in your left inner thigh (adductors). Hold this position for __________ seconds. Then slowly return to the upright position. Repeat __________ times. Complete this exercise __________ times a day. Lunge This exercise stretches the muscles of the hip (hip flexors). Place your left / right knee on the floor and bend your other knee so that is directly over your ankle. You should be half-kneeling. Keep good posture with your head over your shoulders. Tighten your butt muscles to point your tailbone downward. This will prevent your back from arching too much. You should feel a gentle stretch in the front of your left / right thigh and hip. If you do not feel a stretch, slide your other foot forward slightly and  then slowly lunge forward with your chest up until your knee once again lines up over your ankle. Make sure your tailbone continues to point downward. Hold this position for __________ seconds. Slowly return to the starting position. Repeat __________ times. Complete this exercise __________ times a day. Strengthening exercises These exercises build strength and endurance in your hip. Endurance is the  ability to use your muscles for a long time, even after they get tired. Bridge This exercise strengthens the muscles of your hip (hip extensors). Lie on your back on a firm surface with your knees bent and your feet flat on the floor. Tighten your butt muscles and lift your bottom off the floor until the trunk of your body and your hips are level with your thighs. Do not arch your back. You should feel the muscles working in your butt and the back of your thighs. If you do not feel these muscles, slide your feet 1-2 inches (2.5-5 cm) farther away from your butt. Hold this position for __________ seconds. Slowly lower your hips to the starting position. Let your muscles relax completely between repetitions. Repeat __________ times. Complete this exercise __________ times a day. Straight leg raises, side-lying This exercise strengthens the muscles that move the hip joint away from the center of the body (hip abductors). Lie on your side with your left / right leg in the top position. Lie so your head, shoulder, hip, and knee line up. You may bend your bottom knee slightly to help you balance. Roll your hips slightly forward, so your hips are stacked directly over each other and your left / right knee is facing forward. Leading with your heel, lift your top leg 4-6 inches (10-15 cm). You should feel the muscles in your top hip lifting. Do not let your foot drift forward. Do not let your knee roll toward the ceiling. Hold this position for __________ seconds. Slowly return to the starting position. Let your muscles relax completely between repetitions. Repeat __________ times. Complete this exercise __________ times a day. Straight leg raises, side-lying This exercise strengthens the muscles that move the hip joint toward the center of the body (hip adductors). Lie on your side with your left / right leg in the bottom position. Lie so your head, shoulder, hip, and knee line up. You may place your  upper foot in front to help you balance. Roll your hips slightly forward, so your hips are stacked directly over each other and your left / right knee is facing forward. Tense the muscles in your inner thigh and lift your bottom leg 4-6 inches (10-15 cm). Hold this position for __________ seconds. Slowly return to the starting position. Let your muscles relax completely between repetitions. Repeat __________ times. Complete this exercise __________ times a day. Straight leg raises, supine This exercise strengthens the muscles in the front of your thigh (quadriceps and hip flexors). Lie on your back (supine position) with your left / right leg extended and your other knee bent. Tense the muscles in the front of your left / right thigh. You should see your kneecap slide up or see increased dimpling just above your knee. Keep these muscles tight as you raise your leg 4-6 inches (10-15 cm) off the floor. Do not let your knee bend. Hold this position for __________ seconds. Keep these muscles tense as you lower your leg. Relax the muscles slowly and completely between repetitions. Repeat __________ times. Complete this exercise __________ times a day. Hip abductors, standing This  exercise strengthens the muscles that move the leg and hip joint away from the center of the body (hip abductors). Tie one end of a rubber exercise band or tubing to a secure surface, such as a chair, table, or pole. Loop the other end of the band or tubing around your left / right ankle. Keeping your ankle with the band or tubing directly opposite the secured end, step away until there is tension in the tubing or band. Hold on to a chair, table, or pole as needed for balance. Lift your left / right leg out to your side. While you do this: Keep your back upright. Keep your shoulders over your hips. Keep your toes pointing forward. Make sure to use your hip muscles to slowly lift your leg. Do not tip your body or  forcefully lift your leg. Hold this position for __________ seconds. Slowly return to the starting position. Repeat __________ times. Complete this exercise __________ times a day. Squats This exercise strengthens the muscles in the front of your thigh (quadriceps). Stand in front of a table, or stand in a doorframe so your feet and knees are in line with the frame. You may place your hands on the table or frame for balance. Slowly bend your knees and lower your hips like you are going to sit in a chair. Keep your lower legs in a straight up-and-down position. Do not let your hips go lower than your knees. Do not bend your knees lower than told by your provider. If your hip pain increases, do not bend as low. Hold this position for ___________ seconds. Slowly push with your legs to return to standing. Do not use your hands to pull yourself to standing. Repeat __________ times. Complete this exercise __________ times a day. This information is not intended to replace advice given to you by your health care provider. Make sure you discuss any questions you have with your health care provider. Document Revised: 02/27/2022 Document Reviewed: 02/27/2022 Elsevier Patient Education  2024 [employer].

## 2024-02-13 ENCOUNTER — Ambulatory Visit: Payer: Self-pay | Admitting: Rheumatology

## 2024-02-13 NOTE — Progress Notes (Signed)
 CBC and CMP are normal.  Vitamin D  is normal at 41.

## 2024-02-18 ENCOUNTER — Encounter: Payer: Self-pay | Admitting: Cardiology

## 2024-02-18 ENCOUNTER — Telehealth: Payer: Self-pay

## 2024-02-18 NOTE — Telephone Encounter (Signed)
 Patient called the office to check on her labs advised that the two things that were abnormal was a part of the CMP an per Dr Dolphus the [facility] was WNL.

## 2024-02-26 NOTE — Progress Notes (Unsigned)
 Cardiology Office Note:    Date:  02/27/2024   ID:  Nicole Oneill, DOB [birth year], MRN [id number]  PCP:  Shayne Anes, MD  Cardiologist:  Debby Sor, MD (Inactive)  Electrophysiologist:  None   Referring MD: Shayne Anes, MD   Chief Complaint  Patient presents with   Coronary Artery Disease    History of Present Illness:    Nicole Oneill is a 63 y.o. female with a hx of CAD, Ehlers-Danlos syndrome, fibromyalgia, OSA who presents for follow-up.  Previously followed with Dr. Sor, last seen 11/2023.  Coronary CTA 05/2019 showed minimal nonobstructive CAD, calcium  score 38 (81st percentile).  Echocardiogram 07/2023 showed EF 60 to 65%, normal RV function, mild mitral regurgitation.  Stress PET 01/2024 showed normal perfusion, LVEF 62%, normal myocardial blood flow reserve, mild coronary calcifications.  Since last clinic visit, she reports she is doing well.  Does report has been having shortness of breath particularly when walking up stairs.  Denies any chest pain.  Also feels like heart is racing when walking up stairs.  She denies any lower extremity edema or palpitations.  Does report some lightheadedness but denies any syncope.  For exercise she does weightlifting, Pilates and rides stationary bike.   Past Medical History:  Diagnosis Date   Anemia    Arthritis    osteo   Atrial tachycardia (HCC)    x 10 years ago wias diagnosed with short runs of atrial tachycardia while wearing a heart monitor.  No problems since.   CAD (coronary artery disease)    noted on coronary CTA in 05/2019   Chronic dyspnea    Complication of anesthesia    urinary retention requiring indwelling  foley catheterization   DDD (degenerative disc disease)    Ehlers-Danlos syndrome type III    Fibromyalgia    History of blood transfusion    Hyperlipidemia    Hypertension    Osteoarthritis    Sleep apnea, obstructive    Thoracic outlet syndrome 07/2020   per patient     Past Surgical History:   Procedure Laterality Date   BACK SURGERY     Cervical fusion C5-7, fusion  T10-S1   DIAGNOSTIC LAPAROSCOPY     x 3   EYE SURGERY     bilateral tear duct fusion   EYE SURGERY Bilateral 03/19/2023   eyelid surgery and cyst removed from left tearduct   JOINT REPLACEMENT     partial left knee 2005, total left knee 2012   KNEE ARTHROPLASTY     KNEE ARTHROSCOPY  08/06/2012   Procedure: ARTHROSCOPY KNEE;  Surgeon: Dempsey LULLA Moan, MD;  Location: [facility];  Service: Orthopedics;  Laterality: Left;  WITH SYNOVECTOMY and debridement   OSTEOTOMY     bilateral tibial    Current Medications: Current Meds  Medication Sig   amLODipine  (NORVASC ) 2.5 MG tablet TAKE 1 TABLET(2.5 MG) BY MOUTH DAILY   amphetamine-dextroamphetamine (ADDERALL) 5 MG tablet TAKE 1 TABLET BY MOUTH EVERY DAY AS NEEDED FOR ATTENTION   atorvastatin  (LIPITOR) 10 MG tablet Take 1 tablet (10 mg total) by mouth daily.   Cholecalciferol  (VITAMIN D3) 2000 units capsule Take 2,000 Units by mouth daily.   dextroamphetamine (DEXTROSTAT) 5 MG tablet Take 5 mg by mouth as needed.   diazepam  (VALIUM ) 5 MG tablet Take 1 tablet (5 mg total) by mouth at bedtime.   doxycycline (VIBRA-TABS) 100 MG tablet Take 100 mg by mouth as needed.   estradiol (ESTRACE) 0.1 MG/GM vaginal cream Place  1 Applicatorful vaginally at bedtime.   Famotidine (PEPCID PO) Take by mouth as needed.   gabapentin  (NEURONTIN ) 300 MG capsule TAKE ONE CAPSULE BY MOUTH EVERY MORNING, 1 CAPSULE AT MIDDAY THEN 2 CAPSULES AT BEDTIME   MAGNESIUM PO Take by mouth daily.   meloxicam  (MOBIC ) 15 MG tablet Take 1 tablet (15 mg total) by mouth daily.   methocarbamol  (ROBAXIN ) 750 MG tablet Take 1 tablet (750 mg total) by mouth at bedtime as needed for muscle spasms.   MIEBO 1.338 GM/ML SOLN Apply 1 drop to eye 2 (two) times daily.   Polyethyl Glycol-Propyl Glycol 0.4-0.3 % SOLN Apply to eye.   Probiotic Product (PROBIOTIC PO) Take 1 capsule by mouth daily.   Simethicone (GAS-X PO)  as needed.   zolpidem  (AMBIEN ) 5 MG tablet Take 1 tablet (5 mg total) by mouth at bedtime as needed for sleep.     Allergies:   Patient has no known allergies.   Social History   Socioeconomic History   Marital status: Married    Spouse name: Fairy Ruth   Number of children: 3   Years of education: BA   Highest education level: Not on file  Occupational History    Employer: [employer]    Comment: [employer]   Tobacco Use   Smoking status: Never    Passive exposure: Never   Smokeless tobacco: Never  Vaping Use   Vaping status: Never Used  Substance and Sexual Activity   Alcohol  use: Yes    Alcohol /week: 3.0 standard drinks of alcohol     Types: 3 Glasses of wine per week    Comment: occ   Drug use: No   Sexual activity: Not on file  Other Topics Concern   Not on file  Social History Narrative   Patient lives at home with family.   Caffeine Use: 4-5 sodas weekly   Social Drivers of Corporate investment banker Strain: Not on file  Food Insecurity: Not on file  Transportation Needs: Not on file  Physical Activity: Not on file  Stress: Not on file  Social Connections: Not on file     Family History: The patient's family history includes Anorexia nervosa in her sister; Atrial fibrillation in her father; Cancer - Colon in her paternal grandfather and paternal grandmother; Cancer - Prostate in her father; Congestive Heart Failure in her father; Healthy in her daughter, daughter, and daughter; Heart attack in her maternal grandmother; Hypertension in her father; Stroke in her maternal grandfather and mother.  ROS:   Please see the history of present illness.     All other systems reviewed and are negative.  EKGs/Labs/Other Studies Reviewed:    The following studies were reviewed today:   EKG:  EKG is not ordered today.    Recent Labs: 02/12/2024: ALT 12; BUN 20; Creat 0.80; Hemoglobin 12.8; Platelets 205; Potassium 4.5; Sodium 141  Recent Lipid Panel     Component Value Date/Time   CHOL 164 09/25/2023 0924   TRIG 41 09/25/2023 0924   HDL 102 09/25/2023 0924   CHOLHDL 1.6 09/25/2023 0924   CHOLHDL 2.8 05/19/2019 0504   VLDL 10 05/19/2019 0504   LDLCALC 53 09/25/2023 0924    Physical Exam:    VS:  BP 116/70 (BP Location: Right Arm, Patient Position: Sitting, Cuff Size: Normal)   Pulse 69   Ht 5' 5 (1.651 m)   Wt 131 lb 12.8 oz (59.8 kg)   SpO2 97%   BMI 21.93 kg/m  Wt Readings from Last 3 Encounters:  02/27/24 131 lb 12.8 oz (59.8 kg)  02/12/24 132 lb (59.9 kg)  01/28/24 130 lb (59 kg)     GEN:  Well nourished, well developed in no acute distress HEENT: Normal NECK: No JVD; No carotid bruits LYMPHATICS: No lymphadenopathy CARDIAC: RRR, no murmurs, rubs, gallops RESPIRATORY:  Clear to auscultation without rales, wheezing or rhonchi  ABDOMEN: Soft, non-tender, non-distended MUSCULOSKELETAL:  No edema; No deformity  SKIN: Warm and dry NEUROLOGIC:  Alert and oriented x 3 PSYCHIATRIC:  Normal affect   ASSESSMENT:    1. Coronary artery disease involving native coronary artery of native heart without angina pectoris   2. Essential hypertension   3. Hyperlipidemia, unspecified hyperlipidemia type    PLAN:    CAD: Coronary CTA 05/2019 showed minimal nonobstructive CAD, calcium  score 38 (81st percentile).  Echocardiogram 07/2023 showed EF 60 to 65%, normal RV function, mild mitral regurgitation.  Stress PET 01/2024 showed normal perfusion, LVEF 62%, normal myocardial blood flow reserve, mild coronary calcifications. - Continue atorvastatin  10 mg daily  Hypertension: On amlodipine  2.5 mg daily (was also started on amlodipine  for possible coronary vasospasm).  Appears controlled.   Hyperlipidemia: Continue atorvastatin  10 mg daily.  LDL 53 on 09/25/23.    RTC in 1 year   Medication Adjustments/Labs and Tests Ordered: Current medicines are reviewed at length with the patient today.  Concerns regarding medicines are  outlined above.  No orders of the defined types were placed in this encounter.  No orders of the defined types were placed in this encounter.   Patient Instructions  Medication Instructions:  Continue current medications *If you need a refill on your cardiac medications before your next appointment, please call your pharmacy*  Lab Work: none If you have labs (blood work) drawn today and your tests are completely normal, you will receive your results only by: MyChart Message (if you have MyChart) OR A paper copy in the mail If you have any lab test that is abnormal or we need to change your treatment, we will call you to review the results.  Testing/Procedures: none  Follow-Up: At [facility], you and your health needs are our priority.  As part of our continuing mission to provide you with exceptional heart care, our providers are all part of one team.  This team includes your primary Cardiologist (physician) and Advanced Practice Providers or APPs (Physician Assistants and Nurse Practitioners) who all work together to provide you with the care you need, when you need it.  Your next appointment:   1 year  Provider:   Dr. Kate  We recommend signing up for the patient portal called MyChart.  Sign up information is provided on this After Visit Summary.  MyChart is used to connect with patients for Virtual Visits (Telemedicine).  Patients are able to view lab/test results, encounter notes, upcoming appointments, etc.  Non-urgent messages can be sent to your provider as well.   To learn more about what you can do with MyChart, go to [email or web address].   Other Instructions none       Signed, Lonni LITTIE Kate, MD  02/27/2024 1:46 PM    [facility]

## 2024-02-27 ENCOUNTER — Encounter: Payer: Self-pay | Admitting: Cardiology

## 2024-02-27 ENCOUNTER — Ambulatory Visit: Attending: Cardiology | Admitting: Cardiology

## 2024-02-27 VITALS — BP 116/70 | HR 69 | Ht 65.0 in | Wt 131.8 lb

## 2024-02-27 DIAGNOSIS — I10 Essential (primary) hypertension: Secondary | ICD-10-CM | POA: Diagnosis present

## 2024-02-27 DIAGNOSIS — I2510 Atherosclerotic heart disease of native coronary artery without angina pectoris: Secondary | ICD-10-CM | POA: Insufficient documentation

## 2024-02-27 DIAGNOSIS — E785 Hyperlipidemia, unspecified: Secondary | ICD-10-CM | POA: Diagnosis present

## 2024-02-27 NOTE — Patient Instructions (Signed)
 Medication Instructions:  Continue current medications *If you need a refill on your cardiac medications before your next appointment, please call your pharmacy*  Lab Work: none If you have labs (blood work) drawn today and your tests are completely normal, you will receive your results only by: MyChart Message (if you have MyChart) OR A paper copy in the mail If you have any lab test that is abnormal or we need to change your treatment, we will call you to review the results.  Testing/Procedures: none  Follow-Up: At [facility], you and your health needs are our priority.  As part of our continuing mission to provide you with exceptional heart care, our providers are all part of one team.  This team includes your primary Cardiologist (physician) and Advanced Practice Providers or APPs (Physician Assistants and Nurse Practitioners) who all work together to provide you with the care you need, when you need it.  Your next appointment:   1 year  Provider:   Dr. Kate  We recommend signing up for the patient portal called MyChart.  Sign up information is provided on this After Visit Summary.  MyChart is used to connect with patients for Virtual Visits (Telemedicine).  Patients are able to view lab/test results, encounter notes, upcoming appointments, etc.  Non-urgent messages can be sent to your provider as well.   To learn more about what you can do with MyChart, go to [email or web address].   Other Instructions none

## 2024-03-12 ENCOUNTER — Encounter: Payer: Self-pay | Admitting: Sports Medicine

## 2024-03-12 ENCOUNTER — Ambulatory Visit: Admitting: Sports Medicine

## 2024-03-12 VITALS — BP 110/60 | Ht 65.0 in | Wt 130.0 lb

## 2024-03-12 DIAGNOSIS — M79672 Pain in left foot: Secondary | ICD-10-CM

## 2024-03-12 DIAGNOSIS — G8929 Other chronic pain: Secondary | ICD-10-CM | POA: Diagnosis not present

## 2024-03-12 NOTE — Progress Notes (Signed)
 PCP: Shayne Anes, MD  Subjective:   HPI: Patient is a 63 y.o. female here for recurrent left foot pain.  She was previously seen in June endorsing left foot pain.  Ultrasound showed degenerative changes at the TMT joint with noted effusion.  A scaphoid pad was added to her orthotic and she was given a foot strap.  She continued icing and applying topical Voltaren gel.  This initially alleviated her pain however she says that her pain has returned in recent weeks and is more severe than previously.  She endorses pain along the medial forefoot.  She notes that her pain was mostly dorsal previously but now she has pain in her arch.  Pain is worse with ambulation in general and seems to be alleviated with rest and taking off her shoes.  Past Medical History:  Diagnosis Date   Anemia    Arthritis    osteo   Atrial tachycardia (HCC)    x 10 years ago wias diagnosed with short runs of atrial tachycardia while wearing a heart monitor.  No problems since.   CAD (coronary artery disease)    noted on coronary CTA in 05/2019   Chronic dyspnea    Complication of anesthesia    urinary retention requiring indwelling  foley catheterization   DDD (degenerative disc disease)    Ehlers-Danlos syndrome type III    Fibromyalgia    History of blood transfusion    Hyperlipidemia    Hypertension    Osteoarthritis    Sleep apnea, obstructive    Thoracic outlet syndrome 07/2020   per patient     Current Outpatient Medications on File Prior to Visit  Medication Sig Dispense Refill   amLODipine  (NORVASC ) 2.5 MG tablet TAKE 1 TABLET(2.5 MG) BY MOUTH DAILY 90 tablet 3   amphetamine-dextroamphetamine (ADDERALL) 5 MG tablet TAKE 1 TABLET BY MOUTH EVERY DAY AS NEEDED FOR ATTENTION     atorvastatin  (LIPITOR) 10 MG tablet Take 1 tablet (10 mg total) by mouth daily. 90 tablet 3   Calcium  Carbonate-Vitamin D  (CALTRATE 600+D PO) Take 1 tablet by mouth daily.  (Patient not taking: Reported on 02/27/2024)      Cholecalciferol  (VITAMIN D3) 2000 units capsule Take 2,000 Units by mouth daily.     dextroamphetamine (DEXTROSTAT) 5 MG tablet Take 5 mg by mouth as needed.     diazepam  (VALIUM ) 5 MG tablet Take 1 tablet (5 mg total) by mouth at bedtime. 30 tablet 1   doxycycline (VIBRA-TABS) 100 MG tablet Take 100 mg by mouth as needed.     estradiol (ESTRACE) 0.1 MG/GM vaginal cream Place 1 Applicatorful vaginally at bedtime.     Famotidine (PEPCID PO) Take by mouth as needed.     gabapentin  (NEURONTIN ) 300 MG capsule TAKE ONE CAPSULE BY MOUTH EVERY MORNING, 1 CAPSULE AT MIDDAY THEN 2 CAPSULES AT BEDTIME 360 capsule 1   MAGNESIUM PO Take by mouth daily.     meloxicam  (MOBIC ) 15 MG tablet Take 1 tablet (15 mg total) by mouth daily. 30 tablet 5   methocarbamol  (ROBAXIN ) 750 MG tablet Take 1 tablet (750 mg total) by mouth at bedtime as needed for muscle spasms. 30 tablet 2   MIEBO 1.338 GM/ML SOLN Apply 1 drop to eye 2 (two) times daily.     Polyethyl Glycol-Propyl Glycol 0.4-0.3 % SOLN Apply to eye.     Probiotic Product (PROBIOTIC PO) Take 1 capsule by mouth daily.     Simethicone (GAS-X PO) as needed.  zolpidem  (AMBIEN ) 5 MG tablet Take 1 tablet (5 mg total) by mouth at bedtime as needed for sleep. 30 tablet 0   No current facility-administered medications on file prior to visit.    Past Surgical History:  Procedure Laterality Date   BACK SURGERY     Cervical fusion C5-7, fusion  T10-S1   DIAGNOSTIC LAPAROSCOPY     x 3   EYE SURGERY     bilateral tear duct fusion   EYE SURGERY Bilateral 03/19/2023   eyelid surgery and cyst removed from left tearduct   JOINT REPLACEMENT     partial left knee 2005, total left knee 2012   KNEE ARTHROPLASTY     KNEE ARTHROSCOPY  08/06/2012   Procedure: ARTHROSCOPY KNEE;  Surgeon: Dempsey LULLA Moan, MD;  Location: [facility];  Service: Orthopedics;  Laterality: Left;  WITH SYNOVECTOMY and debridement   OSTEOTOMY     bilateral tibial    No Known Allergies  BP  110/60   Ht 5' 5 (1.651 m)   Wt 130 lb (59 kg)   BMI 21.63 kg/m      03/24/2020    3:16 PM 04/06/2020    1:36 PM 06/21/2020   11:15 AM  [facility] Adult [facility]  Frequency of aerobic exercise (# of days/week) 4 4 4   Average time in minutes 30 30 40  Frequency of strengthening activities (# of days/week) 2 2 2         No data to display              Objective:  Physical Exam:  Gen: NAD, comfortable in exam room  Bilateral feet No obvious deformity or swelling noted on inspection Bulging of the navicular head of the left foot noted with longitudinal arch collapse She maintains the longitudinal arch on the right foot There is tenderness to palpation over the first metatarsal as well as along the longitudinal arch No TTP over the plantar fascia at the calcaneal insertion FROM at the left ankle and great toe, though she endorses mild pain with passive flexion of the great toe   Assessment & Plan:  1.  Chronic pain of left foot Presenting today for acute on chronic pain of the left foot.  Previous ultrasound has shown degenerative changes at the TMT joint.  Her current pain is located over the first metatarsal and longitudinal arch, worse with ambulation.  Custom Fit n Run orthotics were made today to replace her previous orthotics and scaphoid padding to see if this improves her pain. She was able to ambulate without significant discomfort and endorsed mild improvement in pain.  Recommend continued icing and use of Voltaren gel as needed for pain relief.  She has previously tried a foot strap but stated it was too painful.  She will return to care for previously scheduled follow-up on 9/23.  Can further adjust orthotics if needed at that time. -Her left hip pain is gradually improving.  Noted improvement in strength on exam today.  Recommend continuing her current home exercise program and follow-up as previously scheduled on 9/23.

## 2024-03-13 ENCOUNTER — Other Ambulatory Visit: Payer: Self-pay | Admitting: Cardiology

## 2024-03-13 MED ORDER — AMLODIPINE BESYLATE 2.5 MG PO TABS: 2.5000 mg | ORAL_TABLET | Freq: Every day | ORAL | 3 refills | Status: AC

## 2024-03-16 ENCOUNTER — Other Ambulatory Visit: Payer: Self-pay

## 2024-03-16 MED ORDER — GABAPENTIN 300 MG PO CAPS: ORAL_CAPSULE | ORAL | 3 refills | Status: AC

## 2024-03-16 NOTE — Progress Notes (Signed)
 Pt is asking for a refill on gabapentin  (NEURONTIN ) 300 MG capsule

## 2024-03-17 ENCOUNTER — Other Ambulatory Visit: Payer: Self-pay | Admitting: Obstetrics and Gynecology

## 2024-03-17 DIAGNOSIS — Z1231 Encounter for screening mammogram for malignant neoplasm of breast: Secondary | ICD-10-CM

## 2024-03-31 ENCOUNTER — Ambulatory Visit (INDEPENDENT_AMBULATORY_CARE_PROVIDER_SITE_OTHER): Admitting: Sports Medicine

## 2024-03-31 VITALS — BP 110/70 | Ht 65.0 in | Wt 130.0 lb

## 2024-03-31 DIAGNOSIS — S76012D Strain of muscle, fascia and tendon of left hip, subsequent encounter: Secondary | ICD-10-CM

## 2024-03-31 MED ORDER — CYCLOBENZAPRINE HCL 10 MG PO TABS: 10.0000 mg | ORAL_TABLET | Freq: Every evening | ORAL | 0 refills | Status: DC | PRN

## 2024-03-31 NOTE — Assessment & Plan Note (Signed)
 We gave her a trial of ESWT today If this is helpful with her chronic hip pain we may want to do a series of 5-6 sessions She is doing well with her hip exercise series and this may also help I encouraged her to keep on the home exercise program  With her back fusions is highly likely that she has some referred issues from her lumbar spine. With her terrible sleep pattern we will try starting Flexeril  10 at night and stop the Robaxin  She is to let us  know how effective this is and will return to see me in 2 to 3 months

## 2024-03-31 NOTE — Progress Notes (Signed)
 Patient returns for follow-up of her Ehlers-Danlos syndrome and chronic left foot pain  On her last visit we made her new orthotics.  Those have really helped and she has had much less foot pain since that time.  She will continue wearing these.  A second problem is chronic left hip weakness and pain.  She has been faithful to her hip exercise regimen and has worked with Pilates to try to strengthen this.  She feels this is improved but still painful.  The third problem is that she has rods up and down her entire spine and she is having a great deal of difficulty sleeping through the night usually only gets about 5 hours.  She wakes with back pain and cannot find a comfortable mattress after she has tried more than 10.  At night she uses Ambien  Robaxin  and 600 of gabapentin  and still does not sleep through the night.  Physical exam  pleasant white female in no acute distress BP 110/70   Ht 5' 5 (1.651 m)   Wt 130 lb (59 kg)   BMI 21.63 kg/m   Examination of the left hip reveals point tenderness directly over the greater trochanter at the middle and posterior facets. Her hip range of motion is good. Her hip abduction strength has considerably improved.  Trial of ESWT  Location left greater trochanter Impulses 2000 Power level 60 mJ Frequency 12 Head size large  Patient tolerated the procedure with no significant pain.  She was cautioned about bruising and to see if she gets a response over the next 48 hours.

## 2024-04-06 ENCOUNTER — Encounter: Payer: Self-pay | Admitting: Sports Medicine

## 2024-04-07 ENCOUNTER — Other Ambulatory Visit: Payer: Self-pay

## 2024-04-07 ENCOUNTER — Encounter (HOSPITAL_COMMUNITY): Payer: Self-pay | Admitting: *Deleted

## 2024-04-07 ENCOUNTER — Emergency Department (HOSPITAL_COMMUNITY)

## 2024-04-07 ENCOUNTER — Emergency Department (HOSPITAL_COMMUNITY)
Admission: EM | Admit: 2024-04-07 | Discharge: 2024-04-07 | Disposition: A | Discharge status: Home / Self Care | Attending: Emergency Medicine | Admitting: Emergency Medicine

## 2024-04-07 DIAGNOSIS — D72819 Decreased white blood cell count, unspecified: Secondary | ICD-10-CM | POA: Diagnosis not present

## 2024-04-07 DIAGNOSIS — I2510 Atherosclerotic heart disease of native coronary artery without angina pectoris: Secondary | ICD-10-CM | POA: Diagnosis not present

## 2024-04-07 DIAGNOSIS — Z79899 Other long term (current) drug therapy: Secondary | ICD-10-CM | POA: Insufficient documentation

## 2024-04-07 DIAGNOSIS — R0789 Other chest pain: Secondary | ICD-10-CM | POA: Insufficient documentation

## 2024-04-07 DIAGNOSIS — I10 Essential (primary) hypertension: Secondary | ICD-10-CM | POA: Insufficient documentation

## 2024-04-07 LAB — BASIC METABOLIC PANEL WITH GFR
Anion gap: 12 (ref 5–15)
BUN: 13 mg/dL (ref 8–23)
CO2: 27 mmol/L (ref 22–32)
Calcium: 9 mg/dL (ref 8.9–10.3)
Chloride: 101 mmol/L (ref 98–111)
Creatinine, Ser: 0.87 mg/dL (ref 0.44–1.00)
GFR, Estimated: >60 mL/min (ref >60)
Glucose, Bld: 112 mg/dL — ABNORMAL HIGH (ref 70–99)
Potassium: 4 mmol/L (ref 3.5–5.1)
Sodium: 140 mmol/L (ref 135–145)

## 2024-04-07 LAB — CBC
HCT: 39.5 % (ref 36.0–46.0)
Hemoglobin: 13.1 g/dL (ref 12.0–15.0)
MCH: 29.2 pg (ref 26.0–34.0)
MCHC: 33.2 g/dL (ref 30.0–36.0)
MCV: 88 fL (ref 80.0–100.0)
Platelets: 174 K/uL (ref 150–400)
RBC: 4.49 MIL/uL (ref 3.87–5.11)
RDW: 12.6 % (ref 11.5–15.5)
WBC: 3.3 K/uL — ABNORMAL LOW (ref 4.0–10.5)
nRBC: 0 % (ref 0.0–0.2)

## 2024-04-07 LAB — TROPONIN I (HIGH SENSITIVITY)
Troponin I (High Sensitivity): 2 ng/L (ref <18)
Troponin I (High Sensitivity): 3 ng/L (ref <18)

## 2024-04-07 NOTE — Discharge Instructions (Addendum)
 Your workup today was reassuring, you can follow-up with your cardiologist as needed, please return if you have return of chest pain, shortness of breath.

## 2024-04-07 NOTE — ED Provider Triage Note (Signed)
 Emergency Medicine Provider Triage Evaluation Note  CHIMERE KLINGENSMITH , a 63 y.o. female  was evaluated in triage.  Pt complains of chest pain while she was at [facility]. Felt sweaty and lightheaded as well. It has resolved at this time.   Review of Systems  Positive: Chest pain, diaphoresis, lightheadedness Negative: Shortness of breath, back pain, vomiting   Physical Exam  BP 139/69   Pulse 62   Temp 98.4 F (36.9 C)   Resp 16   SpO2 100%  Gen:   Awake, no distress   Resp:  Normal effort  MSK:   Moves extremities without difficulty    Medical Decision Making  Medically screening exam initiated at 11:00 AM.  Appropriate orders placed.  Allyn G Onley was informed that the remainder of the evaluation will be completed by another provider, this initial triage assessment does not replace that evaluation, and the importance of remaining in the ED until their evaluation is complete.     Gennaro Bouchard L, DO 04/07/24 1100

## 2024-04-07 NOTE — ED Provider Notes (Signed)
 [facility] Provider Note   CSN: [id number] Arrival date & time: 04/07/24  1016     Patient presents with: Chest Pain   Nicole Oneill is a 63 y.o. female with past medical history seen for fibromyalgia, arthritis, Ehlers-Danlos, degenerative disc disease, hyperlipidemia, CAD, hypertension who presents concern for sharp stabbing central chest pain [facility] around 9:30 [facility].  She endorsed some generalized weakness and feeling lightheaded.  She reports the symptoms lasted for around 15 to 20 minutes before resolving.  She reports mild weakness still present but overall feels totally resolved.  She reports NSTEMI that they had attributed to possible coronary artery vasospasm previously.  No stents in place, does not take any blood thinners.  She reports that she was not exerting herself at time of onset of chest pain.    Chest Pain      Prior to Admission medications   Medication Sig Start Date End Date Taking? Authorizing Provider  amLODipine  (NORVASC ) 2.5 MG tablet Take 1 tablet (2.5 mg total) by mouth daily. 03/13/24   Kate Lonni CROME, MD  amphetamine-dextroamphetamine (ADDERALL) 5 MG tablet TAKE 1 TABLET BY MOUTH EVERY DAY AS NEEDED FOR ATTENTION 09/26/21   [provider]  atorvastatin  (LIPITOR) 10 MG tablet Take 1 tablet (10 mg total) by mouth daily. 05/20/23 02/27/24  Burnard Debby LABOR, MD  Calcium  Carbonate-Vitamin D  (CALTRATE 600+D PO) Take 1 tablet by mouth daily.  Patient not taking: Reported on 02/27/2024    [provider]  Cholecalciferol  (VITAMIN D3) 2000 units capsule Take 2,000 Units by mouth daily.    [provider]  cyclobenzaprine  (FLEXERIL ) 10 MG tablet Take 1 tablet (10 mg total) by mouth at bedtime as needed for muscle spasms. 03/31/24   Harvey Seltzer, MD  dextroamphetamine (DEXTROSTAT) 5 MG tablet Take 5 mg by mouth as needed. 11/20/19   [provider]  diazepam  (VALIUM ) 5 MG tablet  Take 1 tablet (5 mg total) by mouth at bedtime. 07/26/23   Jha, Panav, MD  doxycycline (VIBRA-TABS) 100 MG tablet Take 100 mg by mouth as needed. 04/01/22   [provider]  estradiol (ESTRACE) 0.1 MG/GM vaginal cream Place 1 Applicatorful vaginally at bedtime. 06/04/22   [provider]  Famotidine (PEPCID PO) Take by mouth as needed.    [provider]  gabapentin  (NEURONTIN ) 300 MG capsule TAKE ONE CAPSULE BY MOUTH EVERY MORNING, 1 CAPSULE AT MIDDAY THEN 2 CAPSULES AT BEDTIME 03/16/24   Harvey Seltzer, MD  MAGNESIUM PO Take by mouth daily.    [provider]  meloxicam  (MOBIC ) 15 MG tablet Take 1 tablet (15 mg total) by mouth daily. 05/15/22   Harvey Seltzer, MD  methocarbamol  (ROBAXIN ) 750 MG tablet Take 1 tablet (750 mg total) by mouth at bedtime as needed for muscle spasms. 01/14/24   Cheryl Waddell HERO, PA-C  MIEBO 1.338 GM/ML SOLN Apply 1 drop to eye 2 (two) times daily. 12/10/23   [provider]  Polyethyl Glycol-Propyl Glycol 0.4-0.3 % SOLN Apply to eye.    [provider]  Probiotic Product (PROBIOTIC PO) Take 1 capsule by mouth daily.    [provider]  Simethicone (GAS-X PO) as needed.    [provider]  zolpidem  (AMBIEN ) 5 MG tablet Take 1 tablet (5 mg total) by mouth at bedtime as needed for sleep. 08/13/23   Dolphus Reiter, MD    Allergies: Patient has no known allergies.    Review of  Systems  Cardiovascular:  Positive for chest pain.  All other systems reviewed and are negative.   Updated Vital Signs BP 132/62   Pulse 62   Temp 97.6 F (36.4 C) (Oral)   Resp 17   SpO2 100%   Physical Exam Vitals and nursing note reviewed.  Constitutional:      General: She is not in acute distress.    Appearance: Normal appearance.  HENT:     Head: Normocephalic and atraumatic.  Eyes:     General:        Right eye: No discharge.        Left eye: No discharge.  Cardiovascular:     Rate and Rhythm: Normal rate and  regular rhythm.     Heart sounds: No murmur heard.    No friction rub. No gallop.  Pulmonary:     Effort: Pulmonary effort is normal.     Breath sounds: Normal breath sounds.     Comments: No wheezing, rhonchi, stridor, rales Abdominal:     General: Bowel sounds are normal.     Palpations: Abdomen is soft.  Skin:    General: Skin is warm and dry.     Capillary Refill: Capillary refill takes less than 2 seconds.  Neurological:     Mental Status: She is alert and oriented to person, place, and time.  Psychiatric:        Mood and Affect: Mood normal.        Behavior: Behavior normal.     (all labs ordered are listed, but only abnormal results are displayed) Labs Reviewed  BASIC METABOLIC PANEL WITH GFR - Abnormal; Notable for the following components:      Result Value   Glucose, Bld 112 (*)    All other components within normal limits  CBC - Abnormal; Notable for the following components:   WBC 3.3 (*)    All other components within normal limits  TROPONIN I (HIGH SENSITIVITY)  TROPONIN I (HIGH SENSITIVITY)    EKG: EKG Interpretation Date/Time:  Tuesday April 07 2024 10:31:22 EDT Ventricular Rate:  58 PR Interval:  196 QRS Duration:  74 QT Interval:  408 QTC Calculation: 400 R Axis:   81  Text Interpretation: Sinus bradycardia Age indeterminate anterior infarct Compared with prior EKG from 11/20/2023 Confirmed by Gennaro Bouchard ([id number]) on 04/07/2024 10:52:53 AM  Radiology: [facility] Chest 2 View Result Date: 04/07/2024 CLINICAL DATA:  CP, near syncope EXAM: CHEST - 2 VIEW COMPARISON:  03/21/2021 FINDINGS: No focal airspace consolidation, pleural effusion, or pneumothorax. No cardiomegaly.No acute fracture or destructive lesion. Bilateral Harrington fusion rods again noted with cervical fusion hardware. IMPRESSION: No acute cardiopulmonary abnormality. Electronically Signed   By: Rogelia Myers M.D.   On: 04/07/2024 11:42     Procedures   Medications Ordered in the ED  - No data to display                                  Medical Decision Making Amount and/or Complexity of Data Reviewed Labs: ordered. Radiology: ordered.   This patient is a 63 y.o. female  who presents to the ED for concern of chest pain.   Differential diagnoses prior to evaluation: The emergent differential diagnosis includes, but is not limited to,  ACS, AAS, PE, Mallory-Weiss, Boerhaave's, Pneumonia, acute bronchitis, asthma or COPD exacerbation, anxiety, MSK pain or traumatic injury to the chest, acid reflux versus other .  This is not an exhaustive differential.   Past Medical History / Co-morbidities / Social History: fibromyalgia, arthritis, Ehlers-Danlos, degenerative disc disease, hyperlipidemia, CAD, hypertension  Additional history: Chart reviewed. Pertinent results include: Reviewed previous cardiac perfusion, echo, CT coronary, echo from 2025 with normal systolic and diastolic function, EF 60 to 65%.  She had some mild mitral regurgitation.  Physical Exam: Physical exam performed. The pertinent findings include: Vital signs stable in the emergency department, normotensive, stable oxygen saturation on room air.  No reproducible tenderness to the chest or abdomen.  No respiratory distress or accessory breath sounds.  Lab Tests/Imaging studies: I personally interpreted labs/imaging and the pertinent results include: CBC with very mild leukocytopenia, white blood cells 3.3.  BMP overall unremarkable, initial troponin 2, with delta at 3, given her atypical presentation of chest pain, no ongoing pain, no significantly identified previous CAD, do not feel that there is a primary cardiac source of her symptoms today.  Given no tachycardia, no pleurisy, no hemoptysis, no recent travel overall low clinical suspicion for PE or other pulmonary etiology.  I dependently interpreted plain film chest x-ray which shows no evidence of acute intrathoracic abnormality. I agree with the  radiologist interpretation.  Cardiac monitoring: EKG obtained and interpreted by myself and attending physician which shows: Sinus bradycardia, very mild bradycardic rate of around 58.  No acute ST-T changes  Disposition: After consideration of the diagnostic results and the patients response to treatment, I feel that patient with atypical presentation of chest pain.  emergency department workup does not suggest an emergent condition requiring admission or immediate intervention beyond what has been performed at this time. The plan is: as above. The patient is safe for discharge and has been instructed to return immediately for worsening symptoms, change in symptoms or any other concerns.   Final diagnoses:  Atypical chest pain    ED Discharge Orders     None          Rosan Sherlean VEAR DEVONNA 04/07/24 1455    Levander Houston, MD 04/14/24 1127

## 2024-04-07 NOTE — ED Triage Notes (Signed)
 Pt states that she began having stabbing central chest pain [facility] and then began having some generalized weakness and felt like she might faint, light headed and possibly a little sweaty.  Pt was able to drive home and went to lay down.  No CP now, feelings of weakness are still mildly present.

## 2024-04-09 ENCOUNTER — Other Ambulatory Visit: Payer: Self-pay | Admitting: Physician Assistant

## 2024-04-09 NOTE — Telephone Encounter (Signed)
 Last Fill: 01/14/2024  Next Visit: 08/14/2024  Last Visit: 02/12/2024  DX: DDD (degenerative disc disease), thoracic   Current Dose per office note on 02/12/2024: dose not mentioned   Okay to refill robaxin ?

## 2024-04-10 ENCOUNTER — Other Ambulatory Visit: Payer: Self-pay

## 2024-04-13 ENCOUNTER — Encounter: Payer: Self-pay | Admitting: Family Medicine

## 2024-04-13 ENCOUNTER — Ambulatory Visit (INDEPENDENT_AMBULATORY_CARE_PROVIDER_SITE_OTHER): Payer: Self-pay | Admitting: Family Medicine

## 2024-04-13 DIAGNOSIS — S76012D Strain of muscle, fascia and tendon of left hip, subsequent encounter: Secondary | ICD-10-CM

## 2024-04-13 NOTE — Patient Instructions (Signed)

## 2024-04-13 NOTE — Progress Notes (Signed)
 FOLLOW-UP VISIT:  Shockwave Therapy Procedure   NAME:  Nicole Oneill DOB:  [birth year] MR#: [id number] DATE OF VISIT:  04/13/2024  Encounter:   Nicole Oneill comes in for a follow up evaluation and 2nd Radial Extracorporeal Shockwave therapy (ESWT) treatment to the  left hip.  Nicole Oneill reports improvement a few days after last treatment - feeling about 70% improved    ESWT Procedure Note: Treatment #2  Diagnosis: Lt gluteal strain  Procedure Details  Consent for the procedure was obtained. Time out was performed. The anatomical landmarks and target areas for the procedure were identified.   PROCEDURE: ESWT was discussed with the patient in detail.  The risk and benefits were explained.  The point of maximal tenderness was identified and the oscillator probe was applied with moderate pressure. Treatment adjusted as mediated by patient feedback/pain.  Lt greater trochanter was targeted for ESWT  Preset: Greater Troch Bursitis Power level: 80 MJ Frequency: 12 Hz Impulses: 2000 Head size: large   Complications:  None; patient tolerated the procedure well.  ASSESSMENT/PLAN 1. Strain of gluteus medius of left lower extremity, subsequent encounter 70% improved after 1st shockwave tx  Plan:   ESWT completed today as noted above Return in 1 week for ESWT procedure #3 if still having pain Procedure aftercare reviewed Recommended avoiding strenuous activities and using OTC analgesia prn.

## 2024-04-20 ENCOUNTER — Ambulatory Visit

## 2024-04-27 ENCOUNTER — Ambulatory Visit
Admission: RE | Admit: 2024-04-27 | Discharge: 2024-04-27 | Disposition: A | Discharge status: Home / Self Care | Source: Ambulatory Visit | Attending: Obstetrics and Gynecology | Admitting: Obstetrics and Gynecology

## 2024-04-27 DIAGNOSIS — Z1231 Encounter for screening mammogram for malignant neoplasm of breast: Secondary | ICD-10-CM

## 2024-05-10 ENCOUNTER — Other Ambulatory Visit: Payer: Self-pay | Admitting: Sports Medicine

## 2024-05-14 ENCOUNTER — Ambulatory Visit (INDEPENDENT_AMBULATORY_CARE_PROVIDER_SITE_OTHER): Payer: Self-pay | Admitting: Sports Medicine

## 2024-05-14 ENCOUNTER — Encounter: Payer: Self-pay | Admitting: Sports Medicine

## 2024-05-14 VITALS — BP 108/70 | Ht 65.0 in | Wt 128.0 lb

## 2024-05-14 DIAGNOSIS — G8929 Other chronic pain: Secondary | ICD-10-CM | POA: Diagnosis not present

## 2024-05-14 DIAGNOSIS — S76012D Strain of muscle, fascia and tendon of left hip, subsequent encounter: Secondary | ICD-10-CM | POA: Diagnosis present

## 2024-05-14 DIAGNOSIS — M546 Pain in thoracic spine: Secondary | ICD-10-CM | POA: Diagnosis not present

## 2024-05-14 MED ORDER — DIAZEPAM 5 MG PO TABS: 5.0000 mg | ORAL_TABLET | Freq: Every day | ORAL | 1 refills | Status: AC

## 2024-05-14 NOTE — Progress Notes (Signed)
 Established Patient Office Visit  PCP: Shayne Anes, MD  Patient is a 63 y.o. female here for follow-up of chronic left hip pain and weakness attributed to a gluteus medius strain.  She was last evaluated on 10/6 for shockwave treatment #2.  She endorsed 70% improvement in pain at that time.  Today she states that the pain in her left hip is almost completely resolved.  She still has mild discomfort and is interested in completing an additional session of shockwave in hopes of complete resolution.  She has maintained her home exercise program.  Her additional concern today is chronic, diffuse back pain.  Her past medical history is significant for multilevel fusion.  Most recently, she tried taking Flexeril  but had an adverse reaction.  She would like to discuss alternatives for pain control.   Past Medical History:  Diagnosis Date   Anemia    Arthritis    osteo   Atrial tachycardia    x 10 years ago wias diagnosed with short runs of atrial tachycardia while wearing a heart monitor.  No problems since.   CAD (coronary artery disease)    noted on coronary CTA in 05/2019   Chronic dyspnea    Complication of anesthesia    urinary retention requiring indwelling  foley catheterization   DDD (degenerative disc disease)    Ehlers-Danlos syndrome type III    Fibromyalgia    History of blood transfusion    Hyperlipidemia    Hypertension    Osteoarthritis    Sleep apnea, obstructive    Thoracic outlet syndrome 07/2020   per patient     Current Outpatient Medications on File Prior to Visit  Medication Sig Dispense Refill   amLODipine  (NORVASC ) 2.5 MG tablet Take 1 tablet (2.5 mg total) by mouth daily. 90 tablet 3   amphetamine-dextroamphetamine (ADDERALL) 5 MG tablet TAKE 1 TABLET BY MOUTH EVERY DAY AS NEEDED FOR ATTENTION     atorvastatin  (LIPITOR) 10 MG tablet Take 1 tablet (10 mg total) by mouth daily. 90 tablet 3   Calcium  Carbonate-Vitamin D  (CALTRATE 600+D PO) Take 1 tablet by  mouth daily.  (Patient not taking: Reported on 02/27/2024)     Cholecalciferol  (VITAMIN D3) 2000 units capsule Take 2,000 Units by mouth daily.     dextroamphetamine (DEXTROSTAT) 5 MG tablet Take 5 mg by mouth as needed.     doxycycline (VIBRA-TABS) 100 MG tablet Take 100 mg by mouth as needed.     estradiol (ESTRACE) 0.1 MG/GM vaginal cream Place 1 Applicatorful vaginally at bedtime.     Famotidine (PEPCID PO) Take by mouth as needed.     gabapentin  (NEURONTIN ) 300 MG capsule TAKE ONE CAPSULE BY MOUTH EVERY MORNING, 1 CAPSULE AT MIDDAY THEN 2 CAPSULES AT BEDTIME 360 capsule 3   MAGNESIUM PO Take by mouth daily.     meloxicam  (MOBIC ) 15 MG tablet Take 1 tablet (15 mg total) by mouth daily. 30 tablet 5   methocarbamol  (ROBAXIN ) 750 MG tablet Take 1 tablet (750 mg total) by mouth at bedtime as needed for muscle spasms. 30 tablet 2   MIEBO 1.338 GM/ML SOLN Apply 1 drop to eye 2 (two) times daily.     Polyethyl Glycol-Propyl Glycol 0.4-0.3 % SOLN Apply to eye.     Probiotic Product (PROBIOTIC PO) Take 1 capsule by mouth daily.     Simethicone (GAS-X PO) as needed.     zolpidem  (AMBIEN ) 5 MG tablet Take 1 tablet (5 mg total) by mouth at bedtime  as needed for sleep. 30 tablet 0   No current facility-administered medications on file prior to visit.    Past Surgical History:  Procedure Laterality Date   BACK SURGERY     Cervical fusion C5-7, fusion  T10-S1   DIAGNOSTIC LAPAROSCOPY     x 3   EYE SURGERY     bilateral tear duct fusion   EYE SURGERY Bilateral 03/19/2023   eyelid surgery and cyst removed from left tearduct   JOINT REPLACEMENT     partial left knee 2005, total left knee 2012   KNEE ARTHROPLASTY     KNEE ARTHROSCOPY  08/06/2012   Procedure: ARTHROSCOPY KNEE;  Surgeon: Dempsey LULLA Moan, MD;  Location: [facility];  Service: Orthopedics;  Laterality: Left;  WITH SYNOVECTOMY and debridement   OSTEOTOMY     bilateral tibial    Allergies  Allergen Reactions   Flexeril  [Cyclobenzaprine ]  Other (See Comments)    Chest pains/arrhythmia    BP 108/70   Ht 5' 5 (1.651 m)   Wt 128 lb (58.1 kg)   BMI 21.30 kg/m      03/24/2020    3:16 PM 04/06/2020    1:36 PM 06/21/2020   11:15 AM  [facility] Exercise  Frequency of aerobic exercise (# of days/week) 4 4 4   Average time in minutes 30 30 40  Frequency of strengthening activities (# of days/week) 2 2 2         No data to display              Objective:  Physical Exam:  Gen: NAD, comfortable in exam room  Left hip No deformity on inspection She maintains full range of motion 4/5 strength with resisted abduction 5/5 strength with flexion 5/5 strength with abduction Mild tenderness palpation over gluteus medius.   No TTP over the greater trochanter. Neurovascularly intact distally. Negative logroll Negative faber and fadir  Assessment and Plan:  Strain of left gluteus medius Pain is overall improving.  She still has some residual tenderness to palpation and with resisted abduction.  She elected to proceed with an additional session of shockwave.  See procedure documentation below.  We will tentatively plan for shockwave session #4 in 1 week.  She can cancel this appointment if pain completely resolves.  We discussed the importance of maintaining her home exercise program  2.  Chronic thoracic back pain Known history of diffuse chronic back pain in the setting of multilevel fusion.  She had an adverse reaction to Flexeril .  We discussed additional options for symptom improvement and recommended retrialing diazepam  5 mg daily.  New prescription sent. We reviewed the importance of not taking this concomitantly with Ambien  or Robaxin .  She will follow-up in 1 week as noted above.   Procedure: ECSWT Indications: Left gluteus medius strain   Procedure Details Consent: Risks of procedure as well as the alternatives and risks of each were explained to the patient.  Written consent for procedure  obtained. Time Out: Verified patient identification, verified procedure, site was marked, verified correct patient position, medications/allergies/relevent history reviewed.  The area was cleaned with alcohol  swab.     The left gluteus medius was targeted for Extracorporeal shockwave therapy.    Preset: Greater trochanter bursitis Power Level: 80 mJ Frequency: 12 Hz Impulse/cycles: 2000 Head size: Large   Patient tolerated procedure well without immediate complications

## 2024-05-21 ENCOUNTER — Ambulatory Visit (INDEPENDENT_AMBULATORY_CARE_PROVIDER_SITE_OTHER): Payer: Self-pay | Admitting: Sports Medicine

## 2024-05-21 ENCOUNTER — Encounter: Payer: Self-pay | Admitting: Sports Medicine

## 2024-05-21 DIAGNOSIS — S76012D Strain of muscle, fascia and tendon of left hip, subsequent encounter: Secondary | ICD-10-CM

## 2024-05-21 NOTE — Progress Notes (Signed)
   FOLLOW-UP VISIT:  Shockwave Therapy Procedure   NAME:  Nicole Oneill DOB:  03/29/1961 MR#: [id number] DATE OF VISIT:  05/21/2024  Encounter:   Nicole Oneill comes in for a follow up evaluation and Radial Extracorporeal Shockwave therapy (ESWT) treatment to the  left gluteus medius.  Nicole Oneill reports moderate interval change in symptoms.  She has remains active with /home exercises PT.   Last evaluated on 11/6.  She endorsed diffuse, chronic thoracic back pain in the setting of multilevel fusion.  We reviewed additional options for pain relief and ultimately she was prescribed diazepam  5 mg daily.  She reports that she took one tablet and pain significantly improved by the following day.  She has not taken any additional tablets.  She continues to take Ambien  nightly but is hoping to stop in the near future and would like to discuss alternatives to help with management of insomnia.   ESWT Procedure Note: Treatment #4  Diagnosis: Left gluteus medius strain  Procedure Details  Consent for the procedure was obtained. Time out was performed. The anatomical landmarks and target areas for the procedure were identified.   PROCEDURE: ESWT was discussed with the patient in detail.  The risk and benefits were explained.  The point of maximal tenderness was identified and the oscillator probe was applied with moderate pressure. Treatment adjusted as mediated by patient feedback/pain.  Left gluteus medius was targeted for ESWT  Preset: Greater trochanter bursitis Power level: 80 MJ Frequency: 12 hz Impulses: 2000 Head size: Large   Complications:  None; patient tolerated the procedure well.  Plan:   ESWT completed today as noted above She will follow-up as needed at this point Procedure aftercare reviewed Recommended avoiding strenuous activities and using OTC analgesia prn. Regarding insomnia, she specifically mentioned Ramelteon. We will look at coverage for this. Titrating off Ambien  and taking  diazepam  nightly instead would also be a reasonable option.

## 2024-06-18 ENCOUNTER — Ambulatory Visit (INDEPENDENT_AMBULATORY_CARE_PROVIDER_SITE_OTHER): Admitting: Sports Medicine

## 2024-06-18 VITALS — BP 118/72 | Ht 65.0 in | Wt 128.0 lb

## 2024-06-18 DIAGNOSIS — M79671 Pain in right foot: Secondary | ICD-10-CM | POA: Diagnosis not present

## 2024-06-18 DIAGNOSIS — S76311A Strain of muscle, fascia and tendon of the posterior muscle group at thigh level, right thigh, initial encounter: Secondary | ICD-10-CM

## 2024-06-18 DIAGNOSIS — G5701 Lesion of sciatic nerve, right lower limb: Secondary | ICD-10-CM | POA: Insufficient documentation

## 2024-06-18 MED ORDER — METHYLPREDNISOLONE ACETATE 40 MG/ML IJ SUSP
40.0000 mg | Freq: Once | INTRAMUSCULAR | Status: AC
  Administered 2024-06-18: 40 mg via INTRA_ARTICULAR

## 2024-06-18 NOTE — Assessment & Plan Note (Signed)
 We decided to try CSI to give her more relief  Procedure:  Injection of RT piriformis Consent obtained and verified. Time-out conducted. Noted no overlying erythema, induration, or other signs of local infection. Skin prepped in a sterile fashion. Topical analgesic spray: Ethyl chloride. Completed without difficulty. Meds: 1 cc soumedrol 40 and 4 cc lidocaine 1 % Advised to call if fevers/chills, erythema, induration, drainage, or persistent bleeding.  I advised limiting activity and pilates for the next 6 days OK to walk Resume pilates after that Resume some hip strengthening exercise and follow if problems persist

## 2024-06-18 NOTE — Assessment & Plan Note (Signed)
 I added some scaphoid padding to her RT custom orthotic She needs good support and orthotics look in good shape We will use this and see if this improves over the next month

## 2024-06-18 NOTE — Progress Notes (Signed)
 Discussed the use of AI scribe software for clinical note transcription with the patient, who gave verbal consent to proceed.  History of Present Illness Nicole Oneill is a 63 year old female with a history of spinal fusion who presents with back and foot pain.  Low back pain - Onset around Thanksgiving after lifting a heavy piece of coral - Initially localized at the coccyx, now migrated to the right buttocks - Pain worsens with prolonged sitting, especially as the day progresses - Uses ice and has restarted meloxicam  for symptom relief  Groin and lateral hip pain - Pain present in the groin and lateral hip regions - No specific inciting event described HX of gluteus medius syndrome  Foot pain consistent with plantar fasciitis - Pain in the foot similar to prior episodes of plantar fasciitis - Worst in the morning, making ambulation difficult - Partial relief with rolling foot on a frozen water bottle and use of custom orthotic supports  History of spinal fusion and chronic spinal pain - Spinal fusion from cervical spine to S1 - Severe spinal pain occasionally requires Valium , used about once a month for muscle spasms with good effect - Currently takes gabapentin  600 mg at night (reduced from 900 mg) and avoids daytime dosing due to drowsiness - Takes half an Ambien  at night for sleep  Physical Exam [name] F in NAD BP 118/72   Ht 5' 5 (1.651 m)   Wt 128 lb (58.1 kg)   BMI 21.30 kg/m   EXTREMITIES: Pronation more on right foot than left. Right foot slightly wider.  Shorter first metatarsal with Morton's foot on right.  Mild tenderness over plantar fascia insertion. Plantar flexion and inversion of foot do not cause pain.  MUSCULOSKELETAL: Right hip rotation good but less IR than on left  More motion at right SI joint than left.  Figure of four on right normal, not hypermobile. Figure of four on left tighter than right.  Straight leg raise on right okay.   Straight leg raise on left okay.  Very limited lateral bend of trunk  Good strength on toe walking. Good strength on heel walking.  TTP directly over piriformis muscle RT buttocks /None over coccyx

## 2024-07-06 ENCOUNTER — Other Ambulatory Visit: Payer: Self-pay | Admitting: Rheumatology

## 2024-07-06 NOTE — Telephone Encounter (Signed)
 Last Fill: 04/09/2024  Next Visit: 09/18/2024  Last Visit: 02/12/2024  DX: DDD (degenerative disc disease), thoracic   Current Dose per office note on 02/12/2024: dose not mentioned.   Okay to refill robaxin ?

## 2024-07-16 ENCOUNTER — Encounter: Payer: Self-pay | Admitting: Family Medicine

## 2024-07-16 ENCOUNTER — Ambulatory Visit: Admitting: Family Medicine

## 2024-07-16 VITALS — BP 108/72 | Ht 65.5 in | Wt 128.0 lb

## 2024-07-16 DIAGNOSIS — G5701 Lesion of sciatic nerve, right lower limb: Secondary | ICD-10-CM | POA: Diagnosis not present

## 2024-07-16 MED ORDER — PREDNISONE 10 MG PO TABS: ORAL_TABLET | ORAL | 0 refills | Status: AC

## 2024-07-16 NOTE — Assessment & Plan Note (Addendum)
 Right piriformis syndrome/pain initially improved with piriformis injection performed on 12/11 by Dr. Harvey, however due to deconditioning with her recent illnesses of norovirus and flu her symptoms were reaggravated.  She is slowly getting back into her normal activity with Pilates and walking, which we have advised would help mediate her symptoms. - Home exercises and stretching demonstrated and provided today - Continue Pilates and walking to to avoid prolonged sitting - Discussed treatment options including 6-day prednisone  taper to calm down inflammation.  She has still been having some intermittent stomach upset related to her recent illness and completed a course of antibiotics a few days ago.  Can consider using the prednisone  if symptoms persist over the next few days.  Advised should take with food. - Advised OTC Voltaren gel, and Tylenol  as needed for pain - Follow-up as needed if symptoms continue to be aggravated without much relief to consider shockwave therapy or repeat injection when able since this previously helped

## 2024-07-16 NOTE — Progress Notes (Signed)
 "  PCP: Shayne Anes, MD  Patient is a 64 y.o. female here for R piriformis.  HPI Patient was seen 06/2024 for this pain that originated at her coccyx and then radiated to her right buttock.  At that time it was worsened with prolonged sitting, especially as a day progressed.  She used ice and restarted meloxicam  for symptom relief.  I right piriformis injection was performed after obtaining consent at her last visit without any issues.  Patient was advised to limit activity and Pilates for the next week.  Over the past couple weeks she has been ill with norovirus and the flu therefore she has been more sitting more often than active, which reaggregated her symptoms.  Did Pilates after she got the injection and it felt fine, however took a break for 3 weeks over the holidays and her illness so her symptoms were worsened after Pilates yesterday.   Nicole Oneill has been taking Tylenol  as needed, but has avoided Mobic  because of her stomach.  Also has tried heat and ice therapy along with stretching to help temporarily.   Aggravated by sitting, laying in bed for >5-6 hours, driving, and Pilates.  Has not done PT or done any strengthening exercises such as weight lifting since before Thanksgiving.  Past Medical History:  Diagnosis Date   Anemia    Arthritis    osteo   Atrial tachycardia    x 10 years ago wias diagnosed with short runs of atrial tachycardia while wearing a heart monitor.  No problems since.   CAD (coronary artery disease)    noted on coronary CTA in 05/2019   Chronic dyspnea    Complication of anesthesia    urinary retention requiring indwelling  foley catheterization   DDD (degenerative disc disease)    Ehlers-Danlos syndrome type III    Fibromyalgia    History of blood transfusion    Hyperlipidemia    Hypertension    Osteoarthritis    Sleep apnea, obstructive    Thoracic outlet syndrome 07/2020   per patient     Medications Ordered Prior to Encounter[1]  Past Surgical  History:  Procedure Laterality Date   BACK SURGERY     Cervical fusion C5-7, fusion  T10-S1   DIAGNOSTIC LAPAROSCOPY     x 3   EYE SURGERY     bilateral tear duct fusion   EYE SURGERY Bilateral 03/19/2023   eyelid surgery and cyst removed from left tearduct   JOINT REPLACEMENT     partial left knee 2005, total left knee 2012   KNEE ARTHROPLASTY     KNEE ARTHROSCOPY  08/06/2012   Procedure: ARTHROSCOPY KNEE;  Surgeon: Dempsey LULLA Moan, MD;  Location: [facility];  Service: Orthopedics;  Laterality: Left;  WITH SYNOVECTOMY and debridement   OSTEOTOMY     bilateral tibial    Allergies[2]  BP 108/72   Ht 5' 5.5 (1.664 m)   Wt 128 lb (58.1 kg)   BMI 20.98 kg/m      03/24/2020    3:16 PM 04/06/2020    1:36 PM 06/21/2020   11:15 AM  [facility] Adult Exercise  Frequency of aerobic exercise (# of days/week) 4 4 4   Average time in minutes 30 30 40  Frequency of strengthening activities (# of days/week) 2 2 2         No data to display              Objective:  Physical Exam:  R piriformis /  bilateral hips Gen: NAD, comfortable in exam room MSK Inspection: Mild gait asymmetry w/ favoring the L.  Palpation: TTP over R piriformis associated hypertonia.  No pain over L piriformis.  No SIJ pain bilaterally.  No significant tenderness over the greater trochanters bilaterally. ROM: Full flexion, extension, IR, ER at hips with minimal discomfort on the R>L. Special Tests: Negative SLR bilaterally.   Strength: 5-/5 strength in RLE and 5/5 strength in LLE.  Normal great toe strength bilaterally.  Able to toe walk and heel walk. NVI  Assessment and Plan:   Assessment & Plan Piriformis syndrome of right side Right piriformis syndrome/pain initially improved with piriformis injection performed on 12/11 by Dr. Harvey, however due to deconditioning with her recent illnesses of norovirus and flu her symptoms were reaggravated.  She is slowly getting back into her normal  activity with Pilates and walking, which we have advised would help mediate her symptoms. - Home exercises and stretching demonstrated and provided today - Continue Pilates and walking to to avoid prolonged sitting - Discussed treatment options including 6-day prednisone  taper to calm down inflammation.  She has still been having some intermittent stomach upset related to her recent illness and completed a course of antibiotics a few days ago.  Can consider using the prednisone  if symptoms persist over the next few days.  Advised should take with food. - Advised OTC Voltaren gel, and Tylenol  as needed for pain - Follow-up as needed if symptoms continue to be aggravated without much relief to consider shockwave therapy or repeat injection when able since this previously helped  Nicole Melena, DO [facility]      [1]  Current Outpatient Medications on File Prior to Visit  Medication Sig Dispense Refill   amLODipine  (NORVASC ) 2.5 MG tablet Take 1 tablet (2.5 mg total) by mouth daily. 90 tablet 3   amphetamine-dextroamphetamine (ADDERALL) 5 MG tablet TAKE 1 TABLET BY MOUTH EVERY DAY AS NEEDED FOR ATTENTION     atorvastatin  (LIPITOR) 10 MG tablet Take 1 tablet (10 mg total) by mouth daily. 90 tablet 3   Calcium  Carbonate-Vitamin D  (CALTRATE 600+D PO) Take 1 tablet by mouth daily.  (Patient not taking: Reported on 02/27/2024)     Cholecalciferol  (VITAMIN D3) 2000 units capsule Take 2,000 Units by mouth daily.     dextroamphetamine (DEXTROSTAT) 5 MG tablet Take 5 mg by mouth as needed.     diazepam  (VALIUM ) 5 MG tablet Take 1 tablet (5 mg total) by mouth at bedtime. 30 tablet 1   doxycycline (VIBRA-TABS) 100 MG tablet Take 100 mg by mouth as needed.     estradiol (ESTRACE) 0.1 MG/GM vaginal cream Place 1 Applicatorful vaginally at bedtime.     Famotidine (PEPCID PO) Take by mouth as needed.     gabapentin  (NEURONTIN ) 300 MG capsule TAKE ONE CAPSULE BY MOUTH EVERY MORNING, 1 CAPSULE AT  MIDDAY THEN 2 CAPSULES AT BEDTIME 360 capsule 3   MAGNESIUM PO Take by mouth daily.     meloxicam  (MOBIC ) 15 MG tablet Take 1 tablet (15 mg total) by mouth daily. 30 tablet 5   methocarbamol  (ROBAXIN ) 750 MG tablet TAKE 1 TABLET (750 MG TOTAL) BY MOUTH EVERY DAY AT BEDTIME AS NEEDED FOR MUSCLE SPASM 30 tablet 2   MIEBO 1.338 GM/ML SOLN Apply 1 drop to eye 2 (two) times daily.     Polyethyl Glycol-Propyl Glycol 0.4-0.3 % SOLN Apply to eye.     Probiotic Product (PROBIOTIC PO) Take 1 capsule by mouth  daily.     Simethicone (GAS-X PO) as needed.     zolpidem  (AMBIEN ) 5 MG tablet Take 1 tablet (5 mg total) by mouth at bedtime as needed for sleep. 30 tablet 0   No current facility-administered medications on file prior to visit.  [2]  Allergies Allergen Reactions   Flexeril  [Cyclobenzaprine ] Other (See Comments)    Chest pains/arrhythmia   "

## 2024-08-14 ENCOUNTER — Ambulatory Visit: Admitting: Rheumatology

## 2024-09-18 ENCOUNTER — Ambulatory Visit: Admitting: Rheumatology
